# Patient Record
Sex: Female | Born: 1940
Health system: Southern US, Community
[De-identification: ages and names within clinical notes are randomized; demographics above are authoritative.]

## PROBLEM LIST (undated history)

## (undated) DIAGNOSIS — I10 Essential (primary) hypertension: Secondary | ICD-10-CM

## (undated) DIAGNOSIS — B029 Zoster without complications: Secondary | ICD-10-CM

## (undated) DIAGNOSIS — F329 Major depressive disorder, single episode, unspecified: Secondary | ICD-10-CM

## (undated) DIAGNOSIS — F419 Anxiety disorder, unspecified: Secondary | ICD-10-CM

## (undated) DIAGNOSIS — I700 Atherosclerosis of aorta: Secondary | ICD-10-CM

## (undated) DIAGNOSIS — F0390 Unspecified dementia without behavioral disturbance: Secondary | ICD-10-CM

## (undated) DIAGNOSIS — D649 Anemia, unspecified: Secondary | ICD-10-CM

## (undated) DIAGNOSIS — Z8701 Personal history of pneumonia (recurrent): Secondary | ICD-10-CM

## (undated) DIAGNOSIS — R079 Chest pain, unspecified: Secondary | ICD-10-CM

## (undated) DIAGNOSIS — F32A Depression, unspecified: Secondary | ICD-10-CM

## (undated) DIAGNOSIS — K219 Gastro-esophageal reflux disease without esophagitis: Secondary | ICD-10-CM

## (undated) DIAGNOSIS — J42 Unspecified chronic bronchitis: Secondary | ICD-10-CM

## (undated) DIAGNOSIS — M1990 Unspecified osteoarthritis, unspecified site: Secondary | ICD-10-CM

## (undated) DIAGNOSIS — E785 Hyperlipidemia, unspecified: Secondary | ICD-10-CM

## (undated) DIAGNOSIS — Z6841 Body Mass Index (BMI) 40.0 and over, adult: Secondary | ICD-10-CM

## (undated) HISTORY — DX: Body Mass Index (BMI) 40.0 and over, adult: Z684

## (undated) HISTORY — DX: Morbid (severe) obesity due to excess calories: E66.01

## (undated) HISTORY — PX: COLONOSCOPY: SHX5424

## (undated) HISTORY — PX: CATARACT EXTRACTION, BILATERAL: SHX1313

## (undated) HISTORY — PX: CARPAL TUNNEL RELEASE: SHX101

## (undated) HISTORY — PX: OTHER SURGICAL HISTORY: SHX169

## (undated) HISTORY — DX: Personal history of pneumonia (recurrent): Z87.01

## (undated) HISTORY — PX: TUBAL LIGATION: SHX77

## (undated) HISTORY — PX: WISDOM TOOTH EXTRACTION: SHX21

## (undated) SURGERY — Surgical Case
Anesthesia: *Unknown

---

## 1995-04-14 HISTORY — PX: OTHER SURGICAL HISTORY: SHX169

## 1996-04-20 HISTORY — PX: CARDIAC CATHETERIZATION: SHX172

## 1998-11-04 ENCOUNTER — Ambulatory Visit (HOSPITAL_COMMUNITY): Admission: RE | Admit: 1998-11-04 | Discharge: 1998-11-04 | Payer: Self-pay | Admitting: Emergency Medicine

## 1998-11-04 ENCOUNTER — Encounter: Payer: Self-pay | Admitting: Emergency Medicine

## 1998-11-11 ENCOUNTER — Ambulatory Visit (HOSPITAL_COMMUNITY): Admission: RE | Admit: 1998-11-11 | Discharge: 1998-11-11 | Payer: Self-pay | Admitting: Emergency Medicine

## 1998-11-11 ENCOUNTER — Encounter: Payer: Self-pay | Admitting: Emergency Medicine

## 1999-11-12 ENCOUNTER — Encounter: Payer: Self-pay | Admitting: *Deleted

## 1999-11-12 ENCOUNTER — Ambulatory Visit (HOSPITAL_COMMUNITY): Admission: RE | Admit: 1999-11-12 | Discharge: 1999-11-12 | Payer: Self-pay | Admitting: *Deleted

## 2000-11-12 ENCOUNTER — Encounter: Payer: Self-pay | Admitting: Emergency Medicine

## 2000-11-12 ENCOUNTER — Ambulatory Visit (HOSPITAL_COMMUNITY): Admission: RE | Admit: 2000-11-12 | Discharge: 2000-11-12 | Payer: Self-pay | Admitting: Emergency Medicine

## 2001-02-16 ENCOUNTER — Ambulatory Visit (HOSPITAL_COMMUNITY): Admission: RE | Admit: 2001-02-16 | Discharge: 2001-02-16 | Payer: Self-pay | Admitting: *Deleted

## 2001-04-13 DIAGNOSIS — Z8701 Personal history of pneumonia (recurrent): Secondary | ICD-10-CM

## 2001-04-13 HISTORY — DX: Personal history of pneumonia (recurrent): Z87.01

## 2001-11-11 HISTORY — PX: KNEE SURGERY: SHX244

## 2001-11-16 ENCOUNTER — Ambulatory Visit (HOSPITAL_BASED_OUTPATIENT_CLINIC_OR_DEPARTMENT_OTHER): Admission: RE | Admit: 2001-11-16 | Discharge: 2001-11-16 | Payer: Self-pay | Admitting: Orthopedic Surgery

## 2003-03-09 ENCOUNTER — Ambulatory Visit (HOSPITAL_COMMUNITY): Admission: RE | Admit: 2003-03-09 | Discharge: 2003-03-09 | Payer: Self-pay | Admitting: Emergency Medicine

## 2005-03-02 ENCOUNTER — Emergency Department (HOSPITAL_COMMUNITY): Admission: EM | Admit: 2005-03-02 | Discharge: 2005-03-02 | Payer: Self-pay | Admitting: Emergency Medicine

## 2008-05-23 ENCOUNTER — Emergency Department (HOSPITAL_COMMUNITY): Admission: EM | Admit: 2008-05-23 | Discharge: 2008-05-23 | Payer: Self-pay | Admitting: Emergency Medicine

## 2008-09-12 ENCOUNTER — Ambulatory Visit: Payer: Self-pay | Admitting: Vascular Surgery

## 2009-04-30 ENCOUNTER — Ambulatory Visit (HOSPITAL_COMMUNITY): Admission: RE | Admit: 2009-04-30 | Discharge: 2009-04-30 | Payer: Self-pay | Admitting: Emergency Medicine

## 2009-07-22 ENCOUNTER — Encounter: Admission: RE | Admit: 2009-07-22 | Discharge: 2009-07-22 | Payer: Self-pay | Admitting: Emergency Medicine

## 2009-10-11 HISTORY — PX: REPLACEMENT TOTAL KNEE: SUR1224

## 2009-11-06 ENCOUNTER — Inpatient Hospital Stay (HOSPITAL_COMMUNITY): Admission: RE | Admit: 2009-11-06 | Discharge: 2009-11-11 | Payer: Self-pay | Admitting: Orthopedic Surgery

## 2010-05-01 ENCOUNTER — Ambulatory Visit (HOSPITAL_COMMUNITY)
Admission: RE | Admit: 2010-05-01 | Discharge: 2010-05-01 | Payer: Self-pay | Source: Home / Self Care | Attending: Emergency Medicine | Admitting: Emergency Medicine

## 2010-06-27 LAB — PROTIME-INR: Prothrombin Time: 23.9 seconds — ABNORMAL HIGH (ref 11.6–15.2)

## 2010-06-28 LAB — PROTIME-INR
INR: 1.07 (ref 0.00–1.49)
INR: 1.6 — ABNORMAL HIGH (ref 0.00–1.49)
INR: 1.61 — ABNORMAL HIGH (ref 0.00–1.49)
INR: 2 — ABNORMAL HIGH (ref 0.00–1.49)
Prothrombin Time: 13.8 seconds (ref 11.6–15.2)
Prothrombin Time: 18.9 seconds — ABNORMAL HIGH (ref 11.6–15.2)
Prothrombin Time: 19 seconds — ABNORMAL HIGH (ref 11.6–15.2)

## 2010-06-28 LAB — BASIC METABOLIC PANEL
BUN: 11 mg/dL (ref 6–23)
CO2: 26 mEq/L (ref 19–32)
CO2: 30 mEq/L (ref 19–32)
CO2: 33 mEq/L — ABNORMAL HIGH (ref 19–32)
Calcium: 7.3 mg/dL — ABNORMAL LOW (ref 8.4–10.5)
Calcium: 8 mg/dL — ABNORMAL LOW (ref 8.4–10.5)
Chloride: 102 mEq/L (ref 96–112)
Creatinine, Ser: 0.76 mg/dL (ref 0.4–1.2)
GFR calc Af Amer: >60 mL/min (ref >60)
GFR calc Af Amer: >60 mL/min (ref >60)
GFR calc Af Amer: >60 mL/min (ref >60)
GFR calc non Af Amer: >60 mL/min (ref >60)
GFR calc non Af Amer: >60 mL/min (ref >60)
Glucose, Bld: 116 mg/dL — ABNORMAL HIGH (ref 70–99)
Glucose, Bld: 557 mg/dL (ref 70–99)
Potassium: 3.6 mEq/L (ref 3.5–5.1)
Potassium: 3.9 mEq/L (ref 3.5–5.1)
Sodium: 130 mEq/L — ABNORMAL LOW (ref 135–145)
Sodium: 137 mEq/L (ref 135–145)
Sodium: 140 mEq/L (ref 135–145)

## 2010-06-28 LAB — DIFFERENTIAL
Basophils Absolute: 0.1 10*3/uL (ref 0.0–0.1)
Basophils Relative: 1 % (ref 0–1)
Eosinophils Absolute: 0.2 10*3/uL (ref 0.0–0.7)
Eosinophils Relative: 2 % (ref 0–5)
Neutro Abs: 7.5 10*3/uL (ref 1.7–7.7)
Neutrophils Relative %: 66 % (ref 43–77)

## 2010-06-28 LAB — CBC
HCT: 28.4 % — ABNORMAL LOW (ref 36.0–46.0)
HCT: 28.6 % — ABNORMAL LOW (ref 36.0–46.0)
Hemoglobin: 12.5 g/dL (ref 12.0–15.0)
Hemoglobin: 9.5 g/dL — ABNORMAL LOW (ref 12.0–15.0)
MCH: 29.7 pg (ref 26.0–34.0)
MCH: 29.9 pg (ref 26.0–34.0)
MCH: 29.9 pg (ref 26.0–34.0)
MCH: 30 pg (ref 26.0–34.0)
MCV: 89.9 fL (ref 78.0–100.0)
MCV: 90.4 fL (ref 78.0–100.0)
MCV: 90.4 fL (ref 78.0–100.0)
Platelets: 206 10*3/uL (ref 150–400)
Platelets: 311 10*3/uL (ref 150–400)
RBC: 3.16 MIL/uL — ABNORMAL LOW (ref 3.87–5.11)
RBC: 3.18 MIL/uL — ABNORMAL LOW (ref 3.87–5.11)
RDW: 14.8 % (ref 11.5–15.5)
RDW: 15.4 % (ref 11.5–15.5)
WBC: 13.5 10*3/uL — ABNORMAL HIGH (ref 4.0–10.5)

## 2010-06-28 LAB — COMPREHENSIVE METABOLIC PANEL
ALT: 15 U/L (ref 0–35)
Albumin: 4 g/dL (ref 3.5–5.2)
CO2: 30 mEq/L (ref 19–32)
Calcium: 10.1 mg/dL (ref 8.4–10.5)
Chloride: 99 mEq/L (ref 96–112)
GFR calc Af Amer: >60 mL/min (ref >60)
Potassium: 4.1 mEq/L (ref 3.5–5.1)
Sodium: 140 mEq/L (ref 135–145)

## 2010-06-28 LAB — URINALYSIS, ROUTINE W REFLEX MICROSCOPIC
Glucose, UA: NEGATIVE mg/dL
Nitrite: NEGATIVE
Protein, ur: NEGATIVE mg/dL
pH: 8.5 — ABNORMAL HIGH (ref 5.0–8.0)

## 2010-06-28 LAB — ABO/RH: ABO/RH(D): O POS

## 2010-06-28 LAB — GLUCOSE, CAPILLARY
Glucose-Capillary: 106 mg/dL — ABNORMAL HIGH (ref 70–99)
Glucose-Capillary: 116 mg/dL — ABNORMAL HIGH (ref 70–99)
Glucose-Capillary: 123 mg/dL — ABNORMAL HIGH (ref 70–99)

## 2010-06-28 LAB — APTT: aPTT: 28 seconds (ref 24–37)

## 2010-06-28 LAB — TYPE AND SCREEN: ABO/RH(D): O POS

## 2010-06-28 LAB — SURGICAL PCR SCREEN: Staphylococcus aureus: NEGATIVE

## 2010-08-26 NOTE — Consult Note (Signed)
NEW PATIENT CONSULTATION   [name], [name]  DOB:  1940/10/21                                       09/12/2008  EAVWU#:[id number]   [name] presents today for evaluation of lower extremity  discomfort.  She is a very pleasant 70 year old black female who reports  discomfort both lower extremities.  She reports that she has a numbness  and stinging. This began in her right hip extending down into her legs  and also both anterior thighs and also over her right lateral calf.  She  has seen Dr. [name] before with diagnosis of a sciatic irritation  in her right leg.  She does not have any history of deep venous  thrombosis and does not have any venous varicosities.  She has a few  scattered spider vein telangiectasia.  She does have some swelling at  night.  She reports that this can be positional and have to __________  at night due to numbness in both legs.   PAST MEDICAL HISTORY:  Her past history is significant for hypertension,  elevated cholesterol.  She is not diabetic.   SOCIAL HISTORY:  She is widowed.  She has six children.  She smokes two  cigarettes a day and does not drink alcohol.   REVIEW OF SYSTEMS:  Her weight is reported at 269 pounds.  She is 5 feet  tall.  She denies cardiac or pulmonary difficulties.  She does have  chronic abdominal pain and arthritic joint pain.   ALLERGIES TO MEDICATIONS:  None.   CURRENT MEDICATIONS:  1. Pravastatin.  2. Metoprolol.  3. Lisinopril.  4. Furosemide.  5. Iron tablets.  6. Aspirin 81 mg daily.   PHYSICAL EXAMINATION:  GENERAL:  A well-developed, obese black female  appearing stated age of 13.  Her radial and posterior tibial pulses are 2+ bilaterally.  She does not  have any evidence of varicose veins.  Her legs are quite hard due to her  obesity.   RADIOGRAPHIC:  She underwent a screening handheld duplex by myself and  this shows normal saphenous veins bilaterally with no evidence of  reflux.   ASSESSMENT AND PLAN:  I discussed this with [name].  She does not  appear to have any significant venous to pathology.  I explained that  her symptoms do not go well with venous or arterial pathology and it  appears to be more than neuropathic.  She has tried compression garments  in the past but admits they have been difficult to for her to wear and  since she does not have any evidence of significant reflux, I would not  recommend persistence with this.  She was reassured with the discussion  and will see [place] again on an as-needed basis   Larina Earthly, M.D.  Electronically Signed   TFE/MEDQ  D:  09/12/2008  T:  09/13/2008  Job:  [id number]   cc:   Brett Canales A. Cleta Alberts, M.D.  Harvie Junior, M.D.

## 2010-08-29 NOTE — Op Note (Signed)
NAME:  [name], [name] NO.:  [id number]   MEDICAL RECORD NO.:  [id number]                    PATIENT TYPE:   LOCATION:                                       FACILITY:   PHYSICIAN:  Harvie Junior, M.D.                DATE OF BIRTH:   DATE OF PROCEDURE:  11/16/2001  DATE OF DISCHARGE:                                 OPERATIVE REPORT   PREOPERATIVE DIAGNOSES:  Degenerative joint disease with medial side pain.   POSTOPERATIVE DIAGNOSES:  1. Degenerative joint disease with medial side pain.  2. Anterior horn lateral meniscal tear.  3. Degenerative joint disease of the patellofemoral joint.   PRINCIPAL PROCEDURE:  1. Partial lateral meniscectomy.  2. Debridement of medial femoral condyle.  3. Debridement of patella and femoral trochlea.   SURGEON:  Dr. Luiz Blare.   ASSISTANT:  Currie Paris. Thedore Mins.   ANESTHESIA:  General.   BRIEF HISTORY:  This is a 70 year old female with a long history of having  had significant degenerative joint disease. She previously had a knee  arthroscopy which showed that she had some degenerative disease. She  ultimately continued to have pain. We did injection therapy and that helped  but did not eliminate her pain. Because of continued complaints of pain, the  patient was ultimately taken to the operating room for operative knee  arthroscopy.   DESCRIPTION OF PROCEDURE:  The patient was taken to the operating room and  after adequate anesthesia was obtained with general anesthetic, the patient  was placed supine on the operating table. The leg was examined under  anesthesia and noted to be stable in all directions. At this point, the leg  was prepped and draped in the usual sterile fashion. Routine orthopedic  examination of the knee revealed that there was an obvious chondral defect  on the medial femoral condyle. This was debrided back to a smooth and stable  rim. It was a grade 4 defect with significant amounts of  exposed bone.  Attention then turned to the medial meniscus which was  without significant  abnormality. Attention was then turned to the anterior cruciate which was  normal. Attention was turned laterally. There was a small anterolateral  meniscal tear but the lateral femoral condyle was within normal limits.  Attention was then turned to the patellofemoral joint where there was noted  to be grade 3 and 4 changes throughout the femoral trochlea as well as on  the undersurface of the patella. The trochlear lesion did communicate with  the medial femoral condylar lesion thus making it sort of  a large grade 4  area. At this point following debridement of all these surfaces, the knee  was copiously irrigated and suctioned dry. A final check was made for any  loose or fragmenting pieces finding  none. The knee was then injected with 80 mg of Depo-Medrol and  the portals  were closed with a 4-0 nylon interrupted suture. A sterile compressive  dressing was applied, the patient taken to the recovery room where she was  noted to be in satisfactory condition.                                                  Harvie Junior, M.D.    Ranae Plumber  D:  11/16/2001  T:  11/19/2001  Job:  [id number]

## 2010-10-16 ENCOUNTER — Other Ambulatory Visit: Payer: Self-pay | Admitting: Orthopedic Surgery

## 2010-10-16 DIAGNOSIS — M25512 Pain in left shoulder: Secondary | ICD-10-CM

## 2010-10-16 DIAGNOSIS — M25511 Pain in right shoulder: Secondary | ICD-10-CM

## 2010-10-20 ENCOUNTER — Ambulatory Visit
Admission: RE | Admit: 2010-10-20 | Discharge: 2010-10-20 | Disposition: A | Discharge status: Home / Self Care | Payer: Medicare Other | Source: Ambulatory Visit | Attending: Orthopedic Surgery | Admitting: Orthopedic Surgery

## 2010-10-20 DIAGNOSIS — M25511 Pain in right shoulder: Secondary | ICD-10-CM

## 2010-10-20 DIAGNOSIS — M25512 Pain in left shoulder: Secondary | ICD-10-CM

## 2011-03-24 ENCOUNTER — Encounter (HOSPITAL_COMMUNITY): Payer: Self-pay

## 2011-03-25 ENCOUNTER — Other Ambulatory Visit: Payer: Self-pay | Admitting: Obstetrics and Gynecology

## 2011-03-30 NOTE — Patient Instructions (Addendum)
   Your procedure is scheduled on: Thursday, Dec 20th  Enter through the [employer] of [facility] at: 11:30am Pick up the phone at the desk and dial [phone] and inform us of your arrival.  Please call this number if you have any problems the morning of surgery: [phone]  Remember: Do not eat food after midnight: Wednesday Do not drink clear liquids after: 9:00am Thursday Take these medicines the morning of surgery with a SIP OF WATER: per anesthesia instruction - prior to 9am  Do not wear jewelry, make-up, or FINGER nail polish Do not wear lotions, powders, perfumes or deodorant. Do not shave 48 hours prior to surgery. Do not bring valuables to the hospital.  Patients discharged on the day of surgery will not be allowed to drive home.   Home with Sister-in-law Tonna Boehringer   Remember to use your hibiclens as instructed.Please shower with 1/2 bottle the evening before your surgery and the other 1/2 bottle the morning of surgery.

## 2011-03-31 ENCOUNTER — Encounter (HOSPITAL_COMMUNITY): Payer: Self-pay

## 2011-03-31 ENCOUNTER — Other Ambulatory Visit: Payer: Self-pay

## 2011-03-31 ENCOUNTER — Encounter (HOSPITAL_COMMUNITY)
Admission: RE | Admit: 2011-03-31 | Discharge: 2011-03-31 | Disposition: A | Discharge status: Home / Self Care | Payer: MEDICARE | Source: Ambulatory Visit | Attending: Obstetrics and Gynecology | Admitting: Obstetrics and Gynecology

## 2011-03-31 HISTORY — DX: Anxiety disorder, unspecified: F41.9

## 2011-03-31 HISTORY — DX: Hyperlipidemia, unspecified: E78.5

## 2011-03-31 HISTORY — DX: Essential (primary) hypertension: I10

## 2011-03-31 HISTORY — DX: Depression, unspecified: F32.A

## 2011-03-31 HISTORY — DX: Unspecified osteoarthritis, unspecified site: M19.90

## 2011-03-31 HISTORY — DX: Anemia, unspecified: D64.9

## 2011-03-31 HISTORY — DX: Major depressive disorder, single episode, unspecified: F32.9

## 2011-03-31 LAB — CBC
HCT: 38.4 % (ref 36.0–46.0)
Platelets: 303 10*3/uL (ref 150–400)
RBC: 4.1 MIL/uL (ref 3.87–5.11)
RDW: 14.9 % (ref 11.5–15.5)
WBC: 8.8 10*3/uL (ref 4.0–10.5)

## 2011-03-31 LAB — COMPREHENSIVE METABOLIC PANEL
AST: 19 U/L (ref 0–37)
Albumin: 3.7 g/dL (ref 3.5–5.2)
Alkaline Phosphatase: 82 U/L (ref 39–117)
Chloride: 103 mEq/L (ref 96–112)
Potassium: 4.8 mEq/L (ref 3.5–5.1)
Total Bilirubin: 0.4 mg/dL (ref 0.3–1.2)

## 2011-03-31 NOTE — Pre-Procedure Instructions (Signed)
Reviewed patient's history with Dr Dana Allan. Per Dr Rodman Pickle, patient to take all morning meds on DOS (prior to 9am) with small sip of water except Iron pill. Patient verbalized understanding.

## 2011-04-02 NOTE — H&P (Signed)
[name], [name]                ACCOUNT NO.:  [id number]  MEDICAL RECORD NO.:  [id number]  LOCATION:  [facility]                         FACILITY:  [facility]  PHYSICIAN:  Lenoard Aden, M.D.DATE OF BIRTH:  02/08/1941  DATE OF ADMISSION:  03/03/2011 DATE OF DISCHARGE:                             HISTORY & PHYSICAL   CHIEF COMPLAINT:  Postmenopausal bleeding with structural lesion.  HISTORY OF PRESENT ILLNESS:  This is a 70 year old African American female, G7, P7, who presents now with postmenopausal bleeding and a polyp on sonohysterogram, consistent with an endometrial polyp.  She had a normal endometrial biopsy on January 23, 2011.  MEDICATIONS:  Include Cipro, lorazepam, tramadol, Lasix, Vytorin, Benicar, baby aspirin, metoprolol, calcium with vitamin D.  PAST SURGICAL HISTORY:  She has a history of 7 vaginal deliveries.  She has a history of knee surgery x2.  MEDICAL HISTORY:  Also, remarkable for hypertension.  FAMILY HISTORY:  Heart disease and hypertension.  PHYSICAL EXAMINATION:  GENERAL:  The patient is a well-developed, well- nourished [place] American female. VITAL SIGNS:  Height of 59 inches, weight of 223 pounds. HEENT:  Normal. NECK:  Supple.  Full range of motion. LUNGS:  Clear. HEART:  Regular rate and rhythm. ABDOMEN:  Soft, nontender. PELVIC:  Reveals a globular uterus which is antiflexed and no adnexal masses. EXTREMITIES:  There are no cords. NEUROLOGIC: Nonfocal. SKIN:  Intact.  IMPRESSION:  Postmenopausal bleeding with structural lesion.  PLAN:  Proceed with diagnostic hysteroscopy.  Risks of anesthesia, infection, bleeding, injury to abdominal organs, need for repair is discussed.  Delayed versus immediate complications to include bowel or bladder injury noted.  The patient acknowledges and wishes to proceed.     Lenoard Aden, M.D.     RJT/MEDQ  D:  04/02/2011  T:  04/02/2011  Job:  [id number]

## 2011-04-03 ENCOUNTER — Ambulatory Visit (HOSPITAL_COMMUNITY)
Admission: RE | Admit: 2011-04-03 | Discharge: 2011-04-03 | Disposition: A | Discharge status: Home / Self Care | Payer: MEDICARE | Source: Ambulatory Visit | Attending: Obstetrics and Gynecology | Admitting: Obstetrics and Gynecology

## 2011-04-03 ENCOUNTER — Encounter (HOSPITAL_COMMUNITY)
Admission: RE | Disposition: A | Discharge status: Home / Self Care | Payer: Self-pay | Source: Ambulatory Visit | Attending: Obstetrics and Gynecology

## 2011-04-03 ENCOUNTER — Other Ambulatory Visit: Payer: Self-pay | Admitting: Obstetrics and Gynecology

## 2011-04-03 ENCOUNTER — Encounter (HOSPITAL_COMMUNITY): Payer: Self-pay | Admitting: Anesthesiology

## 2011-04-03 ENCOUNTER — Ambulatory Visit (HOSPITAL_COMMUNITY): Payer: MEDICARE | Admitting: Anesthesiology

## 2011-04-03 DIAGNOSIS — N950 Postmenopausal bleeding: Secondary | ICD-10-CM

## 2011-04-03 DIAGNOSIS — N840 Polyp of corpus uteri: Secondary | ICD-10-CM | POA: Insufficient documentation

## 2011-04-03 SURGERY — DILATATION & CURETTAGE/HYSTEROSCOPY WITH VERSAPOINT RESECTION
Anesthesia: Choice

## 2011-04-03 MED ORDER — LIDOCAINE HCL (CARDIAC) 20 MG/ML IV SOLN
INTRAVENOUS | Status: AC
  Filled 2011-04-03: qty 5

## 2011-04-03 MED ORDER — FENTANYL CITRATE 0.05 MG/ML IJ SOLN
INTRAMUSCULAR | Status: AC
  Filled 2011-04-03: qty 4

## 2011-04-03 MED ORDER — LIDOCAINE HCL (CARDIAC) 20 MG/ML IV SOLN
INTRAVENOUS | Status: DC | PRN
  Administered 2011-04-03: 100 mg via INTRAVENOUS

## 2011-04-03 MED ORDER — PROPOFOL 10 MG/ML IV EMUL
INTRAVENOUS | Status: DC | PRN
  Administered 2011-04-03: 180 mg via INTRAVENOUS

## 2011-04-03 MED ORDER — ONDANSETRON HCL 4 MG/2ML IJ SOLN
INTRAMUSCULAR | Status: DC | PRN
  Administered 2011-04-03: 4 mg via INTRAVENOUS

## 2011-04-03 MED ORDER — DEXAMETHASONE SODIUM PHOSPHATE 10 MG/ML IJ SOLN
INTRAMUSCULAR | Status: AC
  Filled 2011-04-03: qty 1

## 2011-04-03 MED ORDER — FENTANYL CITRATE 0.05 MG/ML IJ SOLN
INTRAMUSCULAR | Status: DC | PRN
  Administered 2011-04-03: 25 ug via INTRAVENOUS
  Administered 2011-04-03: 50 ug via INTRAVENOUS
  Administered 2011-04-03: 25 ug via INTRAVENOUS
  Administered 2011-04-03: 50 ug via INTRAVENOUS

## 2011-04-03 MED ORDER — FENTANYL CITRATE 0.05 MG/ML IJ SOLN: 25.0000 ug | INTRAMUSCULAR | Status: DC | PRN

## 2011-04-03 MED ORDER — DEXAMETHASONE SODIUM PHOSPHATE 4 MG/ML IJ SOLN
INTRAMUSCULAR | Status: DC | PRN
  Administered 2011-04-03: 10 mg via INTRAVENOUS

## 2011-04-03 MED ORDER — KETOROLAC TROMETHAMINE 30 MG/ML IJ SOLN
INTRAMUSCULAR | Status: AC
  Filled 2011-04-03: qty 1

## 2011-04-03 MED ORDER — VASOPRESSIN 20 UNIT/ML IJ SOLN
INTRAVENOUS | Status: DC | PRN
  Administered 2011-04-03: 13:00:00 via INTRAMUSCULAR

## 2011-04-03 MED ORDER — MIDAZOLAM HCL 2 MG/2ML IJ SOLN
INTRAMUSCULAR | Status: AC
  Filled 2011-04-03: qty 2

## 2011-04-03 MED ORDER — KETOROLAC TROMETHAMINE 30 MG/ML IJ SOLN
INTRAMUSCULAR | Status: DC | PRN
  Administered 2011-04-03: 30 mg via INTRAVENOUS

## 2011-04-03 MED ORDER — ONDANSETRON HCL 4 MG/2ML IJ SOLN
INTRAMUSCULAR | Status: AC
  Filled 2011-04-03: qty 2

## 2011-04-03 MED ORDER — MIDAZOLAM HCL 5 MG/5ML IJ SOLN
INTRAMUSCULAR | Status: DC | PRN
  Administered 2011-04-03: 2 mg via INTRAVENOUS

## 2011-04-03 MED ORDER — PROPOFOL 10 MG/ML IV EMUL
INTRAVENOUS | Status: AC
  Filled 2011-04-03: qty 20

## 2011-04-03 MED ORDER — BUPIVACAINE HCL (PF) 0.25 % IJ SOLN
INTRAMUSCULAR | Status: DC | PRN
  Administered 2011-04-03: 10 mL

## 2011-04-03 MED ORDER — LACTATED RINGERS IV SOLN
INTRAVENOUS | Status: DC
  Administered 2011-04-03 (×3): via INTRAVENOUS

## 2011-04-03 SURGICAL SUPPLY — 12 items
CANISTER SUCTION 2500CC (MISCELLANEOUS) ×2 IMPLANT
CATH ROBINSON RED A/P 16FR (CATHETERS) ×2 IMPLANT
CLOTH BEACON ORANGE TIMEOUT ST (SAFETY) ×2 IMPLANT
CONTAINER PREFILL 10% NBF 60ML (FORM) ×4 IMPLANT
ELECTRODE RT ANGLE VERSAPOINT (CUTTING LOOP) ×2 IMPLANT
GLOVE BIO SURGEON STRL SZ7.5 (GLOVE) ×4 IMPLANT
GOWN PREVENTION PLUS LG XLONG (DISPOSABLE) ×2 IMPLANT
GOWN STRL REIN XL XLG (GOWN DISPOSABLE) ×2 IMPLANT
PACK HYSTEROSCOPY LF (CUSTOM PROCEDURE TRAY) ×2 IMPLANT
SYR TB 1ML 25GX5/8 (SYRINGE) ×2 IMPLANT
TOWEL OR 17X24 6PK STRL BLUE (TOWEL DISPOSABLE) ×4 IMPLANT
WATER STERILE IRR 1000ML POUR (IV SOLUTION) ×2 IMPLANT

## 2011-04-03 NOTE — Progress Notes (Signed)
Patient ID: [name], female   DOB: Mar 23, 1941, 70 y.o.   MRN: [id number] No changes noted.  Pt examined. Update done. Consent done.

## 2011-04-03 NOTE — Anesthesia Postprocedure Evaluation (Signed)
  Anesthesia Post-op Note  Patient: [name]  Procedure(s) Performed:  DILATATION & CURETTAGE/HYSTEROSCOPY WITH VERSAPOINT RESECTION  Patient is awake and responsive. Pain and nausea are reasonably well controlled. Vital signs are stable and clinically acceptable. Oxygen saturation is clinically acceptable. There are no apparent anesthetic complications at this time. Patient is ready for discharge.

## 2011-04-03 NOTE — Op Note (Signed)
04/03/2011  1:34 PM  PATIENT:  [name]  70 y.o. female  PRE-OPERATIVE DIAGNOSIS:  Endometrial polyps, multiple  POST-OPERATIVE DIAGNOSIS:  Endometrial polyps  PROCEDURE:  Procedure(s): DILATATION & CURETTAGE/HYSTEROSCOPY WITH VERSAPOINT RESECTION  SURGEON:  Surgeon(s): Lenoard Aden, MD  ASSISTANTS: none   ANESTHESIA:   local and general  ESTIMATED BLOOD LOSS: * No blood loss amount entered *   DRAINS: none   LOCAL MEDICATIONS USED:  MARCAINE 20CC  SPECIMEN:  Source of Specimen:  EMC and polyps  DISPOSITION OF SPECIMEN:  PATHOLOGY  COUNTS:  YES  DICTATION #: [id number]  PLAN OF CARE: DC home  PATIENT DISPOSITION:  PACU - hemodynamically stable.

## 2011-04-03 NOTE — Anesthesia Procedure Notes (Signed)
Procedure Name: LMA Insertion Performed by: Erskine Speed Pre-anesthesia Checklist: Patient identified, Patient being monitored, Emergency Drugs available, Timeout performed and Suction available Patient Re-evaluated:Patient Re-evaluated prior to inductionOxygen Delivery Method: Circle System Utilized Preoxygenation: Pre-oxygenation with 100% oxygen Intubation Type: Combination inhalational/ intravenous induction Ventilation: Mask ventilation without difficulty LMA: LMA flexible inserted LMA Size: 4.0 Number of attempts: 1 Airway Equipment and Method: patient positioned with wedge pillow Placement Confirmation: breath sounds checked- equal and bilateral and positive ETCO2 Dental Injury: Teeth and Oropharynx as per pre-operative assessment

## 2011-04-03 NOTE — Transfer of Care (Signed)
Immediate Anesthesia Transfer of Care Note  Patient: [name]  Procedure(s) Performed:  DILATATION & CURETTAGE/HYSTEROSCOPY WITH VERSAPOINT RESECTION  Patient Location: PACU  Anesthesia Type: General  Level of Consciousness: awake, alert , oriented and patient cooperative  Airway & Oxygen Therapy: Patient Spontanous Breathing and Patient connected to nasal cannula oxygen  Post-op Assessment: Report given to PACU RN and Post -op Vital signs reviewed and stable  Post vital signs: Reviewed and stable  Complications: No apparent anesthesia complications

## 2011-04-03 NOTE — Anesthesia Preprocedure Evaluation (Signed)
Anesthesia Evaluation  Patient identified by MRN, date of birth, ID band Patient awake    Reviewed: Allergy & Precautions, H&P , NPO status , Patient's Chart, lab work & pertinent test results, reviewed documented beta blocker date and time   History of Anesthesia Complications Negative for: history of anesthetic complications  Airway Mallampati: I TM Distance: >3 FB Neck ROM: full    Dental  (+) Chipped and Missing,    Pulmonary neg pulmonary ROS,  clear to auscultation  Pulmonary exam normal       Cardiovascular Exercise Tolerance: Good hypertension, On Medications regular Normal Per pt, echo in January was good.   Neuro/Psych PSYCHIATRIC DISORDERS (anxiety/depression) Negative Neurological ROS     GI/Hepatic negative GI ROS, Neg liver ROS,   Endo/Other  Morbid obesity  Renal/GU negative Renal ROS  Genitourinary negative   Musculoskeletal   Abdominal   Peds  Hematology negative hematology ROS (+)   Anesthesia Other Findings   Reproductive/Obstetrics negative OB ROS                           Anesthesia Physical Anesthesia Plan  ASA: III  Anesthesia Plan: General LMA   Post-op Pain Management:    Induction:   Airway Management Planned:   Additional Equipment:   Intra-op Plan:   Post-operative Plan:   Informed Consent: I have reviewed the patients History and Physical, chart, labs and discussed the procedure including the risks, benefits and alternatives for the proposed anesthesia with the patient or authorized representative who has indicated his/her understanding and acceptance.   Dental Advisory Given  Plan Discussed with: CRNA and Surgeon  Anesthesia Plan Comments: (Discussed regional vs. General - patient prefers general.)        Anesthesia Quick Evaluation

## 2011-04-03 NOTE — Preoperative (Signed)
Beta Blockers   Reason not to administer Beta Blockers:Not Applicable 

## 2011-04-04 NOTE — Op Note (Signed)
NAMESARAE, NICHOLES                ACCOUNT NO.:  [id number]  MEDICAL RECORD NO.:  [id number]  LOCATION:  [facility]                          FACILITY:  [facility]  PHYSICIAN:  Lenoard Aden, M.D.DATE OF BIRTH:  Jan 04, 1941  DATE OF PROCEDURE:  04/03/2011 DATE OF DISCHARGE:  04/03/2011                              OPERATIVE REPORT   PREOPERATIVE DIAGNOSIS:  Postmenopausal bleeding with structural lesion.  SURGEON:  Lenoard Aden, MD  DESCRIPTION OF PROCEDURE:  After being apprised of risks of anesthesia, infection, bleeding, injury to abdominal organs, need for repair, delayed versus immediate complications to include bowel and bladder injury, possible need for repair, the patient was brought to the operating room.  She was administered general anesthetic without complications.  She was prepped and draped in the usual sterile fashion. Catheterized the bladder until it was empty.  Feet were placed in Yellofin stirrups.  Exam under anesthesia revealed a bulky mid positioned uterus, cystocele, rectocele, and grade 1-2 pelvic relaxation.  At this time, anterior lip of the cervix was grasped with dilute Pitressin solution, placed 18 mL total at 3 and 9 o'clock at the cervical vaginal junction.  Dilute paracervical block using dilute Marcaine solution 20 mL total in a standard fashion.  Cervix easily dilated up to a #31 Pratt dilator.  Hysteroscope placed.  Visualization reveals a large multilobulated collection of endometrial polyps along the posterior wall.  These were resected in multiple passes using the right angle VersaPoint loop.  Good hemostasis was noted.  After complete resection of dilute, the endometrial cavity appears empty.  D and C in a 4-quadrant method reveals cavity also to be empty.  Good hemostasis was noted.  Fluid deficit was minimal.  The patient tolerated the procedure well and was transferred to recovery in good condition.     Lenoard Aden,  M.D.     RJT/MEDQ  D:  04/03/2011  T:  04/04/2011  Job:  [id number]

## 2011-04-16 ENCOUNTER — Ambulatory Visit (INDEPENDENT_AMBULATORY_CARE_PROVIDER_SITE_OTHER): Payer: MEDICARE

## 2011-04-16 DIAGNOSIS — M549 Dorsalgia, unspecified: Secondary | ICD-10-CM

## 2011-04-16 DIAGNOSIS — Z23 Encounter for immunization: Secondary | ICD-10-CM | POA: Diagnosis not present

## 2011-04-16 DIAGNOSIS — N251 Nephrogenic diabetes insipidus: Secondary | ICD-10-CM

## 2011-04-21 DIAGNOSIS — M25569 Pain in unspecified knee: Secondary | ICD-10-CM | POA: Diagnosis not present

## 2011-04-23 DIAGNOSIS — I10 Essential (primary) hypertension: Secondary | ICD-10-CM | POA: Diagnosis not present

## 2011-04-23 DIAGNOSIS — E782 Mixed hyperlipidemia: Secondary | ICD-10-CM | POA: Diagnosis not present

## 2011-04-23 DIAGNOSIS — R609 Edema, unspecified: Secondary | ICD-10-CM | POA: Diagnosis not present

## 2011-04-23 DIAGNOSIS — R0602 Shortness of breath: Secondary | ICD-10-CM | POA: Diagnosis not present

## 2011-04-27 DIAGNOSIS — N950 Postmenopausal bleeding: Secondary | ICD-10-CM | POA: Diagnosis not present

## 2011-06-02 ENCOUNTER — Other Ambulatory Visit: Payer: Self-pay

## 2011-06-02 MED ORDER — LORAZEPAM 0.5 MG PO TABS: 0.5000 mg | ORAL_TABLET | Freq: Four times a day (QID) | ORAL | Status: DC | PRN

## 2011-06-02 NOTE — Telephone Encounter (Signed)
Called in RX to [facility]

## 2011-06-12 HISTORY — PX: TRANSTHORACIC ECHOCARDIOGRAM: SHX275

## 2011-06-18 DIAGNOSIS — R609 Edema, unspecified: Secondary | ICD-10-CM | POA: Diagnosis not present

## 2011-06-18 DIAGNOSIS — R011 Cardiac murmur, unspecified: Secondary | ICD-10-CM | POA: Diagnosis not present

## 2011-06-22 ENCOUNTER — Other Ambulatory Visit: Payer: Self-pay | Admitting: Emergency Medicine

## 2011-06-22 DIAGNOSIS — Z1231 Encounter for screening mammogram for malignant neoplasm of breast: Secondary | ICD-10-CM

## 2011-07-16 ENCOUNTER — Ambulatory Visit (HOSPITAL_COMMUNITY)
Admission: RE | Admit: 2011-07-16 | Discharge: 2011-07-16 | Disposition: A | Discharge status: Home / Self Care | Payer: Medicare Other | Source: Ambulatory Visit | Attending: Emergency Medicine | Admitting: Emergency Medicine

## 2011-07-16 DIAGNOSIS — Z1231 Encounter for screening mammogram for malignant neoplasm of breast: Secondary | ICD-10-CM | POA: Diagnosis not present

## 2011-07-17 ENCOUNTER — Ambulatory Visit: Payer: MEDICARE

## 2011-07-17 ENCOUNTER — Ambulatory Visit (INDEPENDENT_AMBULATORY_CARE_PROVIDER_SITE_OTHER): Payer: MEDICARE | Admitting: Emergency Medicine

## 2011-07-17 VITALS — BP 119/80 | HR 83 | Temp 98.3°F | Resp 20 | Ht 59.5 in | Wt 239.2 lb

## 2011-07-17 DIAGNOSIS — M545 Low back pain, unspecified: Secondary | ICD-10-CM

## 2011-07-17 DIAGNOSIS — M549 Dorsalgia, unspecified: Secondary | ICD-10-CM

## 2011-07-17 DIAGNOSIS — M25559 Pain in unspecified hip: Secondary | ICD-10-CM

## 2011-07-17 DIAGNOSIS — F411 Generalized anxiety disorder: Secondary | ICD-10-CM | POA: Diagnosis not present

## 2011-07-17 DIAGNOSIS — F419 Anxiety disorder, unspecified: Secondary | ICD-10-CM

## 2011-07-17 LAB — POCT UA - MICROSCOPIC ONLY
Casts, Ur, LPF, POC: NEGATIVE
Mucus, UA: NEGATIVE
Yeast, UA: NEGATIVE

## 2011-07-17 LAB — POCT CBC
HCT, POC: 39.5 % (ref 37.7–47.9)
Hemoglobin: 12.5 g/dL (ref 12.2–16.2)
Lymph, poc: 2 (ref 0.6–3.4)
MCH, POC: 28.7 pg (ref 27–31.2)
MCHC: 31.6 g/dL — AB (ref 31.8–35.4)
MCV: 90.7 fL (ref 80–97)
POC LYMPH PERCENT: 25.7 %L (ref 10–50)
RDW, POC: 15.7 %
WBC: 7.8 10*3/uL (ref 4.6–10.2)

## 2011-07-17 LAB — POCT URINALYSIS DIPSTICK
Blood, UA: NEGATIVE
Glucose, UA: NEGATIVE
Ketones, UA: NEGATIVE
Spec Grav, UA: <=1.005

## 2011-07-17 NOTE — Progress Notes (Signed)
  Subjective:    Patient ID: [name], female    DOB: 08-27-1940, 71 y.o.   MRN: [id number]  HPI patient presents with onset yesterday of severe pain in her back and hips which radiates around anteriorly. She has a recent postmenopausal bleeding and had a D&C which did not showing signs of endometrial cancer. She has a history of severe degenerative arthritis in her knees. She also is known have arthritic changes in her back.    Review of Systems she denies burning stinging pain on urination she denies fever or chills     Objective:   Physical Exam  Constitutional:       When I walked in and around patient was shivering trying to ask for a blanket.  Eyes: Pupils are equal, round, and reactive to light.  Neck: No tracheal deviation present. No thyromegaly present.  Cardiovascular: Normal rate and regular rhythm.   Pulmonary/Chest: No respiratory distress. She has no wheezes. She has no rales. She exhibits no tenderness.  Abdominal: She exhibits no distension and no mass. There is no tenderness. There is no rebound and no guarding.  Musculoskeletal:       There is tenderness over the lumbar spine. There is very limited range of motion of both hips.          Assessment & Plan:  We'll check CBC urine and x-rays of her back and both hips we sent patient over to x-ray and then she got upset & man being sent by ambulance. She started thinking about her dead husband.: Had explosive diarrhea over the floor. We decided not to pursue the x-rays. We were able to clean her out and she seemed to calm down over time. Her CBC and urine were both normal and she was afebrile here

## 2011-07-18 ENCOUNTER — Telehealth: Payer: Self-pay | Admitting: Radiology

## 2011-07-21 ENCOUNTER — Other Ambulatory Visit: Payer: Self-pay | Admitting: Emergency Medicine

## 2011-07-21 DIAGNOSIS — F419 Anxiety disorder, unspecified: Secondary | ICD-10-CM

## 2011-07-21 MED ORDER — LORAZEPAM 0.5 MG PO TABS: 0.5000 mg | ORAL_TABLET | Freq: Three times a day (TID) | ORAL | Status: DC | PRN

## 2011-07-27 DIAGNOSIS — R609 Edema, unspecified: Secondary | ICD-10-CM | POA: Diagnosis not present

## 2011-07-27 DIAGNOSIS — I10 Essential (primary) hypertension: Secondary | ICD-10-CM | POA: Diagnosis not present

## 2011-07-27 DIAGNOSIS — Z01810 Encounter for preprocedural cardiovascular examination: Secondary | ICD-10-CM | POA: Diagnosis not present

## 2011-07-29 ENCOUNTER — Other Ambulatory Visit: Payer: Self-pay

## 2011-07-29 DIAGNOSIS — F419 Anxiety disorder, unspecified: Secondary | ICD-10-CM

## 2011-07-29 MED ORDER — LORAZEPAM 0.5 MG PO TABS: 0.5000 mg | ORAL_TABLET | Freq: Three times a day (TID) | ORAL | Status: DC | PRN

## 2011-07-30 DIAGNOSIS — M25569 Pain in unspecified knee: Secondary | ICD-10-CM | POA: Diagnosis not present

## 2011-07-30 DIAGNOSIS — M19019 Primary osteoarthritis, unspecified shoulder: Secondary | ICD-10-CM | POA: Diagnosis not present

## 2011-07-30 DIAGNOSIS — M1710 Unilateral primary osteoarthritis, unspecified knee: Secondary | ICD-10-CM | POA: Diagnosis not present

## 2011-09-29 ENCOUNTER — Other Ambulatory Visit: Payer: Self-pay

## 2011-09-29 DIAGNOSIS — F419 Anxiety disorder, unspecified: Secondary | ICD-10-CM

## 2011-09-29 MED ORDER — LORAZEPAM 0.5 MG PO TABS: 0.5000 mg | ORAL_TABLET | Freq: Three times a day (TID) | ORAL | Status: DC | PRN

## 2011-09-29 NOTE — Telephone Encounter (Signed)
Rx called in, Rx wouldn't print out

## 2011-11-23 DIAGNOSIS — M19019 Primary osteoarthritis, unspecified shoulder: Secondary | ICD-10-CM | POA: Diagnosis not present

## 2011-12-02 ENCOUNTER — Other Ambulatory Visit: Payer: Self-pay | Admitting: Emergency Medicine

## 2011-12-15 ENCOUNTER — Other Ambulatory Visit: Payer: Self-pay | Admitting: Internal Medicine

## 2011-12-15 NOTE — Telephone Encounter (Signed)
30 day only, needs OV for more?

## 2011-12-16 ENCOUNTER — Other Ambulatory Visit: Payer: Self-pay | Admitting: Orthopedic Surgery

## 2011-12-16 ENCOUNTER — Ambulatory Visit (INDEPENDENT_AMBULATORY_CARE_PROVIDER_SITE_OTHER): Payer: MEDICARE | Admitting: Emergency Medicine

## 2011-12-16 VITALS — BP 123/76 | HR 78 | Temp 98.6°F | Resp 16 | Ht 59.5 in | Wt 243.4 lb

## 2011-12-16 DIAGNOSIS — R300 Dysuria: Secondary | ICD-10-CM | POA: Diagnosis not present

## 2011-12-16 DIAGNOSIS — R309 Painful micturition, unspecified: Secondary | ICD-10-CM

## 2011-12-16 LAB — POCT UA - MICROSCOPIC ONLY
Casts, Ur, LPF, POC: NEGATIVE
Crystals, Ur, HPF, POC: NEGATIVE
Mucus, UA: NEGATIVE
RBC, urine, microscopic: NEGATIVE
Yeast, UA: NEGATIVE

## 2011-12-16 LAB — POCT URINALYSIS DIPSTICK
Bilirubin, UA: NEGATIVE
Blood, UA: NEGATIVE
Glucose, UA: NEGATIVE
Ketones, UA: NEGATIVE
Leukocytes, UA: NEGATIVE
Nitrite, UA: NEGATIVE
Protein, UA: NEGATIVE
Spec Grav, UA: 1.015
Urobilinogen, UA: 0.2
pH, UA: 7

## 2011-12-16 NOTE — Progress Notes (Signed)
  Subjective:    Patient ID: [name], female    DOB: 1940/06/23, 71 y.o.   MRN: [id number]  HPI issue here very concerned she has a problem in her kidneys. She is having no burning or stinging or pain on urination. She recently had surgery on her left knee and is recovering from a left knee replacement. She does walk with the assistance of a cane she uses on the right side.    Review of Systems     Objective:   Physical Exam there is significant tenderness over the left SI joint. There is no focal weakness elicited. There is well-healed scar over the left knee with good range of motion.    Results for orders placed in visit on 12/16/11  POCT UA - MICROSCOPIC ONLY      Component Value Range   WBC, Ur, HPF, POC 0-1     RBC, urine, microscopic neg     Bacteria, U Microscopic trace     Mucus, UA neg     Epithelial cells, urine per micros 0-1     Crystals, Ur, HPF, POC neg     Casts, Ur, LPF, POC neg     Yeast, UA neg    POCT URINALYSIS DIPSTICK      Component Value Range   Color, UA yellow     Clarity, UA clear     Glucose, UA neg     Bilirubin, UA neg     Ketones, UA neg     Spec Grav, UA 1.015     Blood, UA neg     pH, UA 7.0     Protein, UA neg     Urobilinogen, UA 0.2     Nitrite, UA neg     Leukocytes, UA Negative        Assessment & Plan:  I suspect her symptoms are related to her gait disturbance. She is walking with the assistance of a cane and is status post recent knee replacement. She is going to water aerobics and trying to lose weight and she does have a prescription for tramadol for pain she gets from her orthopedist. We will make no changes in his treatment program. She was reassured knowing her kidneys were doing okay

## 2012-01-01 ENCOUNTER — Other Ambulatory Visit: Payer: Self-pay | Admitting: Physician Assistant

## 2012-01-05 ENCOUNTER — Encounter (HOSPITAL_COMMUNITY): Payer: Self-pay | Admitting: Pharmacy Technician

## 2012-01-11 ENCOUNTER — Encounter (HOSPITAL_COMMUNITY): Payer: Self-pay

## 2012-01-11 ENCOUNTER — Encounter (HOSPITAL_COMMUNITY)
Admission: RE | Admit: 2012-01-11 | Discharge: 2012-01-11 | Disposition: A | Discharge status: Home / Self Care | Payer: Medicare Other | Source: Ambulatory Visit | Attending: Orthopedic Surgery | Admitting: Orthopedic Surgery

## 2012-01-11 DIAGNOSIS — Z01811 Encounter for preprocedural respiratory examination: Secondary | ICD-10-CM | POA: Diagnosis not present

## 2012-01-11 DIAGNOSIS — M19019 Primary osteoarthritis, unspecified shoulder: Secondary | ICD-10-CM | POA: Diagnosis not present

## 2012-01-11 LAB — CBC WITH DIFFERENTIAL/PLATELET
Basophils Absolute: 0 10*3/uL (ref 0.0–0.1)
Basophils Relative: 0 % (ref 0–1)
Eosinophils Absolute: 0.1 10*3/uL (ref 0.0–0.7)
Eosinophils Relative: 1 % (ref 0–5)
MCH: 29.3 pg (ref 26.0–34.0)
MCV: 93.8 fL (ref 78.0–100.0)
Neutrophils Relative %: 61 % (ref 43–77)
Platelets: 323 10*3/uL (ref 150–400)
RDW: 14.7 % (ref 11.5–15.5)
WBC: 8.8 10*3/uL (ref 4.0–10.5)

## 2012-01-11 LAB — COMPREHENSIVE METABOLIC PANEL
ALT: 9 U/L (ref 0–35)
AST: 20 U/L (ref 0–37)
Albumin: 4 g/dL (ref 3.5–5.2)
Calcium: 10.4 mg/dL (ref 8.4–10.5)
GFR calc Af Amer: >90 mL/min (ref >90)
Potassium: 4.3 mEq/L (ref 3.5–5.1)
Sodium: 139 mEq/L (ref 135–145)
Total Protein: 8.3 g/dL (ref 6.0–8.3)

## 2012-01-11 LAB — SURGICAL PCR SCREEN
MRSA, PCR: NEGATIVE
Staphylococcus aureus: NEGATIVE

## 2012-01-11 LAB — URINALYSIS, ROUTINE W REFLEX MICROSCOPIC
Hgb urine dipstick: NEGATIVE
Nitrite: NEGATIVE
Protein, ur: NEGATIVE mg/dL
Urobilinogen, UA: 0.2 mg/dL (ref 0.0–1.0)

## 2012-01-11 LAB — TYPE AND SCREEN
ABO/RH(D): O POS
Antibody Screen: NEGATIVE

## 2012-01-11 LAB — URINE MICROSCOPIC-ADD ON

## 2012-01-11 NOTE — Pre-Procedure Instructions (Signed)
20 [name]  01/11/2012   Your procedure is scheduled on:  October 7 MONDAY  Report to [facility] at 10:30 AM.  Call this number if you have problems the morning of surgery: [phone]   Remember:   Do not eat food OR LIQUIDS After Midnight.      Take these medicines the morning of surgery with A SIP OF WATER: ATIVAN, TRAMADOL   Do not wear jewelry, make-up or nail polish.  Do not wear lotions, powders, or perfumes. You may wear deodorant.  Do not shave 48 hours prior to surgery. Men may shave face and neck.  Do not bring valuables to the hospital.  Contacts, dentures or bridgework may not be worn into surgery.  Leave suitcase in the car. After surgery it may be brought to your room.  For patients admitted to the hospital, checkout time is 11:00 AM the day of discharge.   Patients discharged the day of surgery will not be allowed to drive home.  Name and phone number of your driver:   Special Instructions: Shower using CHG 2 nights before surgery and the night before surgery.  If you shower the day of surgery use CHG.  Use special wash - you have one bottle of CHG for all showers.  You should use approximately 1/3 of the bottle for each shower.   Please read over the following fact sheets that you were given: Pain Booklet, Coughing and Deep Breathing, Blood Transfusion Information, MRSA Information and Surgical Site Infection Prevention

## 2012-01-11 NOTE — Progress Notes (Signed)
Dr. Herbie Baltimore at [facility] heart is cardiologist. States last echo was 07/2011. Will request.

## 2012-01-15 ENCOUNTER — Other Ambulatory Visit: Payer: Self-pay | Admitting: Physician Assistant

## 2012-01-17 MED ORDER — CEFAZOLIN SODIUM-DEXTROSE 2-3 GM-% IV SOLR
2.0000 g | INTRAVENOUS | Status: AC
  Administered 2012-01-18: 2 g via INTRAVENOUS
  Filled 2012-01-17: qty 50

## 2012-01-18 ENCOUNTER — Inpatient Hospital Stay (HOSPITAL_COMMUNITY): Payer: Medicare Other | Admitting: Certified Registered"

## 2012-01-18 ENCOUNTER — Inpatient Hospital Stay (HOSPITAL_COMMUNITY): Payer: Medicare Other

## 2012-01-18 ENCOUNTER — Encounter (HOSPITAL_COMMUNITY)
Admission: RE | Disposition: A | Discharge status: Skilled Nursing Facility | Payer: Self-pay | Source: Ambulatory Visit | Attending: Orthopedic Surgery

## 2012-01-18 ENCOUNTER — Inpatient Hospital Stay (HOSPITAL_COMMUNITY)
Admission: RE | Admit: 2012-01-18 | Discharge: 2012-01-21 | DRG: 483 | Disposition: A | Discharge status: Skilled Nursing Facility | Payer: Medicare Other | Source: Ambulatory Visit | Attending: Orthopedic Surgery | Admitting: Orthopedic Surgery

## 2012-01-18 ENCOUNTER — Encounter (HOSPITAL_COMMUNITY): Payer: Self-pay | Admitting: Surgery

## 2012-01-18 ENCOUNTER — Encounter (HOSPITAL_COMMUNITY): Payer: Self-pay | Admitting: Certified Registered"

## 2012-01-18 DIAGNOSIS — F411 Generalized anxiety disorder: Secondary | ICD-10-CM | POA: Diagnosis present

## 2012-01-18 DIAGNOSIS — Z01812 Encounter for preprocedural laboratory examination: Secondary | ICD-10-CM

## 2012-01-18 DIAGNOSIS — D649 Anemia, unspecified: Secondary | ICD-10-CM | POA: Diagnosis present

## 2012-01-18 DIAGNOSIS — E785 Hyperlipidemia, unspecified: Secondary | ICD-10-CM | POA: Diagnosis present

## 2012-01-18 DIAGNOSIS — Z96659 Presence of unspecified artificial knee joint: Secondary | ICD-10-CM | POA: Diagnosis not present

## 2012-01-18 DIAGNOSIS — Z7982 Long term (current) use of aspirin: Secondary | ICD-10-CM

## 2012-01-18 DIAGNOSIS — Z87891 Personal history of nicotine dependence: Secondary | ICD-10-CM

## 2012-01-18 DIAGNOSIS — M6281 Muscle weakness (generalized): Secondary | ICD-10-CM | POA: Diagnosis not present

## 2012-01-18 DIAGNOSIS — D509 Iron deficiency anemia, unspecified: Secondary | ICD-10-CM | POA: Diagnosis not present

## 2012-01-18 DIAGNOSIS — Z79899 Other long term (current) drug therapy: Secondary | ICD-10-CM | POA: Diagnosis not present

## 2012-01-18 DIAGNOSIS — M25519 Pain in unspecified shoulder: Secondary | ICD-10-CM | POA: Diagnosis not present

## 2012-01-18 DIAGNOSIS — I10 Essential (primary) hypertension: Secondary | ICD-10-CM | POA: Diagnosis present

## 2012-01-18 DIAGNOSIS — F329 Major depressive disorder, single episode, unspecified: Secondary | ICD-10-CM | POA: Diagnosis present

## 2012-01-18 DIAGNOSIS — Z96619 Presence of unspecified artificial shoulder joint: Secondary | ICD-10-CM | POA: Diagnosis not present

## 2012-01-18 DIAGNOSIS — Z01818 Encounter for other preprocedural examination: Secondary | ICD-10-CM | POA: Diagnosis not present

## 2012-01-18 DIAGNOSIS — IMO0002 Reserved for concepts with insufficient information to code with codable children: Secondary | ICD-10-CM | POA: Diagnosis not present

## 2012-01-18 DIAGNOSIS — Z471 Aftercare following joint replacement surgery: Secondary | ICD-10-CM | POA: Diagnosis not present

## 2012-01-18 DIAGNOSIS — Z5189 Encounter for other specified aftercare: Secondary | ICD-10-CM | POA: Diagnosis not present

## 2012-01-18 DIAGNOSIS — G8918 Other acute postprocedural pain: Secondary | ICD-10-CM | POA: Diagnosis not present

## 2012-01-18 DIAGNOSIS — Z6841 Body Mass Index (BMI) 40.0 and over, adult: Secondary | ICD-10-CM

## 2012-01-18 DIAGNOSIS — M129 Arthropathy, unspecified: Secondary | ICD-10-CM | POA: Diagnosis not present

## 2012-01-18 DIAGNOSIS — M19019 Primary osteoarthritis, unspecified shoulder: Secondary | ICD-10-CM | POA: Diagnosis not present

## 2012-01-18 DIAGNOSIS — F3289 Other specified depressive episodes: Secondary | ICD-10-CM | POA: Diagnosis present

## 2012-01-18 DIAGNOSIS — M19012 Primary osteoarthritis, left shoulder: Secondary | ICD-10-CM

## 2012-01-18 HISTORY — PX: TOTAL SHOULDER ARTHROPLASTY: SHX126

## 2012-01-18 SURGERY — ARTHROPLASTY, SHOULDER, TOTAL
Anesthesia: Choice | Site: Shoulder | Laterality: Left | Wound class: Clean

## 2012-01-18 MED ORDER — ONDANSETRON HCL 4 MG PO TABS: 4.0000 mg | ORAL_TABLET | Freq: Four times a day (QID) | ORAL | Status: DC | PRN

## 2012-01-18 MED ORDER — ONDANSETRON HCL 4 MG/2ML IJ SOLN
INTRAMUSCULAR | Status: DC | PRN
  Administered 2012-01-18: 4 mg via INTRAVENOUS

## 2012-01-18 MED ORDER — MIDAZOLAM HCL 5 MG/5ML IJ SOLN
INTRAMUSCULAR | Status: DC | PRN
  Administered 2012-01-18: 2 mg via INTRAVENOUS

## 2012-01-18 MED ORDER — HEMOSTATIC AGENTS (NO CHARGE) OPTIME
TOPICAL | Status: DC | PRN
  Administered 2012-01-18: 1 via TOPICAL

## 2012-01-18 MED ORDER — GLYCOPYRROLATE 0.2 MG/ML IJ SOLN
INTRAMUSCULAR | Status: DC | PRN
  Administered 2012-01-18: 0.1 mg via INTRAVENOUS
  Administered 2012-01-18: 0.6 mg via INTRAVENOUS

## 2012-01-18 MED ORDER — FUROSEMIDE 20 MG PO TABS
20.0000 mg | ORAL_TABLET | Freq: Every day | ORAL | Status: DC
  Administered 2012-01-19 – 2012-01-21 (×3): 20 mg via ORAL
  Filled 2012-01-18 (×3): qty 1

## 2012-01-18 MED ORDER — HYDROCODONE-ACETAMINOPHEN 5-325 MG PO TABS
1.0000 | ORAL_TABLET | ORAL | Status: DC | PRN
  Administered 2012-01-19 – 2012-01-21 (×5): 2 via ORAL
  Filled 2012-01-18 (×5): qty 2

## 2012-01-18 MED ORDER — METOCLOPRAMIDE HCL 10 MG PO TABS: 5.0000 mg | ORAL_TABLET | Freq: Three times a day (TID) | ORAL | Status: DC | PRN

## 2012-01-18 MED ORDER — MENTHOL 3 MG MT LOZG: 1.0000 | LOZENGE | OROMUCOSAL | Status: DC | PRN

## 2012-01-18 MED ORDER — OXYCODONE-ACETAMINOPHEN 5-325 MG PO TABS
1.0000 | ORAL_TABLET | ORAL | Status: DC | PRN
  Administered 2012-01-18 – 2012-01-20 (×5): 2 via ORAL
  Filled 2012-01-18 (×5): qty 2

## 2012-01-18 MED ORDER — DEXTROSE 5 % IV SOLN
10.0000 mg | INTRAVENOUS | Status: DC | PRN
  Administered 2012-01-18: 10 ug/min via INTRAVENOUS

## 2012-01-18 MED ORDER — OXYCODONE HCL 5 MG PO TABS: 5.0000 mg | ORAL_TABLET | Freq: Once | ORAL | Status: DC | PRN

## 2012-01-18 MED ORDER — ALUM & MAG HYDROXIDE-SIMETH 200-200-20 MG/5ML PO SUSP: 30.0000 mL | ORAL | Status: DC | PRN

## 2012-01-18 MED ORDER — SENNOSIDES-DOCUSATE SODIUM 8.6-50 MG PO TABS: 1.0000 | ORAL_TABLET | Freq: Every evening | ORAL | Status: DC | PRN

## 2012-01-18 MED ORDER — MORPHINE SULFATE 2 MG/ML IJ SOLN
2.0000 mg | INTRAMUSCULAR | Status: DC | PRN
  Administered 2012-01-19: 2 mg via INTRAVENOUS
  Filled 2012-01-18: qty 1

## 2012-01-18 MED ORDER — OXYCODONE HCL 5 MG/5ML PO SOLN: 5.0000 mg | Freq: Once | ORAL | Status: DC | PRN

## 2012-01-18 MED ORDER — LACTATED RINGERS IV SOLN
INTRAVENOUS | Status: DC | PRN
  Administered 2012-01-18 (×2): via INTRAVENOUS

## 2012-01-18 MED ORDER — METOCLOPRAMIDE HCL 5 MG/ML IJ SOLN: 5.0000 mg | Freq: Three times a day (TID) | INTRAMUSCULAR | Status: DC | PRN

## 2012-01-18 MED ORDER — PHENOL 1.4 % MT LIQD: 1.0000 | OROMUCOSAL | Status: DC | PRN

## 2012-01-18 MED ORDER — LISINOPRIL 20 MG PO TABS
20.0000 mg | ORAL_TABLET | Freq: Every day | ORAL | Status: DC
  Administered 2012-01-19 – 2012-01-21 (×3): 20 mg via ORAL
  Filled 2012-01-18 (×5): qty 1

## 2012-01-18 MED ORDER — CEFAZOLIN SODIUM-DEXTROSE 2-3 GM-% IV SOLR
2.0000 g | Freq: Four times a day (QID) | INTRAVENOUS | Status: AC
  Administered 2012-01-18 – 2012-01-19 (×3): 2 g via INTRAVENOUS
  Filled 2012-01-18 (×3): qty 50

## 2012-01-18 MED ORDER — POVIDONE-IODINE 7.5 % EX SOLN
Freq: Once | CUTANEOUS | Status: DC
  Filled 2012-01-18: qty 118

## 2012-01-18 MED ORDER — SODIUM CHLORIDE 0.9 % IV SOLN
INTRAVENOUS | Status: DC
  Administered 2012-01-18 – 2012-01-19 (×2): via INTRAVENOUS

## 2012-01-18 MED ORDER — ACETAMINOPHEN 325 MG PO TABS: 650.0000 mg | ORAL_TABLET | Freq: Four times a day (QID) | ORAL | Status: DC | PRN

## 2012-01-18 MED ORDER — FLEET ENEMA 7-19 GM/118ML RE ENEM: 1.0000 | ENEMA | Freq: Once | RECTAL | Status: AC | PRN

## 2012-01-18 MED ORDER — DIPHENHYDRAMINE HCL 12.5 MG/5ML PO ELIX
12.5000 mg | ORAL_SOLUTION | ORAL | Status: DC | PRN
Start: 2012-01-18 — End: 2012-01-21

## 2012-01-18 MED ORDER — HYDROMORPHONE HCL PF 1 MG/ML IJ SOLN: 0.2500 mg | INTRAMUSCULAR | Status: DC | PRN

## 2012-01-18 MED ORDER — ROCURONIUM BROMIDE 100 MG/10ML IV SOLN
INTRAVENOUS | Status: DC | PRN
  Administered 2012-01-18: 10 mg via INTRAVENOUS
  Administered 2012-01-18: 50 mg via INTRAVENOUS
  Administered 2012-01-18 (×2): 10 mg via INTRAVENOUS

## 2012-01-18 MED ORDER — BUPIVACAINE-EPINEPHRINE PF 0.5-1:200000 % IJ SOLN
INTRAMUSCULAR | Status: DC | PRN
  Administered 2012-01-18: 30 mL

## 2012-01-18 MED ORDER — ZOLPIDEM TARTRATE 5 MG PO TABS
5.0000 mg | ORAL_TABLET | Freq: Every evening | ORAL | Status: DC | PRN
  Administered 2012-01-18 – 2012-01-20 (×2): 5 mg via ORAL
  Filled 2012-01-18 (×2): qty 1

## 2012-01-18 MED ORDER — FUROSEMIDE 20 MG PO TABS: 20.0000 mg | ORAL_TABLET | Freq: Every day | ORAL | Status: DC

## 2012-01-18 MED ORDER — LORAZEPAM 0.5 MG PO TABS
0.5000 mg | ORAL_TABLET | Freq: Three times a day (TID) | ORAL | Status: DC | PRN
  Administered 2012-01-21: 0.5 mg via ORAL
  Filled 2012-01-18: qty 1

## 2012-01-18 MED ORDER — NEOSTIGMINE METHYLSULFATE 1 MG/ML IJ SOLN
INTRAMUSCULAR | Status: DC | PRN
  Administered 2012-01-18: 4 mg via INTRAVENOUS

## 2012-01-18 MED ORDER — ONDANSETRON HCL 4 MG/2ML IJ SOLN: 4.0000 mg | Freq: Once | INTRAMUSCULAR | Status: DC | PRN

## 2012-01-18 MED ORDER — SENNA 8.6 MG PO TABS
1.0000 | ORAL_TABLET | Freq: Two times a day (BID) | ORAL | Status: DC
  Administered 2012-01-19 – 2012-01-21 (×5): 8.6 mg via ORAL
  Filled 2012-01-18 (×7): qty 1

## 2012-01-18 MED ORDER — SODIUM CHLORIDE 0.9 % IR SOLN
Status: DC | PRN
  Administered 2012-01-18: 1000 mL
  Administered 2012-01-18: 3000 mL

## 2012-01-18 MED ORDER — ONDANSETRON HCL 4 MG/2ML IJ SOLN: 4.0000 mg | Freq: Four times a day (QID) | INTRAMUSCULAR | Status: DC | PRN

## 2012-01-18 MED ORDER — LIDOCAINE HCL (CARDIAC) 20 MG/ML IV SOLN
INTRAVENOUS | Status: DC | PRN
  Administered 2012-01-18: 100 mg via INTRAVENOUS

## 2012-01-18 MED ORDER — ACETAMINOPHEN 650 MG RE SUPP: 650.0000 mg | Freq: Four times a day (QID) | RECTAL | Status: DC | PRN

## 2012-01-18 MED ORDER — ASPIRIN EC 325 MG PO TBEC
325.0000 mg | DELAYED_RELEASE_TABLET | Freq: Two times a day (BID) | ORAL | Status: DC
  Administered 2012-01-18 – 2012-01-21 (×5): 325 mg via ORAL
  Filled 2012-01-18 (×8): qty 1

## 2012-01-18 MED ORDER — FENTANYL CITRATE 0.05 MG/ML IJ SOLN
INTRAMUSCULAR | Status: DC | PRN
  Administered 2012-01-18 (×2): 50 ug via INTRAVENOUS
  Administered 2012-01-18: 100 ug via INTRAVENOUS
  Administered 2012-01-18: 50 ug via INTRAVENOUS

## 2012-01-18 MED ORDER — MEPERIDINE HCL 25 MG/ML IJ SOLN: 6.2500 mg | INTRAMUSCULAR | Status: DC | PRN

## 2012-01-18 MED ORDER — PROPOFOL 10 MG/ML IV BOLUS
INTRAVENOUS | Status: DC | PRN
  Administered 2012-01-18: 100 mg via INTRAVENOUS

## 2012-01-18 MED ORDER — SIMVASTATIN 20 MG PO TABS
20.0000 mg | ORAL_TABLET | Freq: Every day | ORAL | Status: DC
  Administered 2012-01-19 – 2012-01-20 (×2): 20 mg via ORAL
  Filled 2012-01-18 (×4): qty 1

## 2012-01-18 MED ORDER — BISACODYL 5 MG PO TBEC: 5.0000 mg | DELAYED_RELEASE_TABLET | Freq: Every day | ORAL | Status: DC | PRN

## 2012-01-18 SURGICAL SUPPLY — 81 items
ASSEMBLY NECK TAPER FIXED 135 (Orthopedic Implant) ×1 IMPLANT
BLADE SAW SAG 73X25 THK (BLADE) ×1
BLADE SAW SGTL 73X25 THK (BLADE) ×1 IMPLANT
BLADE SURG 15 STRL LF DISP TIS (BLADE) ×1 IMPLANT
BLADE SURG 15 STRL SS (BLADE) ×2
BOWL SMART MIX CTS (DISPOSABLE) IMPLANT
CEMENT BONE DEPUY (Cement) ×2 IMPLANT
CHLORAPREP W/TINT 26ML (MISCELLANEOUS) ×2 IMPLANT
CLOTH BEACON ORANGE TIMEOUT ST (SAFETY) ×2 IMPLANT
COVER SURGICAL LIGHT HANDLE (MISCELLANEOUS) ×2 IMPLANT
DRAPE INCISE IOBAN 66X45 STRL (DRAPES) ×2 IMPLANT
DRAPE PROXIMA HALF (DRAPES) ×1 IMPLANT
DRAPE SURG 17X23 STRL (DRAPES) ×2 IMPLANT
DRAPE U-SHAPE 47X51 STRL (DRAPES) ×2 IMPLANT
DRSG ADAPTIC 3X8 NADH LF (GAUZE/BANDAGES/DRESSINGS) ×2 IMPLANT
DRSG PAD ABDOMINAL 8X10 ST (GAUZE/BANDAGES/DRESSINGS) ×2 IMPLANT
ELECT BLADE 4.0 EZ CLEAN MEGAD (MISCELLANEOUS) ×2
ELECT CAUTERY BLADE 6.4 (BLADE) ×1 IMPLANT
ELECT REM PT RETURN 9FT ADLT (ELECTROSURGICAL) ×2
ELECTRODE BLDE 4.0 EZ CLN MEGD (MISCELLANEOUS) ×1 IMPLANT
ELECTRODE REM PT RTRN 9FT ADLT (ELECTROSURGICAL) ×1 IMPLANT
EVACUATOR 1/8 PVC DRAIN (DRAIN) ×1 IMPLANT
GLENOID ANCHOR PEG CROSSLK 44 (Orthopedic Implant) ×1 IMPLANT
GLOVE BIO SURGEON STRL SZ 6.5 (GLOVE) IMPLANT
GLOVE BIO SURGEON STRL SZ7 (GLOVE) ×1 IMPLANT
GLOVE BIO SURGEON STRL SZ7.5 (GLOVE) ×1 IMPLANT
GLOVE BIO SURGEON STRL SZ8 (GLOVE) ×1 IMPLANT
GLOVE BIOGEL PI IND STRL 7.0 (GLOVE) ×1 IMPLANT
GLOVE BIOGEL PI IND STRL 7.5 (GLOVE) IMPLANT
GLOVE BIOGEL PI IND STRL 8 (GLOVE) ×1 IMPLANT
GLOVE BIOGEL PI INDICATOR 7.0 (GLOVE) ×2
GLOVE BIOGEL PI INDICATOR 7.5 (GLOVE)
GLOVE BIOGEL PI INDICATOR 8 (GLOVE) ×5
GLOVE ECLIPSE 7.5 STRL STRAW (GLOVE) ×1 IMPLANT
GLOVE SURG ORTHO 7.0 STRL STRW (GLOVE) ×2 IMPLANT
GLOVE SURG SS PI 7.5 STRL IVOR (GLOVE) ×2 IMPLANT
GOWN BRE IMP SLV AUR LG STRL (GOWN DISPOSABLE) ×2 IMPLANT
GOWN PREVENTION PLUS LG XLONG (DISPOSABLE) ×1 IMPLANT
GOWN STRL REIN 3XL LVL4 (GOWN DISPOSABLE) ×1 IMPLANT
GOWN STRL REIN XL XLG (GOWN DISPOSABLE) ×5 IMPLANT
HANDPIECE INTERPULSE COAX TIP (DISPOSABLE) ×2
HEMOSTAT SURGICEL 2X14 (HEMOSTASIS) ×2 IMPLANT
HOOD PEEL AWAY FACE SHEILD DIS (HOOD) ×6 IMPLANT
KIT BASIN OR (CUSTOM PROCEDURE TRAY) ×2 IMPLANT
KIT ROOM TURNOVER OR (KITS) ×2 IMPLANT
MANIFOLD NEPTUNE II (INSTRUMENTS) ×2 IMPLANT
NDL MAYO TROCAR (NEEDLE) ×1 IMPLANT
NEEDLE HYPO 25GX1X1/2 BEV (NEEDLE) IMPLANT
NEEDLE MAYO TROCAR (NEEDLE) ×2 IMPLANT
NS IRRIG 1000ML POUR BTL (IV SOLUTION) ×2 IMPLANT
PACK SHOULDER (CUSTOM PROCEDURE TRAY) ×2 IMPLANT
PAD ARMBOARD 7.5X6 YLW CONV (MISCELLANEOUS) ×4 IMPLANT
PIN METAGLENE 2.5 (PIN) ×1 IMPLANT
SET HNDPC FAN SPRY TIP SCT (DISPOSABLE) ×1 IMPLANT
SLING ARM IMMOBILIZER LRG (SOFTGOODS) ×2 IMPLANT
SLING ARM IMMOBILIZER MED (SOFTGOODS) IMPLANT
SMARTMIX MINI TOWER (MISCELLANEOUS) ×2
SPONGE GAUZE 4X4 12PLY (GAUZE/BANDAGES/DRESSINGS) ×2 IMPLANT
SPONGE LAP 18X18 X RAY DECT (DISPOSABLE) ×2 IMPLANT
SPONGE LAP 4X18 X RAY DECT (DISPOSABLE) ×2 IMPLANT
STEM AP PC 8 (Stem) ×1 IMPLANT
STRIP CLOSURE SKIN 1/2X4 (GAUZE/BANDAGES/DRESSINGS) ×3 IMPLANT
SUCTION FRAZIER TIP 10 FR DISP (SUCTIONS) ×2 IMPLANT
SUPPORT WRAP ARM LG (MISCELLANEOUS) ×2 IMPLANT
SUT ETHIBOND 2 OS 4 DA (SUTURE) ×6 IMPLANT
SUT ETHIBOND NAB CT1 #1 30IN (SUTURE) ×2 IMPLANT
SUT MNCRL AB 4-0 PS2 18 (SUTURE) ×2 IMPLANT
SUT SILK 2 0 TIES 17X18 (SUTURE) ×2
SUT SILK 2-0 18XBRD TIE BLK (SUTURE) ×1 IMPLANT
SUT VIC AB 0 CTB1 27 (SUTURE) IMPLANT
SUT VIC AB 2-0 CT1 27 (SUTURE) ×4
SUT VIC AB 2-0 CT1 TAPERPNT 27 (SUTURE) ×1 IMPLANT
SYR CONTROL 10ML LL (SYRINGE) ×1 IMPLANT
SYRINGE TOOMEY DISP (SYRINGE) ×1 IMPLANT
TOWEL OR 17X24 6PK STRL BLUE (TOWEL DISPOSABLE) ×3 IMPLANT
TOWEL OR 17X26 10 PK STRL BLUE (TOWEL DISPOSABLE) ×2 IMPLANT
TOWER SMARTMIX MINI (MISCELLANEOUS) ×1 IMPLANT
TRAY FOLEY CATH 14FR (SET/KITS/TRAYS/PACK) IMPLANT
WATER STERILE IRR 1000ML POUR (IV SOLUTION) ×2 IMPLANT
YANKAUER SUCT BULB TIP NO VENT (SUCTIONS) ×2 IMPLANT
global Unite Standard Humeral head (Head) ×1 IMPLANT

## 2012-01-18 NOTE — Anesthesia Postprocedure Evaluation (Signed)
  Anesthesia Post-op Note  Patient: [name]  Procedure(s) Performed: Procedure(s) (LRB) with comments: TOTAL SHOULDER ARTHROPLASTY (Left)  Patient Location: PACU  Anesthesia Type: General and GA combined with regional for post-op pain  Level of Consciousness: awake, alert  and oriented  Airway and Oxygen Therapy: Patient Spontanous Breathing and Patient connected to nasal cannula oxygen  Post-op Pain: none  Post-op Assessment: Post-op Vital signs reviewed and Patient's Cardiovascular Status Stable  Post-op Vital Signs: stable  Complications: No apparent anesthesia complications

## 2012-01-18 NOTE — Op Note (Signed)
Procedure(s): TOTAL SHOULDER ARTHROPLASTY Procedure Note  [name] female 71 y.o. 01/18/2012  Procedure(s) and Anesthesia Type:    * TOTAL SHOULDER ARTHROPLASTY - Choice  Surgeon(s) and Role:    * Mable Paris, MD - Primary    * Harvie Junior, MD - Assisting   Indications:  71 y.o. female  With endstage left shoulder arthritis. Pain and dysfunction interfered with quality of life and nonoperative treatment with activity modification, NSAIDS and injections failed.     Surgeon: Mable Paris   Assistants: [name] MD (assistance was essential for retraction and positioning), Jiles Harold PA-C  Anesthesia: General endotracheal anesthesia with preop interscalene block     Procedure Detail  TOTAL SHOULDER ARTHROPLASTY  Findings: Depuy Size 8 porocoat stem, 44x15 centered head, 44 glenoid cemented APG glenoid, LT osteotomy, excellent stability.   Estimated Blood Loss:  200 mL         Drains: 1 medium hemovac  Blood Given: none          Specimens: none        Complications:  * No complications entered in OR log *         Disposition: PACU - hemodynamically stable.         Condition: stable    Procedure:   The patient was identified in the preoperative holding area where I personally marked the operative extremity after verifying with the patient and consent. She  was taken to the operating room where She was transferred to the   operative table.  The patient received an interscalene block in   the holding area by the attending anesthesiologist.  General anesthesia was induced   in the operating room without complication.  The patient did receive IV  Ancef prior to the commencement of the procedure.  The patient was   placed in the beach-chair position with the back raised about 30   degrees.  The nonoperative extremity and head and neck were carefully   positioned and padded protecting against neurovascular compromise.  The   left upper extremity was then prepped and draped in the standard sterile   fashion.    The appropriate operative time-out was performed with   Anesthesia, the perioperative staff, as well as myself and we all agreed   that the left side was the correct operative site.  An approximately   10 cm incision was made from the tip of the coracoid to the center point of the   humerus at the level of the axilla.  Dissection was carried down sharply   through subcutaneous tissues and cephalic vein was identified and taken   laterally with the deltoid.  The pectoralis major was taken medially.  The   upper 1 cm of the pectoralis major was released from its attachment on   the humerus.  The clavipectoral fascia was incised just lateral to the   conjoined tendon.  This incision was carried up to but not into the   coracoacromial ligament.  Digital palpation was used to prove   integrity of the axillary nerve which was protected throughout the   procedure.  Musculocutaneous nerve was not palpated in the operative   field.  Conjoined tendon was then retracted gently medially and the   deltoid laterally.  Anterior circumflex humeral vessels were clamped and   coagulated.  The soft tissues overlying the biceps was incised and this   incision was carried across the transverse humeral ligament to the base  of the coracoid.  The biceps was tenodesed to the soft tissue just above   pectoralis major and the remaining portion of the biceps superiorly was   excised.  An osteotomy was performed at the lesser tuberosity and the   subscapularis was freed from the underlying capsule.  Capsule was then   released all the way down to the 6 o'clock position of the humeral head.   The humeral head was then delivered with simultaneous adduction,   extension and external rotation.  All humeral osteophytes were removed   and the anatomic neck of the humerus was marked and cut free hand at   approximately 25 degrees  retroversion within about 3 mm of the cuff   reflection posteriorly.  The head size was estimated to be a 44 medium   offset.  The humeral osteotome and broach were used and the broach was left in place (size 8)  At that point, the humeral head was retracted posteriorly with   a Fukuda retractor.  Remaining portion of the capsule was released at the base of the   coracoid.  The remaining biceps anchor and the entire anterior-inferior   labrum was excised.  The posterior labrum was also excised but the   posterior capsule was not released.  The guidepin was placed bicortically with 0 elevated guide.  The reamer was used to ream to concentric bone with punctate bleeding.  This gave an excellent concentric surface.  The center hole was then drilled for an anchor peg glenoid followed by the three peripheral holes and 0 of the holes   exited the glenoid wall.  I then pulse irrigated these holes and dried   them with Surgicel and thrombin.  The three peripheral holes were then   pressurized cemented and the anchor peg glenoid was placed and impacted   with an excellent fit.  The glenoid was a 44 component.  The proximal humerus was then again exposed taking care not to displace the glenoid.    The trial head was placed.   Calcar reamer was used.The eccentric 44 x 15 head fit best.  With the trial implantation of the component, there was   approximately 50% posterior translation with immediate snap back to the   anatomic position.  With forward elevation, there was no tendency   towards posterior subluxation.   The trial was removed and the final implant was prepared on a back table.  The implant was then impacted and   achieved excellent anatomic reconstruction of the proximal humerus.  #2 Ethibond was placed around the neck prior to impaction.  The joint   was then copiously irrigated with pulse lavage.  The subscapularis and   lesser tuberosity osteotomy were then repaired using 2 #2 Ethibonds   and  the #2 Ethibond around the neck of the implant in a double row type   repair.  One #1 Ethibond was placed at the rotator interval just above   the lesser tuberosity.  After repair of the lesser tuberosity, a medium   Hemovac was placed out anterolaterally and again copious irrigation was   used.   Skin was closed with 2-0 Vicryl sutures in the deep dermal layer and 4-0 Monocryl in a subcuticular  running fashion.  Sterile dressings were then applied including Steri- Strips, 4x4s, ABDs and tape.  The patient was placed in a sling and allowed to awaken from general anesthesia and taken to the recovery room in stable  condition.  POSTOPERATIVE PLAN:  Early passive range of motion will be allowed with the goal of 0 degrees external rotation and a 90 degrees forward elevation.  No internal rotation at this time.  No active motion of the arm until the lesser tuberosity heels.  The patient will likely be kept in the hospital for 2 days and then discharged home.

## 2012-01-18 NOTE — Transfer of Care (Signed)
Immediate Anesthesia Transfer of Care Note  Patient: [name]  Procedure(s) Performed: Procedure(s) (LRB) with comments: TOTAL SHOULDER ARTHROPLASTY (Left)  Patient Location: PACU  Anesthesia Type: General  Level of Consciousness: awake, alert  and oriented  Airway & Oxygen Therapy: Patient Spontanous Breathing  Post-op Assessment: Report given to PACU RN  Post vital signs: stable  Complications: No apparent anesthesia complications

## 2012-01-18 NOTE — Anesthesia Preprocedure Evaluation (Addendum)
Anesthesia Evaluation  Patient identified by MRN, date of birth, ID band Patient awake    Reviewed: Allergy & Precautions, H&P , NPO status , Patient's Chart, lab work & pertinent test results, reviewed documented beta blocker date and time   Airway Mallampati: I TM Distance: >3 FB Neck ROM: Full    Dental  (+) Dental Advisory Given and Teeth Intact   Pulmonary pneumonia -, resolved,  breath sounds clear to auscultation        Cardiovascular hypertension, Pt. on medications Rhythm:Regular Rate:Normal     Neuro/Psych Anxiety Depression    GI/Hepatic   Endo/Other    Renal/GU      Musculoskeletal   Abdominal (+)  Abdomen: soft. Bowel sounds: normal.  Peds  Hematology   Anesthesia Other Findings   Reproductive/Obstetrics                        Anesthesia Physical Anesthesia Plan  ASA: III  Anesthesia Plan: General   Post-op Pain Management:    Induction: Intravenous  Airway Management Planned: Oral ETT  Additional Equipment:   Intra-op Plan:   Post-operative Plan: Extubation in OR  Informed Consent: I have reviewed the patients History and Physical, chart, labs and discussed the procedure including the risks, benefits and alternatives for the proposed anesthesia with the patient or authorized representative who has indicated his/her understanding and acceptance.     Plan Discussed with: CRNA and Surgeon  Anesthesia Plan Comments:         Anesthesia Quick Evaluation

## 2012-01-18 NOTE — Anesthesia Procedure Notes (Addendum)
Anesthesia Regional Block:  Interscalene brachial plexus block  Pre-Anesthetic Checklist: ,, timeout performed, Correct Patient, Correct Site, Correct Laterality, Correct Procedure, Correct Position, site marked, Risks and benefits discussed,  Surgical consent,  Pre-op evaluation,  At surgeon's request and post-op pain management  Laterality: Left  Prep: chloraprep       Needles:  Injection technique: Single-shot  Needle Type: Echogenic Stimulator Needle     Needle Length: 5cm 5 cm     Additional Needles:  Procedures: ultrasound guided and nerve stimulator Interscalene brachial plexus block  Nerve Stimulator or Paresthesia:  Response: 0.4 mA,   Additional Responses:   Narrative:  Start time: 01/18/2012 12:05 PM End time: 01/18/2012 12:20 PM Injection made incrementally with aspirations every 5 mL.  Performed by: Personally  Anesthesiologist: Arta Bruce MD  Additional Notes: Monitors applied. Patient sedated. Sterile prep and drape,hand hygiene and sterile gloves were used. Relevant anatomy identified.Needle position confirmed.Local anesthetic injected incrementally after negative aspiration. Local anesthetic spread visualized around nerve(s). Vascular puncture avoided. No complications. Image printed for medical record.The patient tolerated the procedure well.       Interscalene brachial plexus block Procedure Name: Intubation Date/Time: 01/18/2012 12:47 PM Performed by: Ellin Goodie Pre-anesthesia Checklist: Patient identified, Emergency Drugs available, Suction available, Patient being monitored and Timeout performed Patient Re-evaluated:Patient Re-evaluated prior to inductionOxygen Delivery Method: Circle system utilized Preoxygenation: Pre-oxygenation with 100% oxygen Intubation Type: IV induction Ventilation: Mask ventilation without difficulty Laryngoscope Size: Mac and 3 Grade View: Grade II Tube type: Oral Tube size: 7.5 mm Number of attempts: 1 Airway  Equipment and Method: Stylet Placement Confirmation: ETT inserted through vocal cords under direct vision,  positive ETCO2 and breath sounds checked- equal and bilateral Secured at: 22 cm Tube secured with: Tape Dental Injury: Teeth and Oropharynx as per pre-operative assessment

## 2012-01-18 NOTE — H&P (Signed)
[name] is an 71 y.o. female.   Chief Complaint: L shoulder pain and dysfunction HPI:  History of endstage bone on bone arthritis L shoulder with bone loss.  Failed conservative management with OTC meds, activity modification, injections.    Past Medical History  Diagnosis Date  . Anemia     on iron  . Hypertension   . Hyperlipidemia     on med  . Anxiety   . Arthritis     shoulders, knees, hands, hips  . Depression   . Pneumonia 2003    Past Surgical History  Procedure Date  . Wisdon teeth ext   . Svd     x 7  . Tubal ligation   . Knee surgery 11/2001    left knee  . Replacement total knee 10/2009    left knee  . Carpel tunnel release     left hand  . Colonscopy   . Joint replacement 10/2009    left knee    History reviewed. No pertinent family history. Social History:  reports that she quit smoking about 27 years ago. Her smoking use included Cigarettes. She has a 25 pack-year smoking history. She has quit using smokeless tobacco. She reports that she does not drink alcohol or use illicit drugs.  Allergies:  Allergies  Allergen Reactions  . Sulfa Drugs Cross Reactors Itching    Medications Prior to Admission  Medication Sig Dispense Refill  . aspirin EC 81 MG tablet Take 81 mg by mouth daily.      . furosemide (LASIX) 20 MG tablet Take 20 mg by mouth daily.      Marland Kitchen ibuprofen (ADVIL,MOTRIN) 200 MG tablet Take 400 mg by mouth every 6 (six) hours as needed. For cramps      . IRON PO Take 55 mg by mouth daily with breakfast. Per pt report      . lisinopril (PRINIVIL,ZESTRIL) 20 MG tablet Take 20 mg by mouth daily.      Marland Kitchen LORazepam (ATIVAN) 0.5 MG tablet Take 0.5 mg by mouth every 8 (eight) hours as needed. Anxiety      . pravastatin (PRAVACHOL) 40 MG tablet Take 40 mg by mouth 2 (two) times daily.      . traMADol (ULTRAM) 50 MG tablet Take 100 mg by mouth every 6 (six) hours as needed. Maximum dose= 8 tablets per day Prn pain        No results found for this  or any previous visit (from the past 48 hour(s)). No results found.  Review of Systems  All other systems reviewed and are negative.    Blood pressure 103/65, pulse 74, temperature 98.1 F (36.7 C), temperature source Oral, resp. rate 18, SpO2 96.00%. Physical Exam  Constitutional: She is oriented to person, place, and time. She appears well-developed and well-nourished.  HENT:  Head: Atraumatic.  Eyes: EOM are normal.  Cardiovascular: Intact distal pulses.   Respiratory: Effort normal.  Musculoskeletal:       Left shoulder: She exhibits decreased range of motion, tenderness and pain.  Neurological: She is alert and oriented to person, place, and time.  Skin: Skin is warm and dry.  Psychiatric: She has a normal mood and affect.     Assessment/Plan L shoulder endstage OA Plan L total shoulder replacement Risks / benefits of surgery discussed Consent on chart  NPO for OR Preop antibiotics   Gurinder Toral WILLIAM 01/18/2012, 12:28 PM

## 2012-01-19 ENCOUNTER — Encounter (HOSPITAL_COMMUNITY): Payer: Self-pay | Admitting: General Practice

## 2012-01-19 HISTORY — PX: TOTAL SHOULDER REPLACEMENT: SUR1217

## 2012-01-19 LAB — CBC
Hemoglobin: 10.4 g/dL — ABNORMAL LOW (ref 12.0–15.0)
MCH: 30.7 pg (ref 26.0–34.0)
MCV: 92.3 fL (ref 78.0–100.0)
RBC: 3.39 MIL/uL — ABNORMAL LOW (ref 3.87–5.11)
WBC: 10 10*3/uL (ref 4.0–10.5)

## 2012-01-19 LAB — BASIC METABOLIC PANEL
CO2: 28 mEq/L (ref 19–32)
Calcium: 8.9 mg/dL (ref 8.4–10.5)
Chloride: 106 mEq/L (ref 96–112)
Creatinine, Ser: 0.6 mg/dL (ref 0.50–1.10)
Glucose, Bld: 99 mg/dL (ref 70–99)
Sodium: 140 mEq/L (ref 135–145)

## 2012-01-19 MED ORDER — INFLUENZA VIRUS VACC SPLIT PF IM SUSP
0.5000 mL | INTRAMUSCULAR | Status: AC
  Filled 2012-01-19: qty 0.5

## 2012-01-19 NOTE — Evaluation (Signed)
Physical Therapy Evaluation Patient Details Name: [name] MRN: [id number] DOB: July 26, 1940 Today's Date: 01/19/2012 Time: 1050-1110 PT Time Calculation (min): 20 min  PT Assessment / Plan / Recommendation Clinical Impression  Pt is a 71 y/o female admitted s/p left TSA along with the below PT problem list. Pt would benefit from acute PT to maximize independence and facilitate d/c to SNF due to pt without 24 hour assist and is at increased risk for falls without assist currently.    PT Assessment  Patient needs continued PT services    Follow Up Recommendations  Post acute inpatient;Supervision/Assistance - 24 hour    Does the patient have the potential to tolerate intense rehabilitation   No, Recommend SNF  Barriers to Discharge Decreased caregiver support Pt without 24 hour assist for safe d/c home.    Equipment Recommendations  None recommended by PT    Recommendations for Other Services     Frequency Min 3X/week    Precautions / Restrictions Precautions Precautions: Shoulder;Fall Type of Shoulder Precautions: TSA protocol per MD and OT. Precaution Booklet Issued: No Restrictions Weight Bearing Restrictions: Yes LUE Weight Bearing: Non weight bearing Other Position/Activity Restrictions: Sling left UE.   Pertinent Vitals/Pain None      Mobility  Bed Mobility Bed Mobility: Supine to Sit Supine to Sit: 4: Min assist;HOB elevated (HOB 30 degrees.) Sit to Supine: 4: Min assist;HOB elevated ((A) with BLE's) Scooting to HOB: 3: Mod assist;With rail Details for Bed Mobility Assistance: Assist for trunk due to decreased use of left UE from precautions with cues for safety and protection to left UE. Transfers Transfers: Sit to Stand;Stand to Sit (2 trials.) Sit to Stand: 4: Min assist;With upper extremity assist;From bed;From toilet Stand to Sit: 4: Min assist;With upper extremity assist;To chair/3-in-1;To toilet Details for Transfer Assistance: Assist for balance  and to protect left UE. Cues for safest hand placement and sequence. Ambulation/Gait Ambulation/Gait Assistance: 4: Min assist Ambulation Distance (Feet): 50 Feet (10 feet and 40 feet.) Assistive device: 1 person hand held assist Ambulation/Gait Assistance Details: Assist for balance with cues to protect left UE.  Gait Pattern: Step-through pattern;Decreased stride length;Trunk flexed;Narrow base of support Gait velocity: Slowed for caution. Stairs: No Wheelchair Mobility Wheelchair Mobility: No    Shoulder Instructions Donning/doffing shirt without moving shoulder: Minimal assistance Method for sponge bathing under operated UE: Maximal assistance;Patient able to independently direct caregiver Donning/doffing sling/immobilizer: Minimal assistance;Patient able to independently direct caregiver Correct positioning of sling/immobilizer: Supervision/safety ROM for elbow, wrist and digits of operated UE: Supervision/safety Sling wearing schedule (on at all times/off for ADL's): Independent Positioning of UE while sleeping: Independent   Exercises Shoulder Exercises Shoulder Flexion: PROM;Left;10 reps;Supine;Other (comment) (To 90 degrees) Shoulder External Rotation: PROM;10 reps;Supine;Seated;Other (comment) (to neutral) Elbow Flexion: AROM;Seated;Left Elbow Extension: AROM;Left;Seated Wrist Flexion: AROM;Left;Seated Wrist Extension: AROM;Left;Seated Digit Composite Flexion: AROM;Left;Seated Composite Extension: AROM;Left;Seated Neck Flexion: AROM;Seated Neck Extension: AROM;Seated Neck Lateral Flexion - Right: AROM;Seated Neck Lateral Flexion - Left: AROM;Seated   PT Diagnosis: Difficulty walking  PT Problem List: Decreased strength;Decreased activity tolerance;Decreased balance;Decreased mobility;Decreased knowledge of precautions PT Treatment Interventions: DME instruction;Gait training;Functional mobility training;Therapeutic activities;Balance training;Patient/family education    PT Goals Acute Rehab PT Goals PT Goal Formulation: With patient Time For Goal Achievement: 02/02/12 Potential to Achieve Goals: Good Pt will go Supine/Side to Sit: with modified independence PT Goal: Supine/Side to Sit - Progress: Goal set today Pt will go Sit to Supine/Side: with modified independence PT Goal: Sit to Supine/Side - Progress: Goal set  today Pt will go Sit to Stand: with modified independence PT Goal: Sit to Stand - Progress: Goal set today Pt will go Stand to Sit: with modified independence PT Goal: Stand to Sit - Progress: Goal set today Pt will Ambulate: 51 - 150 feet;with modified independence;with least restrictive assistive device PT Goal: Ambulate - Progress: Goal set today  Visit Information  Last PT Received On: 01/19/12 Assistance Needed: +1    Subjective Data  Subjective: "I am going to go to [place] for a little while." Patient Stated Goal: Get strong.   Prior Functioning  Home Living Lives With: Son;Daughter Available Help at Discharge: Family Type of Home: House Home Access: Stairs to enter Secretary/administrator of Steps: 2 Home Layout: One level Bathroom Shower/Tub: Health visitor: Handicapped height Home Adaptive Equipment: Shower chair with back;Walker - rolling Prior Function Level of Independence: Independent Driving: Yes Vocation: Retired Musician: No difficulties Dominant Hand: Right    Cognition  Overall Cognitive Status: Appears within functional limits for tasks assessed/performed Arousal/Alertness: Awake/alert Orientation Level: Appears intact for tasks assessed Behavior During Session: [facility] for tasks performed    Extremity/Trunk Assessment Right Upper Extremity Assessment RUE ROM/Strength/Tone: WFL for tasks assessed Left Upper Extremity Assessment LUE ROM/Strength/Tone: Deficits;Due to precautions Right Lower Extremity Assessment RLE ROM/Strength/Tone: Within functional levels RLE  Sensation: WFL - Light Touch RLE Coordination: WFL - gross motor Left Lower Extremity Assessment LLE ROM/Strength/Tone: Within functional levels LLE Sensation: WFL - Light Touch LLE Coordination: WFL - gross motor Trunk Assessment Trunk Assessment: Normal   Balance Balance Balance Assessed: No  End of Session PT - End of Session Equipment Utilized During Treatment: Other (comment) (Left UE sling.) Activity Tolerance: Patient tolerated treatment well Patient left: in chair;with call bell/phone within reach;with family/visitor present Nurse Communication: Mobility status  GP     Cephus Shelling 01/19/2012, 11:25 AM  01/19/2012 Cephus Shelling, PT, DPT [phone]

## 2012-01-19 NOTE — Progress Notes (Signed)
Occupational Therapy Evaluation Patient Details Name: [name] MRN: [id number] DOB: [birth year] Today's Date: 01/19/2012 Time: 0815-0900 OT Time Calculation (min): 45 min  OT Assessment / Plan / Recommendation Clinical Impression  Pt. is a pleasant 71 yo female s/p left TSA. Pt. Educated on sling wear, exercises, and bathing/dressing UE. Pt would benefit from one more session to ensure independence with shoulder exercises. Pt. had difficulty recalling exercises at end of session.     OT Assessment  Patient needs continued OT Services    Follow Up Recommendations  Skilled nursing facility    Barriers to Discharge      Equipment Recommendations  None recommended by OT    Recommendations for Other Services    Frequency  Min 2X/week    Precautions / Restrictions Restrictions Weight Bearing Restrictions: Yes LUE Weight Bearing: Non weight bearing   Pertinent Vitals/Pain Pain level 7/10. Pt. Stated she had just received pain meds.     ADL  Grooming: Performed;Wash/dry hands;Supervision/safety Where Assessed - Grooming: Unsupported standing Upper Body Bathing: Performed;Left arm;Maximal assistance Where Assessed - Upper Body Bathing: Unsupported sitting Upper Body Dressing: Performed;Minimal assistance Where Assessed - Upper Body Dressing: Unsupported sitting Toilet Transfer: Performed;Supervision/safety Toilet Transfer Method: Sit to stand Toilet Transfer Equipment: Regular height toilet Toileting - Clothing Manipulation and Hygiene: Performed;Independent Where Assessed - Toileting Clothing Manipulation and Hygiene: Lean right and/or left Transfers/Ambulation Related to ADLs: Pt. supervision for transfers and ambulation in room.  ADL Comments: Pt. educated on sling wear and how to don/doff. Pt. educated on shoulder exercises PROM FF 90 degrees and ER to neutral, AROM of elbow wrist and fingers. Pt. had some difficulty with recalling exercises at end of session. Pt. educated  on how to bath and dress LUE. Pt completed toilet transfer and grooming standing at sink with supervision for safety.     OT Diagnosis: Acute pain  OT Problem List: Decreased knowledge of precautions;Pain OT Treatment Interventions: Self-care/ADL training;Therapeutic exercise;Patient/family education   OT Goals Acute Rehab OT Goals OT Goal Formulation: With patient Miscellaneous OT Goals Miscellaneous OT Goal #1: Pt. will demonstrate independence with all shoulder exercises in preparation for d/c  OT Goal: Miscellaneous Goal #1 - Progress: Goal set today  Visit Information  Last OT Received On: 01/19/12 Assistance Needed: +1    Subjective Data  Subjective: I changed my mind and want to go to a rehab before I go home.  Patient Stated Goal: go home and and get back to water aerobics   Prior Functioning     Home Living Lives With: Son;Daughter Available Help at Discharge: Family Type of Home: House Home Access: Stairs to enter Secretary/administrator of Steps: 2 Home Layout: One level Bathroom Shower/Tub: Health visitor: Handicapped height Home Adaptive Equipment: Shower chair with back;Walker - rolling Prior Function Level of Independence: Independent Driving: Yes Vocation: Retired Musician: No difficulties Dominant Hand: Right         Vision/Perception     Cognition  Overall Cognitive Status: Appears within functional limits for tasks assessed/performed Arousal/Alertness: Awake/alert Orientation Level: Appears intact for tasks assessed Behavior During Session: [facility] for tasks performed    Extremity/Trunk Assessment Right Upper Extremity Assessment RUE ROM/Strength/Tone: [facility] for tasks assessed     Mobility Bed Mobility Bed Mobility: Supine to Sit;Sit to Supine;Scooting to HOB Supine to Sit: 4: Min assist;HOB flat Sit to Supine: 4: Min assist;HOB elevated ((A) with BLE's) Scooting to HOB: 3: Mod assist;With rail Details for  Bed Mobility Assistance: Pt.  required min (A) to support legs when getting back into bed. Pt. was mod (A) for scooting to [facility] using legs to push upward in bed and rails while therapist used sheet to (A) with scooting.   Transfers Transfers: Sit to Stand;Stand to Sit Sit to Stand: 5: Supervision;With upper extremity assist;From bed;From toilet Stand to Sit: 5: Supervision;With upper extremity assist;To bed;To toilet     Shoulder Instructions Donning/doffing shirt without moving shoulder: Minimal assistance Method for sponge bathing under operated UE: Maximal assistance;Patient able to independently direct caregiver Donning/doffing sling/immobilizer: Minimal assistance;Patient able to independently direct caregiver Correct positioning of sling/immobilizer: Supervision/safety ROM for elbow, wrist and digits of operated UE: Supervision/safety Sling wearing schedule (on at all times/off for ADL's): Independent Positioning of UE while sleeping: Independent   Exercise Shoulder Exercises Shoulder Flexion: PROM;Left;10 reps;Supine;Other (comment) (To 90 degrees) Shoulder External Rotation: PROM;10 reps;Supine;Seated;Other (comment) (to neutral) Elbow Flexion: AROM;Seated;Left Elbow Extension: AROM;Left;Seated Wrist Flexion: AROM;Left;Seated Wrist Extension: AROM;Left;Seated Digit Composite Flexion: AROM;Left;Seated Composite Extension: AROM;Left;Seated Neck Flexion: AROM;Seated Neck Extension: AROM;Seated Neck Lateral Flexion - Right: AROM;Seated Neck Lateral Flexion - Left: AROM;Seated   Balance     End of Session OT - End of Session Activity Tolerance: Patient tolerated treatment well Patient left: in bed;with call bell/phone within reach;with family/visitor present Nurse Communication: Mobility status;Weight bearing status  GO     [name] 01/19/2012, 9:21 AM

## 2012-01-19 NOTE — Progress Notes (Signed)
PATIENT ID: [name]   1 Day Post-Op Procedure(s) (LRB): TOTAL SHOULDER ARTHROPLASTY (Left)  Subjective: Feeling good. Pain is well controlled. Worked with OT today to learn exercises. Family expressed concern for the patient to go home, wishes for her to go to nursing home for a few days after discharge.   Objective:  Filed Vitals:   01/19/12 0554  BP: 115/59  Pulse: 69  Temp: 98.9 F (37.2 C)  Resp: 18     LUE dressing c/d/i, in sling Drain removed Wiggles fingers, decreased sensation to light touch of median nerve distribution Deltoid firing sucessfully  Labs:   Basename 01/19/12 0550  HGB 10.4*   Basename 01/19/12 0550  WBC 10.0  RBC 3.39*  HCT 31.3*  PLT 228   Basename 01/19/12 0550  NA 140  K 3.9  CL 106  CO2 28  BUN 9  CREATININE 0.60  GLUCOSE 99  CALCIUM 8.9    Assessment and Plan: Pain well controlled, continue current pain mgmt Some subjective numbness of hand, most likely due to block, will continue to monitor Continue OT per instructions 0 degrees ER, 90 degresss FF  Patient and family wish patient to be discharged to nursing home. Will have PT consult to see if this is indicated.  Discharge home tomorrow  if not reccommended to go to nursing home.   VTE proph: ASA 325 mg BID, SCDs

## 2012-01-19 NOTE — Progress Notes (Signed)
UR COMPLETED  

## 2012-01-19 NOTE — Progress Notes (Signed)
I agree with the following treatment note after reviewing documentation.   Johnston, Honora Searson Brynn   OTR/L Pager: [phone] Office: [phone] .   

## 2012-01-20 ENCOUNTER — Encounter (HOSPITAL_COMMUNITY): Payer: Self-pay | Admitting: Orthopedic Surgery

## 2012-01-20 MED ORDER — MAGNESIUM HYDROXIDE 400 MG/5ML PO SUSP
30.0000 mL | Freq: Every day | ORAL | Status: DC | PRN
  Administered 2012-01-21: 30 mL via ORAL
  Filled 2012-01-20: qty 30

## 2012-01-20 NOTE — Clinical Social Work Placement (Addendum)
    Clinical Social Work Department CLINICAL SOCIAL WORK PLACEMENT NOTE 01/20/2012  Patient:  [name],[name]  Account Number:  [id number] Admit date:  01/18/2012  Clinical Social Worker:  Lupita Leash Stokes Rattigan, LCSWA  Date/time:  01/19/2012 12:00 M  Clinical Social Work is seeking post-discharge placement for this patient at the following level of care:   SKILLED NURSING   (*CSW will update this form in Epic as items are completed)     Patient/family provided with [facility] Department of Clinical Social Work's list of facilities offering this level of care within the geographic area requested by the patient (or if unable, by the patient's family).    Patient/family informed of their freedom to choose among providers that offer the needed level of care, that participate in Medicare, Medicaid or managed care program needed by the patient, have an available bed and are willing to accept the patient.    Patient/family informed of [facility]' ownership interest in [facility], as well as of the fact that they are under no obligation to receive care at this facility.  PASARR submitted to EDS on  PASARR number received from EDS on   FL2 transmitted to all facilities in geographic area requested by pt/family on  01/19/2012 FL2 transmitted to all facilities within larger geographic area on   Patient informed that his/her managed care company has contracts with or will negotiate with  certain facilities, including the following:   Has exisiting PASARR.  Pt deferred SNF list.     Patient/family informed of bed offers received:  01/19/2012 Patient chooses bed at [facility] Physician recommends and patient chooses bed at    Patient to be transferred to [facility] on   01/21/12 Patient to be transferred to facility by [facility] (per request of patient)  The following physician request were entered in Epic:   Additional Comments: Patient is very pleased with d/c plan.  She is excited  about going to rehab.  Notified SNF and pt's nurse- Inetta Fermo of d/c plan.  Lorri Frederick. [place]  [phone]

## 2012-01-20 NOTE — Clinical Social Work Psychosocial (Addendum)
    Clinical Social Work Department BRIEF PSYCHOSOCIAL ASSESSMENT 01/20/2012  Patient:  HeatherHeather [name]     Account Number:  [id number]     Admit date:  01/18/2012  Clinical Social Worker:  Tiburcio Pea  Date/Time:  01/19/2012 04:00 PM  Referred by:  Physician  Date Referred:  01/19/2012 Referred for  SNF Placement   Other Referral:   Interview type:  Other - See comment Other interview type:   Patient, daughter and other family (in room)-- Pt wanted them included in our meeting.    PSYCHOSOCIAL DATA Living Status:  WITH ADULT CHILDREN Admitted from facility:   Level of care:   Primary support name:  Sydnee Cabal Primary support relationship to patient:  CHILD, ADULT Degree of support available:   Strong support from family    CURRENT CONCERNS Current Concerns  Post-Acute Placement   Other Concerns:    SOCIAL WORK ASSESSMENT / PLAN Met with patient along with her daughter and other family members to discuss need for short term SNF. Patient relates that she wants to go to [facility] for her short term rehab. She has been there in the past and notified the SNF prior to admission of desire to return there.  Spoke with Tammy at [facility]- she is aware of patient and will accept her when medically stable. She will have a bed on Thrusday 01/21/12.  Fl2 placed on chart for MD's signature.   Assessment/plan status:  Psychosocial Support/Ongoing Assessment of Needs Other assessment/ plan:   Information/referral to community resources:   Patient deferred SNF list  Discussed after care needs- to be arranged by SNF as indicated. Patinent states she is familiar with this process.    PATIENT'S/FAMILY'S RESPONSE TO PLAN OF CARE: Patient is alert, oriented and very pleasant. She wants to go to the snf for short term care and states that she had a very positive experience there in the past.  Will plan d/c to SNF when medically ready per MD.

## 2012-01-20 NOTE — Progress Notes (Signed)
I agree with the following treatment note after reviewing documentation.   Johnston, Les Longmore Brynn   OTR/L Pager: [phone] Office: [phone] .   

## 2012-01-20 NOTE — Progress Notes (Signed)
Occupational Therapy Discharge Patient Details Name: [name] MRN: [id number] DOB: 04/22/1940 Today's Date: 01/20/2012 Time: 4098-1191 OT Time Calculation (min): 9 min  Patient discharged from OT services secondary to goals met and no further OT needs identified.  Please see latest therapy progress note for current level of functioning and progress toward goals.    Progress and discharge plan discussed with patient and/or caregiver: Patient/Caregiver agrees with plan  GO     [name] 01/20/2012, 10:12 AM

## 2012-01-20 NOTE — Progress Notes (Signed)
Occupational Therapy Treatment Patient Details Name: [name] MRN: [id number] DOB: [birth year] Today's Date: 01/20/2012 Time: 7829-5621 OT Time Calculation (min): 9 min  OT Assessment / Plan / Recommendation Comments on Treatment Session Pt. able to recall and demonstrate shoulder exercises. Pt. required min verbal cues for FF, to keep elbow straight.     Follow Up Recommendations  Skilled nursing facility    Barriers to Discharge       Equipment Recommendations  None recommended by OT    Recommendations for Other Services    Frequency Min 2X/week   Plan All goals met and education completed, patient discharged from OT services    Precautions / Restrictions Precautions Precautions: Shoulder;Fall Type of Shoulder Precautions: TSA protocol per MD and OT. Restrictions Weight Bearing Restrictions: Yes LUE Weight Bearing: Non weight bearing   Pertinent Vitals/Pain None reported    ADL  Eating/Feeding: Performed;Set up Where Assessed - Eating/Feeding: Chair ADL Comments: Pt. able to recall all exercises with min cues for FF to keep elbow straight    OT Diagnosis:    OT Problem List:   OT Treatment Interventions:     OT Goals Acute Rehab OT Goals OT Goal Formulation: With patient Time For Goal Achievement: 02/02/12 Potential to Achieve Goals: Good Miscellaneous OT Goals Miscellaneous OT Goal #1: Pt. will demonstrate independence with all shoulder exercises in preparation for d/c  OT Goal: Miscellaneous Goal #1 - Progress: Met  Visit Information  Last OT Received On: 01/20/12 Assistance Needed: +1    Subjective Data      Prior Functioning       Cognition  Overall Cognitive Status: Appears within functional limits for tasks assessed/performed Arousal/Alertness: Awake/alert Orientation Level: Appears intact for tasks assessed Behavior During Session: [facility] for tasks performed    Mobility  Shoulder Instructions Bed Mobility Bed Mobility: Not assessed    ROM for elbow, wrist and digits of operated UE: Modified independent   Exercises  Shoulder Exercises Shoulder Flexion: PROM;Left;10 reps;Supine;Other (comment) (to 90 degrees) Shoulder External Rotation: PROM;10 reps;Supine;Seated;Other (comment) (to neutral ) Elbow Flexion: AROM;Seated;Left Elbow Extension: AROM;Left;Seated Wrist Flexion: AROM;Left;Seated Wrist Extension: AROM;Left;Seated Digit Composite Flexion: AROM;Left;Seated Composite Extension: AROM;Left;Seated Neck Flexion: AROM;Seated Neck Extension: AROM;Seated Neck Lateral Flexion - Right: AROM;Seated Neck Lateral Flexion - Left: AROM;Seated   Balance     End of Session OT - End of Session Activity Tolerance: Patient tolerated treatment well Patient left: in chair;with call bell/phone within reach;with nursing in room Nurse Communication: Precautions  GO     [name] 01/20/2012, 10:11 AM

## 2012-01-20 NOTE — Progress Notes (Signed)
I agree with the following treatment note after reviewing documentation.   Johnston, Esker Dever Brynn   OTR/L Pager: [phone] Office: [phone] .   

## 2012-01-20 NOTE — Progress Notes (Signed)
PATIENT ID: [name]   2 Days Post-Op Procedure(s) (LRB): TOTAL SHOULDER ARTHROPLASTY (Left)  Subjective: Feeling good. Slept well last night. Worked with PT and OT yesterday. Recommended discharge to SNF.   Objective:  Filed Vitals:   01/20/12 0652  BP: 101/59  Pulse: 70  Temp: 98.3 F (36.8 C)  Resp: 18     L UE dressing removed, steri strips intact, wound benign, in sling Wiggles fingers, intact sensation to light touch  Labs:   [facility] 01/19/12 0550  HGB 10.4*   Basename 01/19/12 0550  WBC 10.0  RBC 3.39*  HCT 31.3*  PLT 228   Basename 01/19/12 0550  NA 140  K 3.9  CL 106  CO2 28  BUN 9  CREATININE 0.60  GLUCOSE 99  CALCIUM 8.9    Assessment and Plan: Pain well controlled, continue current pain mgmt, will dc with Percocet 5-325 A little hypotensive, asymptomatic, will continue to monitor Will work one more day with PT/OT, will plan on dc to SNF tomorrow   VTE proph: ASA 325mg  BID, SCDs

## 2012-01-21 DIAGNOSIS — M129 Arthropathy, unspecified: Secondary | ICD-10-CM | POA: Diagnosis not present

## 2012-01-21 DIAGNOSIS — M6281 Muscle weakness (generalized): Secondary | ICD-10-CM | POA: Diagnosis not present

## 2012-01-21 DIAGNOSIS — R609 Edema, unspecified: Secondary | ICD-10-CM | POA: Diagnosis not present

## 2012-01-21 DIAGNOSIS — Z471 Aftercare following joint replacement surgery: Secondary | ICD-10-CM | POA: Diagnosis not present

## 2012-01-21 DIAGNOSIS — D509 Iron deficiency anemia, unspecified: Secondary | ICD-10-CM | POA: Diagnosis not present

## 2012-01-21 DIAGNOSIS — Z5189 Encounter for other specified aftercare: Secondary | ICD-10-CM | POA: Diagnosis not present

## 2012-01-21 DIAGNOSIS — M19012 Primary osteoarthritis, left shoulder: Secondary | ICD-10-CM

## 2012-01-21 DIAGNOSIS — F411 Generalized anxiety disorder: Secondary | ICD-10-CM | POA: Diagnosis not present

## 2012-01-21 DIAGNOSIS — I10 Essential (primary) hypertension: Secondary | ICD-10-CM | POA: Diagnosis not present

## 2012-01-21 DIAGNOSIS — IMO0002 Reserved for concepts with insufficient information to code with codable children: Secondary | ICD-10-CM | POA: Diagnosis not present

## 2012-01-21 DIAGNOSIS — M19019 Primary osteoarthritis, unspecified shoulder: Secondary | ICD-10-CM | POA: Diagnosis not present

## 2012-01-21 MED ORDER — OXYCODONE-ACETAMINOPHEN 5-325 MG PO TABS
1.0000 | ORAL_TABLET | ORAL | Status: DC | PRN
Start: 2012-01-21 — End: 2012-03-02

## 2012-01-21 NOTE — Discharge Summary (Signed)
Patient ID: [name] MRN: [id number] DOB/AGE: 1941/03/24 71 y.o.  Admit date: 01/18/2012 Discharge date: 01/21/2012  Admission Diagnoses:  Principal Problem:  *Osteoarthritis of left shoulder   Discharge Diagnoses:  Same  Past Medical History  Diagnosis Date  . Anemia     on iron  . Hypertension   . Hyperlipidemia     on med  . Anxiety   . Arthritis     shoulders, knees, hands, hips  . Depression   . Pneumonia 2003    Surgeries: Procedure(s): TOTAL SHOULDER ARTHROPLASTY on 01/18/2012   Consultants:    Discharged Condition: Improved  Hospital Course: [name] is an 71 y.o. female who was admitted 01/18/2012 for operative treatment ofOsteoarthritis of left shoulder. Patient has severe unremitting pain that affects sleep, daily activities, and work/hobbies. After pre-op clearance the patient was taken to the operating room on 01/18/2012 and underwent  Procedure(s): TOTAL SHOULDER ARTHROPLASTY.    Patient was given perioperative antibiotics: Anti-infectives     Start     Dose/Rate Route Frequency Ordered Stop   01/18/12 1900   ceFAZolin (ANCEF) IVPB 2 g/50 mL premix        2 g 100 mL/hr over 30 Minutes Intravenous Every 6 hours 01/18/12 1756 01/19/12 0725   01/17/12 1403   ceFAZolin (ANCEF) IVPB 2 g/50 mL premix        2 g 100 mL/hr over 30 Minutes Intravenous 60 min pre-op 01/17/12 1403 01/18/12 1255           Patient was given sequential compression devices, early ambulation, and ASA 325mg  BID to prevent DVT.  Patient benefited maximally from hospital stay and there were no complications.    Recent vital signs: Patient Vitals for the past 24 hrs:  BP Temp Pulse Resp SpO2 Height Weight  01/21/12 0539 104/59 mmHg 98.4 F (36.9 C) 78  18  97 % - -  01/20/12 2200 131/76 mmHg 98.6 F (37 C) 78  18  99 % - -  01/20/12 1300 103/52 mmHg 99.6 F (37.6 C) 68  20  98 % - -  01/20/12 0740 - - - - - 5' (1.524 m) 108.863 kg (240 lb)     Recent laboratory  studies:  Basename 01/19/12 0550  WBC 10.0  HGB 10.4*  HCT 31.3*  PLT 228  NA 140  K 3.9  CL 106  CO2 28  BUN 9  CREATININE 0.60  GLUCOSE 99  INR --  CALCIUM 8.9     Discharge Medications:     Medication List     As of 01/21/2012  7:10 AM    STOP taking these medications         ibuprofen 200 MG tablet   Commonly known as: ADVIL,MOTRIN      TAKE these medications         aspirin EC 81 MG tablet   Take 81 mg by mouth daily.      furosemide 20 MG tablet   Commonly known as: LASIX   Take 20 mg by mouth daily.      furosemide 20 MG tablet   Commonly known as: LASIX   Take 1 tablet (20 mg total) by mouth daily.      IRON PO   Take 55 mg by mouth daily with breakfast. Per pt report      lisinopril 20 MG tablet   Commonly known as: PRINIVIL,ZESTRIL   Take 20 mg by mouth daily.  LORazepam 0.5 MG tablet   Commonly known as: ATIVAN   Take 0.5 mg by mouth every 8 (eight) hours as needed. Anxiety      oxyCODONE-acetaminophen 5-325 MG per tablet   Commonly known as: PERCOCET/ROXICET   Take 1-2 tablets by mouth every 4 (four) hours as needed for pain.      pravastatin 40 MG tablet   Commonly known as: PRAVACHOL   Take 40 mg by mouth 2 (two) times daily.      pravastatin 40 MG tablet   Commonly known as: PRAVACHOL   Take 1 tablet (40 mg total) by mouth 2 (two) times daily.      traMADol 50 MG tablet   Commonly known as: ULTRAM   Take 100 mg by mouth every 6 (six) hours as needed. Maximum dose= 8 tablets per day  Prn pain        Diagnostic Studies: Dg Chest 2 View  01/11/2012  *RADIOLOGY REPORT*  Clinical Data: Preop  shoulder arthroplasty  CHEST - 2 VIEW  Comparison: 10/31/2009  Findings: Heart size is mildly enlarged.  No pleural effusion or edema.  No airspace consolidation.  Advanced bilateral shoulder arthroplasties identified.  Mild multilevel degenerative disc disease is noted within the thoracic spine.  IMPRESSION:  1.  No acute cardiopulmonary  abnormalities. 2.  Osteoarthritis involves both shoulders.   Original Report Authenticated By: Rosealee Albee, M.D.    Dg Shoulder Left Port  01/18/2012  *RADIOLOGY REPORT*  Clinical Data: Left total shoulder replacement  PORTABLE LEFT SHOULDER - 2+ VIEW  Comparison: CT 10/20/2010  Findings: Single postop the image demonstrates left humeral arthroplasty.  Drain is in place.  No fracture line visualized.  No evidence for hardware failure.  Left AC joint is normal.  Possible cardiomegaly but not well evaluated on the current projection.  IMPRESSION: Expected postoperative appearance after left shoulder arthroplasty.   Original Report Authenticated By: Harrel Lemon, M.D.     Disposition: 03- Skilled Nursing Facility      Discharge Orders    Future Orders Please Complete By Expires   Diet - low sodium heart healthy      Call MD / Call 911      Comments:   If you experience chest pain or shortness of breath, CALL 911 and be transported to the hospital emergency room.  If you develope a fever above 101 F, pus (white drainage) or increased drainage or redness at the wound, or calf pain, call your surgeon's office.   Constipation Prevention      Comments:   Drink plenty of fluids.  Prune juice may be helpful.  You may use a stool softener, such as Colace (over the counter) 100 mg twice a day.  Use MiraLax (over the counter) for constipation as needed.   Increase activity slowly as tolerated      Driving restrictions      Comments:   No driving until cleared by physician.   Lifting restrictions      Comments:   No lifting with left arm until cleared by physician.      Follow-up Information    Follow up with Mable Paris, MD. Schedule an appointment as soon as possible for a visit in 10 days.   Contact information:   [facility] [address] [place] [address] [phone]           Signed: Jiles Harold 01/21/2012, 7:10 AM

## 2012-01-21 NOTE — Progress Notes (Signed)
Report called to [facility], spoke with desiree. Left number in case she needed to reach this RN later for any additional information.

## 2012-01-21 NOTE — Progress Notes (Signed)
PATIENT ID: [name]   3 Days Post-Op Procedure(s) (LRB): TOTAL SHOULDER ARTHROPLASTY (Left)  Subjective: Feeling well today. Pain is well controlled. Ready to go to SNF.   Objective:  Filed Vitals:   01/21/12 0539  BP: 104/59  Pulse: 78  Temp: 98.4 F (36.9 C)  Resp: 18     L UE incision looks benign, mod swelling, no erythema or warmth, steris intact Wiggles fingers, intact sensation to light touch, distally NVI  Labs:   [facility] 01/19/12 0550  HGB 10.4*   Basename 01/19/12 0550  WBC 10.0  RBC 3.39*  HCT 31.3*  PLT 228   Basename 01/19/12 0550  NA 140  K 3.9  CL 106  CO2 28  BUN 9  CREATININE 0.60  GLUCOSE 99  CALCIUM 8.9    Assessment and Plan: Reviewed exercises, FF and ER  D/c to SNF today as recommended by PT/OT Percocet 5-325 1-2 po a 4-6 hrs prn pain Fu two weeks with Dr. Ave Filter  VTE proph: ASA 325mg  BID, SCDs

## 2012-01-25 DIAGNOSIS — M19019 Primary osteoarthritis, unspecified shoulder: Secondary | ICD-10-CM | POA: Diagnosis not present

## 2012-02-04 DIAGNOSIS — M19019 Primary osteoarthritis, unspecified shoulder: Secondary | ICD-10-CM | POA: Diagnosis not present

## 2012-02-04 DIAGNOSIS — I10 Essential (primary) hypertension: Secondary | ICD-10-CM | POA: Diagnosis not present

## 2012-02-04 DIAGNOSIS — R609 Edema, unspecified: Secondary | ICD-10-CM | POA: Diagnosis not present

## 2012-02-17 DIAGNOSIS — M129 Arthropathy, unspecified: Secondary | ICD-10-CM | POA: Diagnosis not present

## 2012-02-17 DIAGNOSIS — Z471 Aftercare following joint replacement surgery: Secondary | ICD-10-CM | POA: Diagnosis not present

## 2012-02-17 DIAGNOSIS — I10 Essential (primary) hypertension: Secondary | ICD-10-CM | POA: Diagnosis not present

## 2012-02-17 DIAGNOSIS — Z96619 Presence of unspecified artificial shoulder joint: Secondary | ICD-10-CM | POA: Diagnosis not present

## 2012-02-17 DIAGNOSIS — Z5189 Encounter for other specified aftercare: Secondary | ICD-10-CM | POA: Diagnosis not present

## 2012-02-17 DIAGNOSIS — F411 Generalized anxiety disorder: Secondary | ICD-10-CM | POA: Diagnosis not present

## 2012-02-17 DIAGNOSIS — IMO0002 Reserved for concepts with insufficient information to code with codable children: Secondary | ICD-10-CM | POA: Diagnosis not present

## 2012-02-17 DIAGNOSIS — M6281 Muscle weakness (generalized): Secondary | ICD-10-CM | POA: Diagnosis not present

## 2012-02-19 DIAGNOSIS — Z471 Aftercare following joint replacement surgery: Secondary | ICD-10-CM | POA: Diagnosis not present

## 2012-02-19 DIAGNOSIS — M129 Arthropathy, unspecified: Secondary | ICD-10-CM | POA: Diagnosis not present

## 2012-02-19 DIAGNOSIS — M6281 Muscle weakness (generalized): Secondary | ICD-10-CM | POA: Diagnosis not present

## 2012-02-19 DIAGNOSIS — I10 Essential (primary) hypertension: Secondary | ICD-10-CM | POA: Diagnosis not present

## 2012-02-19 DIAGNOSIS — Z5189 Encounter for other specified aftercare: Secondary | ICD-10-CM | POA: Diagnosis not present

## 2012-02-19 DIAGNOSIS — IMO0002 Reserved for concepts with insufficient information to code with codable children: Secondary | ICD-10-CM | POA: Diagnosis not present

## 2012-02-22 ENCOUNTER — Other Ambulatory Visit: Payer: Self-pay | Admitting: Physician Assistant

## 2012-02-23 ENCOUNTER — Other Ambulatory Visit: Payer: Self-pay | Admitting: Emergency Medicine

## 2012-02-24 DIAGNOSIS — I10 Essential (primary) hypertension: Secondary | ICD-10-CM | POA: Diagnosis not present

## 2012-02-24 DIAGNOSIS — Z5189 Encounter for other specified aftercare: Secondary | ICD-10-CM | POA: Diagnosis not present

## 2012-02-24 DIAGNOSIS — M6281 Muscle weakness (generalized): Secondary | ICD-10-CM | POA: Diagnosis not present

## 2012-02-24 DIAGNOSIS — IMO0002 Reserved for concepts with insufficient information to code with codable children: Secondary | ICD-10-CM | POA: Diagnosis not present

## 2012-02-24 DIAGNOSIS — Z471 Aftercare following joint replacement surgery: Secondary | ICD-10-CM | POA: Diagnosis not present

## 2012-02-24 DIAGNOSIS — M129 Arthropathy, unspecified: Secondary | ICD-10-CM | POA: Diagnosis not present

## 2012-02-25 ENCOUNTER — Other Ambulatory Visit: Payer: Self-pay | Admitting: Physician Assistant

## 2012-02-25 DIAGNOSIS — Z5189 Encounter for other specified aftercare: Secondary | ICD-10-CM | POA: Diagnosis not present

## 2012-02-25 DIAGNOSIS — I10 Essential (primary) hypertension: Secondary | ICD-10-CM | POA: Diagnosis not present

## 2012-02-25 DIAGNOSIS — M129 Arthropathy, unspecified: Secondary | ICD-10-CM | POA: Diagnosis not present

## 2012-02-25 DIAGNOSIS — Z471 Aftercare following joint replacement surgery: Secondary | ICD-10-CM | POA: Diagnosis not present

## 2012-02-25 DIAGNOSIS — IMO0002 Reserved for concepts with insufficient information to code with codable children: Secondary | ICD-10-CM | POA: Diagnosis not present

## 2012-02-25 DIAGNOSIS — M6281 Muscle weakness (generalized): Secondary | ICD-10-CM | POA: Diagnosis not present

## 2012-02-26 DIAGNOSIS — Z471 Aftercare following joint replacement surgery: Secondary | ICD-10-CM | POA: Diagnosis not present

## 2012-02-26 DIAGNOSIS — Z5189 Encounter for other specified aftercare: Secondary | ICD-10-CM | POA: Diagnosis not present

## 2012-02-26 DIAGNOSIS — M129 Arthropathy, unspecified: Secondary | ICD-10-CM | POA: Diagnosis not present

## 2012-02-26 DIAGNOSIS — M6281 Muscle weakness (generalized): Secondary | ICD-10-CM | POA: Diagnosis not present

## 2012-02-26 DIAGNOSIS — IMO0002 Reserved for concepts with insufficient information to code with codable children: Secondary | ICD-10-CM | POA: Diagnosis not present

## 2012-02-26 DIAGNOSIS — I10 Essential (primary) hypertension: Secondary | ICD-10-CM | POA: Diagnosis not present

## 2012-02-29 ENCOUNTER — Encounter (HOSPITAL_COMMUNITY): Payer: Self-pay | Admitting: *Deleted

## 2012-02-29 ENCOUNTER — Other Ambulatory Visit: Payer: Self-pay | Admitting: Orthopedic Surgery

## 2012-02-29 DIAGNOSIS — M75120 Complete rotator cuff tear or rupture of unspecified shoulder, not specified as traumatic: Secondary | ICD-10-CM | POA: Diagnosis not present

## 2012-02-29 NOTE — Progress Notes (Signed)
I left a message on Carla's voicemail at Dr Selina Cooley office, requesting orders for tomorrows surgery.

## 2012-03-01 ENCOUNTER — Encounter (HOSPITAL_COMMUNITY)
Admission: RE | Disposition: A | Discharge status: Home / Self Care | Payer: Self-pay | Source: Ambulatory Visit | Attending: Orthopedic Surgery

## 2012-03-01 ENCOUNTER — Encounter (HOSPITAL_COMMUNITY): Payer: Self-pay | Admitting: Anesthesiology

## 2012-03-01 ENCOUNTER — Encounter (HOSPITAL_COMMUNITY): Payer: Self-pay | Admitting: Surgery

## 2012-03-01 ENCOUNTER — Inpatient Hospital Stay (HOSPITAL_COMMUNITY)
Admission: RE | Admit: 2012-03-01 | Discharge: 2012-03-02 | DRG: 511 | Disposition: A | Discharge status: Home / Self Care | Payer: MEDICARE | Source: Ambulatory Visit | Attending: Orthopedic Surgery | Admitting: Orthopedic Surgery

## 2012-03-01 ENCOUNTER — Ambulatory Visit (HOSPITAL_COMMUNITY): Payer: MEDICARE | Admitting: Anesthesiology

## 2012-03-01 DIAGNOSIS — S46819A Strain of other muscles, fascia and tendons at shoulder and upper arm level, unspecified arm, initial encounter: Secondary | ICD-10-CM | POA: Diagnosis not present

## 2012-03-01 DIAGNOSIS — Z7982 Long term (current) use of aspirin: Secondary | ICD-10-CM | POA: Diagnosis not present

## 2012-03-01 DIAGNOSIS — Z87891 Personal history of nicotine dependence: Secondary | ICD-10-CM | POA: Diagnosis not present

## 2012-03-01 DIAGNOSIS — Z79899 Other long term (current) drug therapy: Secondary | ICD-10-CM

## 2012-03-01 DIAGNOSIS — E785 Hyperlipidemia, unspecified: Secondary | ICD-10-CM | POA: Diagnosis present

## 2012-03-01 DIAGNOSIS — Z471 Aftercare following joint replacement surgery: Secondary | ICD-10-CM | POA: Diagnosis not present

## 2012-03-01 DIAGNOSIS — F411 Generalized anxiety disorder: Secondary | ICD-10-CM | POA: Diagnosis present

## 2012-03-01 DIAGNOSIS — Z96659 Presence of unspecified artificial knee joint: Secondary | ICD-10-CM

## 2012-03-01 DIAGNOSIS — G8918 Other acute postprocedural pain: Secondary | ICD-10-CM | POA: Diagnosis not present

## 2012-03-01 DIAGNOSIS — M719 Bursopathy, unspecified: Secondary | ICD-10-CM | POA: Diagnosis present

## 2012-03-01 DIAGNOSIS — T84099A Other mechanical complication of unspecified internal joint prosthesis, initial encounter: Secondary | ICD-10-CM | POA: Diagnosis not present

## 2012-03-01 DIAGNOSIS — R29898 Other symptoms and signs involving the musculoskeletal system: Secondary | ICD-10-CM | POA: Diagnosis not present

## 2012-03-01 DIAGNOSIS — Z96619 Presence of unspecified artificial shoulder joint: Secondary | ICD-10-CM | POA: Diagnosis not present

## 2012-03-01 DIAGNOSIS — K219 Gastro-esophageal reflux disease without esophagitis: Secondary | ICD-10-CM | POA: Diagnosis present

## 2012-03-01 DIAGNOSIS — K5900 Constipation, unspecified: Secondary | ICD-10-CM | POA: Diagnosis present

## 2012-03-01 DIAGNOSIS — F3289 Other specified depressive episodes: Secondary | ICD-10-CM | POA: Diagnosis present

## 2012-03-01 DIAGNOSIS — M129 Arthropathy, unspecified: Secondary | ICD-10-CM | POA: Diagnosis present

## 2012-03-01 DIAGNOSIS — M67919 Unspecified disorder of synovium and tendon, unspecified shoulder: Secondary | ICD-10-CM | POA: Diagnosis not present

## 2012-03-01 DIAGNOSIS — D649 Anemia, unspecified: Secondary | ICD-10-CM | POA: Diagnosis not present

## 2012-03-01 DIAGNOSIS — M75120 Complete rotator cuff tear or rupture of unspecified shoulder, not specified as traumatic: Secondary | ICD-10-CM | POA: Diagnosis not present

## 2012-03-01 DIAGNOSIS — I10 Essential (primary) hypertension: Secondary | ICD-10-CM | POA: Diagnosis not present

## 2012-03-01 DIAGNOSIS — Z6841 Body Mass Index (BMI) 40.0 and over, adult: Secondary | ICD-10-CM

## 2012-03-01 DIAGNOSIS — F329 Major depressive disorder, single episode, unspecified: Secondary | ICD-10-CM | POA: Diagnosis present

## 2012-03-01 DIAGNOSIS — M75102 Unspecified rotator cuff tear or rupture of left shoulder, not specified as traumatic: Secondary | ICD-10-CM

## 2012-03-01 DIAGNOSIS — M25519 Pain in unspecified shoulder: Secondary | ICD-10-CM | POA: Diagnosis not present

## 2012-03-01 HISTORY — PX: SHOULDER OPEN ROTATOR CUFF REPAIR: SHX2407

## 2012-03-01 HISTORY — DX: Gastro-esophageal reflux disease without esophagitis: K21.9

## 2012-03-01 LAB — BASIC METABOLIC PANEL
BUN: 17 mg/dL (ref 6–23)
CO2: 29 mEq/L (ref 19–32)
Chloride: 105 mEq/L (ref 96–112)
Glucose, Bld: 95 mg/dL (ref 70–99)
Potassium: 4.9 mEq/L (ref 3.5–5.1)
Sodium: 141 mEq/L (ref 135–145)

## 2012-03-01 LAB — CBC
HCT: 36.1 % (ref 36.0–46.0)
Hemoglobin: 11.4 g/dL — ABNORMAL LOW (ref 12.0–15.0)
MCH: 29.5 pg (ref 26.0–34.0)
MCHC: 31.6 g/dL (ref 30.0–36.0)
RBC: 3.86 MIL/uL — ABNORMAL LOW (ref 3.87–5.11)

## 2012-03-01 LAB — SURGICAL PCR SCREEN
MRSA, PCR: NEGATIVE
Staphylococcus aureus: NEGATIVE

## 2012-03-01 SURGERY — REPAIR, ROTATOR CUFF, OPEN
Anesthesia: General | Site: Shoulder | Laterality: Left | Wound class: Clean

## 2012-03-01 MED ORDER — ONDANSETRON HCL 4 MG/2ML IJ SOLN
INTRAMUSCULAR | Status: DC | PRN
  Administered 2012-03-01: 4 mg via INTRAVENOUS

## 2012-03-01 MED ORDER — ACETAMINOPHEN 650 MG RE SUPP: 650.0000 mg | Freq: Four times a day (QID) | RECTAL | Status: DC | PRN

## 2012-03-01 MED ORDER — MUPIROCIN 2 % EX OINT
TOPICAL_OINTMENT | CUTANEOUS | Status: AC
  Administered 2012-03-01: 1 via NASAL
  Filled 2012-03-01: qty 22

## 2012-03-01 MED ORDER — ONDANSETRON HCL 4 MG/2ML IJ SOLN
INTRAMUSCULAR | Status: AC
  Filled 2012-03-01: qty 2

## 2012-03-01 MED ORDER — LISINOPRIL 20 MG PO TABS
20.0000 mg | ORAL_TABLET | Freq: Every day | ORAL | Status: DC
  Filled 2012-03-01 (×2): qty 1

## 2012-03-01 MED ORDER — GLYCOPYRROLATE 0.2 MG/ML IJ SOLN
INTRAMUSCULAR | Status: DC | PRN
  Administered 2012-03-01: .6 mg via INTRAVENOUS
  Administered 2012-03-01: 0.4 mg via INTRAVENOUS

## 2012-03-01 MED ORDER — PROPOFOL 10 MG/ML IV BOLUS
INTRAVENOUS | Status: DC | PRN
  Administered 2012-03-01: 150 mg via INTRAVENOUS

## 2012-03-01 MED ORDER — ROCURONIUM BROMIDE 100 MG/10ML IV SOLN
INTRAVENOUS | Status: DC | PRN
  Administered 2012-03-01: 50 mg via INTRAVENOUS
  Administered 2012-03-01: 10 mg via INTRAVENOUS

## 2012-03-01 MED ORDER — METOCLOPRAMIDE HCL 10 MG PO TABS: 5.0000 mg | ORAL_TABLET | Freq: Three times a day (TID) | ORAL | Status: DC | PRN

## 2012-03-01 MED ORDER — ONDANSETRON HCL 4 MG/2ML IJ SOLN: 4.0000 mg | Freq: Once | INTRAMUSCULAR | Status: DC | PRN

## 2012-03-01 MED ORDER — LORAZEPAM 0.5 MG PO TABS
0.5000 mg | ORAL_TABLET | Freq: Three times a day (TID) | ORAL | Status: DC
  Administered 2012-03-02: 0.5 mg via ORAL
  Filled 2012-03-01: qty 1

## 2012-03-01 MED ORDER — FUROSEMIDE 20 MG PO TABS
20.0000 mg | ORAL_TABLET | Freq: Every day | ORAL | Status: DC
  Administered 2012-03-01 – 2012-03-02 (×2): 20 mg via ORAL
  Filled 2012-03-01 (×2): qty 1

## 2012-03-01 MED ORDER — NEOSTIGMINE METHYLSULFATE 1 MG/ML IJ SOLN
INTRAMUSCULAR | Status: DC | PRN
  Administered 2012-03-01: 5 mg via INTRAVENOUS

## 2012-03-01 MED ORDER — BISACODYL 5 MG PO TBEC: 5.0000 mg | DELAYED_RELEASE_TABLET | Freq: Every day | ORAL | Status: DC | PRN

## 2012-03-01 MED ORDER — SODIUM CHLORIDE 0.9 % IR SOLN
Status: DC | PRN
  Administered 2012-03-01: 1000 mL

## 2012-03-01 MED ORDER — HYDROCODONE-ACETAMINOPHEN 5-325 MG PO TABS
1.0000 | ORAL_TABLET | ORAL | Status: DC | PRN
  Administered 2012-03-02: 2 via ORAL
  Filled 2012-03-01: qty 2

## 2012-03-01 MED ORDER — ONDANSETRON HCL 4 MG PO TABS: 4.0000 mg | ORAL_TABLET | Freq: Four times a day (QID) | ORAL | Status: DC | PRN

## 2012-03-01 MED ORDER — CEFAZOLIN SODIUM-DEXTROSE 2-3 GM-% IV SOLR
2.0000 g | INTRAVENOUS | Status: AC
  Administered 2012-03-01: 2 g via INTRAVENOUS

## 2012-03-01 MED ORDER — DIPHENHYDRAMINE HCL 12.5 MG/5ML PO ELIX: 12.5000 mg | ORAL_SOLUTION | ORAL | Status: DC | PRN

## 2012-03-01 MED ORDER — PHENYLEPHRINE HCL 10 MG/ML IJ SOLN
10.0000 mg | INTRAVENOUS | Status: DC | PRN
  Administered 2012-03-01: 10 ug/min via INTRAVENOUS

## 2012-03-01 MED ORDER — ASPIRIN EC 325 MG PO TBEC
325.0000 mg | DELAYED_RELEASE_TABLET | Freq: Two times a day (BID) | ORAL | Status: DC
  Administered 2012-03-01 – 2012-03-02 (×2): 325 mg via ORAL
  Filled 2012-03-01 (×3): qty 1

## 2012-03-01 MED ORDER — FLEET ENEMA 7-19 GM/118ML RE ENEM: 1.0000 | ENEMA | Freq: Once | RECTAL | Status: AC | PRN

## 2012-03-01 MED ORDER — FENTANYL CITRATE 0.05 MG/ML IJ SOLN
100.0000 ug | Freq: Once | INTRAMUSCULAR | Status: AC
  Administered 2012-03-01: 50 ug via INTRAVENOUS

## 2012-03-01 MED ORDER — HYDROMORPHONE HCL PF 1 MG/ML IJ SOLN: 0.2500 mg | INTRAMUSCULAR | Status: DC | PRN

## 2012-03-01 MED ORDER — LIDOCAINE HCL (CARDIAC) 20 MG/ML IV SOLN
INTRAVENOUS | Status: DC | PRN
  Administered 2012-03-01: 50 mg via INTRAVENOUS

## 2012-03-01 MED ORDER — POLYETHYLENE GLYCOL 3350 17 G PO PACK: 17.0000 g | PACK | Freq: Every day | ORAL | Status: DC | PRN

## 2012-03-01 MED ORDER — SIMVASTATIN 20 MG PO TABS
20.0000 mg | ORAL_TABLET | Freq: Every day | ORAL | Status: DC
  Filled 2012-03-01: qty 1

## 2012-03-01 MED ORDER — FENTANYL CITRATE 0.05 MG/ML IJ SOLN
INTRAMUSCULAR | Status: AC
  Filled 2012-03-01: qty 2

## 2012-03-01 MED ORDER — LACTATED RINGERS IV SOLN
INTRAVENOUS | Status: DC
  Administered 2012-03-01: 13:00:00 via INTRAVENOUS

## 2012-03-01 MED ORDER — CEFAZOLIN SODIUM-DEXTROSE 2-3 GM-% IV SOLR
INTRAVENOUS | Status: AC
  Filled 2012-03-01: qty 50

## 2012-03-01 MED ORDER — MENTHOL 3 MG MT LOZG: 1.0000 | LOZENGE | OROMUCOSAL | Status: DC | PRN

## 2012-03-01 MED ORDER — PHENOL 1.4 % MT LIQD: 1.0000 | OROMUCOSAL | Status: DC | PRN

## 2012-03-01 MED ORDER — ACETAMINOPHEN 325 MG PO TABS: 650.0000 mg | ORAL_TABLET | Freq: Four times a day (QID) | ORAL | Status: DC | PRN

## 2012-03-01 MED ORDER — OXYCODONE-ACETAMINOPHEN 5-325 MG PO TABS: 1.0000 | ORAL_TABLET | ORAL | Status: DC | PRN

## 2012-03-01 MED ORDER — DOCUSATE SODIUM 100 MG PO CAPS
100.0000 mg | ORAL_CAPSULE | Freq: Two times a day (BID) | ORAL | Status: DC
  Administered 2012-03-01 – 2012-03-02 (×2): 100 mg via ORAL
  Filled 2012-03-01 (×2): qty 1

## 2012-03-01 MED ORDER — MIDAZOLAM HCL 5 MG/5ML IJ SOLN
INTRAMUSCULAR | Status: DC | PRN
  Administered 2012-03-01 (×2): 1 mg via INTRAVENOUS

## 2012-03-01 MED ORDER — BUPIVACAINE-EPINEPHRINE PF 0.5-1:200000 % IJ SOLN
INTRAMUSCULAR | Status: DC | PRN
  Administered 2012-03-01: 15 mL

## 2012-03-01 MED ORDER — ARTIFICIAL TEARS OP OINT
TOPICAL_OINTMENT | OPHTHALMIC | Status: DC | PRN
  Administered 2012-03-01: 1 via OPHTHALMIC

## 2012-03-01 MED ORDER — MORPHINE SULFATE 2 MG/ML IJ SOLN: 2.0000 mg | INTRAMUSCULAR | Status: DC | PRN

## 2012-03-01 MED ORDER — LACTATED RINGERS IV SOLN
INTRAVENOUS | Status: DC | PRN
  Administered 2012-03-01 (×2): via INTRAVENOUS

## 2012-03-01 MED ORDER — METOCLOPRAMIDE HCL 5 MG/ML IJ SOLN: 5.0000 mg | Freq: Three times a day (TID) | INTRAMUSCULAR | Status: DC | PRN

## 2012-03-01 MED ORDER — ONDANSETRON HCL 4 MG/2ML IJ SOLN: 4.0000 mg | Freq: Four times a day (QID) | INTRAMUSCULAR | Status: DC | PRN

## 2012-03-01 MED ORDER — POVIDONE-IODINE 7.5 % EX SOLN
Freq: Once | CUTANEOUS | Status: DC
  Filled 2012-03-01: qty 118

## 2012-03-01 MED ORDER — MUPIROCIN 2 % EX OINT
TOPICAL_OINTMENT | Freq: Two times a day (BID) | CUTANEOUS | Status: DC
  Administered 2012-03-01: 1 via NASAL
  Filled 2012-03-01: qty 22

## 2012-03-01 MED ORDER — KCL IN DEXTROSE-NACL 20-5-0.45 MEQ/L-%-% IV SOLN
INTRAVENOUS | Status: DC
  Administered 2012-03-02: 03:00:00 via INTRAVENOUS
  Filled 2012-03-01 (×3): qty 1000

## 2012-03-01 MED ORDER — CEFAZOLIN SODIUM-DEXTROSE 2-3 GM-% IV SOLR
2.0000 g | Freq: Four times a day (QID) | INTRAVENOUS | Status: AC
  Administered 2012-03-01 – 2012-03-02 (×3): 2 g via INTRAVENOUS
  Filled 2012-03-01 (×3): qty 50

## 2012-03-01 MED ORDER — FUROSEMIDE 20 MG PO TABS: 20.0000 mg | ORAL_TABLET | Freq: Every day | ORAL | Status: DC

## 2012-03-01 MED ORDER — ZOLPIDEM TARTRATE 5 MG PO TABS: 5.0000 mg | ORAL_TABLET | Freq: Every evening | ORAL | Status: DC | PRN

## 2012-03-01 MED ORDER — FENTANYL CITRATE 0.05 MG/ML IJ SOLN
INTRAMUSCULAR | Status: DC | PRN
  Administered 2012-03-01: 75 ug via INTRAVENOUS
  Administered 2012-03-01: 25 ug via INTRAVENOUS
  Administered 2012-03-01: 50 ug via INTRAVENOUS

## 2012-03-01 SURGICAL SUPPLY — 57 items
BLADE SAW SAG 73X25 THK (BLADE) ×1
BLADE SAW SGTL 73X25 THK (BLADE) ×1 IMPLANT
BOWL SMART MIX CTS (DISPOSABLE) IMPLANT
CHLORAPREP W/TINT 26ML (MISCELLANEOUS) ×2 IMPLANT
CLOTH BEACON ORANGE TIMEOUT ST (SAFETY) ×2 IMPLANT
COVER SURGICAL LIGHT HANDLE (MISCELLANEOUS) ×2 IMPLANT
DRAPE INCISE IOBAN 66X45 STRL (DRAPES) ×2 IMPLANT
DRAPE U-SHAPE 47X51 STRL (DRAPES) ×2 IMPLANT
DRSG ADAPTIC 3X8 NADH LF (GAUZE/BANDAGES/DRESSINGS) ×2 IMPLANT
DRSG MEPILEX BORDER 4X12 (GAUZE/BANDAGES/DRESSINGS) ×2 IMPLANT
DRSG PAD ABDOMINAL 8X10 ST (GAUZE/BANDAGES/DRESSINGS) ×4 IMPLANT
ELECT REM PT RETURN 9FT ADLT (ELECTROSURGICAL) ×2
ELECTRODE REM PT RTRN 9FT ADLT (ELECTROSURGICAL) ×1 IMPLANT
EVACUATOR 1/8 PVC DRAIN (DRAIN) IMPLANT
GLOVE BIO SURGEON STRL SZ7 (GLOVE) ×2 IMPLANT
GLOVE BIO SURGEON STRL SZ7.5 (GLOVE) ×2 IMPLANT
GLOVE BIOGEL PI IND STRL 8 (GLOVE) ×1 IMPLANT
GLOVE BIOGEL PI INDICATOR 8 (GLOVE) ×1
GOWN PREVENTION PLUS LG XLONG (DISPOSABLE) ×2 IMPLANT
GOWN STRL NON-REIN LRG LVL3 (GOWN DISPOSABLE) ×4 IMPLANT
HEMOSTAT SURGICEL 2X14 (HEMOSTASIS) IMPLANT
KIT BASIN OR (CUSTOM PROCEDURE TRAY) ×2 IMPLANT
KIT ROOM TURNOVER OR (KITS) ×2 IMPLANT
LOOP FIBERTAPE 2MM 8 WHT BLK (VASCULAR PRODUCTS) ×4 IMPLANT
MANIFOLD NEPTUNE II (INSTRUMENTS) ×2 IMPLANT
NDL 1/2 CIR MAYO (NEEDLE) ×1 IMPLANT
NDL HYPO 25GX1X1/2 BEV (NEEDLE) ×1 IMPLANT
NDL SUT .5 MAYO 1.404X.05X (NEEDLE) ×1 IMPLANT
NEEDLE 1/2 CIR MAYO (NEEDLE) ×2 IMPLANT
NEEDLE HYPO 25GX1X1/2 BEV (NEEDLE) ×2 IMPLANT
NEEDLE MAYO TAPER (NEEDLE) ×2
NS IRRIG 1000ML POUR BTL (IV SOLUTION) ×2 IMPLANT
PACK SHOULDER (CUSTOM PROCEDURE TRAY) ×2 IMPLANT
PAD ARMBOARD 7.5X6 YLW CONV (MISCELLANEOUS) ×4 IMPLANT
RETRIEVER SUT HEWSON (MISCELLANEOUS) IMPLANT
SLING ARM IMMOBILIZER LRG (SOFTGOODS) ×2 IMPLANT
SLING ARM IMMOBILIZER MED (SOFTGOODS) IMPLANT
SPONGE GAUZE 4X4 12PLY (GAUZE/BANDAGES/DRESSINGS) ×2 IMPLANT
SPONGE LAP 18X18 X RAY DECT (DISPOSABLE) ×2 IMPLANT
STRIP CLOSURE SKIN 1/2X4 (GAUZE/BANDAGES/DRESSINGS) ×2 IMPLANT
SUCTION FRAZIER TIP 10 FR DISP (SUCTIONS) ×2 IMPLANT
SUT FIBERWIRE #2 38 T-5 BLUE (SUTURE) ×16
SUT MNCRL AB 4-0 PS2 18 (SUTURE) ×2 IMPLANT
SUT SILK 2 0 TIES 17X18 (SUTURE) ×2
SUT SILK 2-0 18XBRD TIE BLK (SUTURE) ×1 IMPLANT
SUT TIGER TAPE 7 IN WHITE (SUTURE) ×1 IMPLANT
SUT VIC AB 0 CTB1 27 (SUTURE) ×4 IMPLANT
SUT VIC AB 2-0 CT1 27 (SUTURE) ×6
SUT VIC AB 2-0 CT1 TAPERPNT 27 (SUTURE) ×3 IMPLANT
SUTURE FIBERWR #2 38 T-5 BLUE (SUTURE) ×8 IMPLANT
SWABSTICK BENZOIN STERILE (MISCELLANEOUS) ×2 IMPLANT
SYR CONTROL 10ML LL (SYRINGE) ×2 IMPLANT
TOWEL OR 17X24 6PK STRL BLUE (TOWEL DISPOSABLE) ×2 IMPLANT
TOWEL OR 17X26 10 PK STRL BLUE (TOWEL DISPOSABLE) ×2 IMPLANT
TRAY FOLEY CATH 14FR (SET/KITS/TRAYS/PACK) IMPLANT
WATER STERILE IRR 1000ML POUR (IV SOLUTION) ×2 IMPLANT
YANKAUER SUCT BULB TIP NO VENT (SUCTIONS) ×2 IMPLANT

## 2012-03-01 NOTE — Anesthesia Procedure Notes (Addendum)
Anesthesia Regional Block:  Interscalene brachial plexus block  Pre-Anesthetic Checklist: ,, timeout performed, Correct Patient, Correct Site, Correct Laterality, Correct Procedure, Correct Position, site marked, Risks and benefits discussed,  Surgical consent,  Pre-op evaluation,  At surgeon's request and post-op pain management  Laterality: Left  Prep: Maximum Sterile Barrier Precautions used, chloraprep and alcohol swabs       Needles:  Injection technique: Single-shot  Needle Type: Stimulator Needle - 40        Needle insertion depth: 3 cm   Additional Needles:  Procedures: nerve stimulator Interscalene brachial plexus block  Nerve Stimulator or Paresthesia:  Response: 0.5 mA, 0.1 ms, 3 cm  Additional Responses:   Narrative:  Start time: 03/01/2012 1:45 PM End time: 03/01/2012 1:54 PM Injection made incrementally with aspirations every 5 mL.  Performed by: Personally  Anesthesiologist: Maren Beach MD  Additional Notes: Pt accepts procedure and risks. 15cc 0.5% Marcaine w/ epi w/o discomfort or difficulty. Pt tolerated well.  Feliberto Gottron MD   Procedure Name: Intubation Date/Time: 03/01/2012 2:58 PM Performed by: Luster Landsberg Pre-anesthesia Checklist: Patient identified, Emergency Drugs available, Suction available and Patient being monitored Patient Re-evaluated:Patient Re-evaluated prior to inductionOxygen Delivery Method: Circle system utilized Preoxygenation: Pre-oxygenation with 100% oxygen Intubation Type: IV induction Ventilation: Mask ventilation without difficulty and Oral airway inserted - appropriate to patient size Laryngoscope Size: Mac and 3 Grade View: Grade II Tube type: Oral Tube size: 7.5 mm Number of attempts: 2 Airway Equipment and Method: Stylet Placement Confirmation: ETT inserted through vocal cords under direct vision,  positive ETCO2 and breath sounds checked- equal and bilateral Secured at: 21 cm Tube secured with: Tape Dental  Injury: Teeth and Oropharynx as per pre-operative assessment  Comments: DVL x1 by CRNA with esophageal intubation.  D/c and DVL by Dr. Chaney Malling - + ETCO2.

## 2012-03-01 NOTE — Progress Notes (Signed)
Pt in PACU at 1655. RT at bedside. Switched from ambu-bag to vent. Pt. Bucking vent. Able to lift head. Grips strong. VSS. Extubated by CRNA. O2 via simple mask applied. Pt sleeping at times, but easily aroused. VS remain stable.

## 2012-03-01 NOTE — Anesthesia Postprocedure Evaluation (Signed)
Anesthesia Post Note  Patient: [name]  Procedure(s) Performed: Procedure(s) (LRB): ROTATOR CUFF REPAIR SHOULDER OPEN (Left)  Anesthesia type: General  Patient location: PACU  Post pain: Pain level controlled and Adequate analgesia  Post assessment: Post-op Vital signs reviewed, Patient's Cardiovascular Status Stable, Respiratory Function Stable, Patent Airway and Pain level controlled  Last Vitals:  Filed Vitals:   03/01/12 1726  BP:   Pulse: 59  Temp:   Resp: 20    Post vital signs: Reviewed and stable  Level of consciousness: awake, alert  and oriented  Complications: No apparent anesthesia complications

## 2012-03-01 NOTE — Preoperative (Signed)
Beta Blockers   Reason not to administer Beta Blockers: not prescribed 

## 2012-03-01 NOTE — Transfer of Care (Signed)
Immediate Anesthesia Transfer of Care Note  Patient: [name]  Procedure(s) Performed: Procedure(s) (LRB) with comments: ROTATOR CUFF REPAIR SHOULDER OPEN (Left) - SUBSCAPULARIS REPAIR  Patient Location: PACU  Anesthesia Type:General  Level of Consciousness: awake and alert   Airway & Oxygen Therapy: Patient Spontanous Breathing and Patient connected to face mask oxygen  Post-op Assessment: Report given to PACU RN and Post -op Vital signs reviewed and stable  Post vital signs: Reviewed and stable  Complications: No apparent anesthesia complications

## 2012-03-01 NOTE — Progress Notes (Signed)
Pt arrived to PACU from OR intubated and placed on vent. Pt was waking up upon arrival, therefore, placed on PS/CPAP. CRNA then called MD to suggest extubation. Pt then extubated by CRNA within about 5 minutes of being placed on vent. RT will continue to monitor.

## 2012-03-01 NOTE — Anesthesia Preprocedure Evaluation (Addendum)
Anesthesia Evaluation  Patient identified by MRN, date of birth, ID band Patient awake    Reviewed: Allergy & Precautions, H&P , NPO status , Patient's Chart, lab work & pertinent test results  History of Anesthesia Complications Negative for: history of anesthetic complications  Airway Mallampati: II TM Distance: >3 FB     Dental  (+) Dental Advisory Given and Poor Dentition   Pulmonary          Cardiovascular hypertension, Pt. on medications Rhythm:Regular Rate:Normal     Neuro/Psych PSYCHIATRIC DISORDERS Anxiety Depression negative neurological ROS     GI/Hepatic Neg liver ROS, GERD-  Controlled,  Endo/Other  negative endocrine ROS  Renal/GU negative Renal ROS  negative genitourinary   Musculoskeletal  (+) Arthritis -, Osteoarthritis,    Abdominal   Peds  Hematology negative hematology ROS (+)   Anesthesia Other Findings   Reproductive/Obstetrics negative OB ROS                          Anesthesia Physical Anesthesia Plan  ASA: II  Anesthesia Plan: General   Post-op Pain Management:    Induction: Intravenous  Airway Management Planned: Oral ETT  Additional Equipment:   Intra-op Plan:   Post-operative Plan: Extubation in OR  Informed Consent: I have reviewed the patients History and Physical, chart, labs and discussed the procedure including the risks, benefits and alternatives for the proposed anesthesia with the patient or authorized representative who has indicated his/her understanding and acceptance.   Dental advisory given  Plan Discussed with: Anesthesiologist and Surgeon  Anesthesia Plan Comments:       Anesthesia Quick Evaluation

## 2012-03-01 NOTE — Op Note (Signed)
Procedure(s): ROTATOR CUFF REPAIR SHOULDER OPEN Procedure Note  [name] female 71 y.o. 03/01/2012  Procedure(s) and Anesthesia Type:    * ROTATOR CUFF REPAIR LEFT SHOULDER  for failed subscapularis repair after total shoulder placed  OPEN   Surgeon(s) and Role:    * Mable Paris, MD - Primary   Indications:  71 y.o. female     who underwent left total shoulder replacement approximately 6 weeks ago. Postoperatively she was noted to have failure of the subscapularis repair with significant weakness and dysfunction. She was indicated for return to the operating room to try and repair the subscapularis to restore function.  Surgeon: Mable Paris   Assistants: Damita Lack PA-C [facility] was present and scrubbed throughout the procedure and was essential in positioning, retraction, exposure, and closure)  Anesthesia: General endotracheal anesthesia with preoperative interscalene block   Procedure Detail  Findings: The lesser tuberosity osteotomy had failed and retracted to the level of the mid humeral head. She was also noted to have a fracture of the anterior greater tuberosity with retraction of the anterior supraspinatus. The head was subluxated anteriorly. Glenoid appeared to be in good position with no adverse wear. The supraspinatus with a small bony fragment was able to be repaired using a fiber tape in reverse mattress configuration through bone tunnels.  The subscapularis was released for further excursion and ultimately was able to be repaired back to its anatomic bed using 2 fiber tapes using bone tunnels. The repair was done in about 10 of internal rotation.  Estimated Blood Loss:  less than 100 mL         Drains: 1 medium hemovac  Blood Given: none          Specimens: none        Complications:  * No complications entered in OR log *         Disposition: PACU - hemodynamically stable.         Condition: stable     Procedure:   The patient was identified in the preoperative holding area where I personally marked the operative extremity after verifying with the patient and consent. She  was taken to the operating room where She was transferred to the   operative table.  The patient received an interscalene block in   the holding area by the attending anesthesiologist.  General anesthesia was induced   in the operating room without complication.  The patient did receive IV  Ancef prior to the commencement of the procedure.  The patient was   placed in the beach-chair position with the back raised about 30   degrees.  The nonoperative extremity and head and neck were carefully   positioned and padded protecting against neurovascular compromise.  The   left upper extremity was then prepped and draped in the standard sterile   fashion.    The appropriate operative time-out was performed with   Anesthesia, the perioperative staff, as well as myself and we all agreed   that the left side was the correct operative site.  An approximately   10 cm incision was made from the tip of the coracoid to the center point of the   humerus at the level of the axilla using the previous incision.  Dissection was carried down through subcutaneous fat to the level of the deltopectoral interval. The cephalic vein was identified and taken laterally with the deltoid. Careful dissection was used to reestablish all the planes initially established during surgery.  The pectoralis major was taken medially and the deltoid laterally. The conjoined tendon was identified and had significant fraying laterally and to an anterior subluxation of the head. It was noted that there was a fragment of bone superiorly over the humeral head in the joint. When I mobilize this and realized that it was attached to the anterior supraspinatus that there was in fact a defect in the anterior greater tuberosity for this pulled off from. This still had the  Ethibond suture that had been used in the closure of the rotator interval.  It easily was able to be brought back to the anterior greater tuberosity. Therefore I repaired the bed by using a rongeur and repaired it using a fiber tape in an inverted mattress configuration bringing the sutures out the lateral tuberosity through bone tunnels using a sharp free needle. The lesser tuberosity was then identified anteriorly and was carefully dissected from the surrounding tissues including the undersurface of the conjoined tendon. Retractor was placed beneath the conjoined tendon a Cobb elevator was used to gently free up adhesions anterior superior and posterior to the subscapularis taking great care not to injure the axillary nerve inferiorly. Once it was adequately mobilized 2 fiber tapes were passed through the bone tendon junction with one superior and one inferior. The lesser tuberosity was then held in reduced position while each was passed through bone tunnels again using the sharp free needle. Sutures were tied anteriorly securing the lesser tuberosity back to its bony bed. The repair was done in about 10 of internal rotation. The repair was fairly tight in this position without excess excursion. At this point the wound was copiously irrigated with normal saline and subsequently closed in layers with 2-0 Vicryl in a deep layer and 4-0 Monocryl for skin closure. Sterile dressings were then applied. The patient was placed in a sling immobilizer and then allowed to awaken from general anesthesia, transferred to stretcher and taken to the recovery room in stable condition.    POSTOPERATIVE PLAN: She will be admitted overnight for pain control and antibiotics and likely discharged in the morning. She will remain in a sling immobilizer for approximately 6 weeks to allow this to heal. We can begin some gentle pendulum exercises as things begin to heal.

## 2012-03-01 NOTE — H&P (Signed)
[name] is an 71 y.o. female.   Chief Complaint: L shoulder weakness HPI:  S/p L shoulder TSA ~ 6 wks ago, with anterior subluxation on XR and weakness with subscapularis testing.  Past Medical History  Diagnosis Date  . Anemia     on iron  . Hypertension   . Hyperlipidemia     on med  . Anxiety   . Arthritis     shoulders, knees, hands, hips  . Depression   . Pneumonia 2003  . Hyperlipemia   . Constipation     Past Surgical History  Procedure Date  . Wisdon teeth ext   . Svd     x 7  . Tubal ligation   . Knee surgery 11/2001    left knee  . Replacement total knee 10/2009    left knee  . Carpel tunnel release     left hand  . Colonscopy   . Joint replacement 10/2009    left knee  . Total shoulder replacement 01/19/2012    left shoulder  . Total shoulder arthroplasty 01/18/2012    Procedure: TOTAL SHOULDER ARTHROPLASTY;  Surgeon: Mable Paris, MD;  Location: [facility] OR;  Service: Orthopedics;  Laterality: Left;    History reviewed. No pertinent family history. Social History:  reports that she quit smoking about 28 years ago. Her smoking use included Cigarettes. She has a 25 pack-year smoking history. She has quit using smokeless tobacco. She reports that she does not drink alcohol or use illicit drugs.  Allergies:  Allergies  Allergen Reactions  . Sulfa Drugs Cross Reactors Itching    Medications Prior to Admission  Medication Sig Dispense Refill  . aspirin EC 81 MG tablet Take 81 mg by mouth daily.      . furosemide (LASIX) 20 MG tablet Take 20 mg by mouth daily.      . furosemide (LASIX) 20 MG tablet Take 1 tablet (20 mg total) by mouth daily.  30 tablet  3  . IRON PO Take 55 mg by mouth daily with breakfast. Per pt report      . lisinopril (PRINIVIL,ZESTRIL) 20 MG tablet Take 20 mg by mouth daily.      Marland Kitchen LORazepam (ATIVAN) 0.5 MG tablet Take 0.5 mg by mouth every 8 (eight) hours. For anxiety      . oxyCODONE-acetaminophen (ROXICET) 5-325 MG per  tablet Take 1-2 tablets by mouth every 4 (four) hours as needed for pain.  60 tablet  0  . pravastatin (PRAVACHOL) 40 MG tablet Take 40 mg by mouth 2 (two) times daily.      . pravastatin (PRAVACHOL) 40 MG tablet Take 1 tablet (40 mg total) by mouth 2 (two) times daily.  60 tablet  3  . traMADol (ULTRAM) 50 MG tablet Take 100 mg by mouth every 6 (six) hours as needed. Maximum dose= 8 tablets per day Prn pain        Results for orders placed during the hospital encounter of 03/01/12 (from the past 48 hour(s))  SURGICAL PCR SCREEN     Status: Normal   Collection Time   03/01/12 11:39 AM      Component Value Range Comment   MRSA, PCR NEGATIVE  NEGATIVE    Staphylococcus aureus NEGATIVE  NEGATIVE   BASIC METABOLIC PANEL     Status: Normal   Collection Time   03/01/12 11:57 AM      Component Value Range Comment   Sodium 141  135 - 145 mEq/L  Potassium 4.9  3.5 - 5.1 mEq/L    Chloride 105  96 - 112 mEq/L    CO2 29  19 - 32 mEq/L    Glucose, Bld 95  70 - 99 mg/dL    BUN 17  6 - 23 mg/dL    Creatinine, Ser 6.04  0.50 - 1.10 mg/dL    Calcium 9.9  8.4 - 54.0 mg/dL    GFR calc non Af Amer >90  >90 mL/min    GFR calc Af Amer >90  >90 mL/min   CBC     Status: Abnormal   Collection Time   03/01/12 11:57 AM      Component Value Range Comment   WBC 8.6  4.0 - 10.5 K/uL    RBC 3.86 (*) 3.87 - 5.11 MIL/uL    Hemoglobin 11.4 (*) 12.0 - 15.0 g/dL    HCT 98.1  19.1 - 47.8 %    MCV 93.5  78.0 - 100.0 fL    MCH 29.5  26.0 - 34.0 pg    MCHC 31.6  30.0 - 36.0 g/dL    RDW 29.5  62.1 - 30.8 %    Platelets 293  150 - 400 K/uL    No results found.  Review of Systems  All other systems reviewed and are negative.    Blood pressure 115/77, pulse 77, temperature 97.9 F (36.6 C), temperature source Oral, resp. rate 18, SpO2 97.00%. Physical Exam  Constitutional: She is oriented to person, place, and time. She appears well-developed and well-nourished.  HENT:  Head: Atraumatic.  Eyes: EOM are  normal.  Cardiovascular: Intact distal pulses.   Respiratory: Effort normal.  Musculoskeletal:       R shoulder pos abdominal compression test  Neurological: She is alert and oriented to person, place, and time.  Skin: Skin is warm and dry.  Psychiatric: She has a normal mood and affect.     Assessment/Plan Failed L shoulder subscapularis repair after TSA Plan open repair Risks / benefits of surgery discussed Consent on chart  NPO for OR Preop antibiotics   Jameah Rouser WILLIAM 03/01/2012, 2:37 PM

## 2012-03-01 NOTE — OR Nursing (Signed)
Care assumed at this time.

## 2012-03-02 ENCOUNTER — Encounter (HOSPITAL_COMMUNITY): Payer: Self-pay | Admitting: General Practice

## 2012-03-02 DIAGNOSIS — M75102 Unspecified rotator cuff tear or rupture of left shoulder, not specified as traumatic: Secondary | ICD-10-CM

## 2012-03-02 LAB — CBC
Hemoglobin: 10.5 g/dL — ABNORMAL LOW (ref 12.0–15.0)
RBC: 3.65 MIL/uL — ABNORMAL LOW (ref 3.87–5.11)
WBC: 9.3 10*3/uL (ref 4.0–10.5)

## 2012-03-02 LAB — BASIC METABOLIC PANEL
CO2: 27 mEq/L (ref 19–32)
Glucose, Bld: 90 mg/dL (ref 70–99)
Potassium: 3.7 mEq/L (ref 3.5–5.1)
Sodium: 139 mEq/L (ref 135–145)

## 2012-03-02 MED ORDER — OXYCODONE-ACETAMINOPHEN 5-325 MG PO TABS: ORAL_TABLET | ORAL | Status: DC

## 2012-03-02 NOTE — Progress Notes (Signed)
PATIENT ID: [name]   1 Day Post-Op Procedure(s) (LRB): ROTATOR CUFF REPAIR SHOULDER OPEN (Left)  Subjective: Patient feels well. Slept well overnight. Pain is well controlled. No complaints or concerns. Denies dizziness, lightheadedness.   Objective:  Filed Vitals:   03/02/12 0558  BP: 86/50  Pulse: 74  Temp: 98.2 F (36.8 C)  Resp: 18     A&O L UE dressings with small drops dried blood. Clean and intact Wiggles fingers, intact sensation to light touch. R/M/U nerves intact Arm in sling   Labs:   Basename 03/02/12 0415 03/01/12 1157  HGB 10.5* 11.4*   Basename 03/02/12 0415 03/01/12 1157  WBC 9.3 8.6  RBC 3.65* 3.86*  HCT 33.3* 36.1  PLT 258 293   Basename 03/02/12 0415 03/01/12 1157  NA 139 141  K 3.7 4.9  CL 103 105  CO2 27 29  BUN 13 17  CREATININE 0.59 0.59  GLUCOSE 90 95  CALCIUM 9.0 9.9    Assessment and Plan: 1 Day Post-Op open rotator cuff repair left shoulder Pain well controlled, feeling good Last BP was hypotensive, shes asymptomatic. Will recheck BP and hold off on BP meds and see if it improves. When BP is controlled (systolic >100) she can go home.  Okay to discharge today when BP controlled Must apply waist strap to sling before going home D/c with Percocet 5-325 1-2 po q 4-6 hours  F/u Dr. Ave Filter in 7-10 days   VTE proph: ASA 325mg  BID, SCDs

## 2012-03-02 NOTE — Progress Notes (Signed)
Occupational Therapy Evaluation Patient Details Name: [name] MRN: [id number] DOB: 05/25/1940 Today's Date: 03/02/2012 Time: 4696-2952 OT Time Calculation (min): 20 min  OT Assessment / Plan / Recommendation Clinical Impression  71 yo s/p RTC repair. Pt will benefit from skilled OT services to max independence with ADL and LUE management according to RTC protocol. Pt will be able to D/C home with support of family and friends. Will f/u with Dr. Ave Filter and progress with therapy as needed.     OT Assessment  Patient needs continued OT Services    Follow Up Recommendations  No OT follow up    Barriers to Discharge None    Equipment Recommendations  None recommended by OT    Recommendations for Other Services  none  Frequency  Min 2X/week    Precautions / Restrictions Precautions Precautions: Shoulder Type of Shoulder Precautions: no shoulder ROm. E,W H ROM only Precaution Booklet Issued: Yes (comment) Required Braces or Orthoses: Other Brace/Splint (sling) Restrictions Weight Bearing Restrictions: No   Pertinent Vitals/Pain 3/10. No request for pain meds    ADL  Eating/Feeding: Set up Grooming: Minimal assistance Upper Body Bathing: Minimal assistance Lower Body Bathing: Moderate assistance Where Assessed - Lower Body Bathing: Unsupported sit to stand Upper Body Dressing: Moderate assistance Where Assessed - Upper Body Dressing: Unsupported sitting Lower Body Dressing: Moderate assistance Where Assessed - Lower Body Dressing: Unsupported sit to stand Toilet Transfer: Minimal assistance Transfers/Ambulation Related to ADLs: S ADL Comments: needs review of shoulder precautions during ADL    OT Diagnosis: Generalized weakness;Acute pain  OT Problem List: Decreased strength;Decreased range of motion;Decreased safety awareness;Decreased knowledge of precautions;Obesity;Impaired UE functional use;Pain OT Treatment Interventions: Self-care/ADL training;Therapeutic  exercise;Therapeutic activities;Patient/family education   OT Goals Acute Rehab OT Goals OT Goal Formulation: With patient Time For Goal Achievement: 03/09/12 Potential to Achieve Goals: Good ADL Goals Additional ADL Goal #1: Pt will independently verbalize technique for donning/doffing shirt and sling. ADL Goal: Additional Goal #1 - Progress: Goal set today Additional ADL Goal #2: Pt with verbalize appropriate use of sling. ADL Goal: Additional Goal #2 - Progress: Goal set today Arm Goals Pt Will Perform AROM: Independently;Left upper extremity;1 set;to maintain range of motion (L elbow, wrist, hand only) Arm Goal: AROM - Progress: Goal set today Additional Arm Goal #1: Pt will verbalize precautions LUE, including nonweightbearing, no pushing, pulling or lifting and no shoulder ROM. Arm Goal: Additional Goal #1 - Progress: Goal set today  Visit Information  Last OT Received On: 03/02/12 Assistance Needed: +1    Subjective Data      Prior Functioning     Home Living Lives With: Family Available Help at Discharge: Family;Available 24 hours/day Type of Home: House Home Access: Stairs to enter Home Layout: One level Bathroom Shower/Tub: Health visitor: Standard Bathroom Accessibility: Yes How Accessible: Accessible via walker Home Adaptive Equipment: Bedside commode/3-in-1;Shower chair with back;Straight cane Prior Function Level of Independence: Needs assistance Needs Assistance: Bathing Bath: Minimal Able to Take Stairs?: Yes Driving: No Vocation: Retired Comments: uses cane for ambulation Communication Communication: No difficulties Dominant Hand: Right         Vision/Perception     Cognition  Overall Cognitive Status: Appears within functional limits for tasks assessed/performed Arousal/Alertness: Awake/alert Orientation Level: Appears intact for tasks assessed Behavior During Session: [name] for tasks performed    Extremity/Trunk  Assessment Right Upper Extremity Assessment RUE ROM/Strength/Tone: Within functional levels RUE Sensation: WFL - Light Touch;WFL - Proprioception RUE Coordination: WFL - gross/fine  motor Left Upper Extremity Assessment LUE ROM/Strength/Tone: Deficits;Due to pain;Due to precautions LUE ROM/Strength/Tone Deficits: no ROM shoulder LUE Sensation: WFL - Light Touch;WFL - Proprioception LUE Coordination: WFL - fine motor Right Lower Extremity Assessment RLE ROM/Strength/Tone: WFL for tasks assessed Left Lower Extremity Assessment LLE ROM/Strength/Tone: WFL for tasks assessed Trunk Assessment Trunk Assessment: Normal     Mobility Bed Mobility Bed Mobility: Supine to Sit Supine to Sit: 5: Supervision Transfers Transfers: Sit to Stand;Stand to Sit Sit to Stand: 4: Min guard Stand to Sit: 4: Min guard     Shoulder Instructions  Reviewed shoulder protocol   Exercise     Balance Balance Balance Assessed:  (WFl for ADL)   End of Session OT - End of Session Activity Tolerance: Patient tolerated treatment well Patient left: in bed;with call bell/phone within reach Nurse Communication: Mobility status;Weight bearing status;Precautions  GO     [name],[name] 03/02/2012, 10:27 AM [name], OTR/L  [phone] 03/02/2012

## 2012-03-02 NOTE — Progress Notes (Signed)
Utilization review completed. Rickiya Picariello, RN, BSN. 

## 2012-03-02 NOTE — Progress Notes (Signed)
Occupational Therapy Treatment Patient Details Name: [name] MRN: [id number] DOB: [birth year] Today's Date: 03/02/2012 Time: 0930-1002 OT Time Calculation (min): 32 min  OT Assessment / Plan / Recommendation Comments on Treatment Session Excellent participation. all acute goals met. shoulder protocol reviewed and understood. will continue with outpt therapy as needed per Dr. Ave Filter.  appropriate for D/C home with family support.    Follow Up Recommendations  No OT follow up    Barriers to Discharge  None    Equipment Recommendations  None recommended by OT    Recommendations for Other Services  none  Frequency Min 2X/week   Plan Discharge plan remains appropriate    Precautions / Restrictions Precautions Precautions: Shoulder Type of Shoulder Precautions: no shoulder ROm. E,W H ROM only Precaution Booklet Issued: Yes (comment) Precaution Comments: Pt able to verbalize precautions Required Braces or Orthoses: Other Brace/Splint Other Brace/Splint: sling Restrictions Weight Bearing Restrictions: Yes LUE Weight Bearing: Non weight bearing   Pertinent Vitals/Pain 3/10    ADL  Eating/Feeding: Set up Grooming: Minimal assistance Upper Body Bathing: Minimal assistance Lower Body Bathing: Moderate assistance Where Assessed - Lower Body Bathing: Unsupported sit to stand Upper Body Dressing: Moderate assistance Where Assessed - Upper Body Dressing: Unsupported sitting Lower Body Dressing: Moderate assistance Where Assessed - Lower Body Dressing: Unsupported sit to stand Toilet Transfer: Minimal assistance Transfers/Ambulation Related to ADLs: S ADL Comments: Completed ADL session with pt teaching shoulder precautions. Pt able toinstruct appropriately. completed L E,W H AROM without c/o pain. written protocol reviewed again using TEACH back method.     OT Diagnosis: Generalized weakness;Acute pain  OT Problem List: Decreased strength;Decreased range of motion;Decreased  safety awareness;Decreased knowledge of precautions;Obesity;Impaired UE functional use;Pain OT Treatment Interventions: Self-care/ADL training;Therapeutic exercise;Therapeutic activities;Patient/family education   OT Goals Acute Rehab OT Goals OT Goal Formulation: With patient Time For Goal Achievement: 03/09/12 Potential to Achieve Goals: Good ADL Goals Additional ADL Goal #1: Pt will independently verbalize technique for donning/doffing shirt and sling. ADL Goal: Additional Goal #1 - Progress: Met Additional ADL Goal #2: Pt with verbalize appropriate use of sling. ADL Goal: Additional Goal #2 - Progress: Met Arm Goals Pt Will Perform AROM: Independently;Left upper extremity;1 set;to maintain range of motion Arm Goal: AROM - Progress: Met Additional Arm Goal #1: Pt will verbalize precautions LUE, including nonweightbearing, no pushing, pulling or lifting and no shoulder ROM. Arm Goal: Additional Goal #1 - Progress: Met  Visit Information  Last OT Received On: 03/02/12 Assistance Needed: +1    Subjective Data      Prior Functioning  Home Living Lives With: Family Available Help at Discharge: Family;Available 24 hours/day Type of Home: House Home Access: Stairs to enter Home Layout: One level Bathroom Shower/Tub: Health visitor: Standard Bathroom Accessibility: Yes How Accessible: Accessible via walker Home Adaptive Equipment: Bedside commode/3-in-1;Shower chair with back;Straight cane Prior Function Level of Independence: Needs assistance Needs Assistance: Bathing Bath: Minimal Able to Take Stairs?: Yes Driving: No Vocation: Retired Comments: uses cane for ambulation Communication Communication: No difficulties Dominant Hand: Right    Cognition  Overall Cognitive Status: Appears within functional limits for tasks assessed/performed Arousal/Alertness: Awake/alert Orientation Level: Appears intact for tasks assessed Behavior During Session: [facility] for  tasks performed    Mobility  Shoulder Instructions Bed Mobility Bed Mobility: Supine to Sit Supine to Sit: 5: Supervision Transfers Transfers: Sit to Stand;Stand to Sit Sit to Stand: 4: Min guard;Other (comment) (usually uses cane) Stand to Sit: 4: Min guard  Shoulder protocol reviewed. Elbow, wrist hand ROM WFL.    Exercises      Balance Balance Balance Assessed:  (WFl for ADL)   End of Session OT - End of Session Activity Tolerance: Patient tolerated treatment well Patient left: in chair;with call bell/phone within reach Nurse Communication: Other (comment) (ready for D/C )  GO     Shahla Betsill,[name] 03/02/2012, 10:33 AM [name], OTR/L  [phone] 03/02/2012

## 2012-03-02 NOTE — Discharge Summary (Signed)
Patient ID: [name] MRN: [id number] DOB/AGE: May 08, 1940 71 y.o.  Admit date: 03/01/2012 Discharge date: 03/02/2012  Admission Diagnoses:  Principal Problem:  *Rotator cuff tear, left   Discharge Diagnoses:  Same  Past Medical History  Diagnosis Date  . Anemia     on iron  . Hypertension   . Hyperlipidemia     on med  . Anxiety   . Arthritis     shoulders, knees, hands, hips  . Depression   . Pneumonia 2003  . Hyperlipemia   . Constipation   . GERD (gastroesophageal reflux disease)     TAKES TUMS    Surgeries: Procedure(s): ROTATOR CUFF REPAIR SHOULDER OPEN on 03/01/2012   Consultants:    Discharged Condition: Improved  Hospital Course: PAW KARSTENS is an 71 y.o. female who was admitted 03/01/2012 for operative treatment of a left rotator cuff tear and left total shoulder replacement approximately 6 weeks ago. Postoperatively she was noted to have failure of the subscapularis repair with significant weakness and dysfunction. She was indicated for return to the operating room to try and repair the subscapularis to restore function.  Patient has severe unremitting pain that affects sleep, daily activities, and work/hobbies. After pre-op clearance the patient was taken to the operating room on 03/01/2012 and underwent  Procedure(s): ROTATOR CUFF REPAIR SHOULDER OPEN.    Patient was given perioperative antibiotics: Anti-infectives     Start     Dose/Rate Route Frequency Ordered Stop   03/01/12 1830   ceFAZolin (ANCEF) IVPB 2 g/50 mL premix        2 g 100 mL/hr over 30 Minutes Intravenous Every 6 hours 03/01/12 1813 03/02/12 0922   03/01/12 1115   ceFAZolin (ANCEF) 2-3 GM-% IVPB SOLR     Comments: [place] Large: cabinet override         03/01/12 1115 03/01/12 2329   03/01/12 1112   ceFAZolin (ANCEF) IVPB 2 g/50 mL premix        2 g 100 mL/hr over 30 Minutes Intravenous 60 min pre-op 03/01/12 1112 03/01/12 1500           Patient was given sequential  compression devices, early ambulation, and ASA 325mg  BID to prevent DVT.  Patient benefited maximally from hospital stay and there were no complications.    Recent vital signs: Patient Vitals for the past 24 hrs:  BP Temp Temp src Pulse Resp SpO2 Height Weight  03/02/12 1020 117/70 mmHg - - - - - - -  03/02/12 0558 86/50 mmHg 98.2 F (36.8 C) Oral 74  18  97 % 5\' 3"  (1.6 m) 109.861 kg (242 lb 3.2 oz)  03/01/12 2034 102/55 mmHg 97.3 F (36.3 C) Axillary 63  16  100 % - -  03/01/12 1814 128/67 mmHg 97.5 F (36.4 C) - 57  - 100 % - -  03/01/12 1759 - - - 61  14  100 % - -  03/01/12 1758 - - - 63  16  100 % - -  03/01/12 1757 - - - 60  18  100 % - -  03/01/12 1756 - - - 56  15  100 % - -  03/01/12 1755 - - - 57  16  100 % - -  03/01/12 1726 - - - 59  20  100 % - -  03/01/12 1725 - - - 60  20  100 % - -  03/01/12 1724 139/72 mmHg - - 62  19  100 % - -  Mar 04, 2012 1723 - - - 61  21  100 % - -  2012/03/04 1722 - - - 63  20  100 % - -  03/04/2012 1721 - - - 63  20  100 % - -  03/04/12 1720 - - - 62  20  100 % - -  03-04-12 1719 - - - 62  19  100 % - -  Mar 04, 2012 1718 - - - 63  22  100 % - -  Mar 04, 2012 1717 - - - 63  25  100 % - -  March 04, 2012 1716 - - - 64  22  100 % - -  03/04/12 1715 - - - 66  26  100 % - -  March 04, 2012 1714 - - - 66  20  100 % - -  03/04/12 1713 - - - 65  18  97 % - -  03-04-2012 1712 - - - 65  20  99 % - -  2012-03-04 1711 - - - 64  18  100 % - -  03-04-2012 1710 128/73 mmHg - - 66  15  99 % - -  2012-03-04 1709 128/103 mmHg - - 66  22  99 % - -  03-04-2012 1708 - - - 68  26  99 % - -  2012/03/04 1707 - - - 74  - 99 % - -  Mar 04, 2012 1706 - - - 81  - 99 % - -  03-04-12 1705 - - - 84  - 100 % - -  Mar 04, 2012 1704 - - - 83  - 100 % - -  03/04/12 1703 - - - 78  - 100 % - -  2012-03-04 1702 - - - 79  - 100 % - -  03/04/2012 1655 128/73 mmHg 97.6 F (36.4 C) - 66  - 100 % - -     Recent laboratory studies:  Basename 03/02/12 0415 03/04/2012 1157  WBC 9.3 8.6  HGB 10.5* 11.4*  HCT 33.3* 36.1    PLT 258 293  NA 139 141  K 3.7 4.9  CL 103 105  CO2 27 29  BUN 13 17  CREATININE 0.59 0.59  GLUCOSE 90 95  INR -- --  CALCIUM 9.0 --     Discharge Medications:     Medication List     As of 03/02/2012  1:10 PM    STOP taking these medications         traMADol 50 MG tablet   Commonly known as: ULTRAM      TAKE these medications         aspirin EC 81 MG tablet   Take 81 mg by mouth daily.      furosemide 20 MG tablet   Commonly known as: LASIX   Take 20 mg by mouth daily.      furosemide 20 MG tablet   Commonly known as: LASIX   Take 1 tablet (20 mg total) by mouth daily.      IRON PO   Take 55 mg by mouth daily with breakfast. Per pt report      lisinopril 20 MG tablet   Commonly known as: PRINIVIL,ZESTRIL   Take 20 mg by mouth daily.      LORazepam 0.5 MG tablet   Commonly known as: ATIVAN   Take 0.5 mg by mouth every 8 (eight) hours. For anxiety      oxyCODONE-acetaminophen 5-325 MG per tablet   Commonly known as: PERCOCET/ROXICET  1-2 tables every 4-6 hours prn pain      pravastatin 40 MG tablet   Commonly known as: PRAVACHOL   Take 40 mg by mouth 2 (two) times daily.      pravastatin 40 MG tablet   Commonly known as: PRAVACHOL   Take 1 tablet (40 mg total) by mouth 2 (two) times daily.        Diagnostic Studies: No results found.  Disposition: 01-Home or Self Care      Discharge Orders    Future Orders Please Complete By Expires   Diet - low sodium heart healthy      Call MD / Call 911      Comments:   If you experience chest pain or shortness of breath, CALL 911 and be transported to the hospital emergency room.  If you develope a fever above 101 F, pus (white drainage) or increased drainage or redness at the wound, or calf pain, call your surgeon's office.   Constipation Prevention      Comments:   Drink plenty of fluids.  Prune juice may be helpful.  You may use a stool softener, such as Colace (over the counter) 100 mg twice a  day.  Use MiraLax (over the counter) for constipation as needed.   Increase activity slowly as tolerated      Driving restrictions      Comments:   No driving until cleared by physician.   Lifting restrictions      Comments:   No lifting until cleared by physician.      Follow-up Information    Follow up with Mable Paris, MD. Schedule an appointment as soon as possible for a visit in 7 days.   Contact information:   [address] [place] [address] [phone]           Signed: Jiles Harold 03/02/2012, 1:10 PM

## 2012-03-02 NOTE — Progress Notes (Signed)
Pt provided with discharge instructions, follow up appointment info, and prescriptions. Vital signs are stable, with latest BP of 117/70. Per PA if BP is WNL she is free to be discharged. Pt is leaving with son to discharge home.

## 2012-03-03 ENCOUNTER — Encounter (HOSPITAL_COMMUNITY): Payer: Self-pay | Admitting: Orthopedic Surgery

## 2012-03-09 DIAGNOSIS — M75120 Complete rotator cuff tear or rupture of unspecified shoulder, not specified as traumatic: Secondary | ICD-10-CM | POA: Diagnosis not present

## 2012-03-23 DIAGNOSIS — M75120 Complete rotator cuff tear or rupture of unspecified shoulder, not specified as traumatic: Secondary | ICD-10-CM | POA: Diagnosis not present

## 2012-04-25 DIAGNOSIS — Z96619 Presence of unspecified artificial shoulder joint: Secondary | ICD-10-CM | POA: Diagnosis not present

## 2012-04-25 DIAGNOSIS — M75120 Complete rotator cuff tear or rupture of unspecified shoulder, not specified as traumatic: Secondary | ICD-10-CM | POA: Diagnosis not present

## 2012-04-29 DIAGNOSIS — M75120 Complete rotator cuff tear or rupture of unspecified shoulder, not specified as traumatic: Secondary | ICD-10-CM | POA: Diagnosis not present

## 2012-04-29 DIAGNOSIS — Z96619 Presence of unspecified artificial shoulder joint: Secondary | ICD-10-CM | POA: Diagnosis not present

## 2012-05-01 ENCOUNTER — Ambulatory Visit (INDEPENDENT_AMBULATORY_CARE_PROVIDER_SITE_OTHER): Payer: MEDICARE | Admitting: Emergency Medicine

## 2012-05-01 ENCOUNTER — Ambulatory Visit: Payer: MEDICARE

## 2012-05-01 VITALS — BP 117/77 | HR 76 | Temp 98.7°F | Resp 16 | Ht 60.0 in | Wt 241.0 lb

## 2012-05-01 DIAGNOSIS — F329 Major depressive disorder, single episode, unspecified: Secondary | ICD-10-CM

## 2012-05-01 DIAGNOSIS — E119 Type 2 diabetes mellitus without complications: Secondary | ICD-10-CM | POA: Diagnosis not present

## 2012-05-01 DIAGNOSIS — M549 Dorsalgia, unspecified: Secondary | ICD-10-CM

## 2012-05-01 DIAGNOSIS — R232 Flushing: Secondary | ICD-10-CM

## 2012-05-01 DIAGNOSIS — F419 Anxiety disorder, unspecified: Secondary | ICD-10-CM

## 2012-05-01 DIAGNOSIS — F32A Depression, unspecified: Secondary | ICD-10-CM

## 2012-05-01 LAB — POCT CBC
Granulocyte percent: 61.2 %G (ref 37–80)
Hemoglobin: 11.7 g/dL — AB (ref 12.2–16.2)
MCV: 94.6 fL (ref 80–97)
MID (cbc): 0.5 (ref 0–0.9)
Platelet Count, POC: 333 10*3/uL (ref 142–424)
RBC: 4.12 M/uL (ref 4.04–5.48)

## 2012-05-01 LAB — POCT GLYCOSYLATED HEMOGLOBIN (HGB A1C): Hemoglobin A1C: 5.8

## 2012-05-01 MED ORDER — LORAZEPAM 0.5 MG PO TABS: 0.5000 mg | ORAL_TABLET | Freq: Three times a day (TID) | ORAL | Status: DC

## 2012-05-01 MED ORDER — VENLAFAXINE HCL ER 37.5 MG PO CP24: ORAL_CAPSULE | ORAL | Status: DC

## 2012-05-01 NOTE — Progress Notes (Signed)
  Subjective:    Patient ID: [name], female    DOB: [birth year], 72 y.o.   MRN: [id number]  HPI Patient is currently experiencing menopause changes.  She is having hot flashes and tearful moments. Patient denies being depressed.  Patient recently had surgery on her left shoulder. Her therapy started this past Thursday. Patient also needs a flu vaccine.    Review of Systems     Objective:   Physical Exam patient is tearful. She is in no distress. Her neck is supple her chest is clear to the abdomen is protuberant without masses. There is tenderness across the lower lumbar spine  [facility] reading (PRIMARY) by  Dr. Cleta Alberts there is significant degenerative changes of the entire lumbar spine. There is a suspected grade 1 spondylolisthesis L4-5       Assessment & Plan:  She's never recover from the loss of her son in a motor vehicle accident. She is multiple family members to try and help her but she continues to struggle with depression. She is complaining of hot flashes. I think a low dose of Effexor may be helpful in her in that it may help with the depression and with the hot flashes. X-rays of her back show significant degenerative changes with a spondylolisthesis L4-L5

## 2012-05-02 ENCOUNTER — Telehealth: Payer: Self-pay | Admitting: Emergency Medicine

## 2012-05-02 ENCOUNTER — Telehealth: Payer: Self-pay

## 2012-05-02 NOTE — Telephone Encounter (Signed)
Please call Athira and tell her not to take the tramadol either

## 2012-05-02 NOTE — Telephone Encounter (Signed)
Patient states she thinks the tramadol may also be causing her to have hot flashes and sweats, Dr Luiz Blare gave her this for shoulder. She advised me the lip swelling has decreased she is not having trouble breathing. She will call if she needs anything else, Dr Cleta Alberts advised. Also she is advised the Ativan has been called in for her. I have documented these new allergies in her chart, she plans to follow up with you on Thursday morning. [name]

## 2012-05-02 NOTE — Telephone Encounter (Addendum)
Patient called 4 AM stating that after she took her Effexor at 7:00 she started noticed around midnight some swelling of her lip. She also was very anxious and unable to sleep had some chest discomfort associated with this. When she called the swelling had subsided. She's not having any difficulty breathing. She was told not to take the Effexor any more. She will continue on her Ativan alone. She is to return to clinic later in the week to discuss other options for treatment. Also tell her to hold lisinopril for now. Because this drug is also associated with lip swelling. When she returns to clinic later this week we will discuss her medications at that time. Please call her and advise her these suggestions. Give her my hours  towards the end of the week so I can discuss these issues with her.

## 2012-05-02 NOTE — Telephone Encounter (Signed)
Called patient, see other message. Amy

## 2012-05-02 NOTE — Telephone Encounter (Signed)
Pt states the tramadol may be causing her hot flashes, and wanted Dr. Cleta Alberts to know this is one of the side effects.   [phone]

## 2012-05-02 NOTE — Telephone Encounter (Signed)
She was advised to d/c the Tramadol, but she stated she wants to continue because it helps with her shoulder pain. I called back and advised her to discontinue this medication as well. She agrees, said she is feeling better, she is relaxing after taking the Ativan, and will let me know if she has anything else she needs. [name]

## 2012-05-10 DIAGNOSIS — Z96619 Presence of unspecified artificial shoulder joint: Secondary | ICD-10-CM | POA: Diagnosis not present

## 2012-05-10 DIAGNOSIS — M75120 Complete rotator cuff tear or rupture of unspecified shoulder, not specified as traumatic: Secondary | ICD-10-CM | POA: Diagnosis not present

## 2012-05-12 DIAGNOSIS — M75120 Complete rotator cuff tear or rupture of unspecified shoulder, not specified as traumatic: Secondary | ICD-10-CM | POA: Diagnosis not present

## 2012-05-12 DIAGNOSIS — Z96619 Presence of unspecified artificial shoulder joint: Secondary | ICD-10-CM | POA: Diagnosis not present

## 2012-05-17 DIAGNOSIS — Z96619 Presence of unspecified artificial shoulder joint: Secondary | ICD-10-CM | POA: Diagnosis not present

## 2012-05-17 DIAGNOSIS — M75120 Complete rotator cuff tear or rupture of unspecified shoulder, not specified as traumatic: Secondary | ICD-10-CM | POA: Diagnosis not present

## 2012-05-19 DIAGNOSIS — Z96619 Presence of unspecified artificial shoulder joint: Secondary | ICD-10-CM | POA: Diagnosis not present

## 2012-05-19 DIAGNOSIS — M75120 Complete rotator cuff tear or rupture of unspecified shoulder, not specified as traumatic: Secondary | ICD-10-CM | POA: Diagnosis not present

## 2012-05-24 DIAGNOSIS — M75120 Complete rotator cuff tear or rupture of unspecified shoulder, not specified as traumatic: Secondary | ICD-10-CM | POA: Diagnosis not present

## 2012-05-24 DIAGNOSIS — Z96619 Presence of unspecified artificial shoulder joint: Secondary | ICD-10-CM | POA: Diagnosis not present

## 2012-05-28 ENCOUNTER — Other Ambulatory Visit: Payer: Self-pay | Admitting: Physician Assistant

## 2012-05-28 NOTE — Telephone Encounter (Signed)
PATIENT CALLED REQUESTING IF DR.DAUB CAN REFILL HER LISINOPRIL BECAUSE SHE IS CURRENTLY OUT. CAN ANOTHER PROVIDER FILL THIS, IF POSSIBLE? SHE STATES HER PHARMACY HAS SENT AN ELECTRONIC REQUEST RECENTLY. FOR ANY QUESTIONS CALL: [phone] BEST NUMBER.

## 2012-05-31 DIAGNOSIS — M75120 Complete rotator cuff tear or rupture of unspecified shoulder, not specified as traumatic: Secondary | ICD-10-CM | POA: Diagnosis not present

## 2012-05-31 DIAGNOSIS — Z96619 Presence of unspecified artificial shoulder joint: Secondary | ICD-10-CM | POA: Diagnosis not present

## 2012-06-02 DIAGNOSIS — Z96619 Presence of unspecified artificial shoulder joint: Secondary | ICD-10-CM | POA: Diagnosis not present

## 2012-06-02 DIAGNOSIS — M75120 Complete rotator cuff tear or rupture of unspecified shoulder, not specified as traumatic: Secondary | ICD-10-CM | POA: Diagnosis not present

## 2012-06-06 DIAGNOSIS — M75120 Complete rotator cuff tear or rupture of unspecified shoulder, not specified as traumatic: Secondary | ICD-10-CM | POA: Diagnosis not present

## 2012-06-07 DIAGNOSIS — M75120 Complete rotator cuff tear or rupture of unspecified shoulder, not specified as traumatic: Secondary | ICD-10-CM | POA: Diagnosis not present

## 2012-06-07 DIAGNOSIS — Z96619 Presence of unspecified artificial shoulder joint: Secondary | ICD-10-CM | POA: Diagnosis not present

## 2012-06-09 ENCOUNTER — Telehealth: Payer: Self-pay

## 2012-06-09 DIAGNOSIS — Z96619 Presence of unspecified artificial shoulder joint: Secondary | ICD-10-CM | POA: Diagnosis not present

## 2012-06-09 DIAGNOSIS — M75120 Complete rotator cuff tear or rupture of unspecified shoulder, not specified as traumatic: Secondary | ICD-10-CM | POA: Diagnosis not present

## 2012-06-09 NOTE — Telephone Encounter (Signed)
5' is height wt.  241 from last ov. Printed.

## 2012-06-09 NOTE — Telephone Encounter (Signed)
[name] STATES SHE IS TRYING TO GET HER MOM MORE INSURANCE AND NEED TO HAVE HER WEIGHT AND HEIGHT TYPED OUT ON A LETTERHEAD FOR THE INSURANCE COMPANY. SHE IS ON HER HIPPA. PLEASE CALL [phone] WHEN READY

## 2012-06-14 ENCOUNTER — Other Ambulatory Visit: Payer: Self-pay | Admitting: Physician Assistant

## 2012-06-16 DIAGNOSIS — Z96619 Presence of unspecified artificial shoulder joint: Secondary | ICD-10-CM | POA: Diagnosis not present

## 2012-06-16 DIAGNOSIS — M75120 Complete rotator cuff tear or rupture of unspecified shoulder, not specified as traumatic: Secondary | ICD-10-CM | POA: Diagnosis not present

## 2012-06-21 DIAGNOSIS — Z96619 Presence of unspecified artificial shoulder joint: Secondary | ICD-10-CM | POA: Diagnosis not present

## 2012-06-21 DIAGNOSIS — M75120 Complete rotator cuff tear or rupture of unspecified shoulder, not specified as traumatic: Secondary | ICD-10-CM | POA: Diagnosis not present

## 2012-06-23 DIAGNOSIS — M75120 Complete rotator cuff tear or rupture of unspecified shoulder, not specified as traumatic: Secondary | ICD-10-CM | POA: Diagnosis not present

## 2012-06-23 DIAGNOSIS — Z96619 Presence of unspecified artificial shoulder joint: Secondary | ICD-10-CM | POA: Diagnosis not present

## 2012-06-30 DIAGNOSIS — M75120 Complete rotator cuff tear or rupture of unspecified shoulder, not specified as traumatic: Secondary | ICD-10-CM | POA: Diagnosis not present

## 2012-06-30 DIAGNOSIS — Z96619 Presence of unspecified artificial shoulder joint: Secondary | ICD-10-CM | POA: Diagnosis not present

## 2012-07-07 ENCOUNTER — Observation Stay (HOSPITAL_COMMUNITY)
Admission: EM | Admit: 2012-07-07 | Discharge: 2012-07-08 | Disposition: A | Discharge status: Home / Self Care | Payer: MEDICARE | Attending: Family Medicine | Admitting: Family Medicine

## 2012-07-07 ENCOUNTER — Encounter (HOSPITAL_COMMUNITY): Payer: Self-pay | Admitting: Emergency Medicine

## 2012-07-07 ENCOUNTER — Ambulatory Visit (INDEPENDENT_AMBULATORY_CARE_PROVIDER_SITE_OTHER): Payer: MEDICARE | Admitting: Emergency Medicine

## 2012-07-07 VITALS — BP 112/82 | HR 84 | Temp 97.9°F | Resp 16 | Ht 60.0 in | Wt 253.0 lb

## 2012-07-07 DIAGNOSIS — D649 Anemia, unspecified: Secondary | ICD-10-CM | POA: Insufficient documentation

## 2012-07-07 DIAGNOSIS — K5732 Diverticulitis of large intestine without perforation or abscess without bleeding: Secondary | ICD-10-CM | POA: Diagnosis not present

## 2012-07-07 DIAGNOSIS — K625 Hemorrhage of anus and rectum: Principal | ICD-10-CM | POA: Insufficient documentation

## 2012-07-07 DIAGNOSIS — K5792 Diverticulitis of intestine, part unspecified, without perforation or abscess without bleeding: Secondary | ICD-10-CM

## 2012-07-07 DIAGNOSIS — F3289 Other specified depressive episodes: Secondary | ICD-10-CM | POA: Insufficient documentation

## 2012-07-07 DIAGNOSIS — I10 Essential (primary) hypertension: Secondary | ICD-10-CM

## 2012-07-07 DIAGNOSIS — F329 Major depressive disorder, single episode, unspecified: Secondary | ICD-10-CM | POA: Diagnosis not present

## 2012-07-07 DIAGNOSIS — F411 Generalized anxiety disorder: Secondary | ICD-10-CM | POA: Diagnosis not present

## 2012-07-07 DIAGNOSIS — K922 Gastrointestinal hemorrhage, unspecified: Secondary | ICD-10-CM

## 2012-07-07 DIAGNOSIS — Z96659 Presence of unspecified artificial knee joint: Secondary | ICD-10-CM | POA: Insufficient documentation

## 2012-07-07 DIAGNOSIS — N816 Rectocele: Secondary | ICD-10-CM

## 2012-07-07 DIAGNOSIS — E785 Hyperlipidemia, unspecified: Secondary | ICD-10-CM

## 2012-07-07 DIAGNOSIS — K219 Gastro-esophageal reflux disease without esophagitis: Secondary | ICD-10-CM

## 2012-07-07 LAB — IRON AND TIBC
Iron: 35 ug/dL — ABNORMAL LOW (ref 42–135)
Saturation Ratios: 12 % — ABNORMAL LOW (ref 20–55)
TIBC: 303 ug/dL (ref 250–470)
UIBC: 268 ug/dL (ref 125–400)

## 2012-07-07 LAB — PROTIME-INR
INR: 1.04 (ref 0.00–1.49)
Prothrombin Time: 13.5 seconds (ref 11.6–15.2)

## 2012-07-07 LAB — COMPREHENSIVE METABOLIC PANEL
Albumin: 4 g/dL (ref 3.5–5.2)
BUN: 16 mg/dL (ref 6–23)
Calcium: 10.2 mg/dL (ref 8.4–10.5)
Chloride: 104 mEq/L (ref 96–112)
Glucose, Bld: 105 mg/dL — ABNORMAL HIGH (ref 70–99)
Potassium: 4.3 mEq/L (ref 3.5–5.3)

## 2012-07-07 LAB — POCT CBC
Hemoglobin: 11.4 g/dL — AB (ref 12.2–16.2)
Lymph, poc: 2.9 (ref 0.6–3.4)
MCH, POC: 28.2 pg (ref 27–31.2)
MCV: 93.7 fL (ref 80–97)
MID (cbc): 0.7 (ref 0–0.9)
Platelet Count, POC: 330 10*3/uL (ref 142–424)
RBC: 4.04 M/uL (ref 4.04–5.48)
WBC: 8.8 10*3/uL (ref 4.6–10.2)

## 2012-07-07 LAB — CBC WITH DIFFERENTIAL/PLATELET
Eosinophils Absolute: 0.2 10*3/uL (ref 0.0–0.7)
Lymphocytes Relative: 25 % (ref 12–46)
Lymphs Abs: 2 10*3/uL (ref 0.7–4.0)
Neutrophils Relative %: 66 % (ref 43–77)
Platelets: 289 10*3/uL (ref 150–400)
RBC: 3.81 MIL/uL — ABNORMAL LOW (ref 3.87–5.11)
WBC: 7.7 10*3/uL (ref 4.0–10.5)

## 2012-07-07 LAB — CBC
HCT: 34.1 % — ABNORMAL LOW (ref 36.0–46.0)
Hemoglobin: 11.3 g/dL — ABNORMAL LOW (ref 12.0–15.0)
WBC: 8.9 10*3/uL (ref 4.0–10.5)

## 2012-07-07 LAB — IFOBT (OCCULT BLOOD): IFOBT: POSITIVE

## 2012-07-07 LAB — BASIC METABOLIC PANEL
Chloride: 106 mEq/L (ref 96–112)
Creatinine, Ser: 0.55 mg/dL (ref 0.50–1.10)
GFR calc Af Amer: >90 mL/min (ref >90)

## 2012-07-07 MED ORDER — DOCUSATE SODIUM 100 MG PO CAPS
100.0000 mg | ORAL_CAPSULE | Freq: Two times a day (BID) | ORAL | Status: DC
  Administered 2012-07-07 – 2012-07-08 (×2): 100 mg via ORAL
  Filled 2012-07-07 (×2): qty 1

## 2012-07-07 MED ORDER — ACETAMINOPHEN 325 MG PO TABS
650.0000 mg | ORAL_TABLET | Freq: Four times a day (QID) | ORAL | Status: DC | PRN
  Administered 2012-07-08: 650 mg via ORAL
  Filled 2012-07-07: qty 2

## 2012-07-07 MED ORDER — LORAZEPAM 0.5 MG PO TABS: 0.5000 mg | ORAL_TABLET | Freq: Three times a day (TID) | ORAL | Status: DC

## 2012-07-07 MED ORDER — ONDANSETRON HCL 4 MG PO TABS: 4.0000 mg | ORAL_TABLET | Freq: Four times a day (QID) | ORAL | Status: DC | PRN

## 2012-07-07 MED ORDER — SIMVASTATIN 40 MG PO TABS
40.0000 mg | ORAL_TABLET | Freq: Every day | ORAL | Status: DC
  Administered 2012-07-07: 40 mg via ORAL
  Filled 2012-07-07 (×3): qty 1

## 2012-07-07 MED ORDER — LORAZEPAM 0.5 MG PO TABS
0.5000 mg | ORAL_TABLET | Freq: Three times a day (TID) | ORAL | Status: DC | PRN
  Administered 2012-07-08: 0.5 mg via ORAL
  Filled 2012-07-07: qty 1

## 2012-07-07 MED ORDER — SODIUM CHLORIDE 0.9 % IV SOLN
INTRAVENOUS | Status: DC
  Administered 2012-07-07: 20 mL/h via INTRAVENOUS

## 2012-07-07 MED ORDER — TRAMADOL HCL 50 MG PO TABS: 50.0000 mg | ORAL_TABLET | Freq: Four times a day (QID) | ORAL | Status: DC | PRN

## 2012-07-07 MED ORDER — SENNA 8.6 MG PO TABS
1.0000 | ORAL_TABLET | Freq: Two times a day (BID) | ORAL | Status: DC
  Administered 2012-07-07 – 2012-07-08 (×2): 8.6 mg via ORAL
  Filled 2012-07-07 (×4): qty 1

## 2012-07-07 MED ORDER — ONDANSETRON HCL 4 MG/2ML IJ SOLN: 4.0000 mg | Freq: Four times a day (QID) | INTRAMUSCULAR | Status: DC | PRN

## 2012-07-07 MED ORDER — ACETAMINOPHEN 650 MG RE SUPP: 650.0000 mg | Freq: Four times a day (QID) | RECTAL | Status: DC | PRN

## 2012-07-07 MED ORDER — PANTOPRAZOLE SODIUM 40 MG PO TBEC
40.0000 mg | DELAYED_RELEASE_TABLET | Freq: Every day | ORAL | Status: DC
  Administered 2012-07-07 – 2012-07-08 (×2): 40 mg via ORAL
  Filled 2012-07-07 (×2): qty 1

## 2012-07-07 NOTE — ED Provider Notes (Signed)
History     CSN: [id number]  Arrival date & time 07/07/12  1153   First MD Initiated Contact with Patient 07/07/12 1217      Chief Complaint  Patient presents with  . Rectal Bleeding    H[facility] c/o rectal bleeding. Patient states she noticed a moderate amount of bleeding from her rectum and dark hard stools. Patient stable at this time, a&ox4. Scant amount of bleeding coming from rectum at this time. Will continue to monitor the patient.  Past Medical History  Diagnosis Date  . Anemia     on iron  . Hypertension   . Hyperlipidemia     on med  . Anxiety   . Arthritis     shoulders, knees, hands, hips  . Depression   . Pneumonia 2003  . Hyperlipemia   . Constipation   . GERD (gastroesophageal reflux disease)     TAKES TUMS    Past Surgical History  Procedure Laterality Date  . [place] teeth ext    . Svd      x 7  . Tubal ligation    . Knee surgery  11/2001    left knee  . Replacement total knee  10/2009    left knee  . Carpel tunnel release      left hand  . Colonscopy    . Joint replacement  10/2009    left knee  . Total shoulder replacement  01/19/2012    left shoulder  . Total shoulder arthroplasty  01/18/2012    Procedure: TOTAL SHOULDER ARTHROPLASTY;  Surgeon: Mable Paris, MD;  Location[facility] OR;  Service: Orthopedics;  Laterality: Left;  . Shoulder open rotator cuff repair  03/01/2012    Procedure: ROTATOR CUFF REPAIR SHOULDER OPEN;  Surgeon: Mable Paris, MD;  Location: [facility] OR;  Service: Orthopedics;  Laterality: Left;  SUBSCAPULARIS REPAIR    No family history on file.  History  Substance Use Topics  . Smoking status: Former Smoker -- 1.00 packs/day for 25 years    Types: Cigarettes    Quit date: 03/07/1984  . Smokeless tobacco: Former Neurosurgeon  . Alcohol Use: No    OB History   Grav Para Term Preterm Abortions TAB SAB Ect Mult Living                  Review of Systems  Unable to perform  ROS Respiratory: Negative for cough.   Hematological: Does not bruise/bleed easily.  All other systems reviewed and are negative.    Allergies  Effexor; Lisinopril; and Sulfa drugs cross reactors  Home Medications   Current Outpatient Rx  Name  Route  Sig  Dispense  Refill  . aspirin EC 81 MG tablet   Oral   Take 81 mg by mouth daily.         . furosemide (LASIX) 20 MG tablet   Oral   Take 20 mg by mouth daily.         Marland Kitchen ibuprofen (ADVIL,MOTRIN) 200 MG tablet   Oral   Take 200 mg by mouth every 8 (eight) hours as needed for pain.         . IRON PO   Oral   Take 55 mg by mouth daily with breakfast. Per pt report         . lisinopril (PRINIVIL,ZESTRIL) 20 MG tablet   Oral   Take 20 mg by mouth daily.         Marland Kitchen  LORazepam (ATIVAN) 0.5 MG tablet   Oral   Take 1 tablet (0.5 mg total) by mouth every 8 (eight) hours. For anxiety   30 tablet   5   . pravastatin (PRAVACHOL) 40 MG tablet   Oral   Take 40 mg by mouth 2 (two) times daily.         . traMADol (ULTRAM) 50 MG tablet   Oral   Take 50 mg by mouth every 6 (six) hours as needed.           BP 108/66  Pulse 65  Temp(Src) 97.9 F (36.6 C) (Oral)  Resp 18  SpO2 99%  Physical Exam  Nursing note and vitals reviewed. Constitutional: She is oriented to person, place, and time. She appears well-developed and well-nourished. No distress.  HENT:  Head: Normocephalic and atraumatic.  Eyes: Pupils are equal, round, and reactive to light.  Neck: Normal range of motion.  Cardiovascular: Normal rate and intact distal pulses.   Pulmonary/Chest: No respiratory distress.  Abdominal: Normal appearance. She exhibits no distension. There is no tenderness. There is no rebound.  Musculoskeletal: Normal range of motion.  Neurological: She is alert and oriented to person, place, and time. No cranial nerve deficit.  Skin: Skin is warm and dry. No rash noted.  Psychiatric: She has a normal mood and affect. Her  behavior is normal.    ED Course  Procedures (including critical care time) Family practice contacted for possible admission.  GI called and wanted patient admitted for further workup. Labs Reviewed  CBC WITH DIFFERENTIAL - Abnormal; Notable for the following:    RBC 3.81 (*)    Hemoglobin 11.1 (*)    HCT 34.2 (*)    All other components within normal limits  PROTIME-INR  BASIC METABOLIC PANEL   No results found.   1. Lower GI bleed       MDM          Nelia Shi, MD 07/08/12 1131

## 2012-07-07 NOTE — Consult Note (Addendum)
Referring Provider: Dr. Radford Pax Primary Care Physician:  Lucilla Edin, MD Primary Gastroenterologist:  Dr. Bosie Clos  Reason for Consultation:  Rectal bleeding  HPI: [name] is a 72 y.o. female who has a history of anemia on iron pills and a history of sigmoid diverticulosis and medium-sized internal hemorrhoids seen on a colonoscopy in 2007 that was done along with an EGD then to work up her anemia. EGD showed mild gastritis and a hiatal hernia. She has been having constipation for the past month, where she strains daily to move her bowels and intermittent burning in her lower abdomen bilaterally pointing to her pelvic area. Has chronic black stools on iron pills. This morning at "9:20 AM" she had an episode of dark red blood with a small stool. Denies any tarry stools. Has not had any further episodes besides that one time. Denies any associated abdominal pain and no burning in her rectum or rectal pain. Denies any burning sensation in her lower abdomen like she has had in the past. No N/V. Was on Tramadol until 3-4 months ago following shoulder surgery. Takes Ibuprofen about 2 times/week prn for arthritis. Denies dizziness or lightheadedness today. FOBT positive. Feels ok right now.  Past Medical History  Diagnosis Date  . Anemia     on iron  . Hypertension   . Hyperlipidemia     on med  . Anxiety   . Arthritis     shoulders, knees, hands, hips  . Depression   . Pneumonia 2003  . Hyperlipemia   . Constipation   . GERD (gastroesophageal reflux disease)     TAKES TUMS    Past Surgical History  Procedure Laterality Date  . Wisdon teeth ext    . Svd      x 7  . Tubal ligation    . Knee surgery  11/2001    left knee  . Replacement total knee  10/2009    left knee  . Carpel tunnel release      left hand  . Colonscopy    . Joint replacement  10/2009    left knee  . Total shoulder replacement  01/19/2012    left shoulder  . Total shoulder arthroplasty  01/18/2012   Procedure: TOTAL SHOULDER ARTHROPLASTY;  Surgeon: Mable Paris, MD;  Location: [facility] OR;  Service: Orthopedics;  Laterality: Left;  . Shoulder open rotator cuff repair  03/01/2012    Procedure: ROTATOR CUFF REPAIR SHOULDER OPEN;  Surgeon: Mable Paris, MD;  Location: [facility] OR;  Service: Orthopedics;  Laterality: Left;  SUBSCAPULARIS REPAIR    Prior to Admission medications   Medication Sig Start Date End Date Taking? Authorizing Provider  aspirin EC 81 MG tablet Take 81 mg by mouth daily.   Yes Historical Provider, MD  furosemide (LASIX) 20 MG tablet Take 20 mg by mouth daily. 12/15/11  Yes Ryan M Dunn, PA-C  ibuprofen (ADVIL,MOTRIN) 200 MG tablet Take 200 mg by mouth every 8 (eight) hours as needed for pain.   Yes Historical Provider, MD  IRON PO Take 55 mg by mouth daily with breakfast. Per pt report   Yes Historical Provider, MD  lisinopril (PRINIVIL,ZESTRIL) 20 MG tablet Take 20 mg by mouth daily.   Yes Historical Provider, MD  LORazepam (ATIVAN) 0.5 MG tablet Take 1 tablet (0.5 mg total) by mouth every 8 (eight) hours. For anxiety 05/01/12  Yes Collene Gobble, MD  pravastatin (PRAVACHOL) 40 MG tablet Take 40 mg by mouth 2 (two)  times daily. 12/15/11  Yes Ryan M Dunn, PA-C  traMADol (ULTRAM) 50 MG tablet Take 50 mg by mouth every 6 (six) hours as needed.   Yes Historical Provider, MD    Scheduled Meds: Continuous Infusions: PRN Meds:.    Allergies as of 07/07/2012 - Review Complete 07/07/2012  Allergen Reaction Noted  . Effexor (venlafaxine hcl)  05/02/2012  . Lisinopril  05/02/2012  . Sulfa drugs cross reactors Itching 03/31/2011    No family history on file.  History   Social History  . Marital Status: Widowed    Spouse Name: N/A    Number of Children: N/A  . Years of Education: N/A   Occupational History  . Not on file.   Social History Main Topics  . Smoking status: Former Smoker -- 1.00 packs/day for 25 years    Types: Cigarettes    Quit date:  03/07/1984  . Smokeless tobacco: Former Neurosurgeon  . Alcohol Use: No  . Drug Use: No  . Sexually Active: No   Other Topics Concern  . Not on file   Social History Narrative  . No narrative on file    Review of Systems: All negative except as stated above in HPI.  Physical Exam: Vital signs: Filed Vitals:   07/07/12 1515  BP: 108/66  Pulse: 65  Temp: 97.9  Resp: 18     General:   Alert,  Morbidly obese, pleasant and cooperative in NAD HEENT: anicteric, oropharynx clear Neck: supple, nontender Lungs:  Clear throughout to auscultation.   No wheezes, crackles, or rhonchi. No acute distress. Heart:  Regular rate and rhythm; no murmurs, clicks, rubs,  or gallops. Abdomen: soft, nontender, nondistended, +BS, obese  Rectal:  Deferred Ext: no edema  GI:  Lab Results:  Recent Labs  07/07/12 1107 07/07/12 1255  WBC 8.8 7.7  HGB 11.4* 11.1*  HCT 37.9 34.2*  PLT  --  289   BMET  Recent Labs  07/07/12 1255  NA 144  K 4.5  CL 106  CO2 32  GLUCOSE 97  BUN 16  CREATININE 0.55  CALCIUM 10.3   LFT No results found for this basename: PROT, ALBUMIN, AST, ALT, ALKPHOS, BILITOT, BILIDIR, IBILI,  in the last 72 hours PT/INR  Recent Labs  07/07/12 1255  LABPROT 13.5  INR 1.04     Studies/Results: No results found.  Impression/Plan: 72 yo with one episode of rectal bleeding with normal hemodynamics, normal BUN, and Hgb 11.1 who has a history of anemia, hemorrhoids, and sigmoid diverticulosis. She also has been having constipation with straining for the past 4 weeks. I think her bleeding is likely from a rectal outlet source such as internal hemorrhoids although a diverticular source is also high in the differential. Presentation NOT consistent with an upper GI source such as an ulcer. I do NOT feel an EGD is needed at this time based on her history. Will also hold off on prepping for a colonoscopy at this time unless bleeding persists. If bleeding resolves, then will need  an outpt colonoscopy this year instead of as previously planned in 2017. If bleeding returns, then will consider doing colonoscopy as an inpt. Otherwise follow H/Hs, bowel rest today except clears, IVFs. Will follow. Needs to avoid NSAIDs at home.    LOS: 0 days   Ariday Brinker C.  07/07/2012, 3:56 PM

## 2012-07-07 NOTE — ED Notes (Signed)
[facility] c/o rectal bleeding.  Patient states she noticed a moderate amount of bleeding from her rectum and dark hard stools.  Patient stable at this time, a&ox4.  Scant amount of bleeding coming from rectum at this time.  Will continue to monitor the patient.

## 2012-07-07 NOTE — Progress Notes (Signed)
  Subjective:    Patient ID: [name], female    DOB: November 16, 1940, 72 y.o.   MRN: [id number]  HPI patient states she's been bothered somewhat with constipation over the past few weeks she's been having balls of her bowel movements and then this morning when she had a bowel movement she noted the commode was full of blood. Patient last had a colonoscopy April 2007 by Dr. Bosie Clos with diverticulosis    Review of Systems     Objective:   Physical Exam patient is alert and cooperative. Her chest is clear. Heart regular rate no murmurs.  Abdomen is obese liver and spleen not enlarged no tenderness. There is a blackish stool around the anal area. This was cleaned and a rectal exam was performed. This returned as a current jelly stool with an odor of old blood.  Results for orders placed in visit on 07/07/12  POCT CBC      Result Value Range   WBC 8.8  4.6 - 10.2 K/uL   Lymph, poc 2.9  0.6 - 3.4   POC LYMPH PERCENT 33.3  10 - 50 %L   MID (cbc) 0.7  0 - 0.9   POC MID % 7.5  0 - 12 %M   POC Granulocyte 5.2  2 - 6.9   Granulocyte percent 59.2  37 - 80 %G   RBC 4.04  4.04 - 5.48 M/uL   Hemoglobin 11.4 (*) 12.2 - 16.2 g/dL   HCT, POC 11.9  14.7 - 47.9 %   MCV 93.7  80 - 97 fL   MCH, POC 28.2  27 - 31.2 pg   MCHC 30.1 (*) 31.8 - 35.4 g/dL   RDW, POC 82.9     Platelet Count, POC 330  142 - 424 K/uL   MPV 8.3  0 - 99.8 fL  GLUCOSE, POCT (MANUAL RESULT ENTRY)      Result Value Range   POC Glucose 111 (*) 70 - 99 mg/dl  IFOBT (OCCULT BLOOD)      Result Value Range   IFOBT Positive          Assessment & Plan:  Patient presents with profuse rectal bleeding. She had a colonoscopy in 2007. this bleeding is most likely secondary to diverticulosis. Dr. Bosie Clos was called and he is aware of this patient.

## 2012-07-07 NOTE — H&P (Signed)
Family Medicine Teaching [facility] Admission History and Physical Service Pager: [phone]  Patient name: [name] Medical record number: [id number] Date of birth: 1941/04/12 Age: 71 y.o. Gender: female  Primary Care Provider: Lucilla Edin, MD of [facility]  Chief Complaint: Rectal bleeding.   Assessment and Plan: [name] is a 72 y.o. year old female presenting with PMH of Diverticular disease, constipation, HTN, HLD, and GERD who presents with rectal bleeding.  # Rectal bleeding  - Will admit for observation, Med-Surg, Attending Dr. Deirdre Priest - Hb stable at this time and bleeding has subsided.  Will check Q8 CBC x 3. Holding Aspirin and pharmacologic DVT prophylaxis. - Will monitor closely during admission.  Likely D/C home in the am with close follow up with PCP and GI outpatient - Appreciate GI recommendations in house as well -PPI for now although likely not upper GI bleed  # HTN - Holding home lisinopril [patient has known allergy (lip swelling)] - Will monitor BP closely during admission and start antihypertensives as needed.  # Hyperlipidemia - Simvastatin substituted for home Pravachol.  # GERD - Starting patient on daily Protonix  # Anemia - Patient reports history of anemia for which she takes Iron.  - No anemia panel available in Epic. - Will obtain anemia panel during admission.  FEN/GI: KVO fluids. Regular Diet Prophylaxis: SCD's, PPI Disposition: Pending resolution of bleeding and stabilization of hemoglobin Code Status: Full code.  History of Present Illness:  [name] is a 72 y.o. year old female with a PMH of Diverticular disease, constipation, HTN, HLD, and GERD who presents with rectal bleeding which began this am.   Patient reports that at 9:30 am she had a large bloody, dark red bowel movement.  No associated pain or discomfort.  She was concerned, so she went to her PCP, Dr. Cleta Alberts, to be evaluated.  In the clinic,  patient was found to have hemoccult positive stool, and given the severity of bleeding she was sent to the ED for evaluation/admission.  In the ED, patient was hemodynamically stable with Hb of 11.1.  Patient reports that she has had 2 additional bloody BM's since this am.  ROS: Denies chest pain, SOB, N/V, hematemesis, hemoptysis.  Reports dark stools (secondary to iron supplementation).  Reports frequent heartburn requiring daily tums.  Denies chronic NSAID use although does use ibuprofen 1-2 x per week.   Patient Active Problem List  Diagnosis  . Osteoarthritis of left shoulder  . Rotator cuff tear, left   Past Medical History: Past Medical History  Diagnosis Date  . Anemia     on iron  . Hypertension   . Hyperlipidemia     on med  . Anxiety   . Arthritis     shoulders, knees, hands, hips  . Depression   . Pneumonia 2003  . Hyperlipemia   . Constipation   . GERD (gastroesophageal reflux disease)     TAKES TUMS   Past Surgical History: Past Surgical History  Procedure Laterality Date  . Wisdon teeth ext    . Svd      x 7  . Tubal ligation    . Knee surgery  11/2001    left knee  . Replacement total knee  10/2009    left knee  . Carpel tunnel release      left hand  . Colonscopy    . Joint replacement  10/2009    left knee  . Total shoulder replacement  01/19/2012    left shoulder  . Total shoulder arthroplasty  01/18/2012    Procedure: TOTAL SHOULDER ARTHROPLASTY;  Surgeon: Mable Paris, MD;  Location: [facility] OR;  Service: Orthopedics;  Laterality: Left;  . Shoulder open rotator cuff repair  03/01/2012    Procedure: ROTATOR CUFF REPAIR SHOULDER OPEN;  Surgeon: Mable Paris, MD;  Location: [facility] OR;  Service: Orthopedics;  Laterality: Left;  SUBSCAPULARIS REPAIR   Social History: History  Substance Use Topics  . Smoking status: Former Smoker -- 1.00 packs/day for 25 years    Types: Cigarettes    Quit date: 03/07/1984  . Smokeless tobacco: Former  Neurosurgeon  . Alcohol Use: No   Family History: No family history on file.  Allergies: Allergies  Allergen Reactions  . Effexor (Venlafaxine Hcl)     Lip swelling patient advised to d/c  . Lisinopril     Lip swelling patient advised to d/c  . Sulfa Drugs Cross Reactors Itching   No current facility-administered medications on file prior to encounter.   Current Outpatient Prescriptions on File Prior to Encounter  Medication Sig Dispense Refill  . aspirin EC 81 MG tablet Take 81 mg by mouth daily.      . furosemide (LASIX) 20 MG tablet Take 20 mg by mouth daily.      . IRON PO Take 55 mg by mouth daily with breakfast. Per pt report      . lisinopril (PRINIVIL,ZESTRIL) 20 MG tablet Take 20 mg by mouth daily.      Marland Kitchen LORazepam (ATIVAN) 0.5 MG tablet Take 1 tablet (0.5 mg total) by mouth every 8 (eight) hours. For anxiety  30 tablet  5  . pravastatin (PRAVACHOL) 40 MG tablet Take 40 mg by mouth 2 (two) times daily.      . traMADol (ULTRAM) 50 MG tablet Take 50 mg by mouth every 6 (six) hours as needed.       Review Of Systems: Per HPI. Otherwise 12 point review of systems was performed and was unremarkable.  Physical Exam: BP 115/61  Pulse 70  Temp(Src) 97.9 F (36.6 C) (Oral)  Resp 16  SpO2 100% Exam: General: well appearing elderly lady in NAD. HEENT: NCAT.  Cardiovascular: RRR. 1/6 systolic murmur appreciated. Respiratory: CTAB. No rales, rhonchi, or wheezing. Abdomen: soft, nontender, nondistended. +BS. Extremities: warm, well perfused.  Trace - 1+ pitting edema. Skin: No rash. Neuro: No focal deficits. Rectal: External hemorrhoids noted.  Visible blood with examination.  No internal hemorrhoids felt with rectal exam. Anoscopy not performed (it was not available in the ED at time of evaluation).  Labs and Imaging: CBC BMET   Recent Labs Lab 07/07/12 1255  WBC 7.7  HGB 11.1*  HCT 34.2*  PLT 289    Recent Labs Lab 07/07/12 1255  NA 144  K 4.5  CL 106  CO2 32   BUN 16  CREATININE 0.55  GLUCOSE 97  CALCIUM 10.3     FOBT - positive.   Everlene Other, DO 07/07/2012, 2:39 PM  Family Medicine Upper Level Addendum:   I have seen and examined the patient independently, discussed with Dr. Adriana Simas, fully reviewed the H+P and agree with it's contents with the additions as noted in blue text. My independent exam is below.    Tana Conch, MD, PGY2 07/07/2012 9:41 PM

## 2012-07-07 NOTE — ED Notes (Addendum)
Per Main Lab - blood received roughly 10 minutes ago.  Being run now. Radford Pax MD aware

## 2012-07-07 NOTE — Progress Notes (Addendum)
Family Medicine Teaching Service Attending Note  I interviewed and examined patient [name] and reviewed their tests and x-rays.  I discussed with Dr. Adriana Simas and reviewed their note for today.  I agree with their assessment and plan.     Additionally  Rectal Bleeding No cardiovascular disease  No signs of coagulapathy No abdomen pain Hgb is little changed from Jan 2014 11.7 to 11.1 No bleeding since arrived in ER around noon Will observe overnight

## 2012-07-08 DIAGNOSIS — K625 Hemorrhage of anus and rectum: Secondary | ICD-10-CM | POA: Diagnosis not present

## 2012-07-08 DIAGNOSIS — K922 Gastrointestinal hemorrhage, unspecified: Secondary | ICD-10-CM | POA: Diagnosis not present

## 2012-07-08 LAB — CBC
HCT: 33.3 % — ABNORMAL LOW (ref 36.0–46.0)
Hemoglobin: 10.6 g/dL — ABNORMAL LOW (ref 12.0–15.0)
MCH: 29.3 pg (ref 26.0–34.0)
MCHC: 31.8 g/dL (ref 30.0–36.0)
MCV: 92 fL (ref 78.0–100.0)

## 2012-07-08 LAB — FOLATE: Folate: >20 ng/mL

## 2012-07-08 MED ORDER — FERROUS SULFATE 325 (65 FE) MG PO TABS
325.0000 mg | ORAL_TABLET | Freq: Two times a day (BID) | ORAL | Status: DC
  Administered 2012-07-08: 325 mg via ORAL
  Filled 2012-07-08 (×3): qty 1

## 2012-07-08 MED ORDER — DSS 100 MG PO CAPS: 100.0000 mg | ORAL_CAPSULE | Freq: Two times a day (BID) | ORAL | Status: DC | PRN

## 2012-07-08 NOTE — Progress Notes (Signed)
DC home with daughter, verbally understood DC instructions, no questions asked 

## 2012-07-08 NOTE — Progress Notes (Signed)
Patient ID: [name], female   DOB: [birth year], 72 y.o.   MRN: [id number]  Reports small amount of blood this morning with stool but a lot less than yesterday morning. Feels good. Sitting on side of bed. No complaints.  Agree with D/C home and will have her f/u with me in April to decide on timing of colonoscopy.

## 2012-07-08 NOTE — Discharge Summary (Signed)
Physician Discharge Summary  Patient ID: [name] MRN: [id number] DOB: Apr 28, 1940 Age: 72 y.o.  Admit date: 07/07/2012 Discharge date: 07/08/2012 Admitting Physician: Carney Living, MD  PCP: Lucilla Edin, MD  Consultants:GI Deboraha Sprang)      Discharge Diagnosis:  Principal Problem:   Rectal bleeding Active Problems:   Hypertension   GERD (gastroesophageal reflux disease)   Hyperlipidemia    Hospital Course [name] is a 72 y.o. female who has a history of anemia on iron pills and a history of sigmoid diverticulosis and medium-sized internal hemorrhoids seen on a colonoscopy in 2007 that was done along with an EGD then to work up her anemia who presented to the clinic with rectal bleeding.  1) Rectal Bleeding - Pt admitted to the hospital due to concern for worsening rectal bleeding.  Hgb on admission was 11.1 ( Baseline 11-12) and had repeat CBC q 8 hrs which remained stable between 10.6-11.4.  GI was consulted and they recommended outpatient colonoscopy with no further inpatient work up.    2) Normocytic Anemia - Iron panel ordered on admission showing Ferritin 59, Fe 35, TIBC 303, Folate >20, B12 397.  She was continued on her iron pills and a peripheral smear was ordered, pending at d/c.    3) GERD - Stable, continued Protonix  4) HLD - Stable, continued statin  5) HTN - Stable, continued home meds          Discharge PE   Filed Vitals:   07/08/12 0540  BP: 129/63  Pulse: 72  Temp: 98.2 F (36.8 C)  Resp: 18   Gen: NAD  HEENT: moist mucous membranes  CV: Regular rate and rhythm, no murmurs rubs or gallops  PULM: clear to auscultation bilaterally. No wheezes/rales/rhonchi  ABD: soft/nontender/nondistended/normal bowel sounds  EXT: No edema  Neuro: Alert and oriented x3    Procedures/Imaging:  No results found.  Labs  CBC  Recent Labs Lab 07/07/12 1255 07/07/12 1802 07/08/12 0615  WBC 7.7 8.9 7.2  HGB 11.1* 11.3* 10.6*  HCT 34.2* 34.1*  33.3*  PLT 289 276 261   BMET  Recent Labs Lab 07/07/12 1043 07/07/12 1255  NA 142 144  K 4.3 4.5  CL 104 106  CO2 27 32  BUN 16 16  CREATININE 0.65 0.55  CALCIUM 10.2 10.3  PROT 7.1  --   BILITOT 0.4  --   ALKPHOS 68  --   ALT 9  --   AST 23  --   GLUCOSE 105* 97   VITAMIN B12     Status: None   Collection Time    07/07/12  6:02 PM      Result Value Range   Vitamin B-12 397  211 - 911 pg/mL  FOLATE     Status: None   Collection Time    07/07/12  6:02 PM      Result Value Range   Folate >20.0     Comment: (NOTE)     Reference Ranges            Deficient:       0.4 - 3.3 ng/mL            Indeterminate:   3.4 - 5.4 ng/mL            Normal:              > 5.4 ng/mL  IRON AND TIBC     Status: Abnormal   Collection Time  07/07/12  6:02 PM      Result Value Range   Iron 35 (*) 42 - 135 ug/dL   TIBC 161  096 - 045 ug/dL   Saturation Ratios 12 (*) 20 - 55 %   UIBC 268  125 - 400 ug/dL  FERRITIN     Status: None   Collection Time    07/07/12  6:02 PM      Result Value Range   Ferritin 59  10 - 291 ng/mL  RETICULOCYTES     Status: Abnormal   Collection Time    07/07/12  6:02 PM      Result Value Range   Retic Ct Pct 1.4  0.4 - 3.1 %       Patient condition at time of discharge/disposition: stable  Disposition-home   Follow up issues: 1. Will need outpatient GI for repeat colonoscopy 2. F/U peripheral smear.  Repeat CBC and consider increasing Iron intake   Discharge follow up:    Follow-up Information   Follow up with DAUB, STEVE A, MD. Schedule an appointment as soon as possible for a visit in 1 week.   Contact information:   [address] [place] [address] [phone]         Discharge Orders   Future Orders Complete By Expires     Activity as tolerated - No restrictions  As directed     Call MD for:  difficulty breathing, headache or visual disturbances  As directed     Call MD for:  extreme fatigue  As directed     Diet - low  sodium heart healthy  As directed          Discharge Instructions: Please refer to Patient Instructions section of EMR for full details.  Patient was counseled important signs and symptoms that should prompt return to medical care, changes in medications, dietary instructions, activity restrictions, and follow up appointments.  Significant instructions noted below:    Discharge Medications   Medication List    TAKE these medications       aspirin EC 81 MG tablet  Take 81 mg by mouth daily.     DSS 100 MG Caps  Take 100 mg by mouth 2 (two) times daily as needed for constipation.     furosemide 20 MG tablet  Commonly known as:  LASIX  Take 20 mg by mouth daily.     ibuprofen 200 MG tablet  Commonly known as:  ADVIL,MOTRIN  Take 200 mg by mouth every 8 (eight) hours as needed for pain.     IRON PO  Take 55 mg by mouth daily with breakfast. Per pt report     lisinopril 20 MG tablet  Commonly known as:  PRINIVIL,ZESTRIL  Take 20 mg by mouth daily.     LORazepam 0.5 MG tablet  Commonly known as:  ATIVAN  Take 1 tablet (0.5 mg total) by mouth every 8 (eight) hours. For anxiety     pravastatin 40 MG tablet  Commonly known as:  PRAVACHOL  Take 40 mg by mouth 2 (two) times daily.     traMADol 50 MG tablet  Commonly known as:  ULTRAM  Take 50 mg by mouth every 6 (six) hours as needed.            Gildardo Cranker, DO of [facility] 07/08/2012 9:47 AM

## 2012-07-08 NOTE — Progress Notes (Signed)
PGY-1 Daily Progress Note Family Medicine Teaching Service [name] Mont R. Kelseigh Diver, DO Service Pager: [phone]   Subjective: Pt with no dizziness, no lightheaded, no diplopia, blurred vision, syncope, palpitations and no rectal bleeding since admission.   Objective:  VITALS Temp:  [97.9 F (36.6 C)-98.6 F (37 C)] 98.2 F (36.8 C) (03/28 0540) Pulse Rate:  [65-84] 72 (03/28 0540) Resp:  [15-18] 18 (03/28 0540) BP: (106-150)/(52-96) 129/63 mmHg (03/28 0540) SpO2:  [95 %-100 %] 99 % (03/28 0540) Weight:  [252 lb (114.306 kg)-253 lb (114.76 kg)] 252 lb (114.306 kg) (03/27 1820)  In/Out  Intake/Output Summary (Last 24 hours) at 07/08/12 0827 Last data filed at 07/08/12 0600  Gross per 24 hour  Intake    160 ml  Output    400 ml  Net   -240 ml    Physical Exam: Gen:  NAD HEENT: moist mucous membranes CV: Regular rate and rhythm, no murmurs rubs or gallops PULM: clear to auscultation bilaterally. No wheezes/rales/rhonchi ABD: soft/nontender/nondistended/normal bowel sounds EXT: No edema Neuro: Alert and oriented x3  MEDS Scheduled Meds: . docusate sodium  100 mg Oral BID  . pantoprazole  40 mg Oral Daily  . senna  1 tablet Oral BID  . simvastatin  40 mg Oral q1800   Continuous Infusions: . sodium chloride 20 mL/hr (07/07/12 1841)   PRN Meds:.acetaminophen, acetaminophen, LORazepam, ondansetron (ZOFRAN) IV, ondansetron, traMADol  Labs and imaging:   CBC  Recent Labs Lab 07/07/12 1255 07/07/12 1802 07/08/12 0615  WBC 7.7 8.9 7.2  HGB 11.1* 11.3* 10.6*  HCT 34.2* 34.1* 33.3*  PLT 289 276 261   BMET/CMET  Recent Labs Lab 07/07/12 1043 07/07/12 1255  NA 142 144  K 4.3 4.5  CL 104 106  CO2 27 32  BUN 16 16  CREATININE 0.65 0.55  CALCIUM 10.2 10.3  PROT 7.1  --   BILITOT 0.4  --   ALKPHOS 68  --   ALT 9  --   AST 23  --   GLUCOSE 105* 97   Results for orders placed during the hospital encounter of 07/07/12 (from the past 24 hour(s))  CBC WITH  DIFFERENTIAL     Status: Abnormal   Collection Time    07/07/12 12:55 PM      Result Value Range   WBC 7.7  4.0 - 10.5 K/uL   RBC 3.81 (*) 3.87 - 5.11 MIL/uL   Hemoglobin 11.1 (*) 12.0 - 15.0 g/dL   HCT 14.7 (*) 82.9 - 56.2 %   MCV 89.8  78.0 - 100.0 fL   MCH 29.1  26.0 - 34.0 pg   MCHC 32.5  30.0 - 36.0 g/dL   RDW 13.0  86.5 - 78.4 %   Platelets 289  150 - 400 K/uL   Neutrophils Relative 66  43 - 77 %   Neutro Abs 5.1  1.7 - 7.7 K/uL   Lymphocytes Relative 25  12 - 46 %   Lymphs Abs 2.0  0.7 - 4.0 K/uL   Monocytes Relative 7  3 - 12 %   Monocytes Absolute 0.5  0.1 - 1.0 K/uL   Eosinophils Relative 2  0 - 5 %   Eosinophils Absolute 0.2  0.0 - 0.7 K/uL   Basophils Relative 0  0 - 1 %   Basophils Absolute 0.0  0.0 - 0.1 K/uL  PROTIME-INR     Status: None   Collection Time    07/07/12 12:55 PM  Result Value Range   Prothrombin Time 13.5  11.6 - 15.2 seconds   INR 1.04  0.00 - 1.49  BASIC METABOLIC PANEL     Status: None   Collection Time    07/07/12 12:55 PM      Result Value Range   Sodium 144  135 - 145 mEq/L   Potassium 4.5  3.5 - 5.1 mEq/L   Chloride 106  96 - 112 mEq/L   CO2 32  19 - 32 mEq/L   Glucose, Bld 97  70 - 99 mg/dL   BUN 16  6 - 23 mg/dL   Creatinine, Ser 8.11  0.50 - 1.10 mg/dL   Calcium 91.4  8.4 - 78.2 mg/dL   GFR calc non Af Amer >90  >90 mL/min   GFR calc Af Amer >90  >90 mL/min  CBC     Status: Abnormal   Collection Time    07/07/12  6:02 PM      Result Value Range   WBC 8.9  4.0 - 10.5 K/uL   RBC 3.82 (*) 3.87 - 5.11 MIL/uL   Hemoglobin 11.3 (*) 12.0 - 15.0 g/dL   HCT 95.6 (*) 21.3 - 08.6 %   MCV 89.3  78.0 - 100.0 fL   MCH 29.6  26.0 - 34.0 pg   MCHC 33.1  30.0 - 36.0 g/dL   RDW 57.8  46.9 - 62.9 %   Platelets 276  150 - 400 K/uL  VITAMIN B12     Status: None   Collection Time    07/07/12  6:02 PM      Result Value Range   Vitamin B-12 397  211 - 911 pg/mL  FOLATE     Status: None   Collection Time    07/07/12  6:02 PM       Result Value Range   Folate >20.0    IRON AND TIBC     Status: Abnormal   Collection Time    07/07/12  6:02 PM      Result Value Range   Iron 35 (*) 42 - 135 ug/dL   TIBC 528  413 - 244 ug/dL   Saturation Ratios 12 (*) 20 - 55 %   UIBC 268  125 - 400 ug/dL  FERRITIN     Status: None   Collection Time    07/07/12  6:02 PM      Result Value Range   Ferritin 59  10 - 291 ng/mL  RETICULOCYTES     Status: Abnormal   Collection Time    07/07/12  6:02 PM      Result Value Range   Retic Ct Pct 1.4  0.4 - 3.1 %   RBC. 3.82 (*) 3.87 - 5.11 MIL/uL   Retic Count, Manual 53.5  19.0 - 186.0 K/uL  CBC     Status: Abnormal   Collection Time    07/08/12  6:15 AM      Result Value Range   WBC 7.2  4.0 - 10.5 K/uL   RBC 3.62 (*) 3.87 - 5.11 MIL/uL   Hemoglobin 10.6 (*) 12.0 - 15.0 g/dL   HCT 01.0 (*) 27.2 - 53.6 %   MCV 92.0  78.0 - 100.0 fL   MCH 29.3  26.0 - 34.0 pg   MCHC 31.8  30.0 - 36.0 g/dL   RDW 64.4  03.4 - 74.2 %   Platelets 261  150 - 400 K/uL   No results found.   Assessment/Plan  [name] is a 72 y.o. year old female presenting with Rectal Bleeding 1. Rectal Bleeding  1. Consult to GI, greatly appreciate recs.  Will defer to outpatient colonoscopy as long as pt does not have any more bleeding episodes.  Needs to avoid NSAIDS as well 2. Hgb stable x 4 draws.  Iron studies consistent with iron deficiency anemia.  Will need to be on iron consistently in outpatient setting.  Will start PO iron while inpatient as well  3. Retic Count slightly low, will get peripheal smear 2. HTN - Holding Lisinopril, possible allergy, will not restart upon d/c  3. HLD - Continue statin 4. GERD - Protonix 5. FEN/GI: KVO, Regular diet  Prophylaxis:  SCDs, PPI Disposition: Most likely d/c today.  Code Status: Full   Angelo Prindle R. [name] Fusi, DO of [facility] 07/08/2012, 8:27 AM

## 2012-07-08 NOTE — H&P (Signed)
See my note from 3-26

## 2012-07-08 NOTE — Progress Notes (Signed)
Pt had x2 watery brownish to clear stool this am,no blood noted,no complaints voiced.

## 2012-07-08 NOTE — Discharge Summary (Signed)
FMTS Attending Admission Note: [name] Smolen,MD I  have seen and examined this patient, reviewed their chart. I have discussed this patient with the resident. I agree with the resident's findings, assessment and care plan.  

## 2012-07-11 DIAGNOSIS — M75120 Complete rotator cuff tear or rupture of unspecified shoulder, not specified as traumatic: Secondary | ICD-10-CM | POA: Diagnosis not present

## 2012-07-11 DIAGNOSIS — Z96619 Presence of unspecified artificial shoulder joint: Secondary | ICD-10-CM | POA: Diagnosis not present

## 2012-07-13 DIAGNOSIS — M75120 Complete rotator cuff tear or rupture of unspecified shoulder, not specified as traumatic: Secondary | ICD-10-CM | POA: Diagnosis not present

## 2012-07-13 DIAGNOSIS — Z96619 Presence of unspecified artificial shoulder joint: Secondary | ICD-10-CM | POA: Diagnosis not present

## 2012-07-14 ENCOUNTER — Ambulatory Visit (INDEPENDENT_AMBULATORY_CARE_PROVIDER_SITE_OTHER): Payer: MEDICARE | Admitting: Emergency Medicine

## 2012-07-14 VITALS — BP 120/80 | HR 74 | Temp 98.0°F | Resp 16 | Ht 59.0 in | Wt 250.0 lb

## 2012-07-14 DIAGNOSIS — D649 Anemia, unspecified: Secondary | ICD-10-CM | POA: Diagnosis not present

## 2012-07-14 DIAGNOSIS — K644 Residual hemorrhoidal skin tags: Secondary | ICD-10-CM

## 2012-07-14 DIAGNOSIS — K625 Hemorrhage of anus and rectum: Secondary | ICD-10-CM

## 2012-07-14 LAB — POCT CBC
Lymph, poc: 2.2 (ref 0.6–3.4)
MCH, POC: 28.1 pg (ref 27–31.2)
MCV: 93.3 fL (ref 80–97)
MID (cbc): 0.5 (ref 0–0.9)
POC LYMPH PERCENT: 30.6 %L (ref 10–50)
Platelet Count, POC: 354 10*3/uL (ref 142–424)
RDW, POC: 16 %
WBC: 7.3 10*3/uL (ref 4.6–10.2)

## 2012-07-14 LAB — IFOBT (OCCULT BLOOD): IFOBT: NEGATIVE

## 2012-07-14 NOTE — Progress Notes (Signed)
  Subjective:    Patient ID: [name], female    DOB: 1941/03/13, 72 y.o.   MRN: [id number]  HPI Pt returns to clinic today for hospital follow up. Pt states she is feeling much better than last Thursday when she was sent out of our office by EMS. While hospitalized she did drop her hemoglobin from 11.3-10.6. She was discharged after 24 ours of observation. She was seen in the hospital by Dr. Bosie Clos. She has a followup appointment scheduled for him. She has done well since discharge. Her bowel movements are still blackish at times she attributes to the iron she is taking.   Review of Systems     Objective:   Physical Exam  The abdomen is soft. The liver and spleen are not enlarged. Rectal exam reveals brown stool in the vault.  Results for orders placed in visit on 07/14/12  IFOBT (OCCULT BLOOD)      Result Value Range   IFOBT Negative    POCT CBC      Result Value Range   WBC 7.3  4.6 - 10.2 K/uL   Lymph, poc 2.2  0.6 - 3.4   POC LYMPH PERCENT 30.6  10 - 50 %L   MID (cbc) 0.5  0 - 0.9   POC MID % 6.5  0 - 12 %M   POC Granulocyte 4.6  2 - 6.9   Granulocyte percent 62.9  37 - 80 %G   RBC 4.16  4.04 - 5.48 M/uL   Hemoglobin 11.7 (*) 12.2 - 16.2 g/dL   HCT, POC 78.2  95.6 - 47.9 %   MCV 93.3  80 - 97 fL   MCH, POC 28.1  27 - 31.2 pg   MCHC 30.2 (*) 31.8 - 35.4 g/dL   RDW, POC 21.3     Platelet Count, POC 354  142 - 424 K/uL   MPV 8.3  0 - 99.8 fL       Assessment & Plan:  Patient significantly better today. Her rectal exam revealed brown stool which was sent for hemosure. Hemoglobin is up to 11.7 and stool is negative for blood

## 2012-07-20 ENCOUNTER — Telehealth: Payer: Self-pay

## 2012-07-20 MED ORDER — FUROSEMIDE 20 MG PO TABS: 20.0000 mg | ORAL_TABLET | Freq: Every day | ORAL | Status: DC

## 2012-07-20 MED ORDER — PRAVASTATIN SODIUM 40 MG PO TABS: 40.0000 mg | ORAL_TABLET | Freq: Two times a day (BID) | ORAL | Status: DC

## 2012-07-20 NOTE — Telephone Encounter (Signed)
PATIENT IS CALLING BECAUSE HER PHARMACY IS WAITING TO GET AUTHORIZATION FROM DR DAUB FOR A CHOLESTEROL MEDICATION AND ANOTHER MEDICATION. PATIENT DOES NOT KNOW THE NAMES OF EITHER MEDICATION AND WAS VERY HARD TO UNDERSTAND OVER THE PHONE. [phone].

## 2012-07-20 NOTE — Telephone Encounter (Signed)
Lasix and pravachol sent in.

## 2012-07-25 DIAGNOSIS — E782 Mixed hyperlipidemia: Secondary | ICD-10-CM | POA: Diagnosis not present

## 2012-07-25 DIAGNOSIS — I10 Essential (primary) hypertension: Secondary | ICD-10-CM | POA: Diagnosis not present

## 2012-08-15 DIAGNOSIS — M75120 Complete rotator cuff tear or rupture of unspecified shoulder, not specified as traumatic: Secondary | ICD-10-CM | POA: Diagnosis not present

## 2012-08-30 DIAGNOSIS — M169 Osteoarthritis of hip, unspecified: Secondary | ICD-10-CM | POA: Diagnosis not present

## 2012-08-30 DIAGNOSIS — M1610 Unilateral primary osteoarthritis, unspecified hip: Secondary | ICD-10-CM | POA: Diagnosis not present

## 2012-08-30 DIAGNOSIS — M76899 Other specified enthesopathies of unspecified lower limb, excluding foot: Secondary | ICD-10-CM | POA: Diagnosis not present

## 2012-08-31 ENCOUNTER — Other Ambulatory Visit: Payer: Self-pay | Admitting: Physician Assistant

## 2012-10-06 ENCOUNTER — Other Ambulatory Visit: Payer: Self-pay | Admitting: Emergency Medicine

## 2012-10-06 DIAGNOSIS — M25559 Pain in unspecified hip: Secondary | ICD-10-CM | POA: Diagnosis not present

## 2012-10-06 DIAGNOSIS — Z1231 Encounter for screening mammogram for malignant neoplasm of breast: Secondary | ICD-10-CM

## 2012-10-18 ENCOUNTER — Other Ambulatory Visit: Payer: Self-pay | Admitting: Physician Assistant

## 2012-10-19 ENCOUNTER — Ambulatory Visit (HOSPITAL_COMMUNITY)
Admission: RE | Admit: 2012-10-19 | Discharge: 2012-10-19 | Disposition: A | Discharge status: Home / Self Care | Payer: MEDICARE | Source: Ambulatory Visit | Attending: Emergency Medicine | Admitting: Emergency Medicine

## 2012-10-19 DIAGNOSIS — Z1231 Encounter for screening mammogram for malignant neoplasm of breast: Secondary | ICD-10-CM | POA: Diagnosis not present

## 2012-10-24 ENCOUNTER — Other Ambulatory Visit: Payer: Self-pay | Admitting: Physician Assistant

## 2012-11-02 ENCOUNTER — Other Ambulatory Visit: Payer: Self-pay | Admitting: Emergency Medicine

## 2012-11-03 ENCOUNTER — Other Ambulatory Visit: Payer: Self-pay | Admitting: Radiology

## 2012-11-03 NOTE — Telephone Encounter (Signed)
faxed

## 2012-11-17 ENCOUNTER — Other Ambulatory Visit: Payer: Self-pay | Admitting: Physician Assistant

## 2012-11-29 ENCOUNTER — Ambulatory Visit (INDEPENDENT_AMBULATORY_CARE_PROVIDER_SITE_OTHER): Payer: MEDICARE | Admitting: Emergency Medicine

## 2012-11-29 VITALS — BP 122/88 | HR 75 | Temp 98.1°F | Resp 18 | Ht 59.0 in | Wt 254.0 lb

## 2012-11-29 DIAGNOSIS — F3289 Other specified depressive episodes: Secondary | ICD-10-CM

## 2012-11-29 DIAGNOSIS — F32A Depression, unspecified: Secondary | ICD-10-CM

## 2012-11-29 DIAGNOSIS — I10 Essential (primary) hypertension: Secondary | ICD-10-CM | POA: Diagnosis not present

## 2012-11-29 DIAGNOSIS — F329 Major depressive disorder, single episode, unspecified: Secondary | ICD-10-CM | POA: Diagnosis not present

## 2012-11-29 DIAGNOSIS — E782 Mixed hyperlipidemia: Secondary | ICD-10-CM

## 2012-11-29 LAB — LIPID PANEL
HDL: 68 mg/dL (ref >39)
LDL Cholesterol: 111 mg/dL — ABNORMAL HIGH (ref 0–99)
Total CHOL/HDL Ratio: 2.8 Ratio
Triglycerides: 58 mg/dL (ref <150)
VLDL: 12 mg/dL (ref 0–40)

## 2012-11-29 LAB — POCT CBC
Granulocyte percent: 63.4 %G (ref 37–80)
HCT, POC: 40.7 % (ref 37.7–47.9)
Hemoglobin: 12.1 g/dL — AB (ref 12.2–16.2)
MCH, POC: 28.9 pg (ref 27–31.2)
MCHC: 29.7 g/dL — AB (ref 31.8–35.4)
POC Granulocyte: 4.9 (ref 2–6.9)
POC MID %: 6.7 %M (ref 0–12)
Platelet Count, POC: 319 10*3/uL (ref 142–424)

## 2012-11-29 LAB — COMPREHENSIVE METABOLIC PANEL
Alkaline Phosphatase: 66 U/L (ref 39–117)
BUN: 14 mg/dL (ref 6–23)
Glucose, Bld: 88 mg/dL (ref 70–99)
Total Bilirubin: 0.5 mg/dL (ref 0.3–1.2)

## 2012-11-29 LAB — POCT UA - MICROSCOPIC ONLY
Casts, Ur, LPF, POC: NEGATIVE
Mucus, UA: NEGATIVE
Yeast, UA: NEGATIVE

## 2012-11-29 LAB — POCT URINALYSIS DIPSTICK
Blood, UA: NEGATIVE
Protein, UA: NEGATIVE
Spec Grav, UA: 1.015
Urobilinogen, UA: 0.2

## 2012-11-29 MED ORDER — FUROSEMIDE 20 MG PO TABS: 20.0000 mg | ORAL_TABLET | Freq: Every day | ORAL | Status: DC

## 2012-11-29 MED ORDER — LISINOPRIL 20 MG PO TABS: 20.0000 mg | ORAL_TABLET | Freq: Every day | ORAL | Status: DC

## 2012-11-29 NOTE — Patient Instructions (Addendum)

## 2012-11-29 NOTE — Progress Notes (Signed)
[facility] [address], [place] [address] [phone]  Date:  11/29/2012   Name:  [name]   DOB:  31-May-1940   MRN:  [id number]  PCP:  Lucilla Edin, MD    Chief Complaint: Medication Refill   History of Present Illness:  [name] is a 72 y.o. very pleasant female patient who presents with the following:  For repeat labs and refills on meds.  Tolerating with no adverse effects.  Eating well. No further rectal bleeding.  No dyspepsia.  No further facial swelling.  No improvement with over the counter medications or other home remedies. Denies other complaint or health concern today.   Patient Active Problem List   Diagnosis Date Noted  . Rectal bleeding 07/07/2012  . Hypertension 07/07/2012  . GERD (gastroesophageal reflux disease) 07/07/2012  . Hyperlipidemia 07/07/2012  . Rotator cuff tear, left 03/02/2012  . Osteoarthritis of left shoulder 01/21/2012    Past Medical History  Diagnosis Date  . Anemia     on iron  . Hypertension   . Hyperlipidemia     on med  . Anxiety   . Arthritis     shoulders, knees, hands, hips  . Depression   . Pneumonia 2003  . Hyperlipemia   . Constipation   . GERD (gastroesophageal reflux disease)     TAKES TUMS    Past Surgical History  Procedure Laterality Date  . [place] teeth ext    . Svd      x 7  . Tubal ligation    . Knee surgery  11/2001    left knee  . Replacement total knee  10/2009    left knee  . Carpel tunnel release      left hand  . Colonscopy    . Joint replacement  10/2009    left knee  . Total shoulder replacement  01/19/2012    left shoulder  . Total shoulder arthroplasty  01/18/2012    Procedure: TOTAL SHOULDER ARTHROPLASTY;  Surgeon: Mable Paris, MD;  Location: [facility] OR;  Service: Orthopedics;  Laterality: Left;  . Shoulder open rotator cuff repair  03/01/2012    Procedure: ROTATOR CUFF REPAIR SHOULDER OPEN;  Surgeon: Mable Paris, MD;  Location: [facility]  OR;  Service: Orthopedics;  Laterality: Left;  SUBSCAPULARIS REPAIR    History  Substance Use Topics  . Smoking status: Former Smoker -- 1.00 packs/day for 25 years    Types: Cigarettes    Quit date: 03/07/1984  . Smokeless tobacco: Former Neurosurgeon  . Alcohol Use: No    Family History  Problem Relation Age of Onset  . Heart disease Mother   . Heart disease Father   . Heart disease Sister   . Hypertension Sister   . Heart disease Sister   . Hypertension Sister   . Heart disease Sister   . Hypertension Sister     Allergies  Allergen Reactions  . Effexor [Venlafaxine Hcl]     Lip swelling patient advised to d/c  . Lisinopril     Lip swelling patient advised to d/c  . Sulfa Drugs Cross Reactors Itching    Medication list has been reviewed and updated.  Current Outpatient Prescriptions on File Prior to Visit  Medication Sig Dispense Refill  . aspirin EC 81 MG tablet Take 81 mg by mouth daily.      Marland Kitchen docusate sodium 100 MG CAPS Take 100 mg by mouth 2 (two) times daily  as needed for constipation.  20 capsule  0  . furosemide (LASIX) 20 MG tablet TAKE ONE TABLET BY MOUTH ONCE DAILY  30 tablet  0  . ibuprofen (ADVIL,MOTRIN) 200 MG tablet Take 200 mg by mouth every 8 (eight) hours as needed for pain.      . IRON PO Take 55 mg by mouth daily with breakfast. Per pt report      . lisinopril (PRINIVIL,ZESTRIL) 20 MG tablet Take 20 mg by mouth daily.      Marland Kitchen lisinopril (PRINIVIL,ZESTRIL) 20 MG tablet TAKE 1 TABLET BY MOUTH EVERY DAY  90 tablet  0  . LORazepam (ATIVAN) 0.5 MG tablet TAKE ONE TABLET BY MOUTH EVERY 8 HOURS FOR ANXIETY  30 tablet  0  . pravastatin (PRAVACHOL) 40 MG tablet Take 1 tablet (40 mg total) by mouth 2 (two) times daily. PATIENT NEEDS OFFICE VISIT/LABS FOR ADDITIONAL REFILLS  60 tablet  0   No current facility-administered medications on file prior to visit.    Review of Systems:  As per HPI, otherwise negative.    Physical Examination: Filed Vitals:    11/29/12 0952  BP: 122/88  Pulse: 75  Temp: 98.1 F (36.7 C)  Resp: 18   Filed Vitals:   11/29/12 0952  Height: 4\' 11"  (1.499 m)  Weight: 254 lb (115.214 kg)   Body mass index is 51.27 kg/(m^2). Ideal Body Weight: Weight in (lb) to have BMI = 25: 123.5  GEN: obese, NAD, Non-toxic, A & O x 3 HEENT: Atraumatic, Normocephalic. Neck supple. No masses, No LAD. Ears and Nose: No external deformity. CV: RRR, No M/G/R. No JVD. No thrill. No extra heart sounds. PULM: CTA B, no wheezes, crackles, rhonchi. No retractions. No resp. distress. No accessory muscle use. ABD: S, NT, ND, +BS. No rebound. No HSM. EXTR: No c/c/e NEURO Normal gait.  PSYCH: Normally interactive. Conversant. Not depressed or anxious appearing.  Calm demeanor.    Assessment and Plan: Hypertension Hyperlipidemia Depression Labs Follow up based on labs   Signed,  Phillips Odor, MD

## 2012-11-29 NOTE — Addendum Note (Signed)
Addended by: Carmelina Dane on: 11/29/2012 04:31 PM   Modules accepted: Orders

## 2012-11-30 ENCOUNTER — Other Ambulatory Visit: Payer: Self-pay

## 2012-11-30 MED ORDER — PRAVASTATIN SODIUM 40 MG PO TABS: 40.0000 mg | ORAL_TABLET | Freq: Two times a day (BID) | ORAL | Status: DC

## 2012-12-01 MED ORDER — PRAVASTATIN SODIUM 40 MG PO TABS: 40.0000 mg | ORAL_TABLET | Freq: Two times a day (BID) | ORAL | Status: DC

## 2012-12-01 NOTE — Addendum Note (Signed)
Addended by: Carmelina Dane on: 12/01/2012 04:08 PM   Modules accepted: Orders

## 2012-12-13 ENCOUNTER — Other Ambulatory Visit: Payer: Self-pay | Admitting: Emergency Medicine

## 2012-12-21 DIAGNOSIS — M19019 Primary osteoarthritis, unspecified shoulder: Secondary | ICD-10-CM | POA: Diagnosis not present

## 2013-01-05 DIAGNOSIS — M25559 Pain in unspecified hip: Secondary | ICD-10-CM | POA: Diagnosis not present

## 2013-02-12 ENCOUNTER — Other Ambulatory Visit: Payer: Self-pay | Admitting: Emergency Medicine

## 2013-02-15 ENCOUNTER — Other Ambulatory Visit: Payer: Self-pay | Admitting: Emergency Medicine

## 2013-02-17 ENCOUNTER — Other Ambulatory Visit: Payer: Self-pay | Admitting: Radiology

## 2013-02-17 NOTE — Telephone Encounter (Signed)
Called in.

## 2013-04-18 ENCOUNTER — Other Ambulatory Visit: Payer: Self-pay | Admitting: Emergency Medicine

## 2013-04-20 NOTE — Telephone Encounter (Signed)
RF or RTC?

## 2013-04-21 ENCOUNTER — Other Ambulatory Visit: Payer: Self-pay | Admitting: Emergency Medicine

## 2013-04-21 NOTE — Telephone Encounter (Signed)
This has already been done.

## 2013-04-21 NOTE — Telephone Encounter (Signed)
Last office visit in August with Dr. Dareen PianoAnderson, however you are primary pre scriber for this medication. Please advise refill.

## 2013-04-21 NOTE — Telephone Encounter (Signed)
Faxed Rx

## 2013-05-29 ENCOUNTER — Ambulatory Visit: Payer: MEDICARE

## 2013-07-06 ENCOUNTER — Ambulatory Visit (INDEPENDENT_AMBULATORY_CARE_PROVIDER_SITE_OTHER): Payer: MEDICARE | Admitting: Emergency Medicine

## 2013-07-06 ENCOUNTER — Other Ambulatory Visit: Payer: Self-pay | Admitting: Emergency Medicine

## 2013-07-06 ENCOUNTER — Telehealth: Payer: Self-pay | Admitting: *Deleted

## 2013-07-06 VITALS — BP 130/80 | HR 93 | Temp 98.2°F | Resp 16 | Ht 59.0 in | Wt 253.0 lb

## 2013-07-06 DIAGNOSIS — I10 Essential (primary) hypertension: Secondary | ICD-10-CM | POA: Diagnosis not present

## 2013-07-06 DIAGNOSIS — K6289 Other specified diseases of anus and rectum: Secondary | ICD-10-CM

## 2013-07-06 DIAGNOSIS — E785 Hyperlipidemia, unspecified: Secondary | ICD-10-CM | POA: Diagnosis not present

## 2013-07-06 LAB — POCT CBC
Granulocyte percent: 62.8 %G (ref 37–80)
HEMATOCRIT: 39.5 % (ref 37.7–47.9)
HEMOGLOBIN: 12 g/dL — AB (ref 12.2–16.2)
LYMPH, POC: 2.2 (ref 0.6–3.4)
MCH: 29.1 pg (ref 27–31.2)
MCHC: 30.4 g/dL — AB (ref 31.8–35.4)
MCV: 95.6 fL (ref 80–97)
MID (cbc): 0.4 (ref 0–0.9)
MPV: 8.4 fL (ref 0–99.8)
POC GRANULOCYTE: 4.5 (ref 2–6.9)
POC LYMPH %: 31 % (ref 10–50)
POC MID %: 6.2 % (ref 0–12)
Platelet Count, POC: 324 10*3/uL (ref 142–424)
RBC: 4.13 M/uL (ref 4.04–5.48)
RDW, POC: 15.2 %
WBC: 7.2 10*3/uL (ref 4.6–10.2)

## 2013-07-06 LAB — LIPID PANEL
CHOL/HDL RATIO: 3.2 ratio
CHOLESTEROL: 194 mg/dL (ref 0–200)
HDL: 61 mg/dL (ref >39)
LDL Cholesterol: 119 mg/dL — ABNORMAL HIGH (ref 0–99)
TRIGLYCERIDES: 71 mg/dL (ref <150)
VLDL: 14 mg/dL (ref 0–40)

## 2013-07-06 LAB — COMPREHENSIVE METABOLIC PANEL
ALK PHOS: 71 U/L (ref 39–117)
ALT: 10 U/L (ref 0–35)
AST: 19 U/L (ref 0–37)
Albumin: 4.1 g/dL (ref 3.5–5.2)
BILIRUBIN TOTAL: 0.4 mg/dL (ref 0.2–1.2)
BUN: 12 mg/dL (ref 6–23)
CO2: 31 meq/L (ref 19–32)
Calcium: 10.2 mg/dL (ref 8.4–10.5)
Chloride: 103 mEq/L (ref 96–112)
Creat: 0.63 mg/dL (ref 0.50–1.10)
GLUCOSE: 91 mg/dL (ref 70–99)
Potassium: 4.3 mEq/L (ref 3.5–5.3)
SODIUM: 141 meq/L (ref 135–145)
TOTAL PROTEIN: 7.5 g/dL (ref 6.0–8.3)

## 2013-07-06 LAB — IFOBT (OCCULT BLOOD): IMMUNOLOGICAL FECAL OCCULT BLOOD TEST: NEGATIVE

## 2013-07-06 MED ORDER — HYDROCORTISONE ACETATE 25 MG RE SUPP: 25.0000 mg | Freq: Two times a day (BID) | RECTAL | Status: DC

## 2013-07-06 NOTE — Progress Notes (Addendum)
Subjective:    Patient ID: [name], female    DOB: November 21, 1940, 73 y.o.   MRN: [id number] This chart was scribed for Collene Gobble, MD by Valera Castle, ED Scribe. This patient was seen in room 03 and the patient's care was started at 7:52 AM.  Chief Complaint  Patient presents with  . Follow-up    Pain during bowel movement   HPI [name] is a 73 y.o. female Pt presents with pain during bowel movements.   She reports burning pain around her anus that radiates up to her back. She states that her pain is relieved with bowel movements She takes stool softeners every day, and states most of her bowel movements do not require excessive pressure as a result. She reports getting enough fiber in her system daily.   She reports being due for another colonoscopy in 2 years.   PCP - Lucilla Edin, MD  Patient Active Problem List   Diagnosis Date Noted  . Rectal bleeding 07/07/2012  . Hypertension 07/07/2012  . GERD (gastroesophageal reflux disease) 07/07/2012  . Hyperlipidemia 07/07/2012  . Rotator cuff tear, left 03/02/2012  . Osteoarthritis of left shoulder 01/21/2012   Prior to Admission medications   Medication Sig Start Date End Date Taking? Authorizing Provider  aspirin EC 81 MG tablet Take 81 mg by mouth daily.   Yes Historical Provider, MD  docusate sodium 100 MG CAPS Take 100 mg by mouth 2 (two) times daily as needed for constipation. 07/08/12  Yes Bryan R Hess, DO  furosemide (LASIX) 20 MG tablet Take 1 tablet (20 mg total) by mouth daily. 11/29/12  Yes Phillips Odor, MD  ibuprofen (ADVIL,MOTRIN) 200 MG tablet Take 200 mg by mouth every 8 (eight) hours as needed for pain.   Yes Historical Provider, MD  IRON PO Take 55 mg by mouth daily with breakfast. Per pt report   Yes Historical Provider, MD  lisinopril (PRINIVIL,ZESTRIL) 20 MG tablet Take 1 tablet (20 mg total) by mouth daily. 11/29/12  Yes Phillips Odor, MD  LORazepam (ATIVAN) 0.5 MG tablet TAKE ONE TABLET BY  MOUTH EVERY 8 HOURS FOR ANXIETY 04/18/13  Yes Collene Gobble, MD  pravastatin (PRAVACHOL) 40 MG tablet Take 1 tablet (40 mg total) by mouth 2 (two) times daily. PATIENT NEEDS OFFICE VISIT/LABS FOR ADDITIONAL REFILLS - 2nd NOTICE 11/30/12  Yes Collene Gobble, MD  pravastatin (PRAVACHOL) 40 MG tablet Take 1 tablet (40 mg total) by mouth 2 (two) times daily. 12/01/12  Yes Phillips Odor, MD    Review of Systems  Constitutional: Negative for fever.  Gastrointestinal: Positive for rectal pain. Negative for abdominal pain, constipation and blood in stool.  Musculoskeletal: Positive for back pain (lower).   BP 130/80  Pulse 93  Temp(Src) 98.2 F (36.8 C) (Oral)  Resp 16  Ht 4\' 11"  (1.499 m)  Wt 253 lb (114.76 kg)  BMI 51.07 kg/m2  SpO2 97%    Objective:   Physical Exam Nursing note and vitals reviewed. Constitutional: Pt is oriented to person, place, and time. Pt appears well-developed and well-nourished. No distress. Morbid obesity. HENT: Right and left external ear normal. Head: Normocephalic and atraumatic.  Eyes: EOM are normal.  Neck: Neck supple. Cardiovascular: Normal rate, regular rhythm and normal heart sounds.  Exam reveals no gallop and no friction rub. No murmur heard. Pulmonary/Chest: Effort normal and breath sounds normal. No respiratory distress. Pt has no wheezes. Pt has no rales.  Abdominal: Soft. Bowel  sounds are normal. There is no tenderness. There is no rebound and no guarding. No hepatosplenomegaly. No CVA tenderness.  Musculoskeletal: Normal range of motion. No edema. No tenderness over lower back. Neurological: Pt is alert and oriented to person, place, and time.  Skin: Skin is warm and dry.  Psychiatric: Pt has a normal mood and affect. Pt's behavior is normal.  GU: Rectal exam normal.     Results for orders placed in visit on 07/06/13  IFOBT (OCCULT BLOOD)      Result Value Ref Range   IFOBT Negative    POCT CBC      Result Value Ref Range   WBC 7.2  4.6 -  10.2 K/uL   Lymph, poc 2.2  0.6 - 3.4   POC LYMPH PERCENT 31.0  10 - 50 %L   MID (cbc) 0.4  0 - 0.9   POC MID % 6.2  0 - 12 %M   POC Granulocyte 4.5  2 - 6.9   Granulocyte percent 62.8  37 - 80 %G   RBC 4.13  4.04 - 5.48 M/uL   Hemoglobin 12.0 (*) 12.2 - 16.2 g/dL   HCT, POC 04.539.5  40.937.7 - 47.9 %   MCV 95.6  80 - 97 fL   MCH, POC 29.1  27 - 31.2 pg   MCHC 30.4 (*) 31.8 - 35.4 g/dL   RDW, POC 81.115.2     Platelet Count, POC 324  142 - 424 K/uL   MPV 8.4  0 - 99.8 fL   Assessment & Plan:  We'll treat with Anusol-HC suppositories. She looks good and needs to continue to work on weight loss. This is her major problem. She is due for colonoscopy in 2 years .      I personally performed the services described in this documentation, which was scribed in my presence. The recorded information has been reviewed and is accurate.

## 2013-07-06 NOTE — Telephone Encounter (Signed)
She can use Preparation H suppositories twice a day and this should be cheaper

## 2013-07-06 NOTE — Telephone Encounter (Signed)
Pt called again asking if dr Cleta Albertsdaub had samples of suppositories as an option. I suggested she also ask her pharmacist for generic option.   Please call pt.   bf

## 2013-07-06 NOTE — Telephone Encounter (Signed)
She can use preparation H. suppositories twice a day

## 2013-07-06 NOTE — Telephone Encounter (Signed)
Dr. Cleta Albertsaub:  [facility] you ordered for her and they are over $90 and she cannot afford them.  Can you suggest something else, perhaps over the counter.  Her number is [phone]

## 2013-07-07 ENCOUNTER — Other Ambulatory Visit: Payer: Self-pay | Admitting: Emergency Medicine

## 2013-07-07 ENCOUNTER — Other Ambulatory Visit: Payer: Self-pay | Admitting: Radiology

## 2013-07-07 NOTE — Telephone Encounter (Signed)
Spoke to pt, she is aware 

## 2013-07-07 NOTE — Telephone Encounter (Signed)
Called in.

## 2013-07-13 ENCOUNTER — Encounter: Payer: Self-pay | Admitting: *Deleted

## 2013-07-17 ENCOUNTER — Encounter: Payer: Self-pay | Admitting: Cardiology

## 2013-07-17 ENCOUNTER — Ambulatory Visit (INDEPENDENT_AMBULATORY_CARE_PROVIDER_SITE_OTHER): Payer: MEDICARE | Admitting: Cardiology

## 2013-07-17 VITALS — BP 120/80 | HR 62 | Ht 60.0 in | Wt 253.9 lb

## 2013-07-17 DIAGNOSIS — E785 Hyperlipidemia, unspecified: Secondary | ICD-10-CM

## 2013-07-17 DIAGNOSIS — E8881 Metabolic syndrome: Secondary | ICD-10-CM

## 2013-07-17 DIAGNOSIS — Z6841 Body Mass Index (BMI) 40.0 and over, adult: Secondary | ICD-10-CM

## 2013-07-17 DIAGNOSIS — R609 Edema, unspecified: Secondary | ICD-10-CM

## 2013-07-17 DIAGNOSIS — I10 Essential (primary) hypertension: Secondary | ICD-10-CM

## 2013-07-17 DIAGNOSIS — R600 Localized edema: Secondary | ICD-10-CM

## 2013-07-17 DIAGNOSIS — E889 Metabolic disorder, unspecified: Secondary | ICD-10-CM

## 2013-07-17 NOTE — Patient Instructions (Signed)
Your physician encouraged you to lose weight for better health.  Dr Herbie BaltimoreHarding wants you to follow-up in 1 year. You will receive a reminder letter in the mail one months in advance. If you don't receive a letter, please call our office to schedule the follow-up appointment.

## 2013-07-18 ENCOUNTER — Encounter: Payer: Self-pay | Admitting: Cardiology

## 2013-07-18 DIAGNOSIS — E785 Hyperlipidemia, unspecified: Secondary | ICD-10-CM | POA: Insufficient documentation

## 2013-07-18 DIAGNOSIS — E8881 Metabolic syndrome: Secondary | ICD-10-CM | POA: Insufficient documentation

## 2013-07-18 DIAGNOSIS — R600 Localized edema: Secondary | ICD-10-CM | POA: Insufficient documentation

## 2013-07-18 DIAGNOSIS — E669 Obesity, unspecified: Secondary | ICD-10-CM | POA: Insufficient documentation

## 2013-07-18 NOTE — Progress Notes (Signed)
PATIENT: [name] MRN: [id number]  DOB: 1940/08/07   DOV:07/18/2013 PCP: Lucilla Edin, MD  Clinic Note: Chief Complaint  Patient presents with  . Annual Exam    no chest pain , sob  ,edema  left leg, ---needs knee and hip surgery   HPI: [name] is a 73 y.o.  female with a PMH below who presents today for annual followup of her cardiac risk factors including hypertension, obesity and dyslipidemia. She has had a cardiac evaluation to date has been relatively free of any significant findings. She does have metabolic syndrome with obesity, and hypertension and dyslipidemia.  Interval History: She presents today free of any cardiac symptoms of chest pressure with rest or exertion. She does get a little short of breath if she does much activity but mostly to deconditioning as she is very sedentary. She basically just laughs when you talk about weight loss. Most of the symptoms she notes her related to musculoskeletal discomfort with her knee right hip pain. She is was beginning surgery but is hoping to go on a cruise in the fall.  Cardiovascular ROS: no chest pain or dyspnea on exertion positive for - Stable chronic edema negative for - irregular heartbeat, loss of consciousness, murmur, orthopnea, palpitations, paroxysmal nocturnal dyspnea, rapid heart rate or shortness of breath:; Negative for lightheadedness, CK/near-syncope, TIA/amaurosis fugax, melena, hematochezia or hematuria. No claudication or epistaxis.  Past Medical History  Diagnosis Date  . Anemia     on iron  . Hypertension   . Hyperlipidemia     on med  . Anxiety   . Arthritis     shoulders, knees, hands, hips; status post left knee surgery  . Depression   . H/O: pneumonia 2003  . Hyperlipemia   . Constipation   . GERD (gastroesophageal reflux disease)     TAKES TUMS  . Morbid obesity with BMI of 45.0-49.9, adult     Prior Cardiac Evaluation and Past Surgical History: Past Surgical History  Procedure  Laterality Date  . Wisdom tooth extraction    . Svd      x 7  . Tubal ligation    . Knee surgery Left 11/2001  . Replacement total knee Left 10/2009  . Carpal tunnel release Left   . Colonoscopy    . Replacement total knee Left 10/2009  . Total shoulder replacement Left 01/19/2012  . Total shoulder arthroplasty  01/18/2012    Procedure: TOTAL SHOULDER ARTHROPLASTY;  Surgeon: Mable Paris, MD;  Location: [facility] OR;  Service: Orthopedics;  Laterality: Left;  . Shoulder open rotator cuff repair  03/01/2012    Procedure: ROTATOR CUFF REPAIR SHOULDER OPEN;  Surgeon: Mable Paris, MD;  Location[facility] OR;  Service: Orthopedics;  Laterality: Left;  SUBSCAPULARIS REPAIR  . Transthoracic echocardiogram  06/2011    LV mildly dilated, EF=>55%; mild mitral annular calcif, mild MR; mild TR; AV mildly sclerotic, mild aortic regurg  . Stress test - adenosine thallium  1997    apical and inferior segments with questionable infarct and ischemia in anteroseptal & inferoseptal segments (Dr. Standley Brooking)  . Cardiac catheterization  04/20/1996    patent coronaries, EF 74% (Dr. Aram Candela)    Allergies  Allergen Reactions  . Effexor [Venlafaxine Hcl]     Lip swelling patient advised to d/c  . Lisinopril     Lip swelling patient advised to d/c  . Sulfa Drugs Cross Reactors Itching    Current Outpatient Prescriptions  Medication Sig  Dispense Refill  . aspirin EC 81 MG tablet Take 81 mg by mouth daily.      Marland Kitchen docusate sodium 100 MG CAPS Take 100 mg by mouth 2 (two) times daily as needed for constipation.  20 capsule  0  . furosemide (LASIX) 20 MG tablet Take 1 tablet (20 mg total) by mouth daily.  90 tablet  3  . ibuprofen (ADVIL,MOTRIN) 200 MG tablet Take 200 mg by mouth every 8 (eight) hours as needed for pain.      . IRON PO Take 55 mg by mouth daily with breakfast. Per pt report      . lisinopril (PRINIVIL,ZESTRIL) 20 MG tablet Take 1 tablet (20 mg total) by mouth daily.  90 tablet  3  .  LORazepam (ATIVAN) 0.5 MG tablet TAKE ONE TABLET BY MOUTH EVERY 8 HOURS FOR ANXIETY  30 tablet  0  . Phenylephrine-Cocoa Butter (PREPARATION H) 0.25-88.44 % SUPP Place rectally as needed.      . pravastatin (PRAVACHOL) 40 MG tablet Take 1 tablet (40 mg total) by mouth 2 (two) times daily.  180 tablet  3   No current facility-administered medications for this visit.    History   Social History Narrative    She is a widowed mother of 7, grandmother of 5, great-grandmother of 6,   She does not drink alcohol or smoke cigarettes.   She previously tried exercise routinely but is not exercising as much lately. Her excuse is do to knee pain and back pain as well as cold weather.   ROS: A comprehensive Review of Systems - Negative except Notable musculoskeletal pains.  PHYSICAL EXAM BP 120/80  Pulse 62  Ht 5' (1.524 m)  Wt 253 lb 14.4 oz (115.168 kg)  BMI 49.59 kg/m2 General appearance: alert, cooperative, appears stated age, no distress, morbidly obese and Answers questions appropriately. Pleasant mood with normal affect. Neck: no adenopathy, no carotid bruit, no JVD, supple, symmetrical, trachea midline and thyroid not enlarged, symmetric, no tenderness/mass/nodules Lungs: clear to auscultation bilaterally, normal percussion bilaterally and Nonlabored, good air movement Heart: RRR, normal S1/S2. No R./G. with soft 1/6 C-D SEM at RUSB; nondisplaced PMI Abdomen: soft, non-tender; bowel sounds normal; no masses,  no organomegaly and Morbidly obese; really unable to palpate at this am due to body habitus Extremities: Swollen knees bilaterally with soft puffy obese legs that do not appear to have pitting edema. Pulses: 2+ and symmetric Palpable 1-2+ bilateral lower extremity Neurologic: Alert and oriented X 3, normal strength and tone. Normal symmetric reflexes.    Adult ECG Report  Rate:62 ;  Rhythm: normal sinus rhythm and sinus arrhythmia  QRS Axis, PR Interval, QRS Duration, QTc: normal    Voltages:  Normal  Conduction Disturbances: none  Other Abnormalities: Nonspecific ST and T wave abnormalities. Cannot exclude anterior ischemia. No change since April 2014.   Nar rative Interpretation: stable ECG last labs were checked  Recent Labs:  07/06/2013; reviewed in Epic  TC 194, TG 71, HDL 61, LDL 119  ASSESSMENT / PLAN: Hypertension Adequately controlled on current medications  Morbid obesity with BMI of 50.0-59.9, adult The major issue for her. Her weight is at least stable. She jokingly talks about losing weight and trying to diet but I don't see her since she takes this. Unfortunately because of her weight she has significant arthritis pains making it very difficult for her to do exercise and lose weight. I think she should be referred for nutrition consultation, but she says  she is seeing a nutritionist through church. Her main issue is that her son has been cooking for her, hopefully with his new diagnosis of diabetes, the eating habits we'll change.  Metabolic syndrome Significant cardiac risk factor. Need to continue to monitor blood pressure, cholesterol and work on weight loss.  Dyslipidemia On statin. Monitored by PCP. LDL is still relatively well controlled at goal for her based on her cardiac risk.  Bilateral edema of lower extremity Relatively stable on current dose of Lasix. He does not seem to be pitting edema and is probably not related to cardiac issues.   Over 25 minutes of face-to-face time was spent. At least 15 minutes was on direct counseling as far as dietary modification importance of exercise and different options.  Orders Placed This Encounter  Procedures  . EKG 12-Lead   Meds ordered this encounter  Medications  . Phenylephrine-Cocoa Butter (PREPARATION H) 0.25-88.44 % SUPP    Sig: Place rectally as needed.    Followup:  one year  Wandy Bossler W. Herbie BaltimoreHARDING, M.D., M.S. Interventional Cardiology [facility]

## 2013-07-18 NOTE — Assessment & Plan Note (Signed)
The major issue for her. Her weight is at least stable. She jokingly talks about losing weight and trying to diet but I don't see her since she takes this. Unfortunately because of her weight she has significant arthritis pains making it very difficult for her to do exercise and lose weight. I think she should be referred for nutrition consultation, but she says she is seeing a nutritionist through church. Her main issue is that her son has been cooking for her, hopefully with his new diagnosis of diabetes, the eating habits we'll change.

## 2013-07-18 NOTE — Assessment & Plan Note (Signed)
Relatively stable on current dose of Lasix. He does not seem to be pitting edema and is probably not related to cardiac issues.

## 2013-07-18 NOTE — Assessment & Plan Note (Signed)
Significant cardiac risk factor. Need to continue to monitor blood pressure, cholesterol and work on weight loss.

## 2013-07-18 NOTE — Assessment & Plan Note (Signed)
On statin. Monitored by PCP. LDL is still relatively well controlled at goal for her based on her cardiac risk.

## 2013-07-18 NOTE — Assessment & Plan Note (Signed)
Adequately controlled on current medications 

## 2013-08-29 DIAGNOSIS — M25559 Pain in unspecified hip: Secondary | ICD-10-CM | POA: Diagnosis not present

## 2013-08-29 DIAGNOSIS — M25569 Pain in unspecified knee: Secondary | ICD-10-CM | POA: Diagnosis not present

## 2013-08-29 DIAGNOSIS — M1710 Unilateral primary osteoarthritis, unspecified knee: Secondary | ICD-10-CM | POA: Diagnosis not present

## 2013-08-29 DIAGNOSIS — M76899 Other specified enthesopathies of unspecified lower limb, excluding foot: Secondary | ICD-10-CM | POA: Diagnosis not present

## 2013-10-06 IMAGING — MG MM DIGITAL SCREENING BILAT
3 series · 3 of 3 positions shown · non-contrast
Comparison: Previous exam(s).

CLINICAL DATA: Screening.

DIGITAL SCREENING BILATERAL MAMMOGRAM WITH CAD

[R CC]
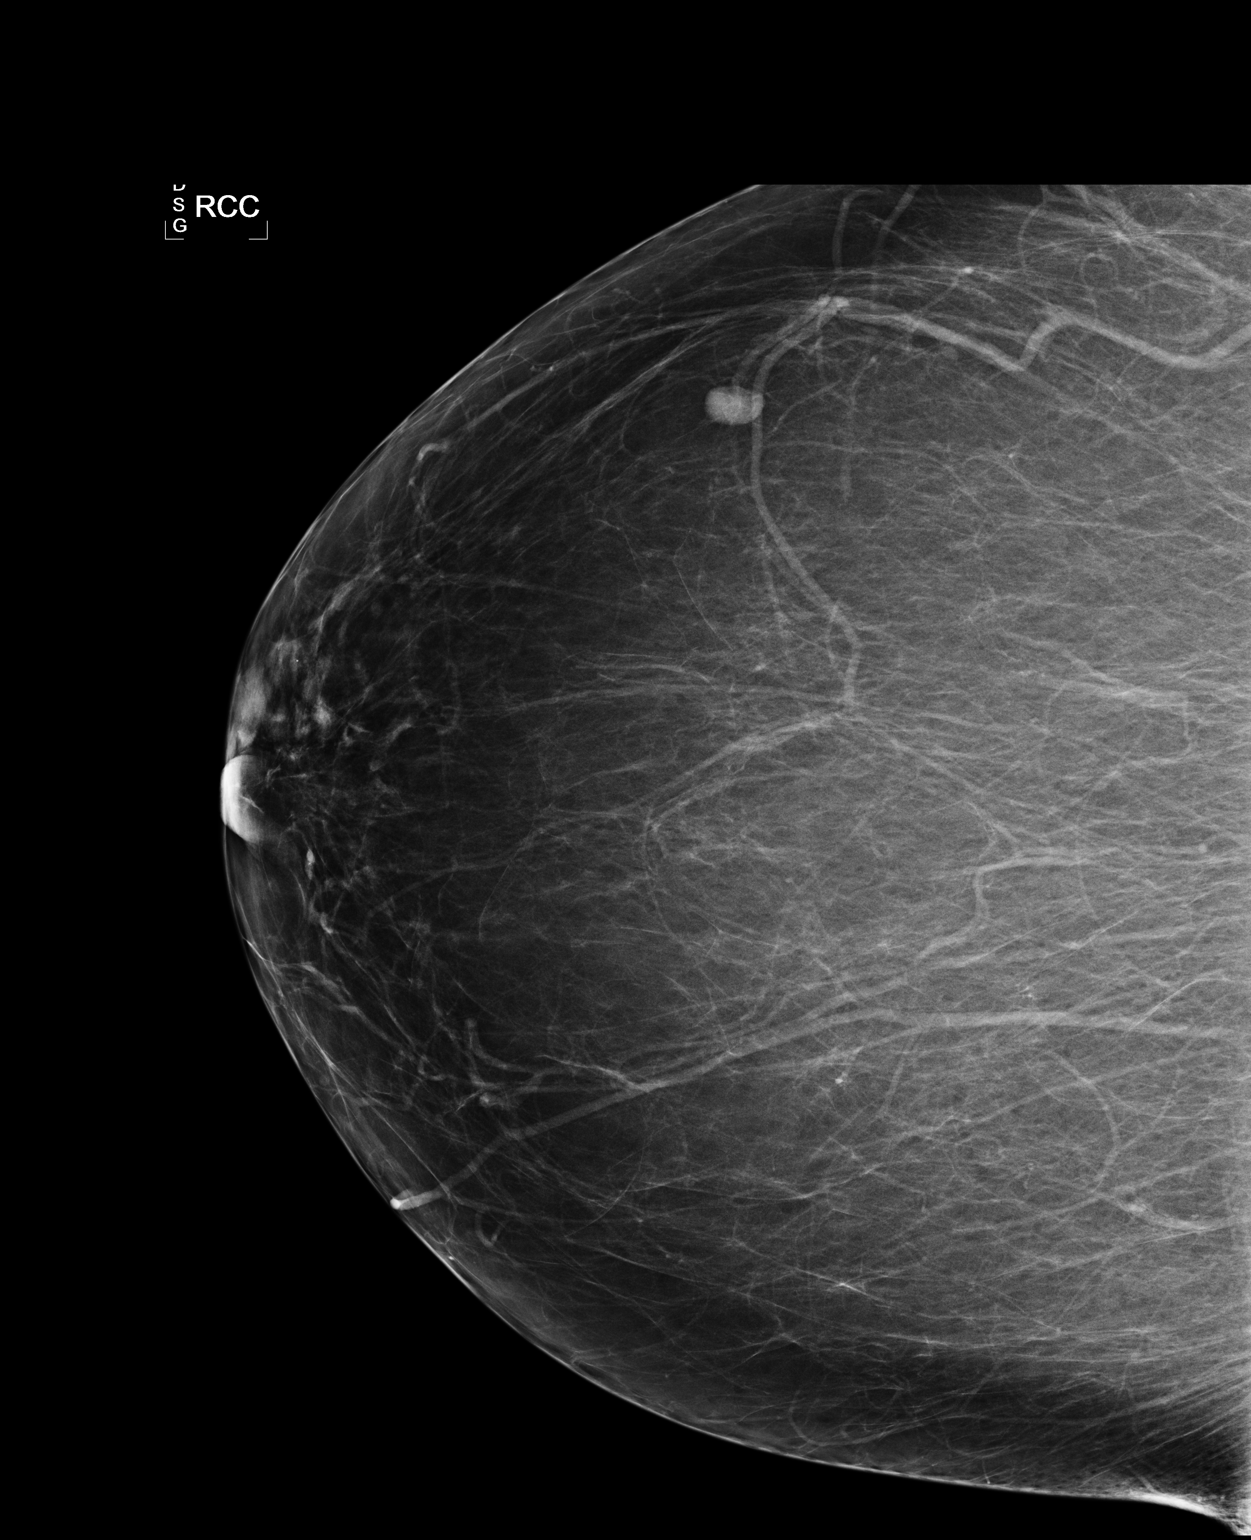

[R MLO]
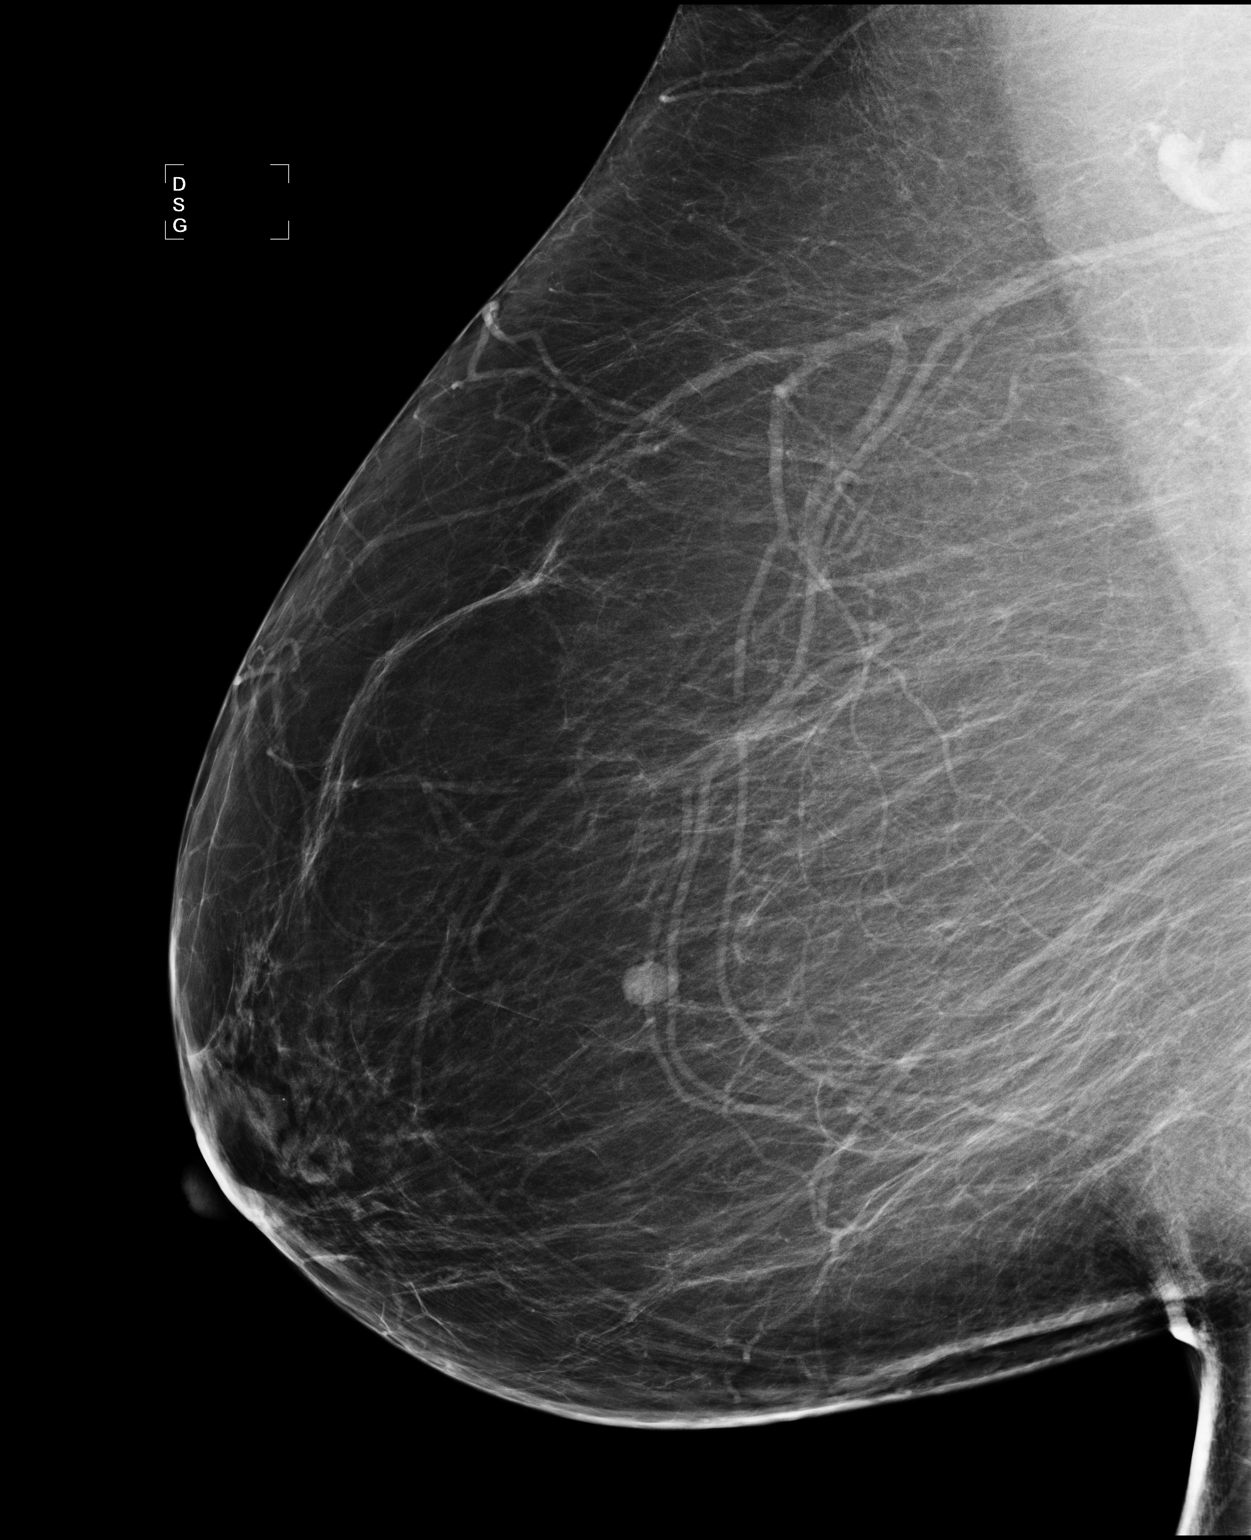

[L CC]
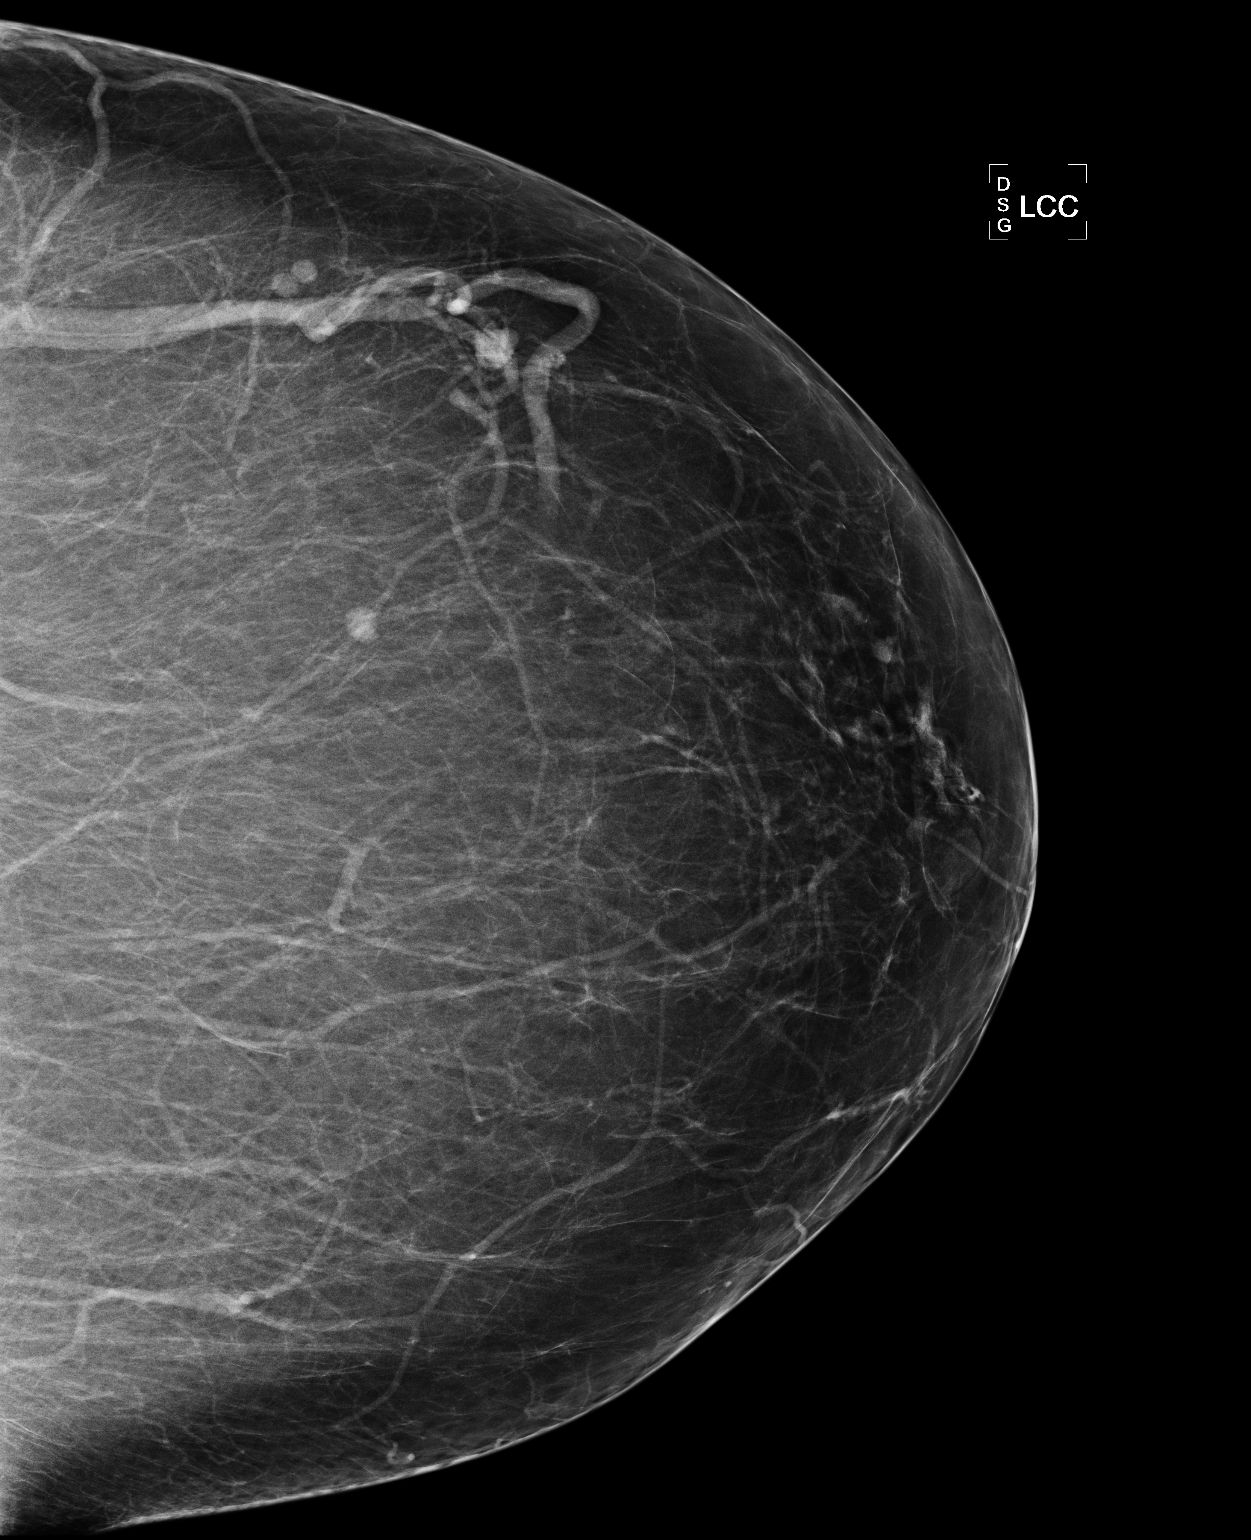

[3 of 3 positions shown; findings below may reference images not displayed]

FINDINGS: ACR Breast Density Category b:  There are scattered areas of
fibroglandular density.

There are no findings suspicious for malignancy.

Images were processed with CAD.
IMPRESSION: No mammographic evidence of malignancy.

A result letter of this screening mammogram will be mailed directly
to the patient.

RECOMMENDATION:
Screening mammogram in one year. (Code:HJ-L-MM5)

BI-RADS CATEGORY 2:  Benign finding(s).

## 2013-10-10 ENCOUNTER — Other Ambulatory Visit: Payer: Self-pay | Admitting: Emergency Medicine

## 2013-10-10 DIAGNOSIS — Z1231 Encounter for screening mammogram for malignant neoplasm of breast: Secondary | ICD-10-CM

## 2013-10-20 ENCOUNTER — Ambulatory Visit (HOSPITAL_COMMUNITY)
Admission: RE | Admit: 2013-10-20 | Discharge: 2013-10-20 | Disposition: A | Discharge status: Home / Self Care | Payer: MEDICARE | Source: Ambulatory Visit | Attending: Emergency Medicine | Admitting: Emergency Medicine

## 2013-10-20 DIAGNOSIS — Z1231 Encounter for screening mammogram for malignant neoplasm of breast: Secondary | ICD-10-CM | POA: Diagnosis not present

## 2013-11-13 ENCOUNTER — Other Ambulatory Visit: Payer: Self-pay | Admitting: Emergency Medicine

## 2013-11-14 ENCOUNTER — Other Ambulatory Visit: Payer: Self-pay | Admitting: Radiology

## 2013-12-01 ENCOUNTER — Other Ambulatory Visit: Payer: Self-pay | Admitting: Emergency Medicine

## 2013-12-13 ENCOUNTER — Other Ambulatory Visit: Payer: Self-pay | Admitting: Emergency Medicine

## 2013-12-13 NOTE — Telephone Encounter (Signed)
Called in.

## 2014-01-03 ENCOUNTER — Other Ambulatory Visit: Payer: Self-pay | Admitting: Emergency Medicine

## 2014-01-04 NOTE — Telephone Encounter (Signed)
Spoke to pt advised she needs OV- states she will be in on Monday to see Dr. Cleta Alberts.

## 2014-01-11 ENCOUNTER — Ambulatory Visit (INDEPENDENT_AMBULATORY_CARE_PROVIDER_SITE_OTHER): Payer: MEDICARE | Admitting: Emergency Medicine

## 2014-01-11 VITALS — BP 116/72 | HR 80 | Temp 97.7°F | Resp 18 | Ht 60.0 in | Wt 254.0 lb

## 2014-01-11 DIAGNOSIS — E785 Hyperlipidemia, unspecified: Secondary | ICD-10-CM

## 2014-01-11 DIAGNOSIS — I10 Essential (primary) hypertension: Secondary | ICD-10-CM | POA: Diagnosis not present

## 2014-01-11 LAB — POCT URINALYSIS DIPSTICK
Bilirubin, UA: NEGATIVE
Glucose, UA: NEGATIVE
Ketones, UA: NEGATIVE
Nitrite, UA: NEGATIVE
SPEC GRAV UA: 1.02
UROBILINOGEN UA: 0.2
pH, UA: 8.5

## 2014-01-11 LAB — COMPREHENSIVE METABOLIC PANEL
ALT: 11 U/L (ref 0–35)
AST: 20 U/L (ref 0–37)
Albumin: 4.6 g/dL (ref 3.5–5.2)
Alkaline Phosphatase: 86 U/L (ref 39–117)
BUN: 12 mg/dL (ref 6–23)
CO2: 29 meq/L (ref 19–32)
CREATININE: 0.7 mg/dL (ref 0.50–1.10)
Calcium: 10.5 mg/dL (ref 8.4–10.5)
Chloride: 102 mEq/L (ref 96–112)
Glucose, Bld: 105 mg/dL — ABNORMAL HIGH (ref 70–99)
Potassium: 4.8 mEq/L (ref 3.5–5.3)
Sodium: 140 mEq/L (ref 135–145)
Total Bilirubin: 0.5 mg/dL (ref 0.2–1.2)
Total Protein: 7.9 g/dL (ref 6.0–8.3)

## 2014-01-11 LAB — POCT UA - MICROSCOPIC ONLY
CRYSTALS, UR, HPF, POC: NEGATIVE
Casts, Ur, LPF, POC: NEGATIVE
Mucus, UA: NEGATIVE
RBC, urine, microscopic: NEGATIVE
Yeast, UA: NEGATIVE

## 2014-01-11 LAB — POCT CBC
Granulocyte percent: 67 %G (ref 37–80)
HCT, POC: 41.4 % (ref 37.7–47.9)
HEMOGLOBIN: 13.1 g/dL (ref 12.2–16.2)
Lymph, poc: 2.1 (ref 0.6–3.4)
MCH: 29.6 pg (ref 27–31.2)
MCHC: 31.6 g/dL — AB (ref 31.8–35.4)
MCV: 93.6 fL (ref 80–97)
MID (cbc): 0.2 (ref 0–0.9)
MPV: 7.8 fL (ref 0–99.8)
POC Granulocyte: 4.7 (ref 2–6.9)
POC LYMPH %: 30.1 % (ref 10–50)
POC MID %: 2.9 %M (ref 0–12)
Platelet Count, POC: 283 10*3/uL (ref 142–424)
RBC: 4.42 M/uL (ref 4.04–5.48)
RDW, POC: 17 %
WBC: 7 10*3/uL (ref 4.6–10.2)

## 2014-01-11 LAB — LIPID PANEL
CHOL/HDL RATIO: 2.9 ratio
CHOLESTEROL: 211 mg/dL — AB (ref 0–200)
HDL: 74 mg/dL (ref >39)
LDL Cholesterol: 124 mg/dL — ABNORMAL HIGH (ref 0–99)
TRIGLYCERIDES: 64 mg/dL (ref <150)
VLDL: 13 mg/dL (ref 0–40)

## 2014-01-11 MED ORDER — FUROSEMIDE 20 MG PO TABS: 20.0000 mg | ORAL_TABLET | Freq: Every day | ORAL | Status: DC

## 2014-01-11 MED ORDER — LISINOPRIL 20 MG PO TABS: 20.0000 mg | ORAL_TABLET | Freq: Every day | ORAL | Status: DC

## 2014-01-11 MED ORDER — LORAZEPAM 0.5 MG PO TABS: ORAL_TABLET | ORAL | Status: DC

## 2014-01-11 MED ORDER — PRAVASTATIN SODIUM 40 MG PO TABS: 40.0000 mg | ORAL_TABLET | Freq: Two times a day (BID) | ORAL | Status: DC

## 2014-01-11 NOTE — Patient Instructions (Signed)

## 2014-01-11 NOTE — Progress Notes (Signed)
[facility] [address], [place] [address] [phone]  Date:  01/11/2014   Name:  [name]   DOB:  06-Oct-1940   MRN:  [id number]  PCP:  Lucilla Edin, MD    Chief Complaint: rx refills   History of Present Illness:  [name] is a 73 y.o. very pleasant female patient who presents with the following:  For follow up on metabolic syndrome.  Tolerating medications well.  No adverse effects.  Pending multiple orthopedics procedures  Denies other complaint or health concern today.   Patient Active Problem List   Diagnosis Date Noted  . Morbid obesity with BMI of 50.0-59.9, adult 07/18/2013  . Dyslipidemia 07/18/2013  . Bilateral edema of lower extremity 07/18/2013    Class: Chronic  . Metabolic syndrome 07/18/2013  . Rectal bleeding 07/07/2012  . Hypertension 07/07/2012  . GERD (gastroesophageal reflux disease) 07/07/2012  . Hyperlipidemia 07/07/2012  . Rotator cuff tear, left 03/02/2012  . Osteoarthritis of left shoulder 01/21/2012    Past Medical History  Diagnosis Date  . Anemia     on iron  . Hypertension   . Hyperlipidemia     on med  . Anxiety   . Arthritis     shoulders, knees, hands, hips; status post left knee surgery  . Depression   . H/O: pneumonia 2003  . Hyperlipemia   . Constipation   . GERD (gastroesophageal reflux disease)     TAKES TUMS  . Morbid obesity with BMI of 45.0-49.9, adult     Past Surgical History  Procedure Laterality Date  . Wisdom tooth extraction    . Svd      x 7  . Tubal ligation    . Knee surgery Left 11/2001  . Replacement total knee Left 10/2009  . Carpal tunnel release Left   . Colonoscopy    . Replacement total knee Left 10/2009  . Total shoulder replacement Left 01/19/2012  . Total shoulder arthroplasty  01/18/2012    Procedure: TOTAL SHOULDER ARTHROPLASTY;  Surgeon: Mable Paris, MD;  Location: [facility] OR;  Service: Orthopedics;  Laterality: Left;  . Shoulder open rotator  cuff repair  03/01/2012    Procedure: ROTATOR CUFF REPAIR SHOULDER OPEN;  Surgeon: Mable Paris, MD;  Location: [facility] OR;  Service: Orthopedics;  Laterality: Left;  SUBSCAPULARIS REPAIR  . Transthoracic echocardiogram  06/2011    LV mildly dilated, EF=>55%; mild mitral annular calcif, mild MR; mild TR; AV mildly sclerotic, mild aortic regurg  . Stress test - adenosine thallium  1997    apical and inferior segments with questionable infarct and ischemia in anteroseptal & inferoseptal segments (Dr. Standley Brooking)  . Cardiac catheterization  04/20/1996    patent coronaries, EF 74% (Dr. Aram Candela)    History  Substance Use Topics  . Smoking status: Former Smoker -- 1.00 packs/day for 25 years    Types: Cigarettes    Quit date: 03/07/1984  . Smokeless tobacco: Former Neurosurgeon  . Alcohol Use: No    Family History  Problem Relation Age of Onset  . Heart disease Mother   . Heart disease Father   . Heart disease Sister   . Hypertension Sister   . Heart disease Sister   . Hypertension Sister   . Heart disease Sister   . Hypertension Sister     Allergies  Allergen Reactions  . Effexor [Venlafaxine Hcl]     Lip swelling patient advised to d/c  .  Lisinopril     Lip swelling patient advised to d/c  . Sulfa Drugs Cross Reactors Itching    Medication list has been reviewed and updated.  Current Outpatient Prescriptions on File Prior to Visit  Medication Sig Dispense Refill  . aspirin EC 81 MG tablet Take 81 mg by mouth daily.      Marland Kitchen. docusate sodium 100 MG CAPS Take 100 mg by mouth 2 (two) times daily as needed for constipation.  20 capsule  0  . furosemide (LASIX) 20 MG tablet Take 1 tablet (20 mg total) by mouth daily.  90 tablet  3  . ibuprofen (ADVIL,MOTRIN) 200 MG tablet Take 200 mg by mouth every 8 (eight) hours as needed for pain.      . IRON PO Take 55 mg by mouth daily with breakfast. Per pt report      . lisinopril (PRINIVIL,ZESTRIL) 20 MG tablet TAKE 1 TABLET BY MOUTH DAILY.  **NEED TO SCHEDULE APPOINTMENT PRIOR TO MORE REFILLS**  30 tablet  0  . LORazepam (ATIVAN) 0.5 MG tablet TAKE ONE TABLET BY MOUTH EVERY 8 HOURS FOR ANXIETY  30 tablet  0  . Phenylephrine-Cocoa Butter (PREPARATION H) 0.25-88.44 % SUPP Place rectally as needed.      . pravastatin (PRAVACHOL) 40 MG tablet Take 1 tablet (40 mg total) by mouth 2 (two) times daily. PATIENT NEEDS OFFICE VISIT FOR ADDITIONAL REFILLS  60 tablet  0   No current facility-administered medications on file prior to visit.    Review of Systems:  As per HPI, otherwise negative.    Physical Examination: Filed Vitals:   01/11/14 0815  BP: 116/72  Pulse: 80  Temp: 97.7 F (36.5 C)  Resp: 18   Filed Vitals:   01/11/14 0815  Height: 5' (1.524 m)  Weight: 254 lb (115.214 kg)   Body mass index is 49.61 kg/(m^2). Ideal Body Weight: Weight in (lb) to have BMI = 25: 127.7  GEN: morbid obesity, NAD, Non-toxic, A & O x 3 HEENT: Atraumatic, Normocephalic. Neck supple. No masses, No LAD. Ears and Nose: No external deformity. CV: RRR, No M/G/R. No JVD. No thrill. No extra heart sounds. PULM: CTA B, no wheezes, crackles, rhonchi. No retractions. No resp. distress. No accessory muscle use. ABD: S, NT, ND, +BS. No rebound. No HSM. EXTR: No c/c/e NEURO Normal gait.  PSYCH: Normally interactive. Conversant. Not depressed or anxious appearing.  Calm demeanor.    Assessment and Plan: Hypertension NIDDM HLD Labs pending  Signed,  Phillips OdorJeffery Trystan Akhtar, MD

## 2014-01-11 NOTE — Addendum Note (Signed)
Addended by: Tawnya CrookSAMBATH, Quinlee Sciarra on: 01/11/2014 09:45 AM   Modules accepted: Orders

## 2014-01-11 NOTE — Addendum Note (Signed)
Addended by: Carmelina DaneANDERSON, Feliz Herard S on: 01/11/2014 08:56 AM   Modules accepted: Orders

## 2014-01-12 LAB — URINE CULTURE: Colony Count: 30000

## 2014-02-04 ENCOUNTER — Other Ambulatory Visit: Payer: Self-pay | Admitting: Physician Assistant

## 2014-02-06 DIAGNOSIS — M7061 Trochanteric bursitis, right hip: Secondary | ICD-10-CM | POA: Diagnosis not present

## 2014-02-06 DIAGNOSIS — M1711 Unilateral primary osteoarthritis, right knee: Secondary | ICD-10-CM | POA: Diagnosis not present

## 2014-02-12 ENCOUNTER — Other Ambulatory Visit: Payer: Self-pay | Admitting: Emergency Medicine

## 2014-03-29 ENCOUNTER — Telehealth: Payer: Self-pay

## 2014-03-29 NOTE — Telephone Encounter (Signed)
Tried to call patient to remind about flu shot, however, phone was disconnected.

## 2014-04-01 ENCOUNTER — Other Ambulatory Visit: Payer: Self-pay | Admitting: Emergency Medicine

## 2014-04-04 ENCOUNTER — Encounter: Payer: Self-pay | Admitting: Cardiology

## 2014-04-19 DIAGNOSIS — M545 Low back pain: Secondary | ICD-10-CM | POA: Diagnosis not present

## 2014-04-19 DIAGNOSIS — M129 Arthropathy, unspecified: Secondary | ICD-10-CM | POA: Diagnosis not present

## 2014-04-19 DIAGNOSIS — M160 Bilateral primary osteoarthritis of hip: Secondary | ICD-10-CM | POA: Diagnosis not present

## 2014-05-24 DIAGNOSIS — M545 Low back pain: Secondary | ICD-10-CM | POA: Diagnosis not present

## 2014-05-24 DIAGNOSIS — M25551 Pain in right hip: Secondary | ICD-10-CM | POA: Diagnosis not present

## 2014-05-24 DIAGNOSIS — M25552 Pain in left hip: Secondary | ICD-10-CM | POA: Diagnosis not present

## 2014-06-03 ENCOUNTER — Emergency Department (HOSPITAL_COMMUNITY)
Admission: EM | Admit: 2014-06-03 | Discharge: 2014-06-03 | Disposition: A | Discharge status: Home / Self Care | Payer: MEDICARE | Attending: Emergency Medicine | Admitting: Emergency Medicine

## 2014-06-03 ENCOUNTER — Encounter (HOSPITAL_COMMUNITY): Payer: Self-pay | Admitting: Emergency Medicine

## 2014-06-03 DIAGNOSIS — I10 Essential (primary) hypertension: Secondary | ICD-10-CM | POA: Insufficient documentation

## 2014-06-03 DIAGNOSIS — E785 Hyperlipidemia, unspecified: Secondary | ICD-10-CM | POA: Diagnosis not present

## 2014-06-03 DIAGNOSIS — Z79899 Other long term (current) drug therapy: Secondary | ICD-10-CM | POA: Diagnosis not present

## 2014-06-03 DIAGNOSIS — F419 Anxiety disorder, unspecified: Secondary | ICD-10-CM | POA: Diagnosis not present

## 2014-06-03 DIAGNOSIS — R011 Cardiac murmur, unspecified: Secondary | ICD-10-CM | POA: Insufficient documentation

## 2014-06-03 DIAGNOSIS — Z8701 Personal history of pneumonia (recurrent): Secondary | ICD-10-CM | POA: Diagnosis not present

## 2014-06-03 DIAGNOSIS — Z87891 Personal history of nicotine dependence: Secondary | ICD-10-CM | POA: Insufficient documentation

## 2014-06-03 DIAGNOSIS — Z8719 Personal history of other diseases of the digestive system: Secondary | ICD-10-CM | POA: Insufficient documentation

## 2014-06-03 DIAGNOSIS — Z7982 Long term (current) use of aspirin: Secondary | ICD-10-CM | POA: Insufficient documentation

## 2014-06-03 DIAGNOSIS — D649 Anemia, unspecified: Secondary | ICD-10-CM | POA: Diagnosis not present

## 2014-06-03 DIAGNOSIS — M1990 Unspecified osteoarthritis, unspecified site: Secondary | ICD-10-CM | POA: Diagnosis not present

## 2014-06-03 DIAGNOSIS — R0602 Shortness of breath: Secondary | ICD-10-CM | POA: Insufficient documentation

## 2014-06-03 LAB — BASIC METABOLIC PANEL
ANION GAP: 4 — AB (ref 5–15)
BUN: 15 mg/dL (ref 6–23)
CALCIUM: 10.4 mg/dL (ref 8.4–10.5)
CHLORIDE: 106 mmol/L (ref 96–112)
CO2: 34 mmol/L — ABNORMAL HIGH (ref 19–32)
Creatinine, Ser: 0.85 mg/dL (ref 0.50–1.10)
GFR calc Af Amer: 78 mL/min — ABNORMAL LOW (ref >90)
GFR calc non Af Amer: 67 mL/min — ABNORMAL LOW (ref >90)
Glucose, Bld: 99 mg/dL (ref 70–99)
POTASSIUM: 4.7 mmol/L (ref 3.5–5.1)
Sodium: 144 mmol/L (ref 135–145)

## 2014-06-03 LAB — CBC
HCT: 38.3 % (ref 36.0–46.0)
HEMOGLOBIN: 12.1 g/dL (ref 12.0–15.0)
MCH: 29.6 pg (ref 26.0–34.0)
MCHC: 31.6 g/dL (ref 30.0–36.0)
MCV: 93.6 fL (ref 78.0–100.0)
Platelets: 289 10*3/uL (ref 150–400)
RBC: 4.09 MIL/uL (ref 3.87–5.11)
RDW: 15 % (ref 11.5–15.5)
WBC: 8.8 10*3/uL (ref 4.0–10.5)

## 2014-06-03 LAB — I-STAT TROPONIN, ED: TROPONIN I, POC: 0 ng/mL (ref 0.00–0.08)

## 2014-06-03 NOTE — ED Notes (Signed)
At church talking to a friend, she reports she got really excited (happy) and began to hyperventilate and couldn't catch her breath. This lasted about a minute. All symptoms resolved at this time. Denies SOB, denies pain. A&O x4.

## 2014-06-03 NOTE — Discharge Instructions (Signed)
Shortness of Breath °Shortness of breath means you have trouble breathing. Shortness of breath needs medical care right away. °HOME CARE  °· Do not smoke. °· Avoid being around chemicals or things (paint fumes, dust) that may bother your breathing. °· Rest as needed. Slowly begin your normal activities. °· Only take medicines as told by your doctor. °· Keep all doctor visits as told. °GET HELP RIGHT AWAY IF:  °· Your shortness of breath gets worse. °· You feel lightheaded, pass out (faint), or have a cough that is not helped by medicine. °· You cough up blood. °· You have pain with breathing. °· You have pain in your chest, arms, shoulders, or belly (abdomen). °· You have a fever. °· You cannot walk up stairs or exercise the way you normally do. °· You do not get better in the time expected. °· You have a hard time doing normal activities even with rest. °· You have problems with your medicines. °· You have any new symptoms. °MAKE SURE YOU: °· Understand these instructions. °· Will watch your condition. °· Will get help right away if you are not doing well or get worse. °Document Released: 09/16/2007 Document Revised: 04/04/2013 Document Reviewed: 06/15/2011 °[employer]® Patient Information ©2015 [employer]. This information is not intended to replace advice given to you by your health care provider. Make sure you discuss any questions you have with your health care provider. ° °

## 2014-06-03 NOTE — ED Provider Notes (Signed)
CSN: [id number]     Arrival date & time 06/03/14  1035 History   First MD Initiated Contact with Patient 06/03/14 1043     Chief Complaint  Patient presents with  . Shortness of Breath     (Consider location/radiation/quality/duration/timing/severity/associated sxs/prior Treatment) HPI [name] is a 74 y.o. female with a history of morbid obesity, hypertension, hyperlipidemia, anxiety comes in for evaluation of acute shortness of breath. Patient states approximately 10:00 she was at church speaking to a friend about different people they both knew when "she got excited and then couldn't catch my breath". She states this episode lasted for approximately 1 minute. She reports a "very mild" central chest discomfort. She cannot characterize the discomfort, but reports it does not radiate. Per family members in the room they report that patient became diaphoretic, patient does not recall. She denies headache, vision changes, numbness or weakness, dizziness, cough, nausea or vomiting, abdominal pain, syncope.  Past Medical History  Diagnosis Date  . Anemia     on iron  . Hypertension   . Hyperlipidemia     on med  . Anxiety   . Arthritis     shoulders, knees, hands, hips; status post left knee surgery  . Depression   . H/O: pneumonia 2003  . Hyperlipemia   . Constipation   . GERD (gastroesophageal reflux disease)     TAKES TUMS  . Morbid obesity with BMI of 45.0-49.9, adult    Past Surgical History  Procedure Laterality Date  . Wisdom tooth extraction    . Svd      x 7  . Tubal ligation    . Knee surgery Left 11/2001  . Replacement total knee Left 10/2009  . Carpal tunnel release Left   . Colonoscopy    . Replacement total knee Left 10/2009  . Total shoulder replacement Left 01/19/2012  . Total shoulder arthroplasty  01/18/2012    Procedure: TOTAL SHOULDER ARTHROPLASTY;  Surgeon: Mable ParisJustin William Chandler, MD;  [facility];  Service: Orthopedics;  Laterality: Left;  .  Shoulder open rotator cuff repair  03/01/2012    Procedure: ROTATOR CUFF REPAIR SHOULDER OPEN;  Surgeon: Mable ParisJustin William Chandler, MD;  [facility];  Service: Orthopedics;  Laterality: Left;  SUBSCAPULARIS REPAIR  . Transthoracic echocardiogram  06/2011    LV mildly dilated, EF=>55%; mild mitral annular calcif, mild MR; mild TR; AV mildly sclerotic, mild aortic regurg  . Stress test - adenosine thallium  1997    apical and inferior segments with questionable infarct and ischemia in anteroseptal & inferoseptal segments (Dr. Standley Brooking. Shepherd)  . Cardiac catheterization  04/20/1996    patent coronaries, EF 74% (Dr. Aram CandelaW. Gamble)   Family History  Problem Relation Age of Onset  . Heart disease Mother   . Heart disease Father   . Heart disease Sister   . Hypertension Sister   . Heart disease Sister   . Hypertension Sister   . Heart disease Sister   . Hypertension Sister    History  Substance Use Topics  . Smoking status: Former Smoker -- 1.00 packs/day for 25 years    Types: Cigarettes    Quit date: 03/07/1984  . Smokeless tobacco: Former NeurosurgeonUser  . Alcohol Use: No   OB History    No data available     Review of Systems A 10 point review of systems was completed and was negative except for pertinent positives and negatives as mentioned in the history of present illness  Allergies  Effexor; Lisinopril; and Sulfa drugs cross reactors  Home Medications   Prior to Admission medications   Medication Sig Start Date End Date Taking? Authorizing Provider  acetaminophen (TYLENOL) 650 MG CR tablet Take 1,300 mg by mouth at bedtime.   Yes Historical Provider, MD  aspirin EC 81 MG tablet Take 81 mg by mouth daily.   Yes Historical Provider, MD  docusate sodium 100 MG CAPS Take 100 mg by mouth 2 (two) times daily as needed for constipation. 07/08/12  Yes Bryan R Hess, DO  furosemide (LASIX) 20 MG tablet Take 1 tablet (20 mg total) by mouth daily. 01/11/14  Yes Carmelina Dane, MD  furosemide  (LASIX) 20 MG tablet TAKE ONE TABLET BY MOUTH ONCE DAILY 04/03/14  Yes Carmelina Dane, MD  IRON PO Take 55 mg by mouth daily with breakfast. Per pt report   Yes Historical Provider, MD  lisinopril (PRINIVIL,ZESTRIL) 20 MG tablet Take 1 tablet (20 mg total) by mouth daily. 02/06/14  Yes Carmelina Dane, MD  LORazepam (ATIVAN) 0.5 MG tablet TAKE ONE TABLET BY MOUTH EVERY 8 HOURS FOR ANXIETY 01/11/14  Yes Carmelina Dane, MD  Phenylephrine-Cocoa Butter (PREPARATION H) 0.25-88.44 % SUPP Place rectally as needed.   Yes Historical Provider, MD  pravastatin (PRAVACHOL) 40 MG tablet Take 1 tablet (40 mg total) by mouth 2 (two) times daily. 02/13/14  Yes Carmelina Dane, MD  pravastatin (PRAVACHOL) 40 MG tablet Take 1 tablet (40 mg total) by mouth 2 (two) times daily. PATIENT NEEDS OFFICE VISIT FOR ADDITIONAL REFILLS Patient not taking: Reported on 06/03/2014 01/11/14   Carmelina Dane, MD   BP 100/66 mmHg  Pulse 71  Temp(Src) 98.2 F (36.8 C) (Oral)  Resp 17  Ht 5' (1.524 m)  Wt 240 lb (108.863 kg)  BMI 46.87 kg/m2  SpO2 98% Physical Exam  Constitutional: She is oriented to person, place, and time. She appears well-developed and well-nourished.  Morbidly obese. Very cheerful. GCS 15.  HENT:  Head: Normocephalic and atraumatic.  Mouth/Throat: Oropharynx is clear and moist.  Eyes: Conjunctivae are normal. Pupils are equal, round, and reactive to light. Right eye exhibits no discharge. Left eye exhibits no discharge. No scleral icterus.  Neck: Normal range of motion. Neck supple.  Cardiovascular: Normal rate and regular rhythm.   2/6 systolic ejection murmur.  Pulmonary/Chest: Effort normal and breath sounds normal. No respiratory distress. She has no wheezes. She has no rales.  Abdominal: Soft. She exhibits no distension. There is no tenderness.  Musculoskeletal: Normal range of motion. She exhibits no tenderness.  Neurological: She is alert and oriented to person, place, and time.   Cranial Nerves II-XII grossly intact  Skin: Skin is warm and dry. No rash noted.  Psychiatric: She has a normal mood and affect.  Nursing note and vitals reviewed.   ED Course  Procedures (including critical care time) Labs Review Labs Reviewed  BASIC METABOLIC PANEL - Abnormal; Notable for the following:    CO2 34 (*)    GFR calc non Af Amer 67 (*)    GFR calc Af Amer 78 (*)    Anion gap 4 (*)    All other components within normal limits  CBC  I-STAT TROPOININ, ED    Imaging Review No results found.   EKG Interpretation   Date/Time:  Sunday June 03 2014 11:32:35 EST Ventricular Rate:  66 PR Interval:  166 QRS Duration: 97 QT Interval:  424 QTC Calculation: 444 R Axis:  36 Text Interpretation:  Sinus rhythm Abnormal R-wave progression, early  transition Nonspecific T abnormalities, anterior leads No significant  change was found Confirmed by CAMPOS  MD, KEVIN ([id number]) on 06/03/2014  12:31:12 PM     Meds given in ED:  Medications - No data to display  Discharge Medication List as of 06/03/2014 12:52 PM     Filed Vitals:   06/03/14 1047 06/03/14 1130 06/03/14 1200 06/03/14 1300  BP: 123/105 122/84 100/44 100/66  Pulse: 74 65 81 71  Temp: 98.2 F (36.8 C)     TempSrc: Oral     Resp: Height: 5' (1.524 m)     Weight: 240 lb (108.863 kg)     SpO2: 97% 98% 97% 98%    MDM  Vitals stable - WNL -afebrile, >96% on RA Pt resting comfortably in ED. PE--not concerning for other acute or emergent pathology. Normal abdominal and neuro exams. Labwork--noncontributory, troponin negative, EKG reassuring.  Clinical picture not consistent with ACS.  Doubt Dissection, PE, PTX, Esophageal rupture. Pt states she feels fine now and is ready to go home. I do not fel pt experienced or is experiencing any emergent process that requires immediate intervention at this time. I discussed all relevant lab findings and imaging results with pt and they verbalized  understanding. Discussed f/u with PCP within 48 hrs and return precautions, pt very amenable to plan.  Prior to patient discharge, I discussed and reviewed this case with Dr.Campos, who also saw and evaluated the pt   Final diagnoses:  SOB (shortness of breath)       Sharlene Motts, PA-C 06/04/14 6045  Lyanne Co, MD 06/07/14 [phone]

## 2014-06-21 DIAGNOSIS — M129 Arthropathy, unspecified: Secondary | ICD-10-CM | POA: Diagnosis not present

## 2014-06-21 DIAGNOSIS — M160 Bilateral primary osteoarthritis of hip: Secondary | ICD-10-CM | POA: Diagnosis not present

## 2014-06-21 DIAGNOSIS — M5440 Lumbago with sciatica, unspecified side: Secondary | ICD-10-CM | POA: Diagnosis not present

## 2014-06-22 ENCOUNTER — Other Ambulatory Visit: Payer: Self-pay | Admitting: Orthopedic Surgery

## 2014-06-22 DIAGNOSIS — M5440 Lumbago with sciatica, unspecified side: Principal | ICD-10-CM

## 2014-06-22 DIAGNOSIS — M545 Low back pain, unspecified: Secondary | ICD-10-CM

## 2014-07-10 ENCOUNTER — Other Ambulatory Visit: Payer: Self-pay | Admitting: Emergency Medicine

## 2014-07-10 DIAGNOSIS — M7062 Trochanteric bursitis, left hip: Secondary | ICD-10-CM | POA: Diagnosis not present

## 2014-07-10 DIAGNOSIS — M5440 Lumbago with sciatica, unspecified side: Secondary | ICD-10-CM | POA: Diagnosis not present

## 2014-07-10 DIAGNOSIS — M7061 Trochanteric bursitis, right hip: Secondary | ICD-10-CM | POA: Diagnosis not present

## 2014-07-16 ENCOUNTER — Ambulatory Visit
Admission: RE | Admit: 2014-07-16 | Discharge: 2014-07-16 | Disposition: A | Discharge status: Home / Self Care | Payer: MEDICARE | Source: Ambulatory Visit | Attending: Orthopedic Surgery | Admitting: Orthopedic Surgery

## 2014-07-16 DIAGNOSIS — M47816 Spondylosis without myelopathy or radiculopathy, lumbar region: Secondary | ICD-10-CM | POA: Diagnosis not present

## 2014-07-16 DIAGNOSIS — M4316 Spondylolisthesis, lumbar region: Secondary | ICD-10-CM | POA: Diagnosis not present

## 2014-07-16 DIAGNOSIS — M545 Low back pain, unspecified: Secondary | ICD-10-CM

## 2014-07-16 DIAGNOSIS — M5126 Other intervertebral disc displacement, lumbar region: Secondary | ICD-10-CM | POA: Diagnosis not present

## 2014-07-16 DIAGNOSIS — M5440 Lumbago with sciatica, unspecified side: Principal | ICD-10-CM

## 2014-07-16 DIAGNOSIS — M4806 Spinal stenosis, lumbar region: Secondary | ICD-10-CM | POA: Diagnosis not present

## 2014-07-20 ENCOUNTER — Other Ambulatory Visit: Payer: Self-pay | Admitting: Emergency Medicine

## 2014-07-23 ENCOUNTER — Ambulatory Visit (INDEPENDENT_AMBULATORY_CARE_PROVIDER_SITE_OTHER): Payer: MEDICARE | Admitting: Cardiology

## 2014-07-23 VITALS — BP 126/78 | HR 69 | Ht 60.0 in | Wt 237.5 lb

## 2014-07-23 DIAGNOSIS — I10 Essential (primary) hypertension: Secondary | ICD-10-CM

## 2014-07-23 DIAGNOSIS — E8881 Metabolic syndrome: Secondary | ICD-10-CM

## 2014-07-23 DIAGNOSIS — E785 Hyperlipidemia, unspecified: Secondary | ICD-10-CM

## 2014-07-23 DIAGNOSIS — R600 Localized edema: Secondary | ICD-10-CM | POA: Diagnosis not present

## 2014-07-23 DIAGNOSIS — Z01810 Encounter for preprocedural cardiovascular examination: Secondary | ICD-10-CM | POA: Diagnosis not present

## 2014-07-23 NOTE — Patient Instructions (Signed)
NO CHANGE WITH CURRENT MEDICATIONS  YOU HAVE CARDIAC CLEARANCE FOR UPCOMING HIP SURGERY- WILL WAIT FOR SURGEON TO CONTACT OFFICE.   Your physician wants you to follow-up in 6 MONTH DR HARDING -30 MIN APPOINTMENT.  You will receive a reminder letter in the mail two months in advance. If you don't receive a letter, please call our office to schedule the follow-up appointment.

## 2014-07-23 NOTE — Progress Notes (Signed)
PCP: Lucilla EdinAUB, STEVE A, MD  Clinic Note: Chief Complaint  Patient presents with  . Annual Exam    pt denied chest pain and SOB, was in hospital last month for anxiety attack.  . Medical Clearance    POSSIBLE R HIP SURGERY- WILL SEE ORTHO ON THURSDAY    HPI: [name] is a 74 y.o. female with a PMH below who presents today for 6 month visit - HTN & obesity. Has adjusted diet & started exercise- walking - has lost wgt. She has been having lots of pain on involving her right hip. She is hoping to have hip surgery done and is probably here to discuss preoperative risk assessment.  Past Medical History  Diagnosis Date  . Anemia     on iron  . Hypertension   . Hyperlipidemia     on med  . Anxiety   . Arthritis     shoulders, knees, hands, hips; status post left knee surgery  . Depression   . H/O: pneumonia 2003  . Constipation   . GERD (gastroesophageal reflux disease)     TAKES TUMS  . Morbid obesity with BMI of 45.0-49.9, adult     Prior Cardiac Evaluation and Past Surgical History: Past Surgical History  Procedure Laterality Date  . Wisdom tooth extraction    . Svd      x 7  . Tubal ligation    . Knee surgery Left 11/2001  . Replacement total knee Left 10/2009  . Carpal tunnel release Left   . Colonoscopy    . Replacement total knee Left 10/2009  . Total shoulder replacement Left 01/19/2012  . Total shoulder arthroplasty  01/18/2012    Procedure: TOTAL SHOULDER ARTHROPLASTY;  Surgeon: Mable ParisJustin William Chandler, MD;  [facility];  Service: Orthopedics;  Laterality: Left;  . Shoulder open rotator cuff repair  03/01/2012    Procedure: ROTATOR CUFF REPAIR SHOULDER OPEN;  Surgeon: Mable ParisJustin William Chandler, MD;  [facility];  Service: Orthopedics;  Laterality: Left;  SUBSCAPULARIS REPAIR  . Transthoracic echocardiogram  06/2011    LV mildly dilated, EF=>55%; mild mitral annular calcif, mild MR; mild TR; AV mildly sclerotic, mild aortic regurg  . Stress test - adenosine  thallium  1997    apical and inferior segments with questionable infarct and ischemia in anteroseptal & inferoseptal segments (Dr. Standley Brooking. Shepherd)  . Cardiac catheterization  04/20/1996    patent coronaries, EF 74% (Dr. Aram CandelaW. Gamble)    Interval History: Has been doing pretty well since last visit except 1 episode.  1 episode of "a little CP" & feeling weak while @ church --> lasted a few min.  Doing fine since.  Had a hard time breathing --> to ER -> Panick Attack.  Otherwise, has been walking with her daughter & adjusted her diet & has lost weight - very happy. No CP with walking, but due to deconditioning, does have to stop & rest to catch her breath every now & then.    Cardiovascular ROS: positive for - chest pain, dyspnea on exertion and stable fluctuating L sided LE Edema; occasional positional dizziness negative for - irregular heartbeat, loss of consciousness, murmur, orthopnea, palpitations, paroxysmal nocturnal dyspnea, rapid heart rate, shortness of breath or TIA/amaurosis fugax, syncope/near syncope No melena, hematochezia, hematuria, or epstaxis. No claudication.  ROS: A comprehensive was performed. Review of Systems  Constitutional: Positive for weight loss (very happy). Negative for malaise/fatigue.  HENT: Negative for congestion.   Respiratory: Negative for cough,  shortness of breath and wheezing.   Cardiovascular: Positive for leg swelling (L > R).  Gastrointestinal: Positive for heartburn (better). Negative for blood in stool and melena (did have dark stools - no blood).  Genitourinary: Negative for hematuria and flank pain.  Musculoskeletal: Positive for back pain and joint pain (Planning R hip Sgx - needs both; Dr. Odessa Fleming). Negative for falls.  Neurological: Negative for loss of consciousness.  Psychiatric/Behavioral: Negative for depression.    Current Outpatient Prescriptions on File Prior to Visit  Medication Sig Dispense Refill  . acetaminophen (TYLENOL) 650 MG CR  tablet Take 1,300 mg by mouth at bedtime.    Marland Kitchen aspirin EC 81 MG tablet Take 81 mg by mouth daily.    Marland Kitchen docusate sodium 100 MG CAPS Take 100 mg by mouth 2 (two) times daily as needed for constipation. 20 capsule 0  . furosemide (LASIX) 20 MG tablet Take 1 tablet (20 mg total) by mouth daily. 90 tablet 3  . furosemide (LASIX) 20 MG tablet TAKE ONE TABLET BY MOUTH ONCE DAILY 90 tablet 2  . IRON PO Take 55 mg by mouth daily with breakfast. Per pt report    . lisinopril (PRINIVIL,ZESTRIL) 20 MG tablet TAKE 1 TABLET (20 MG TOTAL) BY MOUTH DAILY. 30 tablet 0  . LORazepam (ATIVAN) 0.5 MG tablet TAKE ONE TABLET BY MOUTH EVERY 8 HOURS FOR ANXIETY 30 tablet 5  . Phenylephrine-Cocoa Butter (PREPARATION H) 0.25-88.44 % SUPP Place rectally as needed.    . pravastatin (PRAVACHOL) 40 MG tablet Take 1 tablet (40 mg total) by mouth 2 (two) times daily. 60 tablet 4   No current facility-administered medications on file prior to visit.   Allergies  Allergen Reactions  . Effexor [Venlafaxine Hcl]     Lip swelling patient advised to d/c  . Lisinopril     Lip swelling patient advised to d/c  . Sulfa Drugs Cross Reactors Itching   History  Substance Use Topics  . Smoking status: Former Smoker -- 1.00 packs/day for 25 years    Types: Cigarettes    Quit date: 03/07/1984  . Smokeless tobacco: Former Neurosurgeon  . Alcohol Use: No   Family History  Problem Relation Age of Onset  . Heart disease Mother   . Heart disease Father   . Heart disease Sister   . Hypertension Sister   . Heart disease Sister   . Hypertension Sister   . Heart disease Sister   . Hypertension Sister     Wt Readings from Last 3 Encounters:  07/23/14 237 lb 8 oz (107.729 kg)  06/03/14 240 lb (108.863 kg)  01/11/14 254 lb (115.214 kg)   PHYSICAL EXAM BP 126/78 mmHg  Pulse 69  Ht 5' (1.524 m)  Wt 237 lb 8 oz (107.729 kg)  BMI 46.38 kg/m2 General appearance: alert, cooperative, appears stated age, no distress, morbidly obese and  Answers questions appropriately. Pleasant mood with normal affect. Neck: no adenopathy, no carotid bruit, no JVD, supple, symmetrical, trachea midline and thyroid not enlarged, symmetric, no tenderness/mass/nodules Lungs: clear to auscultation bilaterally, normal percussion bilaterally and Nonlabored, good air movement Heart: RRR, normal S1/S2. No R./G. with soft 1/6 C-D SEM at RUSB; nondisplaced PMI Abdomen: soft, non-tender; bowel sounds normal; no masses, no organomegaly and Morbidly obese; really unable to palpate at this am due to body habitus Extremities: Swollen knees bilaterally with soft puffy obese legs that do not appear to have pitting edema. Pulses: 2+ and symmetric Palpable 1-2+ bilateral lower extremity Neurologic:  Alert and oriented X 3, normal strength and tone. Normal symmetric reflexes.    Adult ECG Report  Rate: 69 ;  Rhythm: normal sinus rhythm, premature ventricular contractions (PVC) and non-specific ST-T changes  Narrative Interpretation: Stable EKG  Recent Labs:   Lab Results  Component Value Date   CHOL 211* 01/11/2014   HDL 74 01/11/2014   LDLCALC 124* 01/11/2014   TRIG 64 01/11/2014   CHOLHDL 2.9 01/11/2014   On statin - monitored by PCP  ASSESSMENT / PLAN: Problem List Items Addressed This Visit    Bilateral edema of lower extremity (Chronic)   Relevant Orders   EKG 12-Lead (Completed)   Essential hypertension (Chronic)    Well-controlled on current dose of lisinopril. She does take a standing dose of low-dose Lasix for her chronic lower extremity edema. No suggestion that the edema is related to right heart failure or heart failure because of lack of other symptoms.      Relevant Orders   EKG 12-Lead (Completed)   Hyperlipidemia with target LDL less than 100 (Chronic)    Currently taking pravastatin twice a day. Most recent LDL was 124 which shows actually worsening numbers from March of last year. Hopefully on recheck with her weight loss this  number will have improved. Her goal should be less than 100 for LDL. She is at goal for HDL.      Metabolic syndrome (Chronic)   Relevant Orders   EKG 12-Lead (Completed)   Morbid obesity - BMI 46 (Chronic)    Finally we have had some positive movement in her weight loss. She is breaks at about 17 pounds weight loss and posterior continue to walk and exercise. Unfortunately with her hip bothering her and she is limited. Hopefully as she recovers from her insurance 3 she will continue to lose weight and not gain back weight.      Preoperative cardiovascular examination - Primary    Hopeful for upcoming hip surgery.  She does not have an active cardiac conditions. Does not have diabetes on insulin and has normal renal function. No pre-or prior stroke. From a cardiac standpoint she would therefore be considered a low risk patient for an intermediate risk surgery. By definition she he is morbidly obese and would therefore have issues with recovery and rehabilitation. However she has been doing fairly well with exercising with her daughter walking and changing her diet. She has lost 17 pounds since October. This would indicate that she is motivated to rehabilitation. In the S. Of cardiac symptoms, I would not recommend any preoperative cardiovascular evaluation beyond clinical exam above.      Relevant Orders   EKG 12-Lead (Completed)      Orders Placed This Encounter  Procedures  . EKG 12-Lead   No orders of the defined types were placed in this encounter.    Followup: 6 months   Nell Schrack, Piedad Climes, M.D., M.S. Interventional Cardiologist   Pager # [phone]

## 2014-07-25 ENCOUNTER — Encounter: Payer: Self-pay | Admitting: Cardiology

## 2014-07-25 NOTE — Assessment & Plan Note (Signed)
Currently taking pravastatin twice a day. Most recent LDL was 124 which shows actually worsening numbers from March of last year. Hopefully on recheck with her weight loss this number will have improved. Her goal should be less than 100 for LDL. She is at goal for HDL.

## 2014-07-25 NOTE — Assessment & Plan Note (Signed)
Finally we have had some positive movement in her weight loss. She is breaks at about 17 pounds weight loss and posterior continue to walk and exercise. Unfortunately with her hip bothering her and she is limited. Hopefully as she recovers from her insurance 3 she will continue to lose weight and not gain back weight.

## 2014-07-25 NOTE — Assessment & Plan Note (Signed)
Hopeful for upcoming hip surgery.  She does not have an active cardiac conditions. Does not have diabetes on insulin and has normal renal function. No pre-or prior stroke. From a cardiac standpoint she would therefore be considered a low risk patient for an intermediate risk surgery. By definition she he is morbidly obese and would therefore have issues with recovery and rehabilitation. However she has been doing fairly well with exercising with her daughter walking and changing her diet. She has lost 17 pounds since October. This would indicate that she is motivated to rehabilitation. In the S. Of cardiac symptoms, I would not recommend any preoperative cardiovascular evaluation beyond clinical exam above.

## 2014-07-25 NOTE — Assessment & Plan Note (Addendum)
Well-controlled on current dose of lisinopril. She does take a standing dose of low-dose Lasix for her chronic lower extremity edema. No suggestion that the edema is related to right heart failure or heart failure because of lack of other symptoms.

## 2014-07-26 DIAGNOSIS — M5416 Radiculopathy, lumbar region: Secondary | ICD-10-CM | POA: Diagnosis not present

## 2014-07-26 DIAGNOSIS — M4806 Spinal stenosis, lumbar region: Secondary | ICD-10-CM | POA: Diagnosis not present

## 2014-07-30 DIAGNOSIS — M4806 Spinal stenosis, lumbar region: Secondary | ICD-10-CM | POA: Diagnosis not present

## 2014-08-01 ENCOUNTER — Ambulatory Visit (INDEPENDENT_AMBULATORY_CARE_PROVIDER_SITE_OTHER): Payer: MEDICARE | Admitting: Emergency Medicine

## 2014-08-01 VITALS — BP 119/83 | HR 73 | Temp 98.0°F | Resp 18 | Ht 60.0 in | Wt 248.0 lb

## 2014-08-01 DIAGNOSIS — F329 Major depressive disorder, single episode, unspecified: Secondary | ICD-10-CM | POA: Diagnosis not present

## 2014-08-01 DIAGNOSIS — I10 Essential (primary) hypertension: Secondary | ICD-10-CM

## 2014-08-01 DIAGNOSIS — F32A Depression, unspecified: Secondary | ICD-10-CM

## 2014-08-01 MED ORDER — LISINOPRIL 20 MG PO TABS: ORAL_TABLET | ORAL | Status: DC

## 2014-08-01 MED ORDER — FUROSEMIDE 20 MG PO TABS: 20.0000 mg | ORAL_TABLET | Freq: Every day | ORAL | Status: DC

## 2014-08-01 MED ORDER — LORAZEPAM 0.5 MG PO TABS
ORAL_TABLET | ORAL | Status: DC
Start: 2014-08-01 — End: 2015-12-27

## 2014-08-01 NOTE — Progress Notes (Signed)
Subjective:  This chart was scribed for Lesle ChrisSteven Daub, MD by Carl Bestelina Holson, Medical Scribe. This patient was seen in Room 2 and the patient's care was started at 8:35 AM.   Patient ID: [name], female    DOB: 06-20-1940, 74 y.o.   MRN: [id number]  HPI HPI Comments: [name] is a 74 y.o. female who presents to the [facility] for a medication refill.  She has not experienced any negative side effects while taking Lisinopril and Lasix.   She is also complaining of a mole on her right leg.  She states that the area is sore but does not itch.    She was sent to the hospital last month while at church due to SOB.  She states that she could not catch her breath and felt discomfort in her chest at that time.  Tests did not reveal any problems with her heart and she has not experienced these symptoms since that incident.  She has seen her cardiologist recently and had a normal exam.  She is having hip surgery soon and just had her MRI done of her back last week.  She is going to have a back injection of epidural before her hip surgery.  Her orthopedist is Dr. Luiz BlareGraves.  Past Medical History  Diagnosis Date  . Anemia     on iron  . Hypertension   . Hyperlipidemia     on med  . Anxiety   . Arthritis     shoulders, knees, hands, hips; status post left knee surgery  . Depression   . H/O: pneumonia 2003  . Constipation   . GERD (gastroesophageal reflux disease)     TAKES TUMS  . Morbid obesity with BMI of 45.0-49.9, adult    Past Surgical History  Procedure Laterality Date  . Wisdom tooth extraction    . Svd      x 7  . Tubal ligation    . Knee surgery Left 11/2001  . Replacement total knee Left 10/2009  . Carpal tunnel release Left   . Colonoscopy    . Replacement total knee Left 10/2009  . Total shoulder replacement Left 01/19/2012  . Total shoulder arthroplasty  01/18/2012    Procedure: TOTAL SHOULDER ARTHROPLASTY;  Surgeon: Mable ParisJustin William Chandler, MD;   [facility];  Service: Orthopedics;  Laterality: Left;  . Shoulder open rotator cuff repair  03/01/2012    Procedure: ROTATOR CUFF REPAIR SHOULDER OPEN;  Surgeon: Mable ParisJustin William Chandler, MD;  [facility];  Service: Orthopedics;  Laterality: Left;  SUBSCAPULARIS REPAIR  . Transthoracic echocardiogram  06/2011    LV mildly dilated, EF=>55%; mild mitral annular calcif, mild MR; mild TR; AV mildly sclerotic, mild aortic regurg  . Stress test - adenosine thallium  1997    apical and inferior segments with questionable infarct and ischemia in anteroseptal & inferoseptal segments (Dr. Standley Brooking. Shepherd)  . Cardiac catheterization  04/20/1996    patent coronaries, EF 74% (Dr. Aram CandelaW. Gamble)   Family History  Problem Relation Age of Onset  . Heart disease Mother   . Heart disease Father   . Heart disease Sister   . Hypertension Sister   . Heart disease Sister   . Hypertension Sister   . Heart disease Sister   . Hypertension Sister    History   Social History  . Marital Status: Widowed    Spouse Name: N/A  . Number of Children: 7  . Years of  Education: N/A   Occupational History  .     Social History Main Topics  . Smoking status: Former Smoker -- 1.00 packs/day for 25 years    Types: Cigarettes    Quit date: 03/07/1984  . Smokeless tobacco: Former Neurosurgeon  . Alcohol Use: No  . Drug Use: No  . Sexual Activity: No   Other Topics Concern  . Not on file   Social History Narrative    She is a widowed mother of 72, grandmother of 5, great-grandmother of 6,   She does not drink alcohol or smoke cigarettes.   She previously tried exercise routinely but is not exercising as much lately. Her excuse is do to knee pain and back pain as well as cold weather.   Allergies  Allergen Reactions  . Effexor [Venlafaxine Hcl]     Lip swelling patient advised to d/c  . Lisinopril     Lip swelling patient advised to d/c  . Sulfa Drugs Cross Reactors Itching    Review of Systems   Objective:    Physical Exam  Constitutional: She is oriented to person, place, and time. She appears well-developed and well-nourished.  HENT:  Head: Normocephalic and atraumatic.  Mouth/Throat: Oropharynx is clear and moist. No oropharyngeal exudate.  Eyes: Conjunctivae and EOM are normal. Pupils are equal, round, and reactive to light.  Bilateral cataracts.  Neck: Normal range of motion.  Cardiovascular: Normal rate, regular rhythm and normal heart sounds.   No murmur heard. Pulmonary/Chest: Effort normal and breath sounds normal. No respiratory distress. She has no wheezes. She has no rales.  Musculoskeletal: Normal range of motion. She exhibits edema.  1+ swelling of both legs.    Neurological: She is alert and oriented to person, place, and time.  Skin: Skin is warm and dry.  1x.5cm flat mole on her lateral right leg.  Psychiatric: She has a normal mood and affect. Her behavior is normal.  Nursing note and vitals reviewed.   Results for orders placed or performed during the hospital encounter of 06/03/14  Basic metabolic panel  Result Value Ref Range   Sodium 144 135 - 145 mmol/L   Potassium 4.7 3.5 - 5.1 mmol/L   Chloride 106 96 - 112 mmol/L   CO2 34 (H) 19 - 32 mmol/L   Glucose, Bld 99 70 - 99 mg/dL   BUN 15 6 - 23 mg/dL   Creatinine, Ser 9.60 0.50 - 1.10 mg/dL   Calcium 45.4 8.4 - 09.8 mg/dL   GFR calc non Af Amer 67 (L) >90 mL/min   GFR calc Af Amer 78 (L) >90 mL/min   Anion gap 4 (L) 5 - 15  CBC  Result Value Ref Range   WBC 8.8 4.0 - 10.5 K/uL   RBC 4.09 3.87 - 5.11 MIL/uL   Hemoglobin 12.1 12.0 - 15.0 g/dL   HCT 11.9 14.7 - 82.9 %   MCV 93.6 78.0 - 100.0 fL   MCH 29.6 26.0 - 34.0 pg   MCHC 31.6 30.0 - 36.0 g/dL   RDW 56.2 13.0 - 86.5 %   Platelets 289 150 - 400 K/uL  I-stat troponin, ED  Result Value Ref Range   Troponin i, poc 0.00 0.00 - 0.08 ng/mL   Comment 3            BP 119/83 mmHg  Pulse 73  Temp(Src) 98 F (36.7 C) (Oral)  Resp 18  Ht 5' (1.524 m)  Wt  248 lb (112.492 kg)  BMI 48.43 kg/m2  SpO2 94% Assessment & Plan:  Patient had recent blood work in emergency room.  She has received cardiology clearance.  She is not allergic to Lisinopril but allergic to Effexor.  Medications were refilled to include Lasix and Lisinopril.  I also refilled her lorazepam.I personally performed the services described in this documentation, which was scribed in my presence. The recorded information has been reviewed and is accurate.

## 2014-08-21 DIAGNOSIS — M4806 Spinal stenosis, lumbar region: Secondary | ICD-10-CM | POA: Diagnosis not present

## 2014-09-14 DIAGNOSIS — M4806 Spinal stenosis, lumbar region: Secondary | ICD-10-CM | POA: Diagnosis not present

## 2014-09-24 DIAGNOSIS — M7061 Trochanteric bursitis, right hip: Secondary | ICD-10-CM | POA: Diagnosis not present

## 2014-09-24 DIAGNOSIS — M25551 Pain in right hip: Secondary | ICD-10-CM | POA: Diagnosis not present

## 2014-09-24 DIAGNOSIS — M1711 Unilateral primary osteoarthritis, right knee: Secondary | ICD-10-CM | POA: Diagnosis not present

## 2014-10-05 ENCOUNTER — Other Ambulatory Visit: Payer: Self-pay | Admitting: Emergency Medicine

## 2014-10-05 DIAGNOSIS — Z1231 Encounter for screening mammogram for malignant neoplasm of breast: Secondary | ICD-10-CM

## 2014-10-30 ENCOUNTER — Ambulatory Visit (HOSPITAL_COMMUNITY)
Admission: RE | Admit: 2014-10-30 | Discharge: 2014-10-30 | Disposition: A | Discharge status: Home / Self Care | Payer: MEDICARE | Source: Ambulatory Visit | Attending: Emergency Medicine | Admitting: Emergency Medicine

## 2014-10-30 DIAGNOSIS — Z1231 Encounter for screening mammogram for malignant neoplasm of breast: Secondary | ICD-10-CM

## 2014-11-05 ENCOUNTER — Other Ambulatory Visit: Payer: Self-pay | Admitting: Emergency Medicine

## 2014-11-26 DIAGNOSIS — M1711 Unilateral primary osteoarthritis, right knee: Secondary | ICD-10-CM | POA: Diagnosis not present

## 2014-11-26 DIAGNOSIS — M1611 Unilateral primary osteoarthritis, right hip: Secondary | ICD-10-CM | POA: Diagnosis not present

## 2014-12-06 ENCOUNTER — Encounter: Payer: Self-pay | Admitting: Emergency Medicine

## 2014-12-06 ENCOUNTER — Ambulatory Visit (INDEPENDENT_AMBULATORY_CARE_PROVIDER_SITE_OTHER): Payer: MEDICARE | Admitting: Emergency Medicine

## 2014-12-06 VITALS — BP 102/64 | HR 71 | Temp 98.5°F | Resp 18 | Wt 243.0 lb

## 2014-12-06 DIAGNOSIS — I10 Essential (primary) hypertension: Secondary | ICD-10-CM | POA: Diagnosis not present

## 2014-12-06 DIAGNOSIS — D6489 Other specified anemias: Secondary | ICD-10-CM | POA: Diagnosis not present

## 2014-12-06 DIAGNOSIS — Z23 Encounter for immunization: Secondary | ICD-10-CM

## 2014-12-06 DIAGNOSIS — E785 Hyperlipidemia, unspecified: Secondary | ICD-10-CM | POA: Diagnosis not present

## 2014-12-06 LAB — COMPLETE METABOLIC PANEL WITH GFR
ALT: 10 U/L (ref 6–29)
AST: 17 U/L (ref 10–35)
Albumin: 4 g/dL (ref 3.6–5.1)
Alkaline Phosphatase: 58 U/L (ref 33–130)
BILIRUBIN TOTAL: 0.5 mg/dL (ref 0.2–1.2)
BUN: 14 mg/dL (ref 7–25)
CO2: 29 mmol/L (ref 20–31)
Calcium: 9.9 mg/dL (ref 8.6–10.4)
Chloride: 102 mmol/L (ref 98–110)
Creat: 0.68 mg/dL (ref 0.60–0.93)
GFR, EST NON AFRICAN AMERICAN: 88 mL/min (ref >=60)
Glucose, Bld: 84 mg/dL (ref 65–99)
POTASSIUM: 4.9 mmol/L (ref 3.5–5.3)
SODIUM: 143 mmol/L (ref 135–146)
Total Protein: 7 g/dL (ref 6.1–8.1)

## 2014-12-06 LAB — LIPID PANEL
CHOL/HDL RATIO: 2.4 ratio (ref <=5.0)
Cholesterol: 198 mg/dL (ref 125–200)
HDL: 83 mg/dL (ref >=46)
LDL Cholesterol: 99 mg/dL (ref <130)
TRIGLYCERIDES: 80 mg/dL (ref <150)
VLDL: 16 mg/dL (ref <30)

## 2014-12-06 LAB — CBC WITH DIFFERENTIAL/PLATELET
Basophils Absolute: 0 10*3/uL (ref 0.0–0.1)
Basophils Relative: 0 % (ref 0–1)
EOS ABS: 0.2 10*3/uL (ref 0.0–0.7)
Eosinophils Relative: 2 % (ref 0–5)
HCT: 38.2 % (ref 36.0–46.0)
Hemoglobin: 12.3 g/dL (ref 12.0–15.0)
LYMPHS ABS: 2.5 10*3/uL (ref 0.7–4.0)
Lymphocytes Relative: 33 % (ref 12–46)
MCH: 29.9 pg (ref 26.0–34.0)
MCHC: 32.2 g/dL (ref 30.0–36.0)
MCV: 92.9 fL (ref 78.0–100.0)
MONO ABS: 0.5 10*3/uL (ref 0.1–1.0)
MONOS PCT: 7 % (ref 3–12)
MPV: 9.9 fL (ref 8.6–12.4)
Neutro Abs: 4.4 10*3/uL (ref 1.7–7.7)
Neutrophils Relative %: 58 % (ref 43–77)
Platelets: 330 10*3/uL (ref 150–400)
RBC: 4.11 MIL/uL (ref 3.87–5.11)
RDW: 14.1 % (ref 11.5–15.5)
WBC: 7.5 10*3/uL (ref 4.0–10.5)

## 2014-12-06 LAB — IFOBT (OCCULT BLOOD): IMMUNOLOGICAL FECAL OCCULT BLOOD TEST: NEGATIVE

## 2014-12-06 NOTE — Progress Notes (Signed)
Patient ID: [name], female   DOB: 1940/08/12, 74 y.o.   MRN: [id number]    This chart was scribed for Earl Lites, MD by [facility], medical scribe at [facility].The patient was seen in exam room 21 and the patient's care was started at 11:40 AM.  Chief Complaint:  Chief Complaint  Patient presents with  . may have fluid around knees   HPI: [name] is a 74 y.o. female who reports to [facility] today complaining of bilateral lower extremity swelling. Pt says she needs surgery in both hips and right knee. Nothing scheduled yet. Currently trying injections to see if this can prevent her from having surgery. Sees Dr. Herbie Baltimore twice a year.  Health Maintenance: UTD on mammogram, colonoscopy. Next colonoscopy due in three year. Flu shot given today.   Past Medical History  Diagnosis Date  . Anemia     on iron  . Hypertension   . Hyperlipidemia     on med  . Anxiety   . Arthritis     shoulders, knees, hands, hips; status post left knee surgery  . Depression   . H/O: pneumonia 2003  . Constipation   . GERD (gastroesophageal reflux disease)     TAKES TUMS  . Morbid obesity with BMI of 45.0-49.9, adult    Past Surgical History  Procedure Laterality Date  . Wisdom tooth extraction    . Svd      x 7  . Tubal ligation    . Knee surgery Left 11/2001  . Replacement total knee Left 10/2009  . Carpal tunnel release Left   . Colonoscopy    . Replacement total knee Left 10/2009  . Total shoulder replacement Left 01/19/2012  . Total shoulder arthroplasty  01/18/2012    Procedure: TOTAL SHOULDER ARTHROPLASTY;  Surgeon: Mable Paris, MD;  Location: [facility] OR;  Service: Orthopedics;  Laterality: Left;  . Shoulder open rotator cuff repair  03/01/2012    Procedure: ROTATOR CUFF REPAIR SHOULDER OPEN;  Surgeon: Mable Paris, MD;  Location: [facility] OR;  Service: Orthopedics;  Laterality: Left;  SUBSCAPULARIS REPAIR  . Transthoracic echocardiogram  06/2011    LV mildly dilated, EF=>55%; mild mitral annular calcif, mild MR; mild TR; AV mildly sclerotic, mild aortic regurg  . Stress test - adenosine thallium  1997    apical and inferior segments with questionable infarct and ischemia in anteroseptal & inferoseptal segments (Dr. Standley Brooking)  . Cardiac catheterization  04/20/1996    patent coronaries, EF 74% (Dr. Aram Candela)   Social History   Social History  . Marital Status: Widowed    Spouse Name: N/A  . Number of Children: 7  . Years of Education: N/A   Occupational History  .     Social History Main Topics  . Smoking status: Former Smoker -- 1.00 packs/day for 25 years    Types: Cigarettes    Quit date: 03/07/1984  . Smokeless tobacco: Former Neurosurgeon  . Alcohol Use: No  . Drug Use: No  . Sexual Activity: No   Other Topics Concern  . None   Social History Narrative    She is a widowed mother of 7, grandmother of 5, great-grandmother of 6,   She does not drink alcohol or smoke cigarettes.   She previously tried exercise routinely but is not exercising as much lately. Her excuse is do to knee pain and back pain as well as cold weather.   Family History  Problem Relation Age of Onset  . Heart disease Mother   . Heart disease Father   . Heart disease Sister   . Hypertension Sister   . Heart disease Sister   . Hypertension Sister   . Heart disease Sister   . Hypertension Sister    Allergies  Allergen Reactions  . Effexor [Venlafaxine Hcl]     Lip swelling patient advised to d/c  . Sulfa Drugs Cross Reactors Itching   Prior to Admission medications   Medication Sig Start Date End Date Taking? Authorizing Provider  acetaminophen (TYLENOL) 650 MG CR tablet Take 1,300 mg by mouth at bedtime.   Yes Historical Provider, MD  aspirin EC 81 MG tablet Take 81 mg by mouth daily.   Yes Historical Provider, MD  furosemide (LASIX) 20 MG tablet Take 1 tablet (20 mg total) by mouth daily. 08/01/14  Yes Collene Gobble, MD  IRON PO Take 55 mg  by mouth daily with breakfast. Per pt report   Yes Historical Provider, MD  lisinopril (PRINIVIL,ZESTRIL) 20 MG tablet TAKE 1 TABLET (20 MG TOTAL) BY MOUTH DAILY. 08/01/14  Yes Collene Gobble, MD  LORazepam (ATIVAN) 0.5 MG tablet TAKE ONE TABLET BY MOUTH EVERY 8 HOURS FOR ANXIETY 08/01/14  Yes Collene Gobble, MD  Phenylephrine-Cocoa Butter (PREPARATION H) 0.25-88.44 % SUPP Place rectally as needed.   Yes Historical Provider, MD  pravastatin (PRAVACHOL) 40 MG tablet TAKE ONE TABLET BY MOUTH TWICE DAILY 11/06/14  Yes Carmelina Dane, MD   ROS: The patient denies fevers, chills, night sweats, unintentional weight loss, chest pain, palpitations, wheezing, dyspnea on exertion, nausea, vomiting, abdominal pain, dysuria, hematuria, melena, numbness, weakness, or tingling.  All other systems have been reviewed and were otherwise negative with the exception of those mentioned in the HPI and as above.    PHYSICAL EXAM: Filed Vitals:   12/06/14 1051  BP: 102/64  Pulse: 71  Temp: 98.5 F (36.9 C)  Resp: 18   Body mass index is 47.46 kg/(m^2).  General: Alert, no acute distress HEENT:  Normocephalic, atraumatic, oropharynx patent. Eye: Nonie Hoyer [facility] Cardiovascular:  Regular rate and rhythm, no rubs murmurs or gallops.  No Carotid bruits, radial pulse intact. No pedal edema.  Respiratory: Clear to auscultation bilaterally.  No wheezes, rales, or rhonchi.  No cyanosis, no use of accessory musculature Abdominal: No organomegaly, abdomen is soft and non-tender, positive bowel sounds.  No masses. Musculoskeletal: Gait intact. No edema, tenderness.  Large healed scar of the left knee. Severe degenerative changes of the right knee with pain on extension and flexion.  Skin: No rashes. Neurologic: Facial musculature symmetric. Psychiatric: Patient acts appropriately throughout our interaction. Lymphatic: No cervical or submandibular lymphadenopathy Genitourinary/Anorectal: no masses felt, no active bleeding.  I did not feel any rectal masses on rectal exam.  LABS: Results for orders placed or performed during the hospital encounter of 06/03/14  Basic metabolic panel  Result Value Ref Range   Sodium 144 135 - 145 mmol/L   Potassium 4.7 3.5 - 5.1 mmol/L   Chloride 106 96 - 112 mmol/L   CO2 34 (H) 19 - 32 mmol/L   Glucose, Bld 99 70 - 99 mg/dL   BUN 15 6 - 23 mg/dL   Creatinine, Ser 1.61 0.50 - 1.10 mg/dL   Calcium 09.6 8.4 - 04.5 mg/dL   GFR calc non Af Amer 67 (L) >90 mL/min   GFR calc Af Amer 78 (L) >90 mL/min   Anion gap 4 (L)  5 - 15  CBC  Result Value Ref Range   WBC 8.8 4.0 - 10.5 K/uL   RBC 4.09 3.87 - 5.11 MIL/uL   Hemoglobin 12.1 12.0 - 15.0 g/dL   HCT 29.5 18.8 - 41.6 %   MCV 93.6 78.0 - 100.0 fL   MCH 29.6 26.0 - 34.0 pg   MCHC 31.6 30.0 - 36.0 g/dL   RDW 60.6 30.1 - 60.1 %   Platelets 289 150 - 400 K/uL  I-stat troponin, ED  Result Value Ref Range   Troponin i, poc 0.00 0.00 - 0.08 ng/mL   Comment 3           ASSESSMENT/PLAN: She was given her flu shot and pneumonia shot today. Her last cholesterol was not at goal. She currently is on pravastatin. This a need to be changed to a different statin such as Crestor or Lipitor. Gross sideeffects, risk and benefits, and alternatives of medications d/w patient. Patient is aware that all medications have potential sideeffects and we are unable to predict every sideeffect or drug-drug interaction that may occur.I personally performed the services described in this documentation, which was scribed in my presence. The recorded information has been reviewed and is accurate.    Lesle Chris MD 12/06/2014 11:40 AM

## 2014-12-06 NOTE — Addendum Note (Signed)
Addended by: Lesle Chris A on: 12/06/2014 12:56 PM   Modules accepted: Orders

## 2014-12-07 ENCOUNTER — Encounter: Payer: Self-pay | Admitting: Family Medicine

## 2015-01-15 ENCOUNTER — Encounter: Payer: Self-pay | Admitting: Emergency Medicine

## 2015-01-17 DIAGNOSIS — M1611 Unilateral primary osteoarthritis, right hip: Secondary | ICD-10-CM | POA: Diagnosis not present

## 2015-01-17 DIAGNOSIS — M1711 Unilateral primary osteoarthritis, right knee: Secondary | ICD-10-CM | POA: Diagnosis not present

## 2015-01-24 DIAGNOSIS — M1711 Unilateral primary osteoarthritis, right knee: Secondary | ICD-10-CM | POA: Diagnosis not present

## 2015-01-31 DIAGNOSIS — M1711 Unilateral primary osteoarthritis, right knee: Secondary | ICD-10-CM | POA: Diagnosis not present

## 2015-02-01 ENCOUNTER — Ambulatory Visit (INDEPENDENT_AMBULATORY_CARE_PROVIDER_SITE_OTHER): Payer: MEDICARE | Admitting: Cardiology

## 2015-02-01 VITALS — BP 110/78 | HR 77 | Ht 60.0 in | Wt 242.8 lb

## 2015-02-01 DIAGNOSIS — I10 Essential (primary) hypertension: Secondary | ICD-10-CM

## 2015-02-01 DIAGNOSIS — Z01810 Encounter for preprocedural cardiovascular examination: Secondary | ICD-10-CM

## 2015-02-01 DIAGNOSIS — E8881 Metabolic syndrome: Secondary | ICD-10-CM

## 2015-02-01 DIAGNOSIS — E785 Hyperlipidemia, unspecified: Secondary | ICD-10-CM

## 2015-02-01 DIAGNOSIS — R600 Localized edema: Secondary | ICD-10-CM | POA: Diagnosis not present

## 2015-02-01 NOTE — Patient Instructions (Signed)
CHANGE IN CURRENT MEDICATIONS  Your physician wants you to follow-up in 6 MONTHS WITH DR HARDING.  You will receive a reminder letter in the mail two months in advance. If you don't receive a letter, please call our office to schedule the follow-up appointment.  If you need a refill on your cardiac medications before your next appointment, please call your pharmacy.

## 2015-02-01 NOTE — Progress Notes (Signed)
PCP: Lucilla EdinAUB, STEVE A, MD  Clinic Note: Chief Complaint  Patient presents with  . Follow-up    pt denied chest pain   . Leg Swelling    left leg  . Shortness of Breath    on exertion  . Hypertension    HPI: [name] Rayl is a 74 y.o. female with a PMH below who presents today for 6 month f/u -hypertension, hyperlipidemia. Cardiovascular monitoring.   Back to school for GED. Getting knee shots to help with OA pain.  MRI on back --  Back OK, just hip issues --> trying to avoid hip Sgx.    [name] Nieman was last seen in April 2016 - was for pre-op Hip Sgx - > they decided to hold off on Sgx. Able to walk around, but hurts when standing a lot.  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  Recent Hospitalizations: no  Studies Reviewed: none  Interval History: Stable from CV standpoint.   SOB with walking fast - but no CP.  More bothered by her hip then by any dyspnea or chest pain.  No chest pain or shortness of breath with rest or exertion.  No PND, orthopnea or edema.  No palpitations, or syncope/near syncope - but does have occasional orthostatic dizziness. No TIA/amaurosis fugax symptoms. No claudication.  ROS: A comprehensive was performed. Review of Systems  Constitutional: Negative for malaise/fatigue.  HENT: Negative for nosebleeds.   Respiratory: Negative for cough and shortness of breath.   Cardiovascular: Positive for leg swelling (mild -- can't get support stockings on -- needs help). Negative for claudication.  Gastrointestinal: Negative for heartburn, blood in stool and melena.  Genitourinary: Negative for hematuria.  Musculoskeletal: Positive for back pain and joint pain (Hips & knees). Negative for myalgias and falls.  Skin:       Spider vein  Neurological: Positive for dizziness (orthostatic). Negative for weakness and headaches.  Psychiatric/Behavioral: Negative.  The patient does not have insomnia.   All other systems reviewed and are  negative.   Past Medical History  Diagnosis Date  . Anemia     on iron  . Hypertension   . Hyperlipidemia     on med  . Anxiety   . Arthritis     shoulders, knees, hands, hips; status post left knee surgery  . Depression   . H/O: pneumonia 2003  . Constipation   . GERD (gastroesophageal reflux disease)     TAKES TUMS  . Morbid obesity with BMI of 45.0-49.9, adult [facility](HCC)     Past Surgical History  Procedure Laterality Date  . Wisdom tooth extraction    . Svd      x 7  . Tubal ligation    . Knee surgery Left 11/2001  . Replacement total knee Left 10/2009  . Carpal tunnel release Left   . Colonoscopy    . Replacement total knee Left 10/2009  . Total shoulder replacement Left 01/19/2012  . Total shoulder arthroplasty  01/18/2012    Procedure: TOTAL SHOULDER ARTHROPLASTY;  Surgeon: Mable ParisJustin William Chandler, MD;  [facility];  Service: Orthopedics;  Laterality: Left;  . Shoulder open rotator cuff repair  03/01/2012    Procedure: ROTATOR CUFF REPAIR SHOULDER OPEN;  Surgeon: Mable ParisJustin William Chandler, MD;  [facility];  Service: Orthopedics;  Laterality: Left;  SUBSCAPULARIS REPAIR  . Transthoracic echocardiogram  06/2011    LV mildly dilated, EF=>55%; mild mitral annular calcif, mild MR; mild TR; AV mildly  sclerotic, mild aortic regurg  . Stress test - adenosine thallium  1997    apical and inferior segments with questionable infarct and ischemia in anteroseptal & inferoseptal segments (Dr. Standley Brooking)  . Cardiac catheterization  04/20/1996    patent coronaries, EF 74% (Dr. Aram Candela)    Prior to Admission medications   Medication Sig Start Date End Date Taking? Authorizing Provider  aspirin EC 81 MG tablet Take 81 mg by mouth daily.   Yes Historical Provider, MD  furosemide (LASIX) 20 MG tablet Take 1 tablet (20 mg total) by mouth daily. 08/01/14  Yes Collene Gobble, MD  HYDROcodone-acetaminophen (NORCO/VICODIN) 5-325 MG tablet Take 1 tablet by mouth every 8 (eight) hours as  needed. for pain 01/24/15  Yes Historical Provider, MD  IRON PO Take 55 mg by mouth daily with breakfast. Per pt report   Yes Historical Provider, MD  lisinopril (PRINIVIL,ZESTRIL) 20 MG tablet TAKE 1 TABLET (20 MG TOTAL) BY MOUTH DAILY. 08/01/14  Yes Collene Gobble, MD  LORazepam (ATIVAN) 0.5 MG tablet TAKE ONE TABLET BY MOUTH EVERY 8 HOURS FOR ANXIETY 08/01/14  Yes Collene Gobble, MD  Phenylephrine-Cocoa Butter (PREPARATION H) 0.25-88.44 % SUPP Place rectally as needed.   Yes Historical Provider, MD  pravastatin (PRAVACHOL) 40 MG tablet TAKE ONE TABLET BY MOUTH TWICE DAILY 11/06/14  Yes Carmelina Dane, MD   Allergies  Allergen Reactions  . Effexor [Venlafaxine Hcl]     Lip swelling patient advised to d/c  . Sulfa Drugs Cross Reactors Itching     Social History   Social History  . Marital Status: Widowed    Spouse Name: N/A  . Number of Children: 7  . Years of Education: N/A   Occupational History  .     Social History Main Topics  . Smoking status: Former Smoker -- 1.00 packs/day for 25 years    Types: Cigarettes    Quit date: 03/07/1984  . Smokeless tobacco: Former Neurosurgeon  . Alcohol Use: No  . Drug Use: No  . Sexual Activity: No   Other Topics Concern  . None   Social History Narrative    She is a widowed mother of 7, grandmother of 5, great-grandmother of 6,   She does not drink alcohol or smoke cigarettes.   She previously tried exercise routinely but is not exercising as much lately. Her excuse is do to knee pain and back pain as well as cold weather.   Family History  Problem Relation Age of Onset  . Heart disease Mother   . Heart disease Father   . Heart disease Sister   . Hypertension Sister   . Heart disease Sister   . Hypertension Sister   . Heart disease Sister   . Hypertension Sister      Wt Readings from Last 3 Encounters:  02/01/15 242 lb 12.8 oz (110.133 kg)  12/06/14 243 lb (110.224 kg)  08/01/14 248 lb (112.492 kg)    PHYSICAL EXAM BP  110/78 mmHg  Pulse 77  Ht 5' (1.524 m)  Wt 242 lb 12.8 oz (110.133 kg)  BMI 47.42 kg/m2 General appearance: alert, cooperative, appears stated age, no distress, morbidly obese and Answers questions appropriately. Pleasant mood with normal affect. Neck: no adenopathy, no carotid bruit, no JVD, supple, symmetrical, trachea midline and thyroid not enlarged, symmetric, no tenderness/mass/nodules Lungs: clear to auscultation bilaterally, normal percussion bilaterally and Nonlabored, good air movement Heart: RRR, normal S1/S2. No R./G. with soft 1/6 C-D SEM  at RUSB; nondisplaced PMI Abdomen: soft, non-tender; bowel sounds normal; no masses, no organomegaly and Morbidly obese; really unable to palpate at this am due to body habitus Extremities: Swollen knees bilaterally with soft puffy obese legs that do not appear to have pitting edema. Pulses: 2+ and symmetric Palpable 1-2+ bilateral lower extremity Neurologic: Alert and oriented X 3, normal strength and tone. Normal symmetric reflexes.    Adult ECG Report  Rate: 77 ;  Rhythm: normal sinus rhythm and Nonspecific ST-T wave changes. Otherwise normal axis, intervals and durations.;   Narrative Interpretation: Stable EKG   Other studies Reviewed: Additional studies/ records that were reviewed today include:  Recent Labs:   Lab Results  Component Value Date   CHOL 198 12/06/2014   HDL 83 12/06/2014   LDLCALC 99 12/06/2014   TRIG 80 12/06/2014   CHOLHDL 2.4 12/06/2014     ASSESSMENT / PLAN: Problem List Items Addressed This Visit    Morbid obesity - BMI 46 (Chronic)    Remains morbidly obese, but has lost 6 pounds since last visit. We are starting to get some traction with weight loss. I think part of the problem is that she just simply can't do much exercise because of her hip. We talked about doing water aerobics and water walking. This can help minimize hip discomfort with walking.      Relevant Orders   EKG 12-Lead   Metabolic  syndrome (Chronic)    With morbid obesity, hypertension and dyslipidemia, she has CAD risk equivalent essentially. Need to continue to treat risk factors as described.      Hyperlipidemia with target LDL less than 100 (Chronic)    Continues to be on pravastatin -twice a day. Labs being monitored by PCP. Lab Results  Component Value Date   CHOL 198 12/06/2014   HDL 83 12/06/2014   LDLCALC 99 12/06/2014   TRIG 80 12/06/2014   CHOLHDL 2.4 12/06/2014   Notably improved LDL from last check. Was previously 124 now 99.      Relevant Orders   EKG 12-Lead   Essential hypertension (Chronic)    Excellent control on moderate dose ACE inhibitor. This is in addition to low-dose standing Lasix for edema. Continue to monitor      Relevant Orders   EKG 12-Lead   RESOLVED: Dyslipidemia (Chronic)   Bilateral edema of lower extremity (Chronic)    Stable. Left greater than right edema is managed with her standing dose of Lasix. Very rarely does she use an additional dose.         Current medicines are reviewed at length with the patient today. (+/- concerns) none The following changes have been made: none  Studies Ordered:   Orders Placed This Encounter  Procedures  . EKG 12-Lead     Follow-up in 6 MONTHS WITH DR HARDING.    [name], M.D., M.S. Interventional Cardiologist   Pager # [phone]

## 2015-02-03 ENCOUNTER — Encounter: Payer: Self-pay | Admitting: Cardiology

## 2015-02-03 NOTE — Assessment & Plan Note (Signed)
Excellent control on moderate dose ACE inhibitor. This is in addition to low-dose standing Lasix for edema. Continue to monitor

## 2015-02-03 NOTE — Assessment & Plan Note (Signed)
Continues to be on pravastatin -twice a day. Labs being monitored by PCP. Lab Results  Component Value Date   CHOL 198 12/06/2014   HDL 83 12/06/2014   LDLCALC 99 12/06/2014   TRIG 80 12/06/2014   CHOLHDL 2.4 12/06/2014   Notably improved LDL from last check. Was previously 124 now 99.

## 2015-02-03 NOTE — Assessment & Plan Note (Signed)
Stable. Left greater than right edema is managed with her standing dose of Lasix. Very rarely does she use an additional dose.

## 2015-02-03 NOTE — Assessment & Plan Note (Signed)
With morbid obesity, hypertension and dyslipidemia, she has CAD risk equivalent essentially. Need to continue to treat risk factors as described.

## 2015-02-03 NOTE — Assessment & Plan Note (Signed)
Remains morbidly obese, but has lost 6 pounds since last visit. We are starting to get some traction with weight loss. I think part of the problem is that she just simply can't do much exercise because of her hip. We talked about doing water aerobics and water walking. This can help minimize hip discomfort with walking.

## 2015-02-04 ENCOUNTER — Telehealth: Payer: Self-pay

## 2015-02-04 MED ORDER — PRAVASTATIN SODIUM 40 MG PO TABS: 40.0000 mg | ORAL_TABLET | Freq: Two times a day (BID) | ORAL | Status: DC

## 2015-02-04 NOTE — Telephone Encounter (Signed)
Pt states the pharmacy told her to call us regarding her PRAVASTATIN 40 MG. Please call pt at [phone]     [facility] [address] [facility] [place]

## 2015-02-04 NOTE — Telephone Encounter (Signed)
Rx sent.  Pt notified. 

## 2015-02-07 DIAGNOSIS — M1711 Unilateral primary osteoarthritis, right knee: Secondary | ICD-10-CM | POA: Diagnosis not present

## 2015-02-14 DIAGNOSIS — M1711 Unilateral primary osteoarthritis, right knee: Secondary | ICD-10-CM | POA: Diagnosis not present

## 2015-03-14 DIAGNOSIS — M25551 Pain in right hip: Secondary | ICD-10-CM | POA: Diagnosis not present

## 2015-03-14 DIAGNOSIS — Z96652 Presence of left artificial knee joint: Secondary | ICD-10-CM | POA: Diagnosis not present

## 2015-03-14 DIAGNOSIS — M25552 Pain in left hip: Secondary | ICD-10-CM | POA: Diagnosis not present

## 2015-03-14 DIAGNOSIS — M25561 Pain in right knee: Secondary | ICD-10-CM | POA: Diagnosis not present

## 2015-05-09 DIAGNOSIS — H18891 Other specified disorders of cornea, right eye: Secondary | ICD-10-CM | POA: Diagnosis not present

## 2015-05-09 DIAGNOSIS — H40013 Open angle with borderline findings, low risk, bilateral: Secondary | ICD-10-CM | POA: Diagnosis not present

## 2015-05-09 DIAGNOSIS — H40033 Anatomical narrow angle, bilateral: Secondary | ICD-10-CM | POA: Diagnosis not present

## 2015-05-09 DIAGNOSIS — H5703 Miosis: Secondary | ICD-10-CM | POA: Diagnosis not present

## 2015-05-09 DIAGNOSIS — H2513 Age-related nuclear cataract, bilateral: Secondary | ICD-10-CM | POA: Diagnosis not present

## 2015-05-20 DIAGNOSIS — H2511 Age-related nuclear cataract, right eye: Secondary | ICD-10-CM | POA: Diagnosis not present

## 2015-05-23 ENCOUNTER — Ambulatory Visit: Payer: MEDICARE | Admitting: Emergency Medicine

## 2015-06-13 ENCOUNTER — Encounter: Payer: Self-pay | Admitting: Emergency Medicine

## 2015-06-13 ENCOUNTER — Ambulatory Visit: Payer: MEDICARE | Admitting: Emergency Medicine

## 2015-06-13 ENCOUNTER — Ambulatory Visit (INDEPENDENT_AMBULATORY_CARE_PROVIDER_SITE_OTHER): Payer: MEDICARE | Admitting: Emergency Medicine

## 2015-06-13 VITALS — BP 120/84 | HR 71 | Temp 97.9°F | Resp 16 | Ht 59.0 in | Wt 249.6 lb

## 2015-06-13 DIAGNOSIS — Z0000 Encounter for general adult medical examination without abnormal findings: Secondary | ICD-10-CM

## 2015-06-13 DIAGNOSIS — E785 Hyperlipidemia, unspecified: Secondary | ICD-10-CM

## 2015-06-13 DIAGNOSIS — I10 Essential (primary) hypertension: Secondary | ICD-10-CM

## 2015-06-13 DIAGNOSIS — Z1159 Encounter for screening for other viral diseases: Secondary | ICD-10-CM

## 2015-06-13 DIAGNOSIS — Z23 Encounter for immunization: Secondary | ICD-10-CM

## 2015-06-13 LAB — COMPLETE METABOLIC PANEL WITH GFR
ALT: 8 U/L (ref 6–29)
AST: 19 U/L (ref 10–35)
Albumin: 3.9 g/dL (ref 3.6–5.1)
Alkaline Phosphatase: 63 U/L (ref 33–130)
BUN: 13 mg/dL (ref 7–25)
CALCIUM: 10 mg/dL (ref 8.6–10.4)
CHLORIDE: 103 mmol/L (ref 98–110)
CO2: 29 mmol/L (ref 20–31)
CREATININE: 0.62 mg/dL (ref 0.60–0.93)
Glucose, Bld: 98 mg/dL (ref 65–99)
POTASSIUM: 4.6 mmol/L (ref 3.5–5.3)
Sodium: 142 mmol/L (ref 135–146)
Total Bilirubin: 0.4 mg/dL (ref 0.2–1.2)
Total Protein: 7.3 g/dL (ref 6.1–8.1)

## 2015-06-13 LAB — CBC WITH DIFFERENTIAL/PLATELET
BASOS PCT: 1 % (ref 0–1)
Basophils Absolute: 0.1 10*3/uL (ref 0.0–0.1)
EOS ABS: 0.4 10*3/uL (ref 0.0–0.7)
EOS PCT: 5 % (ref 0–5)
HCT: 37.1 % (ref 36.0–46.0)
Hemoglobin: 11.8 g/dL — ABNORMAL LOW (ref 12.0–15.0)
LYMPHS ABS: 2.7 10*3/uL (ref 0.7–4.0)
Lymphocytes Relative: 36 % (ref 12–46)
MCH: 28.9 pg (ref 26.0–34.0)
MCHC: 31.8 g/dL (ref 30.0–36.0)
MCV: 90.9 fL (ref 78.0–100.0)
MONOS PCT: 7 % (ref 3–12)
MPV: 10.3 fL (ref 8.6–12.4)
Monocytes Absolute: 0.5 10*3/uL (ref 0.1–1.0)
Neutro Abs: 3.8 10*3/uL (ref 1.7–7.7)
Neutrophils Relative %: 51 % (ref 43–77)
Platelets: 304 10*3/uL (ref 150–400)
RBC: 4.08 MIL/uL (ref 3.87–5.11)
RDW: 14.3 % (ref 11.5–15.5)
WBC: 7.4 10*3/uL (ref 4.0–10.5)

## 2015-06-13 LAB — HEPATITIS C ANTIBODY: HCV AB: NEGATIVE

## 2015-06-13 LAB — TSH: TSH: 0.85 m[IU]/L

## 2015-06-13 NOTE — Progress Notes (Addendum)
Patient ID: KESTREL MIS, female   DOB: 08-Feb-1941, 75 y.o.   MRN: [id number]    By signing my name below, I, [name], attest that this documentation has been prepared under the direction and in the presence of [name] Russian, MD Electronically Signed: Ladene Artist, ED Scribe 06/13/2015 at 3:10 PM.  Chief Complaint:  Chief Complaint  Patient presents with  . Annual Exam   HPI: [name] is a 75 y.o. female who reports to [facility] today for an annual exam. No h/o sleep apnea.   Rectal Bleeding Pt reports rectal bleeding a few weeks ago that she attributes to straining. She called the office and was advised to try stool softeners which she states provided significant relief. Pt denies rectal bleeding at this time.   Cardiology Pt is followed by Glenetta Hew, MD. She reports intermittent bilateral leg swelling and was advised to double her Lasix when this happens.   Cataracts  Pt had cataracts surgery of the R eye a few weeks ago. She is scheduled to have the L eye done on 07/01/15. Pt is followed by Clent Jacks, MD. No h/o corneal transplant.   Hip Pain Pt states that she plans to lose 30 lbs in order to get both of her hips replaced.   Preventative Maintenance  Pt has had her mammogram; normal. She was followed by Brien Few, MD for vaginal bleeding a few months ago that has resolved. No h/o CA. Pt's last colonoscopy was in 2007.   Pt plans to go to [place], [place] for her oldest granddaughter's college graduation.   Past Medical History  Diagnosis Date  . Anemia     on iron  . Hypertension   . Hyperlipidemia     on med  . Anxiety   . Arthritis     shoulders, knees, hands, hips; status post left knee surgery  . Depression   . H/O: pneumonia 2003  . Constipation   . GERD (gastroesophageal reflux disease)     TAKES TUMS  . Morbid obesity with BMI of 45.0-49.9, adult [facility])    Past Surgical History  Procedure Laterality Date  . Wisdom tooth extraction    . Svd       x 7  . Tubal ligation    . Knee surgery Left 11/2001  . Replacement total knee Left 10/2009  . Carpal tunnel release Left   . Colonoscopy    . Replacement total knee Left 10/2009  . Total shoulder replacement Left 01/19/2012  . Total shoulder arthroplasty  01/18/2012    Procedure: TOTAL SHOULDER ARTHROPLASTY;  Surgeon: Nita Sells, MD;  Location: [place];  Service: Orthopedics;  Laterality: Left;  . Shoulder open rotator cuff repair  03/01/2012    Procedure: ROTATOR CUFF REPAIR SHOULDER OPEN;  Surgeon: Nita Sells, MD;  Location: [place];  Service: Orthopedics;  Laterality: Left;  SUBSCAPULARIS REPAIR  . Transthoracic echocardiogram  06/2011    LV mildly dilated, EF=>55%; mild mitral annular calcif, mild MR; mild TR; AV mildly sclerotic, mild aortic regurg  . Stress test - adenosine thallium  1997    apical and inferior segments with questionable infarct and ischemia in anteroseptal & inferoseptal segments (Dr. Aileen Fass)  . Cardiac catheterization  04/20/1996    patent coronaries, EF 74% (Dr. Domenic Moras)   Social History   Social History  . Marital Status: Widowed    Spouse Name: N/A  . Number of Children: 7  . Years of Education:  N/A   Occupational History  .     Social History Main Topics  . Smoking status: Former Smoker -- 1.00 packs/day for 25 years    Types: Cigarettes    Quit date: 03/07/1984  . Smokeless tobacco: Former Systems developer  . Alcohol Use: No  . Drug Use: No  . Sexual Activity: No   Other Topics Concern  . None   Social History Narrative    She is a widowed mother of 53, grandmother of 86, great-grandmother of 59,   She does not drink alcohol or smoke cigarettes.   She previously tried exercise routinely but is not exercising as much lately. Her excuse is do to knee pain and back pain as well as cold weather.   Family History  Problem Relation Age of Onset  . Heart disease Mother   . Heart disease Father   . Heart disease Sister   .  Hypertension Sister   . Heart disease Sister   . Hypertension Sister   . Heart disease Sister   . Hypertension Sister    Allergies  Allergen Reactions  . Effexor [Venlafaxine Hcl]     Lip swelling patient advised to d/c  . Sulfa Drugs Cross Reactors Itching   Prior to Admission medications   Medication Sig Start Date End Date Taking? Authorizing Provider  aspirin EC 81 MG tablet Take 81 mg by mouth daily.    Historical Provider, MD  furosemide (LASIX) 20 MG tablet Take 1 tablet (20 mg total) by mouth daily. 08/01/14   [name] Russian, MD  HYDROcodone-acetaminophen (NORCO/VICODIN) 5-325 MG tablet Take 1 tablet by mouth every 8 (eight) hours as needed. for pain 01/24/15   Historical Provider, MD  IRON PO Take 55 mg by mouth daily with breakfast. Per pt report    Historical Provider, MD  lisinopril (PRINIVIL,ZESTRIL) 20 MG tablet TAKE 1 TABLET (20 MG TOTAL) BY MOUTH DAILY. 08/01/14   [name] Russian, MD  LORazepam (ATIVAN) 0.5 MG tablet TAKE ONE TABLET BY MOUTH EVERY 8 HOURS FOR ANXIETY 08/01/14   [name] Russian, MD  Phenylephrine-Cocoa Butter (PREPARATION H) 0.25-88.44 % SUPP Place rectally as needed.    Historical Provider, MD  pravastatin (PRAVACHOL) 40 MG tablet Take 1 tablet (40 mg total) by mouth 2 (two) times daily. 02/04/15   [name] Russian, MD   ROS: The patient denies fevers, chills, night sweats, unintentional weight loss, chest pain, palpitations, wheezing, dyspnea on exertion, nausea, vomiting, abdominal pain, dysuria, hematuria, melena, numbness, weakness, or tingling. +arthralgias, +leg swelling   All other systems have been reviewed and were otherwise negative with the exception of those mentioned in the HPI and as above.    PHYSICAL EXAM: Filed Vitals:   06/13/15 1427  BP: 120/84  Pulse: 71  Temp: 97.9 F (36.6 C)  Resp: 16   Body mass index is 50.39 kg/(m^2).  General: Alert, no acute distress HEENT:  Normocephalic, atraumatic, oropharynx patent. Eye: E[facility], [facility].  Inflammatory changes around the cornea of the R eye.  Cardiovascular: Regular rate and rhythm, no rubs murmurs or gallops. No Carotid bruits, radial pulse intact. No pedal edema.  Respiratory: Clear to auscultation bilaterally. No wheezes, rales, or rhonchi. No cyanosis, no use of accessory musculature Chest: Normal breast exam.  Abdominal: No organomegaly, abdomen is soft and non-tender, positive bowel sounds. No masses. Obese Musculoskeletal: Gait intact. Significant edema of both legs, L greater than R.  Skin: No rashes. Neurologic: Facial musculature symmetric. Psychiatric: Patient acts appropriately throughout  our interaction. Lymphatic: No cervical or submandibular lymphadenopathy  LABS: Results for orders placed or performed in visit on 12/06/14  CBC with Differential/Platelet  Result Value Ref Range   WBC 7.5 4.0 - 10.5 K/uL   RBC 4.11 3.87 - 5.11 MIL/uL   Hemoglobin 12.3 12.0 - 15.0 g/dL   HCT 38.2 36.0 - 46.0 %   MCV 92.9 78.0 - 100.0 fL   MCH 29.9 26.0 - 34.0 pg   MCHC 32.2 30.0 - 36.0 g/dL   RDW 14.1 11.5 - 15.5 %   Platelets 330 150 - 400 K/uL   MPV 9.9 8.6 - 12.4 fL   Neutrophils Relative % 58 43 - 77 %   Neutro Abs 4.4 1.7 - 7.7 K/uL   Lymphocytes Relative 33 12 - 46 %   Lymphs Abs 2.5 0.7 - 4.0 K/uL   Monocytes Relative 7 3 - 12 %   Monocytes Absolute 0.5 0.1 - 1.0 K/uL   Eosinophils Relative 2 0 - 5 %   Eosinophils Absolute 0.2 0.0 - 0.7 K/uL   Basophils Relative 0 0 - 1 %   Basophils Absolute 0.0 0.0 - 0.1 K/uL   Smear Review Criteria for review not met   COMPLETE METABOLIC PANEL WITH GFR  Result Value Ref Range   Sodium 143 135 - 146 mmol/L   Potassium 4.9 3.5 - 5.3 mmol/L   Chloride 102 98 - 110 mmol/L   CO2 29 20 - 31 mmol/L   Glucose, Bld 84 65 - 99 mg/dL   BUN 14 7 - 25 mg/dL   Creat 0.68 0.60 - 0.93 mg/dL   Total Bilirubin 0.5 0.2 - 1.2 mg/dL   Alkaline Phosphatase 58 33 - 130 U/L   AST 17 10 - 35 U/L   ALT 10 6 - 29 U/L   Total Protein 7.0 6.1  - 8.1 g/dL   Albumin 4.0 3.6 - 5.1 g/dL   Calcium 9.9 8.6 - 10.4 mg/dL   GFR, Est African American >89 >=60 mL/min   GFR, Est Non African American 88 >=60 mL/min  Lipid panel  Result Value Ref Range   Cholesterol 198 125 - 200 mg/dL   Triglycerides 80 <150 mg/dL   HDL 83 >=46 mg/dL   Total CHOL/HDL Ratio 2.4 <=5.0 Ratio   VLDL 16 <30 mg/dL   LDL Cholesterol 99 <130 mg/dL  IFOBT POC (occult bld, rslt in office)  Result Value Ref Range   IFOBT Negative    EKG/XRAY:   Primary read interpreted by Dr. Everlene Farrier at [facility].  ASSESSMENT/PLAN:  her main issue continues to be weighed. I called Dr. Kathline Magic office they are going to schedule her for colonoscopy. She was given a tetanus shot today. She is hoping to have hip surgery but has been instructed to lose 20 pounds prior to having that surgery.I personally performed the services described in this documentation, which was scribed in my presence. The recorded information has been reviewed and is accurate.    Gross sideeffects, risk and benefits, and alternatives of medications d/w patient. Patient is aware that all medications have potential sideeffects and we are unable to predict every sideeffect or drug-drug interaction that may occur.  Arlyss Queen MD 06/13/2015 2:46 PM

## 2015-06-25 DIAGNOSIS — H2512 Age-related nuclear cataract, left eye: Secondary | ICD-10-CM | POA: Diagnosis not present

## 2015-07-01 DIAGNOSIS — H2512 Age-related nuclear cataract, left eye: Secondary | ICD-10-CM | POA: Diagnosis not present

## 2015-07-30 ENCOUNTER — Ambulatory Visit (INDEPENDENT_AMBULATORY_CARE_PROVIDER_SITE_OTHER): Payer: MEDICARE | Admitting: Cardiology

## 2015-07-30 VITALS — BP 128/78 | HR 66 | Ht 60.0 in | Wt 234.6 lb

## 2015-07-30 DIAGNOSIS — E785 Hyperlipidemia, unspecified: Secondary | ICD-10-CM | POA: Diagnosis not present

## 2015-07-30 DIAGNOSIS — E8881 Metabolic syndrome: Secondary | ICD-10-CM

## 2015-07-30 DIAGNOSIS — I10 Essential (primary) hypertension: Secondary | ICD-10-CM

## 2015-07-30 NOTE — Progress Notes (Signed)
PCP: [name] Edin, MD  Clinic Note: Chief Complaint  Patient presents with  . Follow-up    6 Months, pt denied chest pain and SOB  . Hypertension    HPI: [name] is a 75 y.o. female with a PMH below who presents today for 6 month f/u - HTN, HLD.  [name] was last seen in Oct 2016 - for pre-op evaluation for upcoming Hip Sgx, but still not done.   Recent Hospitalizations: She did not yet have her knee surgery, stating that the surgeon indicated her weight was still too high.  Studies Reviewed: None  Interval History: [name] is doing pretty well today. She remains active although she had to step down from her [employer] position, she is still very busy with church. She has now started doing a diet called the "[place] Fast" -- and indicates that she has lost some weight with doing so. She is hoping to try to go down to the target weight which will allow her to then have her knee surgery. She is pretty stable from a cardiac standpoint without any sensation of any rapid irregular heartbeats palpitations or dyspnea beyond her baseline exertional dyspnea. No heart failure symptoms PND, or orthopnea, but he does have some swelling in the ankle below her sore knee. He denies any chest tightness pressure with rest or exertion.   No dizziness, weakness or syncope/near syncope. No TIA/amaurosis fugax symptoms.   ROS: A comprehensive was performed. Review of Systems  Constitutional: Positive for weight loss (On [name] with church). Negative for malaise/fatigue.  HENT: Negative for congestion and nosebleeds.   Respiratory: Negative for cough, shortness of breath and wheezing.   Cardiovascular: Negative for claudication.       Per history of present illness  Gastrointestinal: Negative for blood in stool and melena.  Genitourinary: Negative for hematuria.  Musculoskeletal: Positive for back pain and joint pain (L & R knee & Hip pain.). Negative for myalgias and falls.   Neurological: Negative for dizziness and headaches.  Endo/Heme/Allergies: Does not bruise/bleed easily.  Psychiatric/Behavioral: Negative for depression and memory loss. The patient does not have insomnia.   All other systems reviewed and are negative.   Past Medical History  Diagnosis Date  . Anemia     on iron  . Hypertension   . Hyperlipidemia     on med  . Anxiety   . Arthritis     shoulders, knees, hands, hips; status post left knee surgery  . Depression   . H/O: pneumonia 2003  . Constipation   . GERD (gastroesophageal reflux disease)     TAKES TUMS  . Morbid obesity with BMI of 45.0-49.9, adult [facility])     Past Surgical History  Procedure Laterality Date  . Wisdom tooth extraction    . Svd      x 7  . Tubal ligation    . Knee surgery Left 11/2001  . Replacement total knee Left 10/2009  . Carpal tunnel release Left   . Colonoscopy    . Replacement total knee Left 10/2009  . Total shoulder replacement Left 01/19/2012  . Total shoulder arthroplasty  01/18/2012    Procedure: TOTAL SHOULDER ARTHROPLASTY;  Surgeon: Mable Paris, MD;  Location: [facility] OR;  Service: Orthopedics;  Laterality: Left;  . Shoulder open rotator cuff repair  03/01/2012    Procedure: ROTATOR CUFF REPAIR SHOULDER OPEN;  Surgeon: Mable Paris, MD;  [facility];  Service: Orthopedics;  Laterality:  Left;  SUBSCAPULARIS REPAIR  . Transthoracic echocardiogram  06/2011    LV mildly dilated, EF=>55%; mild mitral annular calcif, mild MR; mild TR; AV mildly sclerotic, mild aortic regurg  . Stress test - adenosine thallium  1997    apical and inferior segments with questionable infarct and ischemia in anteroseptal & inferoseptal segments (Dr. Standley Brooking)  . Cardiac catheterization  04/20/1996    patent coronaries, EF 74% (Dr. Aram Candela)    Allergies  Allergen Reactions  . Effexor [Venlafaxine Hcl]     Lip swelling patient advised to d/c  . Sulfa Drugs Cross Reactors Itching   Prior  to Admission medications   Medication Sig Start Date End Date Taking? Authorizing Provider  aspirin EC 81 MG tablet Take 81 mg by mouth daily.   Yes Historical Provider, MD  furosemide (LASIX) 20 MG tablet Take 1 tablet (20 mg total) by mouth daily. 08/01/14  Yes Collene Gobble, MD  IRON PO Take 55 mg by mouth daily with breakfast. Per pt report   Yes Historical Provider, MD  lisinopril (PRINIVIL,ZESTRIL) 20 MG tablet TAKE 1 TABLET (20 MG TOTAL) BY MOUTH DAILY. 08/01/14  Yes Collene Gobble, MD  LORazepam (ATIVAN) 0.5 MG tablet TAKE ONE TABLET BY MOUTH EVERY 8 HOURS FOR ANXIETY 08/01/14  Yes Collene Gobble, MD  Phenylephrine-Cocoa Butter (PREPARATION H) 0.25-88.44 % SUPP Place rectally as needed.   Yes Historical Provider, MD  pravastatin (PRAVACHOL) 40 MG tablet Take 1 tablet (40 mg total) by mouth 2 (two) times daily. 02/04/15  Yes Collene Gobble, MD    Social / Family History   Social History  . Marital Status: Widowed    Spouse Name: N/A  . Number of Children: 7  . Years of Education: N/A   Occupational History  .     Social History Main Topics  . Smoking status: Former Smoker -- 1.00 packs/day for 25 years    Types: Cigarettes    Quit date: 03/07/1984  . Smokeless tobacco: Former Neurosurgeon  . Alcohol Use: No  . Drug Use: No  . Sexual Activity: No   Other Topics Concern  . Not on file   Social History Narrative    She is a widowed mother of 7, grandmother of 5, great-grandmother of 6,   She does not drink alcohol or smoke cigarettes.   She previously tried exercise routinely but is not exercising as much lately. Her excuse is do to knee pain and back pain as well as cold weather.   family history includes Heart disease in her father, mother, sister, sister, and sister; Hypertension in her sister, sister, and sister.   Wt Readings from Last 3 Encounters:  07/30/15 234 lb 9.6 oz (106.414 kg)  06/13/15 249 lb 9.6 oz (113.218 kg)  02/01/15 242 lb 12.8 oz (110.133 kg)    PHYSICAL  EXAM BP 128/78 mmHg  Pulse 66  Ht 5' (1.524 m)  Wt 234 lb 9.6 oz (106.414 kg)  BMI 45.82 kg/m2 General appearance: alert, cooperative, appears stated age, no distress, morbidly obese and Answers questions appropriately. Pleasant mood with normal affect. Neck: no adenopathy, no carotid bruit, no JVD, supple, symmetrical, trachea midline and thyroid not enlarged, symmetric, no tenderness/mass/nodules Lungs: clear to auscultation bilaterally, normal percussion bilaterally and Nonlabored, good air movement Heart: RRR, normal S1/S2. No R./G. with soft 1/6 C-D SEM at RUSB; nondisplaced PMI Abdomen: soft, non-tender; bowel sounds normal; no masses, no organomegaly and Morbidly obese; really unable to palpate  at this am due to body habitus Extremities: Swollen knees bilaterally with soft puffy obese legs that do not appear to have pitting edema. Pulses: 2+ and symmetric Palpable 1-2+ bilateral lower extremity Neurologic: Alert and oriented X 3, normal strength and tone. Normal symmetric reflexes.     Adult ECG Report  Rate: 66 ;  Rhythm: normal sinus rhythm and Nonspecific ST and T-wave changes. Otherwise normal axis, intervals and durations.;   Narrative Interpretation: Stable/normal EKG.   Other studies Reviewed: Additional studies/ records that were reviewed today include:  Recent Labs:   Lab Results  Component Value Date   CHOL 198 12/06/2014   HDL 83 12/06/2014   LDLCALC 99 12/06/2014   TRIG 80 12/06/2014   CHOLHDL 2.4 12/06/2014     ASSESSMENT / PLAN: Problem List Items Addressed This Visit    Morbid obesity - BMI 46 (Chronic)    Currently on a new diet. She will looks like he has lost some weight since March. She is trying to increase her activity level on and staying active, but she is deathly made a conscientious changes to her diet. I just ensured that she stays healthy with her diet and has adequate nutrition. He really needs to have her knee operated on so she can get back  into doing exercise. We again talked about water aerobics or water walking.      Metabolic syndrome (Chronic)    With morbid obesity, and hypertension treating her dyslipidemia is very important. Blood pressure is well-controlled and triglycerides/HDL or relatively normal. Continue to work on weight loss.      Hyperlipidemia with target LDL less than 100 - Primary (Chronic)    Last check was in August 2016. Using monitored by PCP. On pravastatin. Labs are pretty much at goal for her risk factors.      Relevant Orders   EKG 12-Lead (Completed)   Essential hypertension (Chronic)    Excellent control on current medications. Her plus low-dose standing Lasix. No additional Lasix use. Continue to monitor, stable      Relevant Orders   EKG 12-Lead (Completed)      Current medicines are reviewed at length with the patient today. (+/- concerns) none The following changes have been made: none  Studies Ordered:   Orders Placed This Encounter  Procedures  . EKG 12-Lead   Follow-up in one year   Abhi Moccia, Piedad ClimesAVID W, M.D., M.S. Interventional Cardiologist   Pager # [phone] Phone # [phone] [address] [place], [place] [address]

## 2015-07-30 NOTE — Patient Instructions (Signed)
NO CHANGE IN CURRENT MEDICATIONS  Your physician wants you to follow-up in 12 MONTHS WITH DR HARDING.  You will receive a reminder letter in the mail two months in advance. If you don't receive a letter, please call our office to schedule the follow-up appointment.  If you need a refill on your cardiac medications before your next appointment, please call your pharmacy.   

## 2015-08-01 ENCOUNTER — Encounter: Payer: Self-pay | Admitting: Cardiology

## 2015-08-01 NOTE — Assessment & Plan Note (Signed)
With morbid obesity, and hypertension treating her dyslipidemia is very important. Blood pressure is well-controlled and triglycerides/HDL or relatively normal. Continue to work on weight loss.

## 2015-08-01 NOTE — Assessment & Plan Note (Signed)
Currently on a new diet. She will looks like he has lost some weight since March. She is trying to increase her activity level on and staying active, but she is deathly made a conscientious changes to her diet. I just ensured that she stays healthy with her diet and has adequate nutrition. He really needs to have her knee operated on so she can get back into doing exercise. We again talked about water aerobics or water walking.

## 2015-08-01 NOTE — Assessment & Plan Note (Signed)
Excellent control on current medications. Her plus low-dose standing Lasix. No additional Lasix use. Continue to monitor, stable

## 2015-08-01 NOTE — Assessment & Plan Note (Signed)
Last check was in August 2016. Using monitored by PCP. On pravastatin. Labs are pretty much at goal for her risk factors.

## 2015-08-12 ENCOUNTER — Other Ambulatory Visit: Payer: Self-pay | Admitting: Emergency Medicine

## 2015-08-28 DIAGNOSIS — Z1159 Encounter for screening for other viral diseases: Secondary | ICD-10-CM | POA: Diagnosis not present

## 2015-08-28 DIAGNOSIS — Z0000 Encounter for general adult medical examination without abnormal findings: Secondary | ICD-10-CM | POA: Diagnosis not present

## 2015-08-28 DIAGNOSIS — Z23 Encounter for immunization: Secondary | ICD-10-CM | POA: Diagnosis not present

## 2015-08-28 DIAGNOSIS — E785 Hyperlipidemia, unspecified: Secondary | ICD-10-CM | POA: Diagnosis not present

## 2015-08-28 DIAGNOSIS — I10 Essential (primary) hypertension: Secondary | ICD-10-CM | POA: Diagnosis not present

## 2015-09-23 DIAGNOSIS — M1611 Unilateral primary osteoarthritis, right hip: Secondary | ICD-10-CM | POA: Diagnosis not present

## 2015-09-23 DIAGNOSIS — M1612 Unilateral primary osteoarthritis, left hip: Secondary | ICD-10-CM | POA: Diagnosis not present

## 2015-09-23 DIAGNOSIS — M7061 Trochanteric bursitis, right hip: Secondary | ICD-10-CM | POA: Diagnosis not present

## 2015-09-23 DIAGNOSIS — M7062 Trochanteric bursitis, left hip: Secondary | ICD-10-CM | POA: Diagnosis not present

## 2015-09-24 DIAGNOSIS — M7061 Trochanteric bursitis, right hip: Secondary | ICD-10-CM | POA: Diagnosis not present

## 2015-09-24 DIAGNOSIS — M1611 Unilateral primary osteoarthritis, right hip: Secondary | ICD-10-CM | POA: Diagnosis not present

## 2015-09-24 DIAGNOSIS — M7062 Trochanteric bursitis, left hip: Secondary | ICD-10-CM | POA: Diagnosis not present

## 2015-09-24 DIAGNOSIS — M1612 Unilateral primary osteoarthritis, left hip: Secondary | ICD-10-CM | POA: Diagnosis not present

## 2015-10-08 DIAGNOSIS — M1612 Unilateral primary osteoarthritis, left hip: Secondary | ICD-10-CM | POA: Diagnosis not present

## 2015-10-08 DIAGNOSIS — M1611 Unilateral primary osteoarthritis, right hip: Secondary | ICD-10-CM | POA: Diagnosis not present

## 2015-10-08 DIAGNOSIS — M1711 Unilateral primary osteoarthritis, right knee: Secondary | ICD-10-CM | POA: Diagnosis not present

## 2015-10-21 DIAGNOSIS — H17821 Peripheral opacity of cornea, right eye: Secondary | ICD-10-CM | POA: Diagnosis not present

## 2015-10-21 DIAGNOSIS — Z961 Presence of intraocular lens: Secondary | ICD-10-CM | POA: Diagnosis not present

## 2015-10-21 DIAGNOSIS — H18461 Peripheral corneal degeneration, right eye: Secondary | ICD-10-CM | POA: Diagnosis not present

## 2015-10-22 ENCOUNTER — Encounter: Payer: Self-pay | Admitting: Family Medicine

## 2015-10-22 ENCOUNTER — Ambulatory Visit (INDEPENDENT_AMBULATORY_CARE_PROVIDER_SITE_OTHER): Payer: MEDICARE | Admitting: Family Medicine

## 2015-10-22 VITALS — BP 110/68 | HR 66 | Temp 97.7°F | Resp 16 | Ht 60.0 in | Wt 235.4 lb

## 2015-10-22 DIAGNOSIS — M1612 Unilateral primary osteoarthritis, left hip: Secondary | ICD-10-CM

## 2015-10-22 DIAGNOSIS — E8881 Metabolic syndrome: Secondary | ICD-10-CM

## 2015-10-22 DIAGNOSIS — M1611 Unilateral primary osteoarthritis, right hip: Secondary | ICD-10-CM

## 2015-10-22 DIAGNOSIS — K219 Gastro-esophageal reflux disease without esophagitis: Secondary | ICD-10-CM | POA: Diagnosis not present

## 2015-10-22 DIAGNOSIS — R600 Localized edema: Secondary | ICD-10-CM | POA: Diagnosis not present

## 2015-10-22 DIAGNOSIS — F411 Generalized anxiety disorder: Secondary | ICD-10-CM | POA: Diagnosis not present

## 2015-10-22 DIAGNOSIS — E7800 Pure hypercholesterolemia, unspecified: Secondary | ICD-10-CM | POA: Diagnosis not present

## 2015-10-22 DIAGNOSIS — I10 Essential (primary) hypertension: Secondary | ICD-10-CM

## 2015-10-22 LAB — CBC WITH DIFFERENTIAL/PLATELET
BASOS ABS: 81 {cells}/uL (ref 0–200)
Basophils Relative: 1 %
Eosinophils Absolute: 243 cells/uL (ref 15–500)
Eosinophils Relative: 3 %
HEMATOCRIT: 37.8 % (ref 35.0–45.0)
HEMOGLOBIN: 12.2 g/dL (ref 11.7–15.5)
LYMPHS ABS: 2592 {cells}/uL (ref 850–3900)
LYMPHS PCT: 32 %
MCH: 29.5 pg (ref 27.0–33.0)
MCHC: 32.3 g/dL (ref 32.0–36.0)
MCV: 91.5 fL (ref 80.0–100.0)
MPV: 10 fL (ref 7.5–12.5)
Monocytes Absolute: 648 cells/uL (ref 200–950)
Monocytes Relative: 8 %
Neutro Abs: 4536 cells/uL (ref 1500–7800)
Neutrophils Relative %: 56 %
Platelets: 329 10*3/uL (ref 140–400)
RBC: 4.13 MIL/uL (ref 3.80–5.10)
RDW: 15 % (ref 11.0–15.0)
WBC: 8.1 10*3/uL (ref 3.8–10.8)

## 2015-10-22 LAB — LIPID PANEL
CHOLESTEROL: 210 mg/dL — AB (ref 125–200)
HDL: 101 mg/dL (ref >=46)
LDL Cholesterol: 96 mg/dL (ref <130)
TRIGLYCERIDES: 66 mg/dL (ref <150)
Total CHOL/HDL Ratio: 2.1 Ratio (ref <=5.0)
VLDL: 13 mg/dL (ref <30)

## 2015-10-22 LAB — COMPREHENSIVE METABOLIC PANEL
ALBUMIN: 4 g/dL (ref 3.6–5.1)
ALK PHOS: 63 U/L (ref 33–130)
ALT: 12 U/L (ref 6–29)
AST: 19 U/L (ref 10–35)
BILIRUBIN TOTAL: 0.5 mg/dL (ref 0.2–1.2)
BUN: 18 mg/dL (ref 7–25)
CO2: 28 mmol/L (ref 20–31)
CREATININE: 0.66 mg/dL (ref 0.60–0.93)
Calcium: 10.1 mg/dL (ref 8.6–10.4)
Chloride: 102 mmol/L (ref 98–110)
Glucose, Bld: 83 mg/dL (ref 65–99)
Potassium: 4.5 mmol/L (ref 3.5–5.3)
Sodium: 140 mmol/L (ref 135–146)
Total Protein: 7.2 g/dL (ref 6.1–8.1)

## 2015-10-22 LAB — POCT URINALYSIS DIP (MANUAL ENTRY)
Bilirubin, UA: NEGATIVE
Blood, UA: NEGATIVE
GLUCOSE UA: NEGATIVE
Ketones, POC UA: NEGATIVE
NITRITE UA: NEGATIVE
Protein Ur, POC: NEGATIVE
Spec Grav, UA: <=1.005
UROBILINOGEN UA: 0.2
pH, UA: 7.5

## 2015-10-22 NOTE — Progress Notes (Signed)
Subjective:    Patient ID: [name], female    DOB: 04/09/1941, 75 y.o.   MRN: [id number]  10/22/2015  Establish Care (former patient of Dr Cleta Alberts)   HPI This 75 y.o. female presents for to establish care.  Last physical:  06-13-2015 Pap smear: Mammogram: 10/30/2014; not scheduled. Colonoscopy:  Next Thursday with Schooler.   Bone density:  Graves order bone density scan.   TDAP:  2017 Pneumovax: Pneumovax 2010; Prevnar 13 2016 Zostavax:  2015  [facility] Influenza:  11/2014 Eye exam: Dental exam:  B hip OA: to schedule B THR; must lose more weight; must lose 20 more pounds.    HTN: Patient reports good compliance with medication, good tolerance to medication, and good symptom control.    Hyperlipidemia: Patient reports good compliance with medication, good tolerance to medication, and good symptom control.    Anxiety: taking Lorazepam one daily; vacation bible school at church.    Seeing Dr. Cleta Alberts every six months.     Review of Systems  Constitutional: Negative for fever, chills, diaphoresis and fatigue.  Eyes: Negative for visual disturbance.  Respiratory: Negative for cough and shortness of breath.   Cardiovascular: Negative for chest pain, palpitations and leg swelling.  Gastrointestinal: Negative for nausea, vomiting, abdominal pain, diarrhea and constipation.  Endocrine: Negative for cold intolerance, heat intolerance, polydipsia, polyphagia and polyuria.  Neurological: Negative for dizziness, tremors, seizures, syncope, facial asymmetry, speech difficulty, weakness, light-headedness, numbness and headaches.    Past Medical History:  Diagnosis Date  . Anemia    on iron  . Anxiety   . Arthritis    shoulders, knees, hands, hips; status post left knee surgery  . Constipation   . Depression   . GERD (gastroesophageal reflux disease)    TAKES TUMS  . H/O: pneumonia 2003  . Hyperlipidemia    on med  . Hypertension   . Morbid obesity with BMI of  45.0-49.9, adult [facility])    Past Surgical History:  Procedure Laterality Date  . CARDIAC CATHETERIZATION  04/20/1996   patent coronaries, EF 74% (Dr. Aram Candela)  . CARPAL TUNNEL RELEASE Left   . COLONOSCOPY    . KNEE SURGERY Left 11/2001  . REPLACEMENT TOTAL KNEE Left 10/2009  . REPLACEMENT TOTAL KNEE Left 10/2009  . SHOULDER OPEN ROTATOR CUFF REPAIR  03/01/2012   Procedure: ROTATOR CUFF REPAIR SHOULDER OPEN;  Surgeon: Mable Paris, MD;  Location: [facility] OR;  Service: Orthopedics;  Laterality: Left;  SUBSCAPULARIS REPAIR  . STRESS TEST - ADENOSINE THALLIUM  1997   apical and inferior segments with questionable infarct and ischemia in anteroseptal & inferoseptal segments (Dr. Standley Brooking)  . SVD     x 7  . TOTAL SHOULDER ARTHROPLASTY  01/18/2012   Procedure: TOTAL SHOULDER ARTHROPLASTY;  Surgeon: Mable Paris, MD;  Location: [facility] OR;  Service: Orthopedics;  Laterality: Left;  . TOTAL SHOULDER REPLACEMENT Left 01/19/2012  . TRANSTHORACIC ECHOCARDIOGRAM  06/2011   LV mildly dilated, EF=>55%; mild mitral annular calcif, mild MR; mild TR; AV mildly sclerotic, mild aortic regurg  . TUBAL LIGATION    . WISDOM TOOTH EXTRACTION     Allergies  Allergen Reactions  . Effexor [Venlafaxine Hcl]     Lip swelling patient advised to d/c  . Sulfa Drugs Cross Reactors Itching   Current Outpatient Prescriptions  Medication Sig Dispense Refill  . aspirin EC 81 MG tablet Take 81 mg by mouth daily.    . furosemide (LASIX) 20  MG tablet Take 1 tablet (20 mg total) by mouth daily. 90 tablet 3  . IRON PO Take 55 mg by mouth daily with breakfast. Per pt report    . lisinopril (PRINIVIL,ZESTRIL) 20 MG tablet TAKE 1 TABLET BY MOUTH EVERY DAY 90 tablet 0  . LORazepam (ATIVAN) 0.5 MG tablet TAKE ONE TABLET BY MOUTH EVERY 8 HOURS FOR ANXIETY 30 tablet 5  . Phenylephrine-Cocoa Butter (PREPARATION H) 0.25-88.44 % SUPP Place rectally as needed.    . pravastatin (PRAVACHOL) 40 MG tablet Take 1 tablet (40 mg  total) by mouth 2 (two) times daily. 60 tablet 5   No current facility-administered medications for this visit.    Social History   Social History  . Marital status: Widowed    Spouse name: N/A  . Number of children: 7  . Years of education: N/A   Occupational History  . retired Retired   Social History Main Topics  . Smoking status: Former Smoker    Packs/day: 1.00    Years: 25.00    Types: Cigarettes    Quit date: 03/07/1984  . Smokeless tobacco: Former NeurosurgeonUser  . Alcohol use No  . Drug use: No  . Sexual activity: No   Other Topics Concern  . Not on file   Social History Narrative   Marital status:   She is a widowed since 2009; married 45 years      Children:   mother of 657, grandmother of 5, great-grandmother of 6.      Lives: alone with home; daughter, son lives with patient      Tobacco: quit smoking 34 years ago      Alcohol: none      ADLs:  Walks with cane; drives.    She does not drink alcohol or smoke cigarettes.   She previously tried exercise routinely but is not exercising as much lately. Her excuse is do to knee pain and back pain as well as cold weather.   Family History  Problem Relation Age of Onset  . Heart disease Mother   . Heart disease Father   . Heart disease Sister   . Hypertension Sister   . Heart disease Sister   . Hypertension Sister   . Heart disease Sister   . Hypertension Sister        Objective:    BP 110/68   Pulse 66   Temp 97.7 F (36.5 C) (Oral)   Resp 16   Ht 5' (1.524 m)   Wt 235 lb 6.4 oz (106.8 kg)   SpO2 95%   BMI 45.97 kg/m  Physical Exam  Constitutional: She is oriented to person, place, and time. She appears well-developed and well-nourished. No distress.  HENT:  Head: Normocephalic and atraumatic.  Right Ear: External ear normal.  Left Ear: External ear normal.  Nose: Nose normal.  Mouth/Throat: Oropharynx is clear and moist.  Eyes: Conjunctivae and EOM are normal. Pupils are equal, round, and reactive to  light.  Neck: Normal range of motion. Neck supple. Carotid bruit is not present. No thyromegaly present.  Cardiovascular: Normal rate, regular rhythm, normal heart sounds and intact distal pulses.  Exam reveals no gallop and no friction rub.   No murmur heard. Pulmonary/Chest: Effort normal and breath sounds normal. She has no wheezes. She has no rales.  Abdominal: Soft. Bowel sounds are normal. She exhibits no distension and no mass. There is no tenderness. There is no rebound and no guarding.  Lymphadenopathy:  She has no cervical adenopathy.  Neurological: She is alert and oriented to person, place, and time. No cranial nerve deficit.  Skin: Skin is warm and dry. No rash noted. She is not diaphoretic. No erythema. No pallor.  Psychiatric: She has a normal mood and affect. Her behavior is normal.        Assessment & Plan:   1. Pure hypercholesterolemia   2. Essential hypertension, benign   3. Gastroesophageal reflux disease without esophagitis   4. Hyperlipidemia with target LDL less than 100   5. Morbid obesity due to excess calories (HCC)   6. Bilateral edema of lower extremity   7. Metabolic syndrome   8. Primary osteoarthritis of left hip   9. Primary osteoarthritis of right hip   10. Anxiety state    -stable; obtain labs; continue current medications. -must lose 20 more pounds to undergo hip replacement.   Orders Placed This Encounter  Procedures  . CBC with Differential/Platelet  . Comprehensive metabolic panel    Order Specific Question:   Has the patient fasted?    Answer:   Yes  . Lipid panel    Order Specific Question:   Has the patient fasted?    Answer:   Yes  . POCT urinalysis dipstick   No orders of the defined types were placed in this encounter.   Return in about 6 months (around 04/23/2016) for recheck high blood pressure, high cholesterol.    Sindi Beckworth Paulita Fujita, M.D. [facility] [address] [place], [place]   [phone] phone [phone] fax

## 2015-10-22 NOTE — Patient Instructions (Addendum)
   IF you received an x-ray today, you will receive an invoice from [facility]. Please contact [facility] at [phone] with questions or concerns regarding your invoice.   IF you received labwork today, you will receive an invoice from [facility]. Please contact [facility] at [phone] with questions or concerns regarding your invoice.   Our billing staff will not be able to assist you with questions regarding bills from these companies.  You will be contacted with the lab results as soon as they are available. The fastest way to get your results is to activate your My Chart account. Instructions are located on the last page of this paperwork. If you have not heard from us regarding the results in 2 weeks, please contact this office.       Why follow it? Research shows. . Those who follow the Mediterranean diet have a reduced risk of heart disease  . The diet is associated with a reduced incidence of Parkinson's and Alzheimer's diseases . People following the diet may have longer life expectancies and lower rates of chronic diseases  . The Dietary Guidelines for Americans recommends the Mediterranean diet as an eating plan to promote health and prevent disease  What Is the Mediterranean Diet?  . Healthy eating plan based on typical foods and recipes of Mediterranean-style cooking . The diet is primarily a plant based diet; these foods should make up a majority of meals   Starches - Plant based foods should make up a majority of meals - They are an important sources of vitamins, minerals, energy, antioxidants, and fiber - Choose whole grains, foods high in fiber and minimally processed items  - Typical grain sources include wheat, oats, barley, corn, brown rice, bulgar, farro, millet, polenta, couscous  - Various types of beans include chickpeas, lentils, fava beans, black beans, white beans   Fruits  Veggies - Large quantities of  antioxidant rich fruits & veggies; 6 or more servings  - Vegetables can be eaten raw or lightly drizzled with oil and cooked  - Vegetables common to the traditional Mediterranean Diet include: artichokes, arugula, beets, broccoli, brussel sprouts, cabbage, carrots, celery, collard greens, cucumbers, eggplant, kale, leeks, lemons, lettuce, mushrooms, okra, onions, peas, peppers, potatoes, pumpkin, radishes, rutabaga, shallots, spinach, sweet potatoes, turnips, zucchini - Fruits common to the Mediterranean Diet include: apples, apricots, avocados, cherries, clementines, dates, figs, grapefruits, grapes, melons, nectarines, oranges, peaches, pears, pomegranates, strawberries, tangerines  Fats - Replace butter and margarine with healthy oils, such as olive oil, canola oil, and tahini  - Limit nuts to no more than a handful a day  - Nuts include walnuts, almonds, pecans, pistachios, pine nuts  - Limit or avoid candied, honey roasted or heavily salted nuts - Olives are central to the Mediterranean diet - can be eaten whole or used in a variety of dishes   Meats Protein - Limiting red meat: no more than a few times a month - When eating red meat: choose lean cuts and keep the portion to the size of deck of cards - Eggs: approx. 0 to 4 times a week  - Fish and lean poultry: at least 2 a week  - Healthy protein sources include, chicken, turkey, lean beef, lamb - Increase intake of seafood such as tuna, salmon, trout, mackerel, shrimp, scallops - Avoid or limit high fat processed meats such as sausage and bacon  Dairy - Include moderate amounts of low fat dairy products  - Focus on healthy   dairy such as fat free yogurt, skim milk, low or reduced fat cheese - Limit dairy products higher in fat such as whole or 2% milk, cheese, ice cream  Alcohol - Moderate amounts of red wine is ok  - No more than 5 oz daily for women (all ages) and men older than age 65  - No more than 10 oz of wine daily for men younger  than 65  Other - Limit sweets and other desserts  - Use herbs and spices instead of salt to flavor foods  - Herbs and spices common to the traditional Mediterranean Diet include: basil, bay leaves, chives, cloves, cumin, fennel, garlic, lavender, marjoram, mint, oregano, parsley, pepper, rosemary, sage, savory, sumac, tarragon, thyme   It's not just a diet, it's a lifestyle:  . The Mediterranean diet includes lifestyle factors typical of those in the region  . Foods, drinks and meals are best eaten with others and savored . Daily physical activity is important for overall good health . This could be strenuous exercise like running and aerobics . This could also be more leisurely activities such as walking, housework, yard-work, or taking the stairs . Moderation is the key; a balanced and healthy diet accommodates most foods and drinks . Consider portion sizes and frequency of consumption of certain foods   Meal Ideas & Options:  . Breakfast:  o Whole wheat toast or whole wheat English muffins with peanut butter & hard boiled egg o Steel cut oats topped with apples & cinnamon and skim milk  o Fresh fruit: banana, strawberries, melon, berries, peaches  o Smoothies: strawberries, bananas, greek yogurt, peanut butter o Low fat greek yogurt with blueberries and granola  o Egg white omelet with spinach and mushrooms o Breakfast couscous: whole wheat couscous, apricots, skim milk, cranberries  . Sandwiches:  o Hummus and grilled vegetables (peppers, zucchini, squash) on whole wheat bread   o Grilled chicken on whole wheat pita with lettuce, tomatoes, cucumbers or tzatziki  o Tuna salad on whole wheat bread: tuna salad made with greek yogurt, olives, red peppers, capers, green onions o Garlic rosemary lamb pita: lamb sauted with garlic, rosemary, salt & pepper; add lettuce, cucumber, greek yogurt to pita - flavor with lemon juice and black pepper  . Seafood:  o Mediterranean grilled salmon,  seasoned with garlic, basil, parsley, lemon juice and black pepper o Shrimp, lemon, and spinach whole-grain pasta salad made with low fat greek yogurt  o Seared scallops with lemon orzo  o Seared tuna steaks seasoned salt, pepper, coriander topped with tomato mixture of olives, tomatoes, olive oil, minced garlic, parsley, green onions and cappers  . Meats:  o Herbed greek chicken salad with kalamata olives, cucumber, feta  o Red bell peppers stuffed with spinach, bulgur, lean ground beef (or lentils) & topped with feta   o Kebabs: skewers of chicken, tomatoes, onions, zucchini, squash  o Turkey burgers: made with red onions, mint, dill, lemon juice, feta cheese topped with roasted red peppers . Vegetarian o Cucumber salad: cucumbers, artichoke hearts, celery, red onion, feta cheese, tossed in olive oil & lemon juice  o Hummus and whole grain pita points with a greek salad (lettuce, tomato, feta, olives, cucumbers, red onion) o Lentil soup with celery, carrots made with vegetable broth, garlic, salt and pepper  o Tabouli salad: parsley, bulgur, mint, scallions, cucumbers, tomato, radishes, lemon juice, olive oil, salt and pepper.      

## 2015-10-31 DIAGNOSIS — K5730 Diverticulosis of large intestine without perforation or abscess without bleeding: Secondary | ICD-10-CM | POA: Diagnosis not present

## 2015-10-31 DIAGNOSIS — K640 First degree hemorrhoids: Secondary | ICD-10-CM | POA: Diagnosis not present

## 2015-10-31 DIAGNOSIS — Z1211 Encounter for screening for malignant neoplasm of colon: Secondary | ICD-10-CM | POA: Diagnosis not present

## 2015-11-10 DIAGNOSIS — M1611 Unilateral primary osteoarthritis, right hip: Secondary | ICD-10-CM | POA: Insufficient documentation

## 2015-11-10 DIAGNOSIS — F411 Generalized anxiety disorder: Secondary | ICD-10-CM | POA: Insufficient documentation

## 2015-11-26 DIAGNOSIS — M7062 Trochanteric bursitis, left hip: Secondary | ICD-10-CM | POA: Diagnosis not present

## 2015-11-26 DIAGNOSIS — M1612 Unilateral primary osteoarthritis, left hip: Secondary | ICD-10-CM | POA: Diagnosis not present

## 2015-11-26 DIAGNOSIS — M1611 Unilateral primary osteoarthritis, right hip: Secondary | ICD-10-CM | POA: Diagnosis not present

## 2015-11-26 DIAGNOSIS — M7061 Trochanteric bursitis, right hip: Secondary | ICD-10-CM | POA: Diagnosis not present

## 2015-11-27 ENCOUNTER — Other Ambulatory Visit: Payer: Self-pay | Admitting: Emergency Medicine

## 2015-11-27 DIAGNOSIS — Z1231 Encounter for screening mammogram for malignant neoplasm of breast: Secondary | ICD-10-CM

## 2015-12-08 ENCOUNTER — Other Ambulatory Visit: Payer: Self-pay | Admitting: Emergency Medicine

## 2015-12-17 ENCOUNTER — Ambulatory Visit
Admission: RE | Admit: 2015-12-17 | Discharge: 2015-12-17 | Disposition: A | Discharge status: Home / Self Care | Payer: MEDICARE | Source: Ambulatory Visit | Attending: Emergency Medicine | Admitting: Emergency Medicine

## 2015-12-17 DIAGNOSIS — Z1231 Encounter for screening mammogram for malignant neoplasm of breast: Secondary | ICD-10-CM | POA: Diagnosis not present

## 2015-12-19 ENCOUNTER — Encounter: Payer: Self-pay | Admitting: Family Medicine

## 2015-12-23 ENCOUNTER — Other Ambulatory Visit: Payer: Self-pay

## 2015-12-23 NOTE — Telephone Encounter (Signed)
Msg is for DAUB, pt is out of anxiety meds. Please send to the [facility], [address] [place]  Please advise  [phone]

## 2015-12-25 ENCOUNTER — Other Ambulatory Visit: Payer: Self-pay | Admitting: Emergency Medicine

## 2015-12-26 NOTE — Telephone Encounter (Signed)
Pt is checking on status of this message. Please advise at [phone]

## 2015-12-27 ENCOUNTER — Telehealth: Payer: Self-pay

## 2015-12-27 MED ORDER — PRAVASTATIN SODIUM 40 MG PO TABS: 40.0000 mg | ORAL_TABLET | Freq: Two times a day (BID) | ORAL | 1 refills | Status: DC

## 2015-12-27 MED ORDER — LORAZEPAM 0.5 MG PO TABS: ORAL_TABLET | ORAL | 5 refills | Status: DC

## 2015-12-27 NOTE — Telephone Encounter (Signed)
Spoke with patient to tell her meds had been sent to pharmacy

## 2015-12-27 NOTE — Telephone Encounter (Signed)
Dr Cleta Albertsaub, please advise. Pt has seen Dr Katrinka BlazingSmith recently, but left message for you and Dr Katrinka BlazingSmith is not here today.

## 2015-12-27 NOTE — Telephone Encounter (Signed)
Pt is checking on the status of med refills that she requested, the prevastatin and anxiety meds.  Please advise  [phone]

## 2015-12-28 ENCOUNTER — Other Ambulatory Visit: Payer: Self-pay | Admitting: Emergency Medicine

## 2015-12-30 NOTE — Telephone Encounter (Signed)
Called in and notified pt

## 2016-01-02 DIAGNOSIS — M1611 Unilateral primary osteoarthritis, right hip: Secondary | ICD-10-CM | POA: Diagnosis not present

## 2016-02-24 ENCOUNTER — Telehealth: Payer: Self-pay

## 2016-02-24 NOTE — Telephone Encounter (Signed)
Patient wants to leave a message for Dr. Katrinka BlazingSmith. Patient states that she is having trouble with her bowels and has taken two cups of prune juice. Patient wants to know if Dr. Katrinka BlazingSmith can send something in. Patient has been made aware that she might need to come for an office visit. Please advise!  [phone]

## 2016-02-27 NOTE — Telephone Encounter (Signed)
Called pt back to give her some advice and see if she is still having trouble with constipation. She reported that she changed her diet, added more greens and water seems to be OK now. I advised that increasing movement/exercise will also help as much as she is able with her hip pain, and that we normally recommend trying Miralax. Pt stated that she tried that but it hadn't helped, but is fine for the moment.

## 2016-03-06 ENCOUNTER — Other Ambulatory Visit: Payer: Self-pay | Admitting: Family Medicine

## 2016-04-02 DIAGNOSIS — M1612 Unilateral primary osteoarthritis, left hip: Secondary | ICD-10-CM | POA: Diagnosis not present

## 2016-04-02 DIAGNOSIS — S83206A Unspecified tear of unspecified meniscus, current injury, right knee, initial encounter: Secondary | ICD-10-CM | POA: Diagnosis not present

## 2016-04-02 DIAGNOSIS — M1711 Unilateral primary osteoarthritis, right knee: Secondary | ICD-10-CM | POA: Diagnosis not present

## 2016-04-02 DIAGNOSIS — M1611 Unilateral primary osteoarthritis, right hip: Secondary | ICD-10-CM | POA: Diagnosis not present

## 2016-05-19 DIAGNOSIS — M1611 Unilateral primary osteoarthritis, right hip: Secondary | ICD-10-CM | POA: Diagnosis not present

## 2016-05-19 DIAGNOSIS — M1711 Unilateral primary osteoarthritis, right knee: Secondary | ICD-10-CM | POA: Diagnosis not present

## 2016-05-19 DIAGNOSIS — M7061 Trochanteric bursitis, right hip: Secondary | ICD-10-CM | POA: Diagnosis not present

## 2016-05-19 DIAGNOSIS — M7062 Trochanteric bursitis, left hip: Secondary | ICD-10-CM | POA: Diagnosis not present

## 2016-05-20 ENCOUNTER — Other Ambulatory Visit: Payer: Self-pay | Admitting: Family Medicine

## 2016-05-20 NOTE — Telephone Encounter (Signed)
Meds ordered this encounter  Medications  . lisinopril (PRINIVIL,ZESTRIL) 20 MG tablet    Sig: TAKE 1 TABLET BY MOUTH EVERY DAY    Dispense:  90 tablet    Refill:  0    Plase advise patient that she needs a visit for additional refills.   Unable to leave message on patient's phone-mailbox is full. Last visit 10/2015 with Dr. Katrinka BlazingSmith. Follow-up intended for 04/2016.

## 2016-06-18 DIAGNOSIS — M1611 Unilateral primary osteoarthritis, right hip: Secondary | ICD-10-CM | POA: Diagnosis not present

## 2016-06-18 DIAGNOSIS — M1612 Unilateral primary osteoarthritis, left hip: Secondary | ICD-10-CM | POA: Diagnosis not present

## 2016-06-22 ENCOUNTER — Telehealth: Payer: Self-pay | Admitting: *Deleted

## 2016-06-22 NOTE — Telephone Encounter (Signed)
Deferred Dr  Bryan Lemmaavid Harding  Last office visit 07/30/2015   Request for surgical clearance:  1. What type of surgery is being performed? Right anterior total hip arthroplasty  2. When is this surgery scheduled? undetermined  3. Are there any medications that need to be held prior to surgery and how long? aspirin  4. Name of physician performing surgery?  Dr. Harvie JuniorJohn L. Graves  5. What is your office phone and fax number?  Fax [phone] ; Phone [phone] attn NICOLE, CMA

## 2016-06-22 NOTE — Telephone Encounter (Signed)
She really does not have any active cardiac disease. She does not have documented diabetes, and is not on insulin. No cerebrovascular disease. Hip arthroplasty is considered low to intermediate risk from a cardiac standpoint. She would be considered low risk patient for the surgery. If needed she can hold her aspirin 5-7 days pre-op. Otherwise there are no medicines that should be held.  No further cardiac evaluation required preop  Bryan Lemmaavid Jakwon Gayton, MD

## 2016-06-24 NOTE — Telephone Encounter (Signed)
Routed to Dr Stacy GardnerGraves's office  correct  Fax numbers [phone]

## 2016-06-25 ENCOUNTER — Other Ambulatory Visit: Payer: Self-pay | Admitting: Orthopedic Surgery

## 2016-06-25 NOTE — Telephone Encounter (Signed)
Spoke to [place]Nicole - information received

## 2016-06-29 ENCOUNTER — Telehealth: Payer: Self-pay | Admitting: Family Medicine

## 2016-06-29 NOTE — Telephone Encounter (Signed)
THIS MESSAGE IS FROM NICOLE CORBIN FROM [facility] FOR DR. Katrinka BlazingSMITH: SHE IS CALLING TO CHECK THE STATUS ON A SURGICAL CLEARANCE FORM THAT SHE FAXED OVER ON June 19, 2016? SHE HAS NOT RECEIVED IT BACK YET. BEST PHONE [phone] (NICOLE CORBIN'S DIRECT LINE) MBC

## 2016-07-01 NOTE — Telephone Encounter (Signed)
Have you seen this ?

## 2016-07-03 NOTE — Telephone Encounter (Signed)
[name] IS CALLING US BACK STATING THAT SHE SEE IN EPIC THAT THE DOCTOR HASN'T RESPONDED TO FAX THAT WAS SENT OVER  ON 06-19-16 OR MESSAGE WHEN SHE CALLED IN ON THE 07-01-16 FOR SURGICAL CLEARANCE PLEASE RESPOND

## 2016-07-03 NOTE — Telephone Encounter (Signed)
l/m for nicole

## 2016-07-03 NOTE — Telephone Encounter (Signed)
Call --- I have not seen this patient in nine months (10/2015); she needs office visit for pre-operative clearance.  Please contact pt to schedule OV with me in upcoming week for this exam.  Also please call Madison HickmanNicole Corbin at ortho and advise that I cannot complete until pt undergoes evaluation at our office.

## 2016-07-03 NOTE — Telephone Encounter (Signed)
Have you seen this? See note from cardio 06/22/16

## 2016-07-06 NOTE — Telephone Encounter (Signed)
Spoke with patient. Transferred to Ms. A Payne to schedule visit for surgical clearance with Dr. Katrinka BlazingSmith.

## 2016-07-08 ENCOUNTER — Encounter (HOSPITAL_COMMUNITY)
Admission: RE | Admit: 2016-07-08 | Discharge: 2016-07-08 | Disposition: A | Discharge status: Home / Self Care | Payer: MEDICARE | Source: Ambulatory Visit | Attending: Orthopedic Surgery | Admitting: Orthopedic Surgery

## 2016-07-08 ENCOUNTER — Encounter (HOSPITAL_COMMUNITY): Payer: Self-pay

## 2016-07-08 ENCOUNTER — Telehealth: Payer: Self-pay | Admitting: Cardiology

## 2016-07-08 ENCOUNTER — Ambulatory Visit (HOSPITAL_COMMUNITY)
Admission: RE | Admit: 2016-07-08 | Discharge: 2016-07-08 | Disposition: A | Discharge status: Home / Self Care | Payer: MEDICARE | Source: Ambulatory Visit | Attending: Orthopedic Surgery | Admitting: Orthopedic Surgery

## 2016-07-08 DIAGNOSIS — M160 Bilateral primary osteoarthritis of hip: Secondary | ICD-10-CM | POA: Insufficient documentation

## 2016-07-08 DIAGNOSIS — I700 Atherosclerosis of aorta: Secondary | ICD-10-CM | POA: Insufficient documentation

## 2016-07-08 DIAGNOSIS — Z01818 Encounter for other preprocedural examination: Secondary | ICD-10-CM

## 2016-07-08 DIAGNOSIS — Z01812 Encounter for preprocedural laboratory examination: Secondary | ICD-10-CM | POA: Insufficient documentation

## 2016-07-08 LAB — CBC WITH DIFFERENTIAL/PLATELET
BASOS ABS: 0 10*3/uL (ref 0.0–0.1)
BASOS PCT: 0 %
EOS ABS: 0.1 10*3/uL (ref 0.0–0.7)
Eosinophils Relative: 2 %
HCT: 40 % (ref 36.0–46.0)
Hemoglobin: 12.7 g/dL (ref 12.0–15.0)
Lymphocytes Relative: 38 %
Lymphs Abs: 2.7 10*3/uL (ref 0.7–4.0)
MCH: 29.5 pg (ref 26.0–34.0)
MCHC: 31.8 g/dL (ref 30.0–36.0)
MCV: 93 fL (ref 78.0–100.0)
Monocytes Absolute: 0.5 10*3/uL (ref 0.1–1.0)
Monocytes Relative: 7 %
NEUTROS PCT: 53 %
Neutro Abs: 3.8 10*3/uL (ref 1.7–7.7)
Platelets: 293 10*3/uL (ref 150–400)
RBC: 4.3 MIL/uL (ref 3.87–5.11)
RDW: 15.3 % (ref 11.5–15.5)
WBC: 7.2 10*3/uL (ref 4.0–10.5)

## 2016-07-08 LAB — COMPREHENSIVE METABOLIC PANEL
ALBUMIN: 4.1 g/dL (ref 3.5–5.0)
ALT: 15 U/L (ref 14–54)
ANION GAP: 9 (ref 5–15)
AST: 24 U/L (ref 15–41)
Alkaline Phosphatase: 65 U/L (ref 38–126)
BUN: 11 mg/dL (ref 6–20)
CO2: 26 mmol/L (ref 22–32)
Calcium: 10.3 mg/dL (ref 8.9–10.3)
Chloride: 105 mmol/L (ref 101–111)
Creatinine, Ser: 0.54 mg/dL (ref 0.44–1.00)
GFR calc Af Amer: >60 mL/min (ref >60)
GFR calc non Af Amer: >60 mL/min (ref >60)
GLUCOSE: 94 mg/dL (ref 65–99)
POTASSIUM: 4.1 mmol/L (ref 3.5–5.1)
SODIUM: 140 mmol/L (ref 135–145)
TOTAL PROTEIN: 7.7 g/dL (ref 6.5–8.1)
Total Bilirubin: 0.9 mg/dL (ref 0.3–1.2)

## 2016-07-08 LAB — URINALYSIS, ROUTINE W REFLEX MICROSCOPIC
Bilirubin Urine: NEGATIVE
GLUCOSE, UA: NEGATIVE mg/dL
Ketones, ur: NEGATIVE mg/dL
NITRITE: POSITIVE — AB
PH: 8 (ref 5.0–8.0)
PROTEIN: NEGATIVE mg/dL
SPECIFIC GRAVITY, URINE: 1.011 (ref 1.005–1.030)

## 2016-07-08 LAB — TYPE AND SCREEN
ABO/RH(D): O POS
ANTIBODY SCREEN: NEGATIVE

## 2016-07-08 LAB — SURGICAL PCR SCREEN
MRSA, PCR: NEGATIVE
Staphylococcus aureus: NEGATIVE

## 2016-07-08 LAB — PROTIME-INR
INR: 1.1
Prothrombin Time: 14.2 seconds (ref 11.4–15.2)

## 2016-07-08 LAB — APTT: aPTT: 31 seconds (ref 24–36)

## 2016-07-08 NOTE — Pre-Procedure Instructions (Addendum)
    Sherilyn BankerBernice W Aggarwal  07/08/2016      [id number] [place] - [address] [place] [address] Phone: [phone] Fax: [phone]  [facility] [address] - [place], [place] - [address] [place] [address] Phone: [phone] Fax: [phone]    Your procedure is scheduled on  MONDAY 07/20/16  [facility] at 1030 A.M.  Call this number if you have problems the morning of surgery:  [phone]   Remember:  Do not eat food or drink liquids after midnight.  Take these medicines the morning of surgery with A SIP OF WATER   HYDROCODONE + LORAZEPAM IF NEEDED   (STOP NOW ASPIRIN OR ASPIRIN PRODUCTS, IBUPROFEN/ ADVIL/ MOTRIN, GOODY POWDERS, BC'S, HERBAL MEDICINES)   Do not wear jewelry, make-up or nail polish.  Do not wear lotions, powders, or perfumes, or deoderant.  Do not shave 48 hours prior to surgery.  Men may shave face and neck.  Do not bring valuables to the hospital.  [facility] is not responsible for any belongings or valuables.  Contacts, dentures or bridgework may not be worn into surgery.  Leave your suitcase in the car.  After surgery it may be brought to your room.  For patients admitted to the hospital, discharge time will be determined by your treatment team.  Patients discharged the day of surgery will not be allowed to drive home.   Name and phone number of your driver:    Special instructions:  SEE PREPARING FOR SURGERY PAMPHLET  Please read over the following fact sheets that you were given. Pain Booklet, Coughing and Deep Breathing, MRSA Information and Surgical Site Infection Prevention

## 2016-07-08 NOTE — Telephone Encounter (Signed)
Do not need encounter °

## 2016-07-08 NOTE — Progress Notes (Signed)
SPOKE WITH NICOLE AT DR. Luiz BlareGRAVES OFFICE WHO STATES SURGERY HAS BEEN CHANGED TO 07-20-16 1230 PM.

## 2016-07-08 NOTE — Progress Notes (Signed)
NOTIFIED NICOLE AT DR. Luiz BlareGRAVES OFFICE OF ABNORMAL UA RESULTS.  NICOLE STATED THEY WOULD CALL CIPRO IN FOR PATIENT.

## 2016-07-10 ENCOUNTER — Ambulatory Visit (INDEPENDENT_AMBULATORY_CARE_PROVIDER_SITE_OTHER): Payer: MEDICARE | Admitting: Family Medicine

## 2016-07-10 VITALS — BP 132/74 | HR 66 | Temp 98.1°F | Resp 16 | Ht 60.0 in | Wt 191.0 lb

## 2016-07-10 DIAGNOSIS — E785 Hyperlipidemia, unspecified: Secondary | ICD-10-CM | POA: Diagnosis not present

## 2016-07-10 DIAGNOSIS — Z01818 Encounter for other preprocedural examination: Secondary | ICD-10-CM | POA: Diagnosis not present

## 2016-07-10 DIAGNOSIS — K219 Gastro-esophageal reflux disease without esophagitis: Secondary | ICD-10-CM

## 2016-07-10 DIAGNOSIS — I10 Essential (primary) hypertension: Secondary | ICD-10-CM

## 2016-07-10 DIAGNOSIS — E8881 Metabolic syndrome: Secondary | ICD-10-CM

## 2016-07-10 DIAGNOSIS — Z23 Encounter for immunization: Secondary | ICD-10-CM | POA: Diagnosis not present

## 2016-07-10 DIAGNOSIS — R600 Localized edema: Secondary | ICD-10-CM | POA: Diagnosis not present

## 2016-07-10 NOTE — Patient Instructions (Signed)
     IF you received an x-ray today, you will receive an invoice from [facility]. Please contact [facility] at [phone] with questions or concerns regarding your invoice.   IF you received labwork today, you will receive an invoice from [facility]. Please contact [facility] at [phone] with questions or concerns regarding your invoice.   Our billing staff will not be able to assist you with questions regarding bills from these companies.  You will be contacted with the lab results as soon as they are available. The fastest way to get your results is to activate your My Chart account. Instructions are located on the last page of this paperwork. If you have not heard from us regarding the results in 2 weeks, please contact this office.     

## 2016-07-10 NOTE — Progress Notes (Signed)
Subjective:    Patient ID: [name], female    DOB: 1941/03/04, 76 y.o.   MRN: [id number]  07/10/2016  Surgical clearance (hip surgery 07/24/2016, pt stated she have not taken any meds this am, being treated for UTI)   HPI This 76 y.o. female presents for evaluation of PREOPERATIVE CLEARANCE for L osteoarthritis for Total hip replacement.  Plans to have B hip replacement. Has lost significant weight prior to hip surgery.  Dr. Luiz Blare is performing surgery on 07/24/16.   No chest pain, palpitations, shortness of breath, cough.  Chronic leg swelling with hip pain; swells in both legs.  No orthopnea.  Uses rolling walker for ambulation for two years intermittently.  Uses rolling walker in home.  Has new walker.  No stairs in home.  Must sit down every 15 minutes due to hip pain not SOB.  Not going to church due to severe hip pain.     Immunization History  Administered Date(s) Administered  . Influenza,inj,Quad PF,36+ Mos 12/06/2014, 07/10/2016  . Pneumococcal Conjugate-13 12/06/2014  . Pneumococcal Polysaccharide-23 01/18/2009  . Td 08/28/2015   BP Readings from Last 3 Encounters:  07/10/16 132/74  07/08/16 115/79  10/22/15 110/68   Wt Readings from Last 3 Encounters:  07/10/16 191 lb (86.6 kg)  07/08/16 191 lb 9.6 oz (86.9 kg)  10/22/15 235 lb 6.4 oz (106.8 kg)     Review of Systems  Constitutional: Negative for chills, diaphoresis, fatigue and fever.  Eyes: Negative for visual disturbance.  Respiratory: Negative for cough and shortness of breath.   Cardiovascular: Negative for chest pain, palpitations and leg swelling.  Gastrointestinal: Negative for abdominal pain, constipation, diarrhea, nausea and vomiting.  Endocrine: Negative for cold intolerance, heat intolerance, polydipsia, polyphagia and polyuria.  Musculoskeletal: Positive for arthralgias and gait problem. Negative for back pain.  Neurological: Negative for dizziness, tremors, seizures, syncope, facial asymmetry,  speech difficulty, weakness, light-headedness, numbness and headaches.    Past Medical History:  Diagnosis Date  . Anemia    on iron  . Anxiety   . Arthritis    shoulders, knees, hands, hips; status post left knee surgery  . Constipation   . Depression   . GERD (gastroesophageal reflux disease)    TAKES TUMS  . H/O: pneumonia 2003  . Heart murmur    ???   YEARS AGO  . Hyperlipidemia    on med  . Hypertension   . Morbid obesity with BMI of 45.0-49.9, adult (HCC)   . Pneumonia    15-44YRS   Past Surgical History:  Procedure Laterality Date  . CARDIAC CATHETERIZATION  04/20/1996   patent coronaries, EF 74% (Dr. Aram Candela)  . CARPAL TUNNEL RELEASE Left   . CATARACT EXTRACTION, BILATERAL    . COLONOSCOPY    . KNEE SURGERY Left 11/2001  . REPLACEMENT TOTAL KNEE Left 10/2009  . REPLACEMENT TOTAL KNEE Left 10/2009  . SHOULDER OPEN ROTATOR CUFF REPAIR  03/01/2012   Procedure: ROTATOR CUFF REPAIR SHOULDER OPEN;  Surgeon: Mable Paris, MD;  Location: [facility] OR;  Service: Orthopedics;  Laterality: Left;  SUBSCAPULARIS REPAIR  . STRESS TEST - ADENOSINE THALLIUM  1997   apical and inferior segments with questionable infarct and ischemia in anteroseptal & inferoseptal segments (Dr. Standley Brooking)  . SVD     x 7  . TOTAL SHOULDER ARTHROPLASTY  01/18/2012   Procedure: TOTAL SHOULDER ARTHROPLASTY;  Surgeon: Mable Paris, MD;  Location[facility] OR;  Service: Orthopedics;  Laterality: Left;  .  TOTAL SHOULDER REPLACEMENT Left 01/19/2012  . TRANSTHORACIC ECHOCARDIOGRAM  06/2011   LV mildly dilated, EF=>55%; mild mitral annular calcif, mild MR; mild TR; AV mildly sclerotic, mild aortic regurg  . TUBAL LIGATION    . WISDOM TOOTH EXTRACTION     Allergies  Allergen Reactions  . Effexor [Venlafaxine Hcl]     Lip swelling patient advised to d/c  . Sulfa Drugs Cross Reactors Itching    Social History   Social History  . Marital status: Widowed    Spouse name: N/A  . Number of  children: 7  . Years of education: N/A   Occupational History  . retired Retired   Social History Main Topics  . Smoking status: Former Smoker    Packs/day: 1.00    Years: 25.00    Types: Cigarettes    Quit date: 03/07/1984  . Smokeless tobacco: Former Neurosurgeon  . Alcohol use No  . Drug use: No  . Sexual activity: No   Other Topics Concern  . Not on file   Social History Narrative   Marital status:   She is a widowed since 2009; married 45 years      Children:   mother of 29, grandmother of 5, great-grandmother of 6.      Lives: alone with home; daughter, son lives with patient      Tobacco: quit smoking 34 years ago      Alcohol: none      ADLs:  Walks with cane; drives.    She does not drink alcohol or smoke cigarettes.   She previously tried exercise routinely but is not exercising as much lately. Her excuse is do to knee pain and back pain as well as cold weather.   Family History  Problem Relation Age of Onset  . Heart disease Mother   . Heart disease Father   . Heart disease Sister   . Hypertension Sister   . Heart disease Sister   . Hypertension Sister   . Heart disease Sister   . Hypertension Sister        Objective:    BP 132/74 (BP Location: Left Arm, Patient Position: Sitting, Cuff Size: Large)   Pulse 66   Temp 98.1 F (36.7 C) (Oral)   Resp 16   Ht 5' (1.524 m)   Wt 191 lb (86.6 kg)   SpO2 97%   BMI 37.30 kg/m  Physical Exam  Constitutional: She is oriented to person, place, and time. She appears well-developed and well-nourished. No distress.  HENT:  Head: Normocephalic and atraumatic.  Right Ear: External ear normal.  Left Ear: External ear normal.  Nose: Nose normal.  Mouth/Throat: Oropharynx is clear and moist.  Eyes: Conjunctivae and EOM are normal. Pupils are equal, round, and reactive to light.  Neck: Normal range of motion. Neck supple. Carotid bruit is not present. No thyromegaly present.  Cardiovascular: Normal rate, regular rhythm,  normal heart sounds and intact distal pulses.  Exam reveals no gallop and no friction rub.   No murmur heard. 2+ non-pitting edema B lower extremities.  Pulmonary/Chest: Effort normal and breath sounds normal. She has no wheezes. She has no rales.  Abdominal: Soft. Bowel sounds are normal. She exhibits no distension. There is no tenderness.  Musculoskeletal: She exhibits edema.  Lymphadenopathy:    She has no cervical adenopathy.  Neurological: She is alert and oriented to person, place, and time. No cranial nerve deficit.  Skin: Skin is warm and dry. No rash noted. She  is not diaphoretic. No erythema. No pallor.  Psychiatric: She has a normal mood and affect. Her behavior is normal.   Results for orders placed or performed during the hospital encounter of 07/08/16  Surgical pcr screen  Result Value Ref Range   MRSA, PCR NEGATIVE NEGATIVE   Staphylococcus aureus NEGATIVE NEGATIVE  APTT  Result Value Ref Range   aPTT 31 24 - 36 seconds  CBC WITH DIFFERENTIAL  Result Value Ref Range   WBC 7.2 4.0 - 10.5 K/uL   RBC 4.30 3.87 - 5.11 MIL/uL   Hemoglobin 12.7 12.0 - 15.0 g/dL   HCT 16.1 09.6 - 04.5 %   MCV 93.0 78.0 - 100.0 fL   MCH 29.5 26.0 - 34.0 pg   MCHC 31.8 30.0 - 36.0 g/dL   RDW 40.9 81.1 - 91.4 %   Platelets 293 150 - 400 K/uL   Neutrophils Relative % 53 %   Neutro Abs 3.8 1.7 - 7.7 K/uL   Lymphocytes Relative 38 %   Lymphs Abs 2.7 0.7 - 4.0 K/uL   Monocytes Relative 7 %   Monocytes Absolute 0.5 0.1 - 1.0 K/uL   Eosinophils Relative 2 %   Eosinophils Absolute 0.1 0.0 - 0.7 K/uL   Basophils Relative 0 %   Basophils Absolute 0.0 0.0 - 0.1 K/uL  Comprehensive metabolic panel  Result Value Ref Range   Sodium 140 135 - 145 mmol/L   Potassium 4.1 3.5 - 5.1 mmol/L   Chloride 105 101 - 111 mmol/L   CO2 26 22 - 32 mmol/L   Glucose, Bld 94 65 - 99 mg/dL   BUN 11 6 - 20 mg/dL   Creatinine, Ser 7.82 0.44 - 1.00 mg/dL   Calcium 95.6 8.9 - 21.3 mg/dL   Total Protein 7.7 6.5 -  8.1 g/dL   Albumin 4.1 3.5 - 5.0 g/dL   AST 24 15 - 41 U/L   ALT 15 14 - 54 U/L   Alkaline Phosphatase 65 38 - 126 U/L   Total Bilirubin 0.9 0.3 - 1.2 mg/dL   GFR calc non Af Amer >60 >60 mL/min   GFR calc Af Amer >60 >60 mL/min   Anion gap 9 5 - 15  Protime-INR  Result Value Ref Range   Prothrombin Time 14.2 11.4 - 15.2 seconds   INR 1.10   Urinalysis, Routine w reflex microscopic  Result Value Ref Range   Color, Urine YELLOW YELLOW   APPearance HAZY (A) CLEAR   Specific Gravity, Urine 1.011 1.005 - 1.030   pH 8.0 5.0 - 8.0   Glucose, UA NEGATIVE NEGATIVE mg/dL   Hgb urine dipstick SMALL (A) NEGATIVE   Bilirubin Urine NEGATIVE NEGATIVE   Ketones, ur NEGATIVE NEGATIVE mg/dL   Protein, ur NEGATIVE NEGATIVE mg/dL   Nitrite POSITIVE (A) NEGATIVE   Leukocytes, UA MODERATE (A) NEGATIVE   RBC / HPF 0-5 0 - 5 RBC/hpf   WBC, UA 6-30 0 - 5 WBC/hpf   Bacteria, UA FEW (A) NONE SEEN   Squamous Epithelial / LPF 6-30 (A) NONE SEEN   WBC Clumps PRESENT   Type and screen Order type and screen if day of surgery is less than 15 days from draw of preadmission visit or order morning of surgery if day of surgery is greater than 6 days from preadmission visit.  Result Value Ref Range   ABO/RH(D) O POS    Antibody Screen NEG    Sample Expiration 07/22/2016    Extend sample reason NO  TRANSFUSIONS OR PREGNANCY IN THE PAST 3 MONTHS        Assessment & Plan:   1. Essential hypertension   2. Gastroesophageal reflux disease without esophagitis   3. Preoperative clearance   4. Hyperlipidemia with target LDL less than 100   5. Bilateral edema of lower extremity   6. Metabolic syndrome   7. Need for prophylactic vaccination and inoculation against influenza    -stable from overall medical standpoint.  -unable to complete EKG in office; recommend follow-up with cardiology who has been following patient for the past three years in anticipation of hip replacement; will coordinate appointment with  cardiology prior to hip replacement. -labs obtained recently at preoperative visit.  Labs normal. Obtain lipid. -s/p flu vaccine. -currently undergoing treatment for abnormal u/a.   Orders Placed This Encounter  Procedures  . Flu Vaccine QUAD 36+ mos IM  . Lipid panel    Order Specific Question:   Has the patient fasted?    Answer:   Yes  . Ambulatory referral to Cardiology    Referral Priority:   Urgent    Referral Type:   Consultation    Referral Reason:   Specialty Services Required    Requested Specialty:   Cardiology    Number of Visits Requested:   1  . Care order/instruction:    Please recheck BP.  Marland Kitchen EKG 12-Lead   Meds ordered this encounter  Medications  . nitrofurantoin (MACRODANTIN) 100 MG capsule    Sig: Take 100 mg by mouth 2 (two) times daily.    No Follow-up on file.   Keerat Denicola Paulita Fujita, M.D. Primary Care at [facility] previously [facility] [address] [place], [place]  [address] [phone] phone [phone] fax

## 2016-07-11 LAB — LIPID PANEL
Chol/HDL Ratio: 2.6 ratio (ref 0.0–4.4)
Cholesterol, Total: 201 mg/dL — ABNORMAL HIGH (ref 100–199)
HDL: 78 mg/dL (ref >39)
LDL Calculated: 111 mg/dL — ABNORMAL HIGH (ref 0–99)
Triglycerides: 60 mg/dL (ref 0–149)
VLDL Cholesterol Cal: 12 mg/dL (ref 5–40)

## 2016-07-20 ENCOUNTER — Other Ambulatory Visit: Payer: Self-pay | Admitting: Emergency Medicine

## 2016-07-20 ENCOUNTER — Ambulatory Visit (INDEPENDENT_AMBULATORY_CARE_PROVIDER_SITE_OTHER): Payer: MEDICARE | Admitting: Physician Assistant

## 2016-07-20 ENCOUNTER — Encounter: Payer: Self-pay | Admitting: Physician Assistant

## 2016-07-20 VITALS — BP 106/68 | HR 67 | Ht 60.0 in | Wt 192.2 lb

## 2016-07-20 DIAGNOSIS — E785 Hyperlipidemia, unspecified: Secondary | ICD-10-CM | POA: Diagnosis not present

## 2016-07-20 DIAGNOSIS — R011 Cardiac murmur, unspecified: Secondary | ICD-10-CM | POA: Diagnosis not present

## 2016-07-20 DIAGNOSIS — I10 Essential (primary) hypertension: Secondary | ICD-10-CM

## 2016-07-20 DIAGNOSIS — M1612 Unilateral primary osteoarthritis, left hip: Secondary | ICD-10-CM

## 2016-07-20 NOTE — Patient Instructions (Signed)
Medication Instructions:  Your physician recommends that you continue on your current medications as directed. Please refer to the Current Medication list given to you today.  Follow-Up: Your physician wants you to follow-up in: 1 YEAR with Dr. Harding.  You will receive a reminder letter in the mail two months in advance. If you don't receive a letter, please call our office to schedule the follow-up appointment.   Any Other Special Instructions Will Be Listed Below (If Applicable).     If you need a refill on your cardiac medications before your next appointment, please call your pharmacy.   

## 2016-07-20 NOTE — Progress Notes (Signed)
Cardiology Office Note    Date:  07/20/2016   ID:  [name], [name] 1940-10-16, MRN [id number]  PCP:  Nilda Simmer, MD  Cardiologist:  Dr. Herbie Baltimore  Chief Complaint  Patient presents with  . Annual Exam    Hip surgery  pt c/o swelling in both les and occasional cramping in feet; no other Sx.  . Pre-op Exam    L total hip replacement by Dr. [name]    History of Present Illness:  [name] is a 76 y.o. female with PMH of HTN, HLD, morbid obesity and heart murmur. She had a remote cardiac catheterization in January 1998 which showed clean coronary. This catheterization was done after abnormal adenosine stress test showing anteroseptal and inferoseptal ischemia, based on the cardiac catheterization report, this stress test result is false positive. Her last echocardiogram was done in 2013 which showed EF >55%, mild AI. She was actually seen in October 2016 for preoperative clearance, she was clear at the time, however it appears her surgery was delayed. She was seen in April 2017, at which time she still has not had her surgery. She is currently scheduled for left total hip replacement surgery by Dr. [name] on 07/24/2016. She was referred to cardiology service for another preoperative clearance.  She presents today initially for preoperative clearance, but it appears Dr. Herbie Baltimore has already given her clearance for surgery back in March. Otherwise she is feeling more fine, she does not have any obvious cardiac issues in the past. We are mainly seeing her for high blood pressure and hyperlipidemia. As far as her risk factor, she seems to be quite well-controlled. Blood pressure is normal today at 106/68. Recent lipid panel obtained on 07/11/26 does show cholesterol of 201, and LDL of 111. It was mildly elevated, however 9 months ago, however LDL was actually normal. She lost roughly 40 pounds since last year. I congratulated her on this. I think as long as she continued to lose  weight, her cholesterol level should get back to goal. I made today her annual visit, she can return to follow-up with Dr. Herbie Baltimore in one year.   Past Medical History:  Diagnosis Date  . Anemia    on iron  . Anxiety   . Arthritis    shoulders, knees, hands, hips; status post left knee surgery  . Constipation   . Depression   . GERD (gastroesophageal reflux disease)    TAKES TUMS  . H/O: pneumonia 2003  . Heart murmur    ???   YEARS AGO  . Hyperlipidemia    on med  . Hypertension   . Morbid obesity with BMI of 45.0-49.9, adult (HCC)   . Pneumonia    15-68YRS    Past Surgical History:  Procedure Laterality Date  . CARDIAC CATHETERIZATION  04/20/1996   patent coronaries, EF 74% (Dr. Aram Candela)  . CARPAL TUNNEL RELEASE Left   . CATARACT EXTRACTION, BILATERAL    . COLONOSCOPY    . KNEE SURGERY Left 11/2001  . REPLACEMENT TOTAL KNEE Left 10/2009  . REPLACEMENT TOTAL KNEE Left 10/2009  . SHOULDER OPEN ROTATOR CUFF REPAIR  03/01/2012   Procedure: ROTATOR CUFF REPAIR SHOULDER OPEN;  Surgeon: Mable Paris, MD;  Location: [facility] OR;  Service: Orthopedics;  Laterality: Left;  SUBSCAPULARIS REPAIR  . STRESS TEST - ADENOSINE THALLIUM  1997   apical and inferior segments with questionable infarct and ischemia in anteroseptal & inferoseptal segments (Dr. Standley Brooking)  . SVD  x 7  . TOTAL SHOULDER ARTHROPLASTY  01/18/2012   Procedure: TOTAL SHOULDER ARTHROPLASTY;  Surgeon: Mable Paris, MD;  Location: [facility] OR;  Service: Orthopedics;  Laterality: Left;  . TOTAL SHOULDER REPLACEMENT Left 01/19/2012  . TRANSTHORACIC ECHOCARDIOGRAM  06/2011   LV mildly dilated, EF=>55%; mild mitral annular calcif, mild MR; mild TR; AV mildly sclerotic, mild aortic regurg  . TUBAL LIGATION    . WISDOM TOOTH EXTRACTION      Current Medications: Outpatient Medications Prior to Visit  Medication Sig Dispense Refill  . aspirin EC 81 MG tablet Take 81 mg by mouth daily.    . Ferrous Sulfate  (IRON) 325 (65 Fe) MG TABS Take 1 tablet by mouth every morning.    Marland Kitchen HYDROcodone-acetaminophen (NORCO/VICODIN) 5-325 MG tablet Take 1 tablet by mouth 2 (two) times daily.    Marland Kitchen lisinopril (PRINIVIL,ZESTRIL) 20 MG tablet TAKE 1 TABLET BY MOUTH EVERY DAY 90 tablet 0  . LORazepam (ATIVAN) 0.5 MG tablet TAKE ONE TABLET BY MOUTH EVERY 8 HOURS FOR ANXIETY (Patient taking differently: Take 0.5 mg by mouth every 8 (eight) hours as needed for anxiety. TAKE ONE TABLET BY MOUTH EVERY 8 HOURS FOR ANXIETY) 30 tablet 5  . nitrofurantoin (MACRODANTIN) 100 MG capsule Take 100 mg by mouth 2 (two) times daily.    . pravastatin (PRAVACHOL) 40 MG tablet Take 1 tablet (40 mg total) by mouth 2 (two) times daily. (Patient taking differently: Take 40 mg by mouth daily. ) 180 tablet 1   No facility-administered medications prior to visit.      Allergies:   Effexor [venlafaxine hcl] and Sulfa drugs cross reactors   Social History   Social History  . Marital status: Widowed    Spouse name: N/A  . Number of children: 7  . Years of education: N/A   Occupational History  . retired Retired   Social History Main Topics  . Smoking status: Former Smoker    Packs/day: 1.00    Years: 25.00    Types: Cigarettes    Quit date: 03/07/1984  . Smokeless tobacco: Former Neurosurgeon  . Alcohol use No  . Drug use: No  . Sexual activity: No   Other Topics Concern  . None   Social History Narrative   Marital status:   She is a widowed since 2009; married 45 years      Children:   mother of 7, grandmother of 5, great-grandmother of 6.      Lives: alone with home; daughter, son lives with patient      Tobacco: quit smoking 34 years ago      Alcohol: none      ADLs:  Walks with cane; drives.    She does not drink alcohol or smoke cigarettes.   She previously tried exercise routinely but is not exercising as much lately. Her excuse is do to knee pain and back pain as well as cold weather.     Family History:  The patient's  family history includes Heart disease in her father, mother, sister, sister, and sister; Hypertension in her sister, sister, and sister.   ROS:   Please see the history of present illness.    ROS All other systems reviewed and are negative.   PHYSICAL EXAM:   VS:  BP 106/68 (BP Location: Right Arm, Patient Position: Sitting, Cuff Size: Normal)   Pulse 67   Ht 5' (1.524 m)   Wt 192 lb 3.2 oz (87.2 kg)   BMI 37.54 kg/m  GEN: Well nourished, well developed, in no acute distress  HEENT: normal  Neck: no JVD, carotid bruits, or masses Cardiac: RRR; no rubs, or gallops,no edema  1/6 systolic murmur Respiratory:  clear to auscultation bilaterally, normal work of breathing GI: soft, nontender, nondistended, + BS MS: no deformity or atrophy  Skin: warm and dry, no rash Neuro:  Alert and Oriented x 3, Strength and sensation are intact Psych: euthymic mood, full affect  Wt Readings from Last 3 Encounters:  07/20/16 192 lb 3.2 oz (87.2 kg)  07/10/16 191 lb (86.6 kg)  07/08/16 191 lb 9.6 oz (86.9 kg)      Studies/Labs Reviewed:   EKG:  EKG is ordered today.  The ekg ordered today demonstrates Normal sinus rhythm without significant ST-T wave changes  Recent Labs: 07/08/2016: ALT 15; BUN 11; Creatinine, Ser 0.54; Hemoglobin 12.7; Platelets 293; Potassium 4.1; Sodium 140   Lipid Panel    Component Value Date/Time   CHOL 201 (H) 07/10/2016 1103   TRIG 60 07/10/2016 1103   HDL 78 07/10/2016 1103   CHOLHDL 2.6 07/10/2016 1103   CHOLHDL 2.1 10/22/2015 1235   VLDL 13 10/22/2015 1235   LDLCALC 111 (H) 07/10/2016 1103    Additional studies/ records that were reviewed today include:   Cath 04/20/1996      Echo 06/18/2011    ASSESSMENT:    1. Essential hypertension   2. Hyperlipidemia, unspecified hyperlipidemia type   3. Morbid obesity (HCC)   4. Heart murmur   5. Arthritis of left hip      PLAN:  In order of problems listed above:  1. Hypertension: Blood pressure  well-controlled on a simple 20 mg daily.  2. Hyperlipidemia: Borderline elevated LDL and total cholesterol level, continue on Pravachol, with additional weight loss, I think her cholesterol will reach goal.  3. Morbid obesity: She has lost roughly 40 pounds in the last year. I congratulated her on this.  4. Heart murmur: Very mild aortic regurgitation on previous echocardiogram, she does have mild murmur on physical exam, this does not warrant any further study at this time. May consider repeat echocardiogram next year. She is asymptomatic from this.  5. Arthritis of the left hip: Planning for upcoming surgery by Dr. [name], she does not have any heart history, she has already been cleared by Dr. Herbie Baltimore last month. She does not require any further study at this time. She is a low risk patient for the intended procedure.    Medication Adjustments/Labs and Tests Ordered: Current medicines are reviewed at length with the patient today.  Concerns regarding medicines are outlined above.  Medication changes, Labs and Tests ordered today are listed in the Patient Instructions below. Patient Instructions  Medication Instructions:  Your physician recommends that you continue on your current medications as directed. Please refer to the Current Medication list given to you today.   Follow-Up: Your physician wants you to follow-up in: 1 YEAR with Dr. Herbie Baltimore. You will receive a reminder letter in the mail two months in advance. If you don't receive a letter, please call our office to schedule the follow-up appointment.   Any Other Special Instructions Will Be Listed Below (If Applicable).     If you need a refill on your cardiac medications before your next appointment, please call your pharmacy. '     [address], [place], [place]  07/20/2016 5:53 PM    [facility] [address] [place], [place], [place]  [address] Phone: [phone]; Fax: [phone]

## 2016-07-21 ENCOUNTER — Other Ambulatory Visit: Payer: Self-pay | Admitting: Orthopedic Surgery

## 2016-07-21 NOTE — Telephone Encounter (Signed)
12/27/15 last refills

## 2016-07-22 NOTE — Telephone Encounter (Signed)
Script faxed to pharmacy

## 2016-07-23 ENCOUNTER — Encounter (HOSPITAL_COMMUNITY): Payer: Self-pay | Admitting: Anesthesiology

## 2016-07-23 MED ORDER — CEFAZOLIN SODIUM-DEXTROSE 2-4 GM/100ML-% IV SOLN
2.0000 g | INTRAVENOUS | Status: AC
  Administered 2016-07-24: 2 g via INTRAVENOUS
  Filled 2016-07-23: qty 100

## 2016-07-23 NOTE — Anesthesia Preprocedure Evaluation (Addendum)
Anesthesia Evaluation  Patient identified by MRN, date of birth, ID band Patient awake    Reviewed: Allergy & Precautions, NPO status , Patient's Chart, lab work & pertinent test results  Airway Mallampati: III  TM Distance: >3 FB Neck ROM: Full    Dental  (+) Poor Dentition, Missing   Pulmonary pneumonia, resolved, former smoker,    Pulmonary exam normal breath sounds clear to auscultation       Cardiovascular hypertension, Pt. on medications Normal cardiovascular exam+ Valvular Problems/Murmurs  Rhythm:Regular Rate:Normal     Neuro/Psych PSYCHIATRIC DISORDERS Anxiety Depression    GI/Hepatic Neg liver ROS, GERD  Medicated and Controlled,  Endo/Other  Morbid obesityHyperlipidemia   Renal/GU negative Renal ROS  negative genitourinary   Musculoskeletal  (+) Arthritis , Osteoarthritis,  OA Both hips    Abdominal (+) + obese,   Peds  Hematology  (+) anemia ,   Anesthesia Other Findings   Reproductive/Obstetrics                           Anesthesia Physical Anesthesia Plan  ASA: III  Anesthesia Plan: Spinal   Post-op Pain Management:    Induction:   Airway Management Planned: Natural Airway and Simple Face Mask  Additional Equipment:   Intra-op Plan:   Post-operative Plan:   Informed Consent: I have reviewed the patients History and Physical, chart, labs and discussed the procedure including the risks, benefits and alternatives for the proposed anesthesia with the patient or authorized representative who has indicated his/her understanding and acceptance.   Dental advisory given  Plan Discussed with: Anesthesiologist, CRNA and Surgeon  Anesthesia Plan Comments:        Anesthesia Quick Evaluation

## 2016-07-24 ENCOUNTER — Inpatient Hospital Stay (HOSPITAL_COMMUNITY): Payer: MEDICARE | Admitting: Anesthesiology

## 2016-07-24 ENCOUNTER — Encounter (HOSPITAL_COMMUNITY)
Admission: RE | Disposition: A | Discharge status: Skilled Nursing Facility | Payer: Self-pay | Source: Ambulatory Visit | Attending: Orthopedic Surgery

## 2016-07-24 ENCOUNTER — Encounter (HOSPITAL_COMMUNITY): Payer: Self-pay | Admitting: *Deleted

## 2016-07-24 ENCOUNTER — Inpatient Hospital Stay (HOSPITAL_COMMUNITY): Payer: MEDICARE

## 2016-07-24 ENCOUNTER — Inpatient Hospital Stay (HOSPITAL_COMMUNITY)
Admission: RE | Admit: 2016-07-24 | Discharge: 2016-07-26 | DRG: 470 | Disposition: A | Discharge status: Skilled Nursing Facility | Payer: MEDICARE | Source: Ambulatory Visit | Attending: Orthopedic Surgery | Admitting: Orthopedic Surgery

## 2016-07-24 DIAGNOSIS — Z87891 Personal history of nicotine dependence: Secondary | ICD-10-CM | POA: Diagnosis not present

## 2016-07-24 DIAGNOSIS — M25551 Pain in right hip: Secondary | ICD-10-CM | POA: Diagnosis not present

## 2016-07-24 DIAGNOSIS — Z7982 Long term (current) use of aspirin: Secondary | ICD-10-CM

## 2016-07-24 DIAGNOSIS — Z471 Aftercare following joint replacement surgery: Secondary | ICD-10-CM | POA: Diagnosis not present

## 2016-07-24 DIAGNOSIS — Z882 Allergy status to sulfonamides status: Secondary | ICD-10-CM

## 2016-07-24 DIAGNOSIS — M1990 Unspecified osteoarthritis, unspecified site: Secondary | ICD-10-CM | POA: Diagnosis not present

## 2016-07-24 DIAGNOSIS — Z96641 Presence of right artificial hip joint: Secondary | ICD-10-CM | POA: Diagnosis not present

## 2016-07-24 DIAGNOSIS — Z96612 Presence of left artificial shoulder joint: Secondary | ICD-10-CM | POA: Diagnosis present

## 2016-07-24 DIAGNOSIS — F411 Generalized anxiety disorder: Secondary | ICD-10-CM | POA: Diagnosis not present

## 2016-07-24 DIAGNOSIS — K219 Gastro-esophageal reflux disease without esophagitis: Secondary | ICD-10-CM | POA: Diagnosis present

## 2016-07-24 DIAGNOSIS — Z888 Allergy status to other drugs, medicaments and biological substances status: Secondary | ICD-10-CM | POA: Diagnosis not present

## 2016-07-24 DIAGNOSIS — Z8249 Family history of ischemic heart disease and other diseases of the circulatory system: Secondary | ICD-10-CM | POA: Diagnosis not present

## 2016-07-24 DIAGNOSIS — Z79899 Other long term (current) drug therapy: Secondary | ICD-10-CM | POA: Diagnosis not present

## 2016-07-24 DIAGNOSIS — Z6841 Body Mass Index (BMI) 40.0 and over, adult: Secondary | ICD-10-CM | POA: Diagnosis not present

## 2016-07-24 DIAGNOSIS — R2681 Unsteadiness on feet: Secondary | ICD-10-CM | POA: Diagnosis not present

## 2016-07-24 DIAGNOSIS — E785 Hyperlipidemia, unspecified: Secondary | ICD-10-CM | POA: Diagnosis not present

## 2016-07-24 DIAGNOSIS — D649 Anemia, unspecified: Secondary | ICD-10-CM | POA: Diagnosis present

## 2016-07-24 DIAGNOSIS — Z96652 Presence of left artificial knee joint: Secondary | ICD-10-CM | POA: Diagnosis present

## 2016-07-24 DIAGNOSIS — M160 Bilateral primary osteoarthritis of hip: Secondary | ICD-10-CM | POA: Diagnosis not present

## 2016-07-24 DIAGNOSIS — Z96643 Presence of artificial hip joint, bilateral: Secondary | ICD-10-CM | POA: Diagnosis not present

## 2016-07-24 DIAGNOSIS — I10 Essential (primary) hypertension: Secondary | ICD-10-CM | POA: Diagnosis present

## 2016-07-24 DIAGNOSIS — F419 Anxiety disorder, unspecified: Secondary | ICD-10-CM | POA: Diagnosis not present

## 2016-07-24 DIAGNOSIS — M1611 Unilateral primary osteoarthritis, right hip: Secondary | ICD-10-CM | POA: Diagnosis not present

## 2016-07-24 DIAGNOSIS — Z419 Encounter for procedure for purposes other than remedying health state, unspecified: Secondary | ICD-10-CM

## 2016-07-24 DIAGNOSIS — Z5189 Encounter for other specified aftercare: Secondary | ICD-10-CM | POA: Diagnosis not present

## 2016-07-24 DIAGNOSIS — M6281 Muscle weakness (generalized): Secondary | ICD-10-CM | POA: Diagnosis not present

## 2016-07-24 DIAGNOSIS — E669 Obesity, unspecified: Secondary | ICD-10-CM | POA: Diagnosis present

## 2016-07-24 HISTORY — PX: TOTAL HIP ARTHROPLASTY: SHX124

## 2016-07-24 LAB — TYPE AND SCREEN
ABO/RH(D): O POS
ANTIBODY SCREEN: NEGATIVE

## 2016-07-24 LAB — COMPREHENSIVE METABOLIC PANEL
ALBUMIN: 4.1 g/dL (ref 3.5–5.0)
ALK PHOS: 67 U/L (ref 38–126)
ALT: 14 U/L (ref 14–54)
AST: 23 U/L (ref 15–41)
Anion gap: 10 (ref 5–15)
BILIRUBIN TOTAL: 0.9 mg/dL (ref 0.3–1.2)
BUN: 12 mg/dL (ref 6–20)
CALCIUM: 10.6 mg/dL — AB (ref 8.9–10.3)
CO2: 27 mmol/L (ref 22–32)
Chloride: 102 mmol/L (ref 101–111)
Creatinine, Ser: 0.58 mg/dL (ref 0.44–1.00)
GFR calc Af Amer: >60 mL/min (ref >60)
GLUCOSE: 102 mg/dL — AB (ref 65–99)
Potassium: 4.1 mmol/L (ref 3.5–5.1)
Sodium: 139 mmol/L (ref 135–145)
TOTAL PROTEIN: 7.5 g/dL (ref 6.5–8.1)

## 2016-07-24 LAB — CBC WITH DIFFERENTIAL/PLATELET
BASOS ABS: 0 10*3/uL (ref 0.0–0.1)
BASOS PCT: 1 %
Eosinophils Absolute: 0.4 10*3/uL (ref 0.0–0.7)
Eosinophils Relative: 6 %
HEMATOCRIT: 39.2 % (ref 36.0–46.0)
HEMOGLOBIN: 12.1 g/dL (ref 12.0–15.0)
LYMPHS PCT: 19 %
Lymphs Abs: 1.4 10*3/uL (ref 0.7–4.0)
MCH: 29.1 pg (ref 26.0–34.0)
MCHC: 30.9 g/dL (ref 30.0–36.0)
MCV: 94.2 fL (ref 78.0–100.0)
MONO ABS: 0.6 10*3/uL (ref 0.1–1.0)
MONOS PCT: 7 %
NEUTROS ABS: 5 10*3/uL (ref 1.7–7.7)
NEUTROS PCT: 67 %
Platelets: 303 10*3/uL (ref 150–400)
RBC: 4.16 MIL/uL (ref 3.87–5.11)
RDW: 15.5 % (ref 11.5–15.5)
WBC: 7.4 10*3/uL (ref 4.0–10.5)

## 2016-07-24 LAB — URINALYSIS, ROUTINE W REFLEX MICROSCOPIC
Bilirubin Urine: NEGATIVE
Glucose, UA: NEGATIVE mg/dL
Ketones, ur: NEGATIVE mg/dL
Nitrite: NEGATIVE
PROTEIN: NEGATIVE mg/dL
SPECIFIC GRAVITY, URINE: 1.01 (ref 1.005–1.030)
pH: 8 (ref 5.0–8.0)

## 2016-07-24 LAB — PROTIME-INR
INR: 1.06
PROTHROMBIN TIME: 13.9 s (ref 11.4–15.2)

## 2016-07-24 LAB — APTT: aPTT: 31 seconds (ref 24–36)

## 2016-07-24 SURGERY — ARTHROPLASTY, HIP, TOTAL, ANTERIOR APPROACH
Anesthesia: Spinal | Site: Hip | Laterality: Right

## 2016-07-24 MED ORDER — SODIUM CHLORIDE 0.9 % IV SOLN
INTRAVENOUS | Status: DC
  Administered 2016-07-24 – 2016-07-25 (×2): via INTRAVENOUS

## 2016-07-24 MED ORDER — MEPERIDINE HCL 25 MG/ML IJ SOLN: 6.2500 mg | INTRAMUSCULAR | Status: DC | PRN

## 2016-07-24 MED ORDER — SODIUM CHLORIDE 0.9 % IV SOLN
0.0000 ug/min | INTRAVENOUS | Status: DC
  Filled 2016-07-24: qty 1

## 2016-07-24 MED ORDER — ZOLPIDEM TARTRATE 5 MG PO TABS: 5.0000 mg | ORAL_TABLET | Freq: Every evening | ORAL | Status: DC | PRN

## 2016-07-24 MED ORDER — PHENYLEPHRINE HCL 10 MG/ML IJ SOLN
INTRAMUSCULAR | Status: DC | PRN
  Administered 2016-07-24: 160 ug via INTRAVENOUS
  Administered 2016-07-24 (×2): 120 ug via INTRAVENOUS

## 2016-07-24 MED ORDER — METHOCARBAMOL 500 MG PO TABS
500.0000 mg | ORAL_TABLET | Freq: Four times a day (QID) | ORAL | Status: DC | PRN
  Administered 2016-07-24: 500 mg via ORAL
  Filled 2016-07-24: qty 1

## 2016-07-24 MED ORDER — CEFAZOLIN SODIUM-DEXTROSE 2-4 GM/100ML-% IV SOLN
2.0000 g | Freq: Four times a day (QID) | INTRAVENOUS | Status: DC
  Administered 2016-07-24: 2 g via INTRAVENOUS
  Filled 2016-07-24 (×4): qty 100

## 2016-07-24 MED ORDER — ALBUMIN HUMAN 5 % IV SOLN
INTRAVENOUS | Status: DC | PRN
  Administered 2016-07-24 (×2): via INTRAVENOUS

## 2016-07-24 MED ORDER — DEXAMETHASONE SODIUM PHOSPHATE 10 MG/ML IJ SOLN
10.0000 mg | Freq: Two times a day (BID) | INTRAMUSCULAR | Status: AC
  Administered 2016-07-24 – 2016-07-25 (×3): 10 mg via INTRAVENOUS
  Filled 2016-07-24 (×3): qty 1

## 2016-07-24 MED ORDER — DOCUSATE SODIUM 100 MG PO CAPS: 100.0000 mg | ORAL_CAPSULE | Freq: Two times a day (BID) | ORAL | 0 refills | Status: DC

## 2016-07-24 MED ORDER — DIPHENHYDRAMINE HCL 12.5 MG/5ML PO ELIX: 12.5000 mg | ORAL_SOLUTION | ORAL | Status: DC | PRN

## 2016-07-24 MED ORDER — MAGNESIUM CITRATE PO SOLN: 1.0000 | Freq: Once | ORAL | Status: DC | PRN

## 2016-07-24 MED ORDER — FENTANYL CITRATE (PF) 250 MCG/5ML IJ SOLN
INTRAMUSCULAR | Status: DC | PRN
  Administered 2016-07-24: 50 ug via INTRAVENOUS

## 2016-07-24 MED ORDER — ALUM & MAG HYDROXIDE-SIMETH 200-200-20 MG/5ML PO SUSP: 30.0000 mL | ORAL | Status: DC | PRN

## 2016-07-24 MED ORDER — DOCUSATE SODIUM 100 MG PO CAPS
100.0000 mg | ORAL_CAPSULE | Freq: Two times a day (BID) | ORAL | Status: DC
  Administered 2016-07-24 – 2016-07-26 (×5): 100 mg via ORAL
  Filled 2016-07-24 (×5): qty 1

## 2016-07-24 MED ORDER — MIDAZOLAM HCL 2 MG/2ML IJ SOLN
INTRAMUSCULAR | Status: DC | PRN
  Administered 2016-07-24 (×2): 1 mg via INTRAVENOUS

## 2016-07-24 MED ORDER — EPHEDRINE SULFATE 50 MG/ML IJ SOLN
INTRAMUSCULAR | Status: DC | PRN
  Administered 2016-07-24: 15 mg via INTRAVENOUS
  Administered 2016-07-24: 10 mg via INTRAVENOUS

## 2016-07-24 MED ORDER — TIZANIDINE HCL 2 MG PO TABS: 2.0000 mg | ORAL_TABLET | Freq: Three times a day (TID) | ORAL | 0 refills | Status: DC | PRN

## 2016-07-24 MED ORDER — TRANEXAMIC ACID 1000 MG/10ML IV SOLN
1000.0000 mg | INTRAVENOUS | Status: AC
  Administered 2016-07-24: 1000 mg via INTRAVENOUS
  Filled 2016-07-24: qty 10

## 2016-07-24 MED ORDER — DEXTROSE 5 % IV SOLN
500.0000 mg | Freq: Four times a day (QID) | INTRAVENOUS | Status: DC | PRN
  Filled 2016-07-24: qty 5

## 2016-07-24 MED ORDER — PRAVASTATIN SODIUM 40 MG PO TABS
40.0000 mg | ORAL_TABLET | Freq: Every day | ORAL | Status: DC
  Administered 2016-07-24 – 2016-07-26 (×3): 40 mg via ORAL
  Filled 2016-07-24 (×3): qty 1

## 2016-07-24 MED ORDER — BUPIVACAINE HCL (PF) 0.5 % IJ SOLN
INTRAMUSCULAR | Status: AC
  Filled 2016-07-24: qty 30

## 2016-07-24 MED ORDER — BUPIVACAINE LIPOSOME 1.3 % IJ SUSP
20.0000 mL | INTRAMUSCULAR | Status: AC
  Administered 2016-07-24: 20 mL
  Filled 2016-07-24: qty 20

## 2016-07-24 MED ORDER — PROPOFOL 500 MG/50ML IV EMUL
INTRAVENOUS | Status: DC | PRN
  Administered 2016-07-24: 25 ug/kg/min via INTRAVENOUS

## 2016-07-24 MED ORDER — HYDROMORPHONE HCL 1 MG/ML IJ SOLN
0.5000 mg | INTRAMUSCULAR | Status: DC | PRN
  Administered 2016-07-25: 0.5 mg via INTRAVENOUS
  Filled 2016-07-24: qty 1

## 2016-07-24 MED ORDER — PROMETHAZINE HCL 25 MG/ML IJ SOLN: 6.2500 mg | INTRAMUSCULAR | Status: DC | PRN

## 2016-07-24 MED ORDER — ONDANSETRON HCL 4 MG PO TABS: 4.0000 mg | ORAL_TABLET | Freq: Four times a day (QID) | ORAL | Status: DC | PRN

## 2016-07-24 MED ORDER — BUPIVACAINE HCL 0.5 % IJ SOLN
INTRAMUSCULAR | Status: DC | PRN
  Administered 2016-07-24: 20 mL

## 2016-07-24 MED ORDER — SODIUM CHLORIDE 0.9 % IV SOLN
1000.0000 mg | Freq: Once | INTRAVENOUS | Status: AC
  Filled 2016-07-24: qty 10

## 2016-07-24 MED ORDER — ACETAMINOPHEN 325 MG PO TABS
650.0000 mg | ORAL_TABLET | Freq: Four times a day (QID) | ORAL | Status: DC | PRN
  Administered 2016-07-24 – 2016-07-25 (×2): 650 mg via ORAL
  Filled 2016-07-24 (×2): qty 2

## 2016-07-24 MED ORDER — MIDAZOLAM HCL 2 MG/2ML IJ SOLN
INTRAMUSCULAR | Status: AC
  Filled 2016-07-24: qty 2

## 2016-07-24 MED ORDER — HYDROMORPHONE HCL 1 MG/ML IJ SOLN
0.2500 mg | INTRAMUSCULAR | Status: DC | PRN
  Administered 2016-07-24: 0.5 mg via INTRAVENOUS

## 2016-07-24 MED ORDER — LISINOPRIL 20 MG PO TABS
20.0000 mg | ORAL_TABLET | Freq: Every day | ORAL | Status: DC
  Administered 2016-07-26: 20 mg via ORAL
  Filled 2016-07-24 (×2): qty 1

## 2016-07-24 MED ORDER — BUPIVACAINE HCL (PF) 0.5 % IJ SOLN
INTRAMUSCULAR | Status: DC | PRN
  Administered 2016-07-24: 3 mL via INTRATHECAL

## 2016-07-24 MED ORDER — FERROUS SULFATE 325 (65 FE) MG PO TABS
325.0000 mg | ORAL_TABLET | Freq: Every day | ORAL | Status: DC
  Administered 2016-07-24 – 2016-07-26 (×3): 325 mg via ORAL
  Filled 2016-07-24 (×3): qty 1

## 2016-07-24 MED ORDER — LORAZEPAM 0.5 MG PO TABS: 0.5000 mg | ORAL_TABLET | Freq: Every day | ORAL | Status: DC | PRN

## 2016-07-24 MED ORDER — CHLORHEXIDINE GLUCONATE 4 % EX LIQD: 60.0000 mL | Freq: Once | CUTANEOUS | Status: DC

## 2016-07-24 MED ORDER — LIDOCAINE 2% (20 MG/ML) 5 ML SYRINGE
INTRAMUSCULAR | Status: AC
  Filled 2016-07-24: qty 5

## 2016-07-24 MED ORDER — PROPOFOL 1000 MG/100ML IV EMUL
INTRAVENOUS | Status: AC
  Filled 2016-07-24: qty 100

## 2016-07-24 MED ORDER — CEPHALEXIN 500 MG PO CAPS: 500.0000 mg | ORAL_CAPSULE | Freq: Three times a day (TID) | ORAL | 0 refills | Status: DC

## 2016-07-24 MED ORDER — LIDOCAINE HCL (CARDIAC) 20 MG/ML IV SOLN
INTRAVENOUS | Status: DC | PRN
  Administered 2016-07-24: 40 mg via INTRATRACHEAL

## 2016-07-24 MED ORDER — ASPIRIN EC 325 MG PO TBEC: 325.0000 mg | DELAYED_RELEASE_TABLET | Freq: Two times a day (BID) | ORAL | 0 refills | Status: DC

## 2016-07-24 MED ORDER — FENTANYL CITRATE (PF) 250 MCG/5ML IJ SOLN
INTRAMUSCULAR | Status: AC
  Filled 2016-07-24: qty 5

## 2016-07-24 MED ORDER — HYDROMORPHONE HCL 1 MG/ML IJ SOLN
INTRAMUSCULAR | Status: AC
  Filled 2016-07-24: qty 0.5

## 2016-07-24 MED ORDER — OXYCODONE HCL 5 MG PO TABS
5.0000 mg | ORAL_TABLET | ORAL | Status: DC | PRN
  Administered 2016-07-24 (×3): 10 mg via ORAL
  Administered 2016-07-25: 5 mg via ORAL
  Administered 2016-07-25: 10 mg via ORAL
  Administered 2016-07-26 (×2): 5 mg via ORAL
  Filled 2016-07-24 (×3): qty 2
  Filled 2016-07-24 (×3): qty 1
  Filled 2016-07-24: qty 2

## 2016-07-24 MED ORDER — ONDANSETRON HCL 4 MG/2ML IJ SOLN: 4.0000 mg | Freq: Four times a day (QID) | INTRAMUSCULAR | Status: DC | PRN

## 2016-07-24 MED ORDER — PHENYLEPHRINE HCL 10 MG/ML IJ SOLN
INTRAVENOUS | Status: DC | PRN
  Administered 2016-07-24: 25 ug/min via INTRAVENOUS

## 2016-07-24 MED ORDER — BISACODYL 5 MG PO TBEC: 5.0000 mg | DELAYED_RELEASE_TABLET | Freq: Every day | ORAL | Status: DC | PRN

## 2016-07-24 MED ORDER — LACTATED RINGERS IV SOLN
INTRAVENOUS | Status: DC | PRN
  Administered 2016-07-24 (×2): via INTRAVENOUS

## 2016-07-24 MED ORDER — ACETAMINOPHEN 650 MG RE SUPP: 650.0000 mg | Freq: Four times a day (QID) | RECTAL | Status: DC | PRN

## 2016-07-24 MED ORDER — ONDANSETRON HCL 4 MG/2ML IJ SOLN
INTRAMUSCULAR | Status: AC
  Filled 2016-07-24: qty 2

## 2016-07-24 MED ORDER — POLYETHYLENE GLYCOL 3350 17 G PO PACK: 17.0000 g | PACK | Freq: Every day | ORAL | Status: DC | PRN

## 2016-07-24 MED ORDER — OXYCODONE-ACETAMINOPHEN 5-325 MG PO TABS: 1.0000 | ORAL_TABLET | Freq: Four times a day (QID) | ORAL | 0 refills | Status: DC | PRN

## 2016-07-24 MED ORDER — PHENYLEPHRINE 40 MCG/ML (10ML) SYRINGE FOR IV PUSH (FOR BLOOD PRESSURE SUPPORT)
PREFILLED_SYRINGE | INTRAVENOUS | Status: AC
  Filled 2016-07-24: qty 10

## 2016-07-24 MED ORDER — CEFAZOLIN SODIUM-DEXTROSE 2-4 GM/100ML-% IV SOLN
2.0000 g | Freq: Four times a day (QID) | INTRAVENOUS | Status: AC
  Administered 2016-07-24 – 2016-07-25 (×2): 2 g via INTRAVENOUS
  Filled 2016-07-24 (×2): qty 100

## 2016-07-24 MED ORDER — ONDANSETRON HCL 4 MG/2ML IJ SOLN
INTRAMUSCULAR | Status: DC | PRN
  Administered 2016-07-24: 4 mg via INTRAVENOUS

## 2016-07-24 MED ORDER — TRANEXAMIC ACID 1000 MG/10ML IV SOLN
1000.0000 mg | INTRAVENOUS | Status: DC
  Filled 2016-07-24: qty 10

## 2016-07-24 MED ORDER — ASPIRIN EC 325 MG PO TBEC
325.0000 mg | DELAYED_RELEASE_TABLET | Freq: Two times a day (BID) | ORAL | Status: DC
  Administered 2016-07-25 – 2016-07-26 (×3): 325 mg via ORAL
  Filled 2016-07-24 (×3): qty 1

## 2016-07-24 MED ORDER — GABAPENTIN 300 MG PO CAPS
300.0000 mg | ORAL_CAPSULE | Freq: Two times a day (BID) | ORAL | Status: DC
  Administered 2016-07-24 – 2016-07-26 (×5): 300 mg via ORAL
  Filled 2016-07-24 (×6): qty 1

## 2016-07-24 MED ORDER — PROPOFOL 10 MG/ML IV BOLUS
INTRAVENOUS | Status: DC | PRN
  Administered 2016-07-24: 20 mg via INTRAVENOUS

## 2016-07-24 MED ORDER — 0.9 % SODIUM CHLORIDE (POUR BTL) OPTIME
TOPICAL | Status: DC | PRN
  Administered 2016-07-24: 1000 mL

## 2016-07-24 MED ORDER — GLYCOPYRROLATE 0.2 MG/ML IJ SOLN
INTRAMUSCULAR | Status: DC | PRN
  Administered 2016-07-24 (×3): 0.2 mg via INTRAVENOUS

## 2016-07-24 SURGICAL SUPPLY — 60 items
APL SKNCLS STERI-STRIP NONHPOA (GAUZE/BANDAGES/DRESSINGS) ×1
BENZOIN TINCTURE PRP APPL 2/3 (GAUZE/BANDAGES/DRESSINGS) ×2 IMPLANT
BLADE CLIPPER SURG (BLADE) IMPLANT
BNDG COHESIVE 6X5 TAN STRL LF (GAUZE/BANDAGES/DRESSINGS) IMPLANT
BNDG GAUZE ELAST 4 BULKY (GAUZE/BANDAGES/DRESSINGS) IMPLANT
CAPT HIP TOTAL 2 ×2 IMPLANT
CELLS DAT CNTRL 66122 CELL SVR (MISCELLANEOUS) IMPLANT
CLSR STERI-STRIP ANTIMIC 1/2X4 (GAUZE/BANDAGES/DRESSINGS) IMPLANT
COVER PERINEAL POST (MISCELLANEOUS) ×2 IMPLANT
COVER SURGICAL LIGHT HANDLE (MISCELLANEOUS) ×2 IMPLANT
DRAPE C-ARM 42X72 X-RAY (DRAPES) ×2 IMPLANT
DRAPE STERI IOBAN 125X83 (DRAPES) ×2 IMPLANT
DRAPE U-SHAPE 47X51 STRL (DRAPES) ×4 IMPLANT
DRESSING AQUACEL AG SP 3.5X10 (GAUZE/BANDAGES/DRESSINGS) ×1 IMPLANT
DRSG AQUACEL AG ADV 3.5X10 (GAUZE/BANDAGES/DRESSINGS) ×2 IMPLANT
DRSG AQUACEL AG SP 3.5X10 (GAUZE/BANDAGES/DRESSINGS) ×2
DURAPREP 26ML APPLICATOR (WOUND CARE) ×2 IMPLANT
ELECT BLADE 4.0 EZ CLEAN MEGAD (MISCELLANEOUS)
ELECT CAUTERY BLADE 6.4 (BLADE) ×2 IMPLANT
ELECT REM PT RETURN 9FT ADLT (ELECTROSURGICAL) ×2
ELECTRODE BLDE 4.0 EZ CLN MEGD (MISCELLANEOUS) IMPLANT
ELECTRODE REM PT RTRN 9FT ADLT (ELECTROSURGICAL) ×1 IMPLANT
GLOVE BIOGEL PI IND STRL 8 (GLOVE) ×2 IMPLANT
GLOVE BIOGEL PI INDICATOR 8 (GLOVE) ×2
GLOVE ECLIPSE 7.5 STRL STRAW (GLOVE) ×4 IMPLANT
GOWN STRL REUS W/ TWL LRG LVL3 (GOWN DISPOSABLE) ×2 IMPLANT
GOWN STRL REUS W/ TWL XL LVL3 (GOWN DISPOSABLE) ×2 IMPLANT
GOWN STRL REUS W/TWL LRG LVL3 (GOWN DISPOSABLE) ×4
GOWN STRL REUS W/TWL XL LVL3 (GOWN DISPOSABLE) ×4
HOOD PEEL AWAY FACE SHEILD DIS (HOOD) ×4 IMPLANT
KIT BASIN OR (CUSTOM PROCEDURE TRAY) ×2 IMPLANT
KIT ROOM TURNOVER OR (KITS) ×2 IMPLANT
MANIFOLD NEPTUNE II (INSTRUMENTS) ×2 IMPLANT
NDL SPNL 22GX3.5 QUINCKE BK (NEEDLE) ×1 IMPLANT
NEEDLE SPNL 22GX3.5 QUINCKE BK (NEEDLE) ×2 IMPLANT
NS IRRIG 1000ML POUR BTL (IV SOLUTION) ×2 IMPLANT
PACK TOTAL JOINT (CUSTOM PROCEDURE TRAY) ×2 IMPLANT
PACK UNIVERSAL I (CUSTOM PROCEDURE TRAY) ×2 IMPLANT
PAD ARMBOARD 7.5X6 YLW CONV (MISCELLANEOUS) ×4 IMPLANT
RETRACTOR WND ALEXIS 18 MED (MISCELLANEOUS) IMPLANT
RTRCTR WOUND ALEXIS 18CM MED (MISCELLANEOUS)
RTRCTR WOUND ALEXIS 18CM SML (INSTRUMENTS) ×2
SAVER CELL AAL HAEMONETICS (INSTRUMENTS) ×1 IMPLANT
SAW OSC TIP CART 19.5X105X1.3 (SAW) ×2 IMPLANT
SPONGE LAP 18X18 X RAY DECT (DISPOSABLE) IMPLANT
STAPLER VISISTAT 35W (STAPLE) ×1 IMPLANT
STRIP CLOSURE SKIN 1/2X4 (GAUZE/BANDAGES/DRESSINGS) ×2 IMPLANT
SUT ETHIBOND NAB CT1 #1 30IN (SUTURE) ×4 IMPLANT
SUT MNCRL AB 3-0 PS2 18 (SUTURE) IMPLANT
SUT VIC AB 0 CT1 27 (SUTURE) ×2
SUT VIC AB 0 CT1 27XBRD ANBCTR (SUTURE) ×1 IMPLANT
SUT VIC AB 1 CT1 27 (SUTURE) ×4
SUT VIC AB 1 CT1 27XBRD ANBCTR (SUTURE) ×2 IMPLANT
SUT VIC AB 2-0 CT1 27 (SUTURE) ×2
SUT VIC AB 2-0 CT1 TAPERPNT 27 (SUTURE) ×1 IMPLANT
SYR 50ML LL SCALE MARK (SYRINGE) ×2 IMPLANT
TOWEL OR 17X24 6PK STRL BLUE (TOWEL DISPOSABLE) ×2 IMPLANT
TOWEL OR 17X26 10 PK STRL BLUE (TOWEL DISPOSABLE) ×2 IMPLANT
TRAY CATH 16FR W/PLASTIC CATH (SET/KITS/TRAYS/PACK) IMPLANT
TRAY FOLEY CATH SILVER 16FR (SET/KITS/TRAYS/PACK) ×2 IMPLANT

## 2016-07-24 NOTE — Anesthesia Procedure Notes (Signed)
Spinal  Patient location during procedure: OR Start time: 07/24/2016 7:34 AM Staffing Anesthesiologist: Mal Amabile Performed: anesthesiologist  Preanesthetic Checklist Completed: patient identified, site marked, surgical consent, pre-op evaluation, timeout performed, IV checked, risks and benefits discussed and monitors and equipment checked Spinal Block Patient position: sitting Prep: site prepped and draped and DuraPrep Patient monitoring: heart rate, cardiac monitor, continuous pulse ox and blood pressure Approach: midline Location: L3-4 Injection technique: single-shot Needle Needle type: Pencan  Needle gauge: 24 G Needle length: 9 cm Needle insertion depth: 5 cm Assessment Sensory level: T6 Additional Notes Patient tolerated procedure well. Adequate sensory level.

## 2016-07-24 NOTE — Transfer of Care (Signed)
Immediate Anesthesia Transfer of Care Note  Patient: [name]  Procedure(s) Performed: Procedure(s): RIGHT TOTAL HIP ARTHROPLASTY ANTERIOR APPROACH (Right)  Patient Location: PACU  Anesthesia Type:Spinal  Level of Consciousness: awake, alert , oriented and patient cooperative  Airway & Oxygen Therapy: Patient Spontanous Breathing and Patient connected to nasal cannula oxygen  Post-op Assessment: Report given to RN, Post -op Vital signs reviewed and stable and Patient able to stick tongue midline  Post vital signs: Reviewed and stable  Last Vitals:  Vitals:   07/24/16 1000  BP: (!) 76/53  Pulse: 72  Resp: 20  Temp: (!) 36 C    Last Pain: There were no vitals filed for this visit.       Complications: No apparent anesthesia complications

## 2016-07-24 NOTE — Anesthesia Postprocedure Evaluation (Signed)
Anesthesia Post Note  Patient: [name]  Procedure(s) Performed: Procedure(s) (LRB): RIGHT TOTAL HIP ARTHROPLASTY ANTERIOR APPROACH (Right)  Patient location during evaluation: PACU Anesthesia Type: Spinal Level of consciousness: awake and alert and oriented Pain management: pain level controlled Vital Signs Assessment: post-procedure vital signs reviewed and stable Respiratory status: spontaneous breathing, nonlabored ventilation, respiratory function stable and patient connected to nasal cannula oxygen Cardiovascular status: blood pressure returned to baseline and stable Postop Assessment: no headache, no backache, spinal receding and no signs of nausea or vomiting Anesthetic complications: no       Last Vitals:  Vitals:   07/24/16 1045 07/24/16 1100  BP: 98/62 94/67  Pulse: 77 76  Resp: 18 20  Temp:      Last Pain: There were no vitals filed for this visit.               Curley Hogen A.

## 2016-07-24 NOTE — Brief Op Note (Signed)
07/24/2016  9:33 AM  PATIENT:  [name]  76 y.o. female  PRE-OPERATIVE DIAGNOSIS:  BILATERAL HIP OSTEOARTHRITIS  POST-OPERATIVE DIAGNOSIS:  BILATERAL HIP OSTEOARTHRITIS  PROCEDURE:  Procedure(s): RIGHT TOTAL HIP ARTHROPLASTY ANTERIOR APPROACH (Right)  SURGEON:  Surgeon(s) and Role:    * [name], MD - Primary  PHYSICIAN ASSISTANT:   ASSISTANTS: jim bethune   ANESTHESIA:   general  EBL:  Total I/O In: 2000 [I.V.:2000] Out: 1200 [Urine:600; Blood:600]  BLOOD ADMINISTERED:none  DRAINS: none   LOCAL MEDICATIONS USED:  MARCAINE    and OTHER experel  SPECIMEN:  No Specimen  DISPOSITION OF SPECIMEN:  N/A  COUNTS:  YES  TOURNIQUET:  * No tourniquets in log *  DICTATION: .Other Dictation: Dictation Number [id number]  PLAN OF CARE: Admit to inpatient   PATIENT DISPOSITION:  PACU - hemodynamically stable.   Delay start of Pharmacological VTE agent (>24hrs) due to surgical blood loss or risk of bleeding: no

## 2016-07-24 NOTE — H&P (Signed)
TOTAL HIP ADMISSION H&P  Patient is admitted for right total hip arthroplasty.  Subjective:  Chief Complaint: bilaterally hip pain  HPI: [name], 76 y.o. female, has a history of pain and functional disability in the bilaterally hip(s) due to arthritis and patient has failed non-surgical conservative treatments for greater than 12 weeks to include NSAID's and/or analgesics, flexibility and strengthening excercises, supervised PT with diminished ADL's post treatment, weight reduction as appropriate and activity modification.  Onset of symptoms was gradual starting 5 years ago with gradually worsening course since that time.The patient noted no past surgery on the bilaterally hip(s).  Patient currently rates pain in the bilaterally hip at 10 out of 10 with activity. Patient has night pain, worsening of pain with activity and weight bearing, trendelenberg gait, pain that interfers with activities of daily living, pain with passive range of motion, crepitus and joint swelling. Patient has evidence of subchondral sclerosis, periarticular osteophytes, joint subluxation and joint space narrowing by imaging studies. This condition presents safety issues increasing the risk of falls. This patient has had all reasonable conservative care.  There is no current active infection.  Patient Active Problem List   Diagnosis Date Noted  . Primary osteoarthritis of right hip 11/10/2015  . Anxiety state 11/10/2015  . Preoperative cardiovascular examination 07/23/2014  . Morbid obesity - BMI 46 07/18/2013  . Bilateral edema of lower extremity 07/18/2013    Class: Chronic  . Metabolic syndrome 40/98/1191  . Rectal bleeding 07/07/2012  . Essential hypertension 07/07/2012  . GERD (gastroesophageal reflux disease) 07/07/2012  . Hyperlipidemia with target LDL less than 100 07/07/2012  . Rotator cuff tear, left 03/02/2012  . Osteoarthritis of left shoulder 01/21/2012   Past Medical History:  Diagnosis Date   . Anemia    on iron  . Anxiety   . Arthritis    shoulders, knees, hands, hips; status post left knee surgery  . Constipation   . Depression   . GERD (gastroesophageal reflux disease)    TAKES TUMS  . H/O: pneumonia 2003  . Heart murmur    ???   YEARS AGO  . Hyperlipidemia    on med  . Hypertension   . Morbid obesity with BMI of 45.0-49.9, adult ([place])   . Pneumonia    15-38YRS    Past Surgical History:  Procedure Laterality Date  . CARDIAC CATHETERIZATION  04/20/1996   patent coronaries, EF 74% (Dr. Domenic Moras)  . CARPAL TUNNEL RELEASE Left   . CATARACT EXTRACTION, BILATERAL    . COLONOSCOPY    . KNEE SURGERY Left 11/2001  . REPLACEMENT TOTAL KNEE Left 10/2009  . REPLACEMENT TOTAL KNEE Left 10/2009  . SHOULDER OPEN ROTATOR CUFF REPAIR  03/01/2012   Procedure: ROTATOR CUFF REPAIR SHOULDER OPEN;  Surgeon: Nita Sells, MD;  Location: [facility];  Service: Orthopedics;  Laterality: Left;  SUBSCAPULARIS REPAIR  . STRESS TEST - ADENOSINE THALLIUM  1997   apical and inferior segments with questionable infarct and ischemia in anteroseptal & inferoseptal segments (Dr. Aileen Fass)  . SVD     x 7  . TOTAL SHOULDER ARTHROPLASTY  01/18/2012   Procedure: TOTAL SHOULDER ARTHROPLASTY;  Surgeon: Nita Sells, MD;  Location: [facility];  Service: Orthopedics;  Laterality: Left;  . TOTAL SHOULDER REPLACEMENT Left 01/19/2012  . TRANSTHORACIC ECHOCARDIOGRAM  06/2011   LV mildly dilated, EF=>55%; mild mitral annular calcif, mild MR; mild TR; AV mildly sclerotic, mild aortic regurg  . TUBAL LIGATION    .  WISDOM TOOTH EXTRACTION      Prescriptions Prior to Admission  Medication Sig Dispense Refill Last Dose  . aspirin EC 81 MG tablet Take 81 mg by mouth daily.   Past Week at Unknown time  . Ferrous Sulfate (IRON) 325 (65 Fe) MG TABS Take 1 tablet by mouth every morning.   07/23/2016 at Unknown time  . HYDROcodone-acetaminophen (NORCO/VICODIN) 5-325 MG tablet Take 1 tablet by mouth 2  (two) times daily.   07/24/2016 at 0330  . lisinopril (PRINIVIL,ZESTRIL) 20 MG tablet TAKE 1 TABLET BY MOUTH EVERY DAY 90 tablet 0 07/23/2016 at Unknown time  . LORazepam (ATIVAN) 0.5 MG tablet Take 1 tablet (0.5 mg total) by mouth daily as needed for anxiety. 30 tablet 5 07/24/2016 at 0330  . nitrofurantoin (MACRODANTIN) 100 MG capsule Take 100 mg by mouth 2 (two) times daily.   07/23/2016 at Unknown time  . pravastatin (PRAVACHOL) 40 MG tablet Take 1 tablet (40 mg total) by mouth 2 (two) times daily. (Patient taking differently: Take 40 mg by mouth daily. ) 180 tablet 1 07/23/2016 at Unknown time   Allergies  Allergen Reactions  . Effexor [Venlafaxine Hcl] Swelling    MD ADVISES PATIENT TO NOT TAKE IN FUTURE PT STOPPED, MD DC'd MED  . Sulfa Drugs Cross Reactors Itching    Social History  Substance Use Topics  . Smoking status: Former Smoker    Packs/day: 1.00    Years: 25.00    Types: Cigarettes    Quit date: 03/07/1984  . Smokeless tobacco: Former Systems developer  . Alcohol use No    Family History  Problem Relation Age of Onset  . Heart disease Mother   . Heart disease Father   . Heart disease Sister   . Hypertension Sister   . Heart disease Sister   . Hypertension Sister   . Heart disease Sister   . Hypertension Sister      ROS  ROS: I have reviewed the patient's review of systems thoroughly and there are no positive responses as relates to the HPI. Objective:  Physical Exam  Vital signs in last 24 hours: Weight:  [87.1 kg (192 lb)] 87.1 kg (192 lb) (04/13 0627) Well-developed well-nourished patient in no acute distress. Alert and oriented x3 HEENT:within normal limits Cardiac: Regular rate and rhythm Pulmonary: Lungs clear to auscultation Abdomen: Soft and nontender.  Normal active bowel sounds  Musculoskeletal: (bilateral hips: painful ROM limited ROM Labs:  Recent Results (from the past 2160 hour(s))  Surgical pcr screen     Status: None   Collection Time: 07/08/16  10:47 AM  Result Value Ref Range   MRSA, PCR NEGATIVE NEGATIVE   Staphylococcus aureus NEGATIVE NEGATIVE    Comment:        The Xpert SA Assay (FDA approved for NASAL specimens in patients over 26 years of age), is one component of a comprehensive surveillance program.  Test performance has been validated by [facility] for patients greater than or equal to 66 year old. It is not intended to diagnose infection nor to guide or monitor treatment.   APTT     Status: None   Collection Time: 07/08/16 10:48 AM  Result Value Ref Range   aPTT 31 24 - 36 seconds  CBC WITH DIFFERENTIAL     Status: None   Collection Time: 07/08/16 10:48 AM  Result Value Ref Range   WBC 7.2 4.0 - 10.5 K/uL   RBC 4.30 3.87 - 5.11 MIL/uL   Hemoglobin 12.7  12.0 - 15.0 g/dL   HCT 40.0 36.0 - 46.0 %   MCV 93.0 78.0 - 100.0 fL   MCH 29.5 26.0 - 34.0 pg   MCHC 31.8 30.0 - 36.0 g/dL   RDW 15.3 11.5 - 15.5 %   Platelets 293 150 - 400 K/uL   Neutrophils Relative % 53 %   Neutro Abs 3.8 1.7 - 7.7 K/uL   Lymphocytes Relative 38 %   Lymphs Abs 2.7 0.7 - 4.0 K/uL   Monocytes Relative 7 %   Monocytes Absolute 0.5 0.1 - 1.0 K/uL   Eosinophils Relative 2 %   Eosinophils Absolute 0.1 0.0 - 0.7 K/uL   Basophils Relative 0 %   Basophils Absolute 0.0 0.0 - 0.1 K/uL  Comprehensive metabolic panel     Status: None   Collection Time: 07/08/16 10:48 AM  Result Value Ref Range   Sodium 140 135 - 145 mmol/L   Potassium 4.1 3.5 - 5.1 mmol/L   Chloride 105 101 - 111 mmol/L   CO2 26 22 - 32 mmol/L   Glucose, Bld 94 65 - 99 mg/dL   BUN 11 6 - 20 mg/dL   Creatinine, Ser 0.54 0.44 - 1.00 mg/dL   Calcium 10.3 8.9 - 10.3 mg/dL   Total Protein 7.7 6.5 - 8.1 g/dL   Albumin 4.1 3.5 - 5.0 g/dL   AST 24 15 - 41 U/L   ALT 15 14 - 54 U/L   Alkaline Phosphatase 65 38 - 126 U/L   Total Bilirubin 0.9 0.3 - 1.2 mg/dL   GFR calc non Af Amer >60 >60 mL/min   GFR calc Af Amer >60 >60 mL/min    Comment: (NOTE) The eGFR has been  calculated using the CKD EPI equation. This calculation has not been validated in all clinical situations. eGFR's persistently <60 mL/min signify possible Chronic Kidney Disease.    Anion gap 9 5 - 15  Protime-INR     Status: None   Collection Time: 07/08/16 10:48 AM  Result Value Ref Range   Prothrombin Time 14.2 11.4 - 15.2 seconds   INR 1.10   Urinalysis, Routine w reflex microscopic     Status: Abnormal   Collection Time: 07/08/16 10:48 AM  Result Value Ref Range   Color, Urine YELLOW YELLOW   APPearance HAZY (A) CLEAR   Specific Gravity, Urine 1.011 1.005 - 1.030   pH 8.0 5.0 - 8.0   Glucose, UA NEGATIVE NEGATIVE mg/dL   Hgb urine dipstick SMALL (A) NEGATIVE   Bilirubin Urine NEGATIVE NEGATIVE   Ketones, ur NEGATIVE NEGATIVE mg/dL   Protein, ur NEGATIVE NEGATIVE mg/dL   Nitrite POSITIVE (A) NEGATIVE   Leukocytes, UA MODERATE (A) NEGATIVE   RBC / HPF 0-5 0 - 5 RBC/hpf   WBC, UA 6-30 0 - 5 WBC/hpf   Bacteria, UA FEW (A) NONE SEEN   Squamous Epithelial / LPF 6-30 (A) NONE SEEN   WBC Clumps PRESENT   Type and screen Order type and screen if day of surgery is less than 15 days from draw of preadmission visit or order morning of surgery if day of surgery is greater than 6 days from preadmission visit.     Status: None   Collection Time: 07/08/16 11:05 AM  Result Value Ref Range   ABO/RH(D) O POS    Antibody Screen NEG    Sample Expiration 07/22/2016    Extend sample reason NO TRANSFUSIONS OR PREGNANCY IN THE PAST 3 MONTHS   Lipid panel  Status: Abnormal   Collection Time: 07/10/16 11:03 AM  Result Value Ref Range   Cholesterol, Total 201 (H) 100 - 199 mg/dL   Triglycerides 60 0 - 149 mg/dL   HDL 78 >39 mg/dL   VLDL Cholesterol Cal 12 5 - 40 mg/dL   LDL Calculated 111 (H) 0 - 99 mg/dL   Chol/HDL Ratio 2.6 0.0 - 4.4 ratio    Comment:                                   T. Chol/HDL Ratio                                             Men  Women                                1/2 Avg.Risk  3.4    3.3                                   Avg.Risk  5.0    4.4                                2X Avg.Risk  9.6    7.1                                3X Avg.Risk 23.4   11.0   Type and screen [place]     Status: None   Collection Time: 07/24/16  6:10 AM  Result Value Ref Range   ABO/RH(D) O POS    Antibody Screen NEG    Sample Expiration 07/27/2016   CBC with Differential/Platelet     Status: None   Collection Time: 07/24/16  6:14 AM  Result Value Ref Range   WBC 7.4 4.0 - 10.5 K/uL   RBC 4.16 3.87 - 5.11 MIL/uL   Hemoglobin 12.1 12.0 - 15.0 g/dL   HCT 39.2 36.0 - 46.0 %   MCV 94.2 78.0 - 100.0 fL   MCH 29.1 26.0 - 34.0 pg   MCHC 30.9 30.0 - 36.0 g/dL   RDW 15.5 11.5 - 15.5 %   Platelets 303 150 - 400 K/uL   Neutrophils Relative % 67 %   Neutro Abs 5.0 1.7 - 7.7 K/uL   Lymphocytes Relative 19 %   Lymphs Abs 1.4 0.7 - 4.0 K/uL   Monocytes Relative 7 %   Monocytes Absolute 0.6 0.1 - 1.0 K/uL   Eosinophils Relative 6 %   Eosinophils Absolute 0.4 0.0 - 0.7 K/uL   Basophils Relative 1 %   Basophils Absolute 0.0 0.0 - 0.1 K/uL  Comprehensive metabolic panel     Status: Abnormal   Collection Time: 07/24/16  6:14 AM  Result Value Ref Range   Sodium 139 135 - 145 mmol/L   Potassium 4.1 3.5 - 5.1 mmol/L   Chloride 102 101 - 111 mmol/L   CO2 27 22 - 32 mmol/L   Glucose, Bld 102 (H) 65 - 99 mg/dL   BUN 12 6 - 20  mg/dL   Creatinine, Ser 0.58 0.44 - 1.00 mg/dL   Calcium 10.6 (H) 8.9 - 10.3 mg/dL   Total Protein 7.5 6.5 - 8.1 g/dL   Albumin 4.1 3.5 - 5.0 g/dL   AST 23 15 - 41 U/L   ALT 14 14 - 54 U/L   Alkaline Phosphatase 67 38 - 126 U/L   Total Bilirubin 0.9 0.3 - 1.2 mg/dL   GFR calc non Af Amer >60 >60 mL/min   GFR calc Af Amer >60 >60 mL/min    Comment: (NOTE) The eGFR has been calculated using the CKD EPI equation. This calculation has not been validated in all clinical situations. eGFR's persistently <60 mL/min signify possible  Chronic Kidney Disease.    Anion gap 10 5 - 15  Protime-INR     Status: None   Collection Time: 07/24/16  6:14 AM  Result Value Ref Range   Prothrombin Time 13.9 11.4 - 15.2 seconds   INR 1.06   APTT     Status: None   Collection Time: 07/24/16  6:14 AM  Result Value Ref Range   aPTT 31 24 - 36 seconds  Urinalysis, Routine w reflex microscopic     Status: Abnormal   Collection Time: 07/24/16  6:37 AM  Result Value Ref Range   Color, Urine YELLOW YELLOW   APPearance CLOUDY (A) CLEAR   Specific Gravity, Urine 1.010 1.005 - 1.030   pH 8.0 5.0 - 8.0   Glucose, UA NEGATIVE NEGATIVE mg/dL   Hgb urine dipstick SMALL (A) NEGATIVE   Bilirubin Urine NEGATIVE NEGATIVE   Ketones, ur NEGATIVE NEGATIVE mg/dL   Protein, ur NEGATIVE NEGATIVE mg/dL   Nitrite NEGATIVE NEGATIVE   Leukocytes, UA LARGE (A) NEGATIVE   RBC / HPF 0-5 0 - 5 RBC/hpf   WBC, UA TOO NUMEROUS TO COUNT 0 - 5 WBC/hpf   Bacteria, UA RARE (A) NONE SEEN   Squamous Epithelial / LPF 0-5 (A) NONE SEEN   Estimated body mass index is 37.5 kg/m as calculated from the following:   Height as of this encounter: 5' (1.524 m).   Weight as of this encounter: 87.1 kg (192 lb).   Imaging Review Plain radiographs demonstrate severe degenerative joint disease of the bilateral hip(s). The bone quality appears to be fair for age and reported activity level.  Assessment/Plan:  End stage arthritis, bilaterally hip(s)  The patient history, physical examination, clinical judgement of the provider and imaging studies are consistent with end stage degenerative joint disease of the bilaterally hip(s) and total hip arthroplasty is deemed medically necessary. The treatment options including medical management, injection therapy, arthroscopy and arthroplasty were discussed at length. The risks and benefits of total hip arthroplasty were presented and reviewed. The risks due to aseptic loosening, infection, stiffness, dislocation/subluxation,   thromboembolic complications and other imponderables were discussed.  The patient acknowledged the explanation, agreed to proceed with the plan and consent was signed. Patient is being admitted for inpatient treatment for surgery, pain control, PT, OT, prophylactic antibiotics, VTE prophylaxis, progressive ambulation and ADL's and discharge planning.The patient is planning to be discharged home with home health services

## 2016-07-24 NOTE — Discharge Instructions (Signed)

## 2016-07-24 NOTE — Evaluation (Signed)
Physical Therapy Evaluation Patient Details Name: [name] MRN: [id number] DOB: 1941-03-09 Today's Date: 07/24/2016   History of Present Illness  Pt is a 76 yo female admitted on 07/24/16 for elective right THA. PMH significant for L TKA 2011, GERD, HTN, HLD, B LE edema, obesity, anxiety.   Clinical Impression  Pt is POD 0 and moving slowly with therapy. Prior to admission, pt lived with her daughter and was independent with AD. Pt has already set up to go to [facility] for short term rehab. Pt is able to move with Min to Mod A this session due to pain and post surgical weakness. Pt will benefit from continued acute PT services in order to address the below deficits prior to DC to venue recommended below.     Follow Up Recommendations SNF;Supervision/Assistance - 24 hour    Equipment Recommendations  None recommended by PT    Recommendations for Other Services OT consult     Precautions / Restrictions Precautions Precautions: Fall Restrictions Weight Bearing Restrictions: Yes RLE Weight Bearing: Weight bearing as tolerated      Mobility  Bed Mobility Overal bed mobility: Needs Assistance Bed Mobility: Supine to Sit     Supine to sit: Mod assist;HOB elevated     General bed mobility comments: Mod A with use of pads to bring LE's and bottom EOB. Pt does assist toward end and does decrease to Min A.   Transfers Overall transfer level: Needs assistance Equipment used: Rolling walker (2 wheeled) Transfers: Sit to/from [employer] Sit to Stand: Mod assist Stand pivot transfers: Min assist       General transfer comment: Mod A to stand from EOB initially and Min A to transfer to recliner with cues for sequencing and for hand placement when sitting in recliner  Ambulation/Gait                Stairs            Wheelchair Mobility    Modified Rankin (Stroke Patients Only)       Balance Overall balance assessment: Needs  assistance Sitting-balance support: No upper extremity supported;Feet supported Sitting balance-Leahy Scale: Fair     Standing balance support: Bilateral upper extremity supported;During functional activity Standing balance-Leahy Scale: Poor Standing balance comment: relies on Rw for stability with movement                             Pertinent Vitals/Pain Pain Assessment: Faces Faces Pain Scale: Hurts little more Pain Location: right hip (anterior thigh) with mobility Pain Descriptors / Indicators: Grimacing;Guarding;Discomfort Pain Intervention(s): Monitored during session;Premedicated before session;Repositioned;Ice applied    Home Living Family/patient expects to be discharged to:: Other (Comment) [facility] for rehab) Living Arrangements: Children               Additional Comments: already scheduled to go to [facility], already has walker, cane, and 3N1 commode at home.     Prior Function Level of Independence: Independent with assistive device(s)         Comments: was independent with AD, lives with her daughter currently. Able to do for herself     Hand Dominance   Dominant Hand: Right    Extremity/Trunk Assessment   Upper Extremity Assessment Upper Extremity Assessment: Defer to OT evaluation    Lower Extremity Assessment Lower Extremity Assessment: RLE deficits/detail RLE Deficits / Details: pt with normal post op pain and weakness. At least  3/5 and knee, 2/5 hip per gross functional assessment.        Communication   Communication: No difficulties  Cognition Arousal/Alertness: Awake/alert Behavior During Therapy: WFL for tasks assessed/performed;Anxious Overall Cognitive Status: Within Functional Limits for tasks assessed                                        General Comments      Exercises Total Joint Exercises Ankle Circles/Pumps: AROM;Both;20 reps;Supine   Assessment/Plan    PT Assessment Patient  needs continued PT services  PT Problem List Decreased strength;Decreased activity tolerance;Decreased balance;Decreased mobility;Decreased range of motion;Decreased knowledge of use of DME;Pain       PT Treatment Interventions DME instruction;Gait training;Stair training;Functional mobility training;Therapeutic activities;Therapeutic exercise;Balance training;Patient/family education    PT Goals (Current goals can be found in the Care Plan section)  Acute Rehab PT Goals Patient Stated Goal: to go to rehab and then home PT Goal Formulation: With patient Time For Goal Achievement: 07/31/16 Potential to Achieve Goals: Good    Frequency 7X/week   Barriers to discharge        Co-evaluation               End of Session Equipment Utilized During Treatment: Gait belt Activity Tolerance: Patient limited by fatigue Patient left: in chair;with call bell/phone within reach;with family/visitor present Nurse Communication: Mobility status PT Visit Diagnosis: Difficulty in walking, not elsewhere classified (R26.2);Pain Pain - Right/Left: Right Pain - part of body: Hip    Time: 1515-1540 PT Time Calculation (min) (ACUTE ONLY): 25 min   Charges:   PT Evaluation $PT Eval Moderate Complexity: 1 Procedure PT Treatments $Therapeutic Activity: 8-22 mins   PT G Codes:        Colin Broach PT, DPT  [phone]   [name] 07/24/2016, 4:05 PM

## 2016-07-24 NOTE — Op Note (Signed)
NAME:  [name], [name]                     ACCOUNT NO.:  MEDICAL RECORD NO.:  [id number]  LOCATION:                                 FACILITY:  PHYSICIAN:  Harvie Junior, M.D.        DATE OF BIRTH:  DATE OF PROCEDURE:  07/24/2016 DATE OF DISCHARGE:                              OPERATIVE REPORT   PREOPERATIVE DIAGNOSIS:  End-stage degenerative joint disease, bilateral hip.  POSTOPERATIVE DIAGNOSIS:  End-stage degenerative joint disease, bilateral hip.  PROCEDURE: 1. Right total hip replacement with a Corail stem size 9 with a 32-mm     +1 delta ceramic hip ball and a 50-mm Pinnacle cup Gription, no     holes, and a +4 neutral liner. 2. Interpretation of multiple intraoperative fluoroscopic images.  SURGEON:  Harvie Junior, M.D.  ASSISTANT:  [name] [name], P.A.  ANESTHESIA:  General.  BRIEF HISTORY:  Ms. [name] is a 76 year old female with a long history of severe bilateral hip arthritis.  We treated her previously with total knee replacement, and she had done well with that, although she did have some wound complications with one of her surgeries.  She was brought to the operating room for right total hip replacement after failure of conservative care.  She has had a BMI well over 40, and we had to work with her aggressively on weight loss and weight control.  We did get her BMI down to under 40 prior to the surgical intervention and felt that this would give her the least amount of risk surgically.  She was brought to the operating room for this procedure.  DESCRIPTION OF PROCEDURE:  The patient was brought to the operating room.  After adequate anesthesia was obtained with general anesthetic, the patient was placed supine on the operating table, was moved onto the Hana bed and all bony prominences were well padded, and boots were put in place.  Once this was completed, attention was turned to the right hip where after routine prep and drape, an incision was made for  an anterior approach to the hip, subcutaneous tissue down to the level of the tensor fascia was identified and the fascia was opened and retracted, and following this, the vessels were cauterized going in anterior aspect of the head.  The capsule was identified and opened and tagged, and provisional neck cut was made with fluoroscopic guidance. Once this was done, the head was removed.  Measured to 46 on the back table.  The acetabulum was exposed and sequentially reamed to a level of 49 and a 50-mm Gription hole-less cup was taken and placed in 45 degrees of lateral opening and 30 degrees of anteversion.  Once this was completed, attention was turned towards the placement of a hole eliminator, and a +4 neutral liner.  We then moved to the stem side and with the hip externally rotated and adducted, we were able to open the canal and sequentially rasped the stem up to a level of size 9, it was an excellent fit, radiographically an excellent fit.  Put a +0 trial on the 9 rasp and did a closed reduction.  Made her  about 5 to 7 mm longer than the other side which we felt was appropriate and this gave her excellent stability and range of motion.  We tested with 60 degrees of extension and 90 of external rotation, completely stable.  At this point, we brought the leg back up, dislocated the hip, removed the trial stem, put in the final size 9 and put the +0 ball on this, and I did a closed reduction and excellent stability was achieved at this point.  At this point had the Exparel placed all throughout the capsular tissues. We closed the anterior capsule and then closed the tensor fascia, closed the subcu and used skin staples on the skin just given her large size and excess adipose tissue.  At this point, the sterile compressive dressing was applied, and the patient was taken to the recovery room, and she was noted to be in satisfactory condition.  Estimated blood loss for the procedure was  about 600 mL.  The final accurate information can be gotten from anesthetic record.  There were no obvious complications. The patient was taken to the recovery room, and she was noted to be in satisfactory condition.     Harvie Junior, M.D.     [name] [name]  D:  07/24/2016  T:  07/24/2016  Job:  [id number]

## 2016-07-25 LAB — CBC
HEMATOCRIT: 28.8 % — AB (ref 36.0–46.0)
HEMOGLOBIN: 9 g/dL — AB (ref 12.0–15.0)
MCH: 29 pg (ref 26.0–34.0)
MCHC: 31.3 g/dL (ref 30.0–36.0)
MCV: 92.9 fL (ref 78.0–100.0)
Platelets: 215 10*3/uL (ref 150–400)
RBC: 3.1 MIL/uL — AB (ref 3.87–5.11)
RDW: 15.6 % — ABNORMAL HIGH (ref 11.5–15.5)
WBC: 10.6 10*3/uL — AB (ref 4.0–10.5)

## 2016-07-25 LAB — BASIC METABOLIC PANEL
ANION GAP: 6 (ref 5–15)
BUN: 8 mg/dL (ref 6–20)
CHLORIDE: 107 mmol/L (ref 101–111)
CO2: 26 mmol/L (ref 22–32)
Calcium: 9 mg/dL (ref 8.9–10.3)
Creatinine, Ser: 0.71 mg/dL (ref 0.44–1.00)
GFR calc Af Amer: >60 mL/min (ref >60)
GLUCOSE: 171 mg/dL — AB (ref 65–99)
POTASSIUM: 4.8 mmol/L (ref 3.5–5.1)
Sodium: 139 mmol/L (ref 135–145)

## 2016-07-25 NOTE — NC FL2 (Signed)
[place] MEDICAID FL2 LEVEL OF CARE SCREENING TOOL     IDENTIFICATION  Patient Name: [name] Birthdate: 02/28/1941 Sex: female Admission Date (Current Location): 07/24/2016  [facility] and [place] Number:  Producer, television/film/video and Address:  [facility], [address] [place], [place] [address]      Provider Number: [id number]  Attending Physician Name and Address:  [name], MD  Relative Name and Phone Number:       Current Level of Care: Hospital Recommended Level of Care: Skilled Nursing Facility Prior Approval Number:    Date Approved/Denied:   PASRR Number: [id number]  Discharge Plan: SNF    Current Diagnoses: Patient Active Problem List   Diagnosis Date Noted  . Primary osteoarthritis of right hip 11/10/2015  . Anxiety state 11/10/2015  . Preoperative cardiovascular examination 07/23/2014  . Morbid obesity - BMI 46 07/18/2013  . Bilateral edema of lower extremity 07/18/2013    Class: Chronic  . Metabolic syndrome 07/18/2013  . Rectal bleeding 07/07/2012  . Essential hypertension 07/07/2012  . GERD (gastroesophageal reflux disease) 07/07/2012  . Hyperlipidemia with target LDL less than 100 07/07/2012  . Rotator cuff tear, left 03/02/2012  . Osteoarthritis of left shoulder 01/21/2012    Orientation RESPIRATION BLADDER Height & Weight     Self, Time, Situation, Place  Normal Continent Weight: 192 lb (87.1 kg) Height:   (162.6 cm)  BEHAVIORAL SYMPTOMS/MOOD NEUROLOGICAL BOWEL NUTRITION STATUS      Continent  (Please see d/c summary)  AMBULATORY STATUS COMMUNICATION OF NEEDS Skin   Limited Assist (Pt currently ambulating with rolling walker, min assist) Verbally Surgical wounds (Closed incision right hip, Hydrocolloid dressing)                       Personal Care Assistance Level of Assistance  Bathing, Feeding, Dressing Bathing Assistance: Limited assistance Feeding assistance: Independent Dressing Assistance:  Limited assistance     Functional Limitations Info  Sight, Hearing, Speech Sight Info: Adequate Hearing Info: Adequate Speech Info: Adequate    SPECIAL CARE FACTORS FREQUENCY  PT (By licensed PT), OT (By licensed OT)     PT Frequency: 7x/ week OT Frequency: 7x/ week            Contractures Contractures Info: Not present    Additional Factors Info  Code Status, Allergies Code Status Info: Full Code Allergies Info: Effexor Venlafaxine Hcl, Sulfa Drugs Cross Reactors           Current Medications (07/25/2016):  This is the current hospital active medication list Current Facility-Administered Medications  Medication Dose Route Frequency Provider Last Rate Last Dose  . 0.9 %  sodium chloride infusion   Intravenous Continuous Marshia Ly, PA-C 100 mL/hr at 07/25/16 0211    . acetaminophen (TYLENOL) tablet 650 mg  650 mg Oral Q6H PRN Marshia Ly, PA-C   650 mg at 07/25/16 0825   Or  . acetaminophen (TYLENOL) suppository 650 mg  650 mg Rectal Q6H PRN Marshia Ly, PA-C      . alum & mag hydroxide-simeth (MAALOX/MYLANTA) 200-200-20 MG/5ML suspension 30 mL  30 mL Oral Q4H PRN Marshia Ly, PA-C      . aspirin EC tablet 325 mg  325 mg Oral BID PC Marshia Ly, PA-C   325 mg at 07/25/16 0825  . bisacodyl (DULCOLAX) EC tablet 5 mg  5 mg Oral Daily PRN Marshia Ly, PA-C      . diphenhydrAMINE (BENADRYL) 12.5  MG/5ML elixir 12.5-25 mg  12.5-25 mg Oral Q4H PRN Marshia Ly, PA-C      . docusate sodium (COLACE) capsule 100 mg  100 mg Oral BID Marshia Ly, PA-C   100 mg at 07/25/16 1040  . ferrous sulfate tablet 325 mg  325 mg Oral Daily Marshia Ly, PA-C   325 mg at 07/25/16 1040  . gabapentin (NEURONTIN) capsule 300 mg  300 mg Oral BID Marshia Ly, PA-C   300 mg at 07/25/16 1040  . HYDROmorphone (DILAUDID) injection 0.5 mg  0.5 mg Intravenous Q2H PRN Marshia Ly, PA-C   0.5 mg at 07/25/16 0732  . lisinopril (PRINIVIL,ZESTRIL) tablet 20 mg  20 mg Oral Daily Marshia Ly,  PA-C      . LORazepam (ATIVAN) tablet 0.5 mg  0.5 mg Oral Daily PRN Marshia Ly, PA-C      . magnesium citrate solution 1 Bottle  1 Bottle Oral Once PRN Marshia Ly, PA-C      . methocarbamol (ROBAXIN) tablet 500 mg  500 mg Oral Q6H PRN Marshia Ly, PA-C   500 mg at 07/24/16 1317   Or  . methocarbamol (ROBAXIN) 500 mg in dextrose 5 % 50 mL IVPB  500 mg Intravenous Q6H PRN Marshia Ly, PA-C      . ondansetron [facility]) tablet 4 mg  4 mg Oral Q6H PRN Marshia Ly, PA-C       Or  . ondansetron [facility]) injection 4 mg  4 mg Intravenous Q6H PRN Marshia Ly, PA-C      . oxyCODONE (Oxy IR/ROXICODONE) immediate release tablet 5-10 mg  5-10 mg Oral Q3H PRN Marshia Ly, PA-C   10 mg at 07/25/16 0825  . polyethylene glycol (MIRALAX / GLYCOLAX) packet 17 g  17 g Oral Daily PRN Marshia Ly, PA-C      . pravastatin (PRAVACHOL) tablet 40 mg  40 mg Oral Daily Marshia Ly, PA-C   40 mg at 07/25/16 1041  . zolpidem (AMBIEN) tablet 5 mg  5 mg Oral QHS PRN Marshia Ly, PA-C         Discharge Medications: Please see discharge summary for a list of discharge medications.  Relevant Imaging Results:  Relevant Lab Results:   Additional Information SSN: [id number]  Maree Krabbe, LCSW

## 2016-07-25 NOTE — Progress Notes (Signed)
  PATIENT ID: [name]  MRN: [id number]  DOB/AGE:  1940/12/17 / 76 y.o.  1 Day Post-Op Procedure(s) (LRB): RIGHT TOTAL HIP ARTHROPLASTY ANTERIOR APPROACH (Right)  Subjective: Pain is moderate, burning or stinging in quality.  No c/o chest pain or SOB.   +tol PO liquid and solid, no flatus or BM Sitting in chair    Objective: Vital signs in last 24 hours: Temp:  [97.6 F (36.4 C)-99.3 F (37.4 C)] 99.2 F (37.3 C) (04/14 0502) Pulse Rate:  [71-79] 72 (04/14 0502) Resp:  [18-20] 18 (04/14 0502) BP: (94-114)/(53-87) 104/53 (04/14 1006) SpO2:  [97 %-100 %] 97 % (04/14 0502) Weight:  [87.1 kg (192 lb)] 87.1 kg (192 lb) (04/13 1156)  Intake/Output from previous day: 04/13 0701 - 04/14 0700 In: 3140 [P.O.:240; I.V.:2400; IV Piggyback:500] Out: 3900 [Urine:3300; Blood:600] Intake/Output this shift: Total I/O In: 240 [P.O.:240] Out: -    Recent Labs  07/24/16 0614 07/25/16 0445  HGB 12.1 9.0*    Recent Labs  07/24/16 0614 07/25/16 0445  WBC 7.4 10.6*  RBC 4.16 3.10*  HCT 39.2 28.8*  PLT 303 215    Recent Labs  07/24/16 0614 07/25/16 0445  NA 139 139  K 4.1 4.8  CL 102 107  CO2 27 26  BUN 12 8  CREATININE 0.58 0.71  GLUCOSE 102* 171*  CALCIUM 10.6* 9.0    Recent Labs  07/24/16 0614  INR 1.06    Physical Exam: ABD soft Sensation intact distally Dorsiflexion/Plantar flexion intact Compartment soft dressing c/d/i externally  Assessment/Plan: 1 Day Post-Op Procedure(s) (LRB): RIGHT TOTAL HIP ARTHROPLASTY ANTERIOR APPROACH (Right)   Postop anemia--will check CBC in am   Advance diet Up with therapy Discharge to SNF--possibly tomorrow Weight Bearing as Tolerated (WBAT) VTE prophylaxis: intermittent pneumatic compression boots and ASA   Onalee Hua A. Janee Morn, MD      Orthopaedic & Hand Surgery [facility] [address] [place], [place]  [address] Office: [phone] Mobile: [phone]  07/25/2016, 10:50  AM

## 2016-07-25 NOTE — Clinical Social Work Note (Signed)
Clinical Social Work Assessment  Patient Details  Name: [name] MRN: [id number] Date of Birth: 1941-03-28  Date of referral:  07/25/16               Reason for consult:  Facility Placement                Permission sought to share information with:  Family Supports Permission granted to share information::     Name::        Agency::     Relationship::     Contact Information:     Housing/Transportation Living arrangements for the past 2 months:  Skilled Building surveyor of Information:  Patient, Adult Children Patient Interpreter Needed:  None Criminal Activity/Legal Involvement Pertinent to Current Situation/Hospitalization:  No - Comment as needed Significant Relationships:  Adult Children Lives with:  Adult Children Do you feel safe going back to the place where you live?    Need for family participation in patient care:  Yes (Comment)  Care giving concerns:  Pt's daughter present at bedside during initial assessment.   Social Worker assessment / plan:  CSW spoke with pt and pt's daughter at bedside to complete initial assessment. Pt did not speak. Pt only would nod her head. Pt appeared to be very sleepy. Pt's daughter states pt lives at home with her other daughter. Plan is for pt to go to [place] at d/c. Pt's daughter had pre set it up. CSW will follow up with facility and prepare transport at d/c. Pt's daughter states the MD is thinking a d/c on Monday.   Employment status:  Retired Database administrator PT Recommendations:  Skilled Nursing Facility Information / Referral to community resources:  Skilled Nursing Facility  Patient/Family's Response to care:  Pt's daughter verbalized understanding of CSW role and expressed appreciation for support. Pt denies any concern regarding pt care at this time.   Patient/Family's Understanding of and Emotional Response to Diagnosis, Current Treatment, and Prognosis:  Pt understanding and realistic  regarding physical limitations. Pt understands the need for SNF placement at d/c. Pt agreeable to SNF placement at d/c, at this time. Pt's responses emotionally appropriate during conversation with CSW. Pt denies any concern regarding treatment plan at this time. CSW will continue to provide support and facilitate d/c needs.   Emotional Assessment Appearance:  Appears stated age Attitude/Demeanor/Rapport:   (Patient was appropriate. Pt very sleepy in and out of conversation.) Affect (typically observed):  Accepting, Appropriate, Calm Orientation:  Oriented to Self, Oriented to Place, Oriented to Situation, Oriented to  Time Alcohol / Substance use:  Not Applicable Psych involvement (Current and /or in the community):  No (Comment)  Discharge Needs  Concerns to be addressed:  No discharge needs identified Readmission within the last 30 days:  No Current discharge risk:  Dependent with Mobility Barriers to Discharge:  Continued Medical Work up   [employer], LCSW 07/25/2016, 1:29 PM

## 2016-07-25 NOTE — Progress Notes (Signed)
Physical Therapy Treatment Patient Details Name: [name] MRN: [id number] DOB: 1940-05-09 Today's Date: 07/25/2016    History of Present Illness Pt is a 76 yo female admitted on 07/24/16 for elective right THA. PMH significant for L TKA 2011, GERD, HTN, HLD, B LE edema, obesity, anxiety.     PT Comments    Pt demonstrates improved tolerance for gait this session with the ability to ambulate short distances to door and back to bathroom. Improved transfers and decreased c/o dizziness with mobility this session. Pt does, however have increased groaning with mobility due to discomfort.     Follow Up Recommendations  SNF;Supervision/Assistance - 24 hour     Equipment Recommendations  None recommended by PT    Recommendations for Other Services OT consult     Precautions / Restrictions Precautions Precautions: Fall Restrictions Weight Bearing Restrictions: Yes RLE Weight Bearing: Weight bearing as tolerated    Mobility  Bed Mobility               General bed mobility comments: OOB in recliner when PT arrives  Transfers Overall transfer level: Needs assistance Equipment used: Rolling walker (2 wheeled) Transfers: Sit to/from Stand Sit to Stand: Mod assist;Min assist Stand pivot transfers: Min assist       General transfer comment: MOD A to scoot to edge of chair and MIn a to stand with cues for hand placement.   Ambulation/Gait Ambulation/Gait assistance: Min assist Ambulation Distance (Feet): 40 Feet Assistive device: Rolling walker (2 wheeled) Gait Pattern/deviations: Step-through pattern;Antalgic Gait velocity: decreased Gait velocity interpretation: Below normal speed for age/gender General Gait Details: moderate antalgic gait with cues for sequencing. Improved tolerance for increased distance this session.    Stairs            Wheelchair Mobility    Modified Rankin (Stroke Patients Only)       Balance Overall balance assessment: Needs  assistance Sitting-balance support: No upper extremity supported;Feet supported Sitting balance-Leahy Scale: Fair                                      Cognition Arousal/Alertness: Awake/alert Behavior During Therapy: WFL for tasks assessed/performed;Anxious Overall Cognitive Status: Within Functional Limits for tasks assessed                                        Exercises         Pertinent Vitals/Pain Pain Assessment: 0-10 Pain Score: 3  Pain Location: right hip (anterior thigh) with mobility Pain Descriptors / Indicators: Grimacing;Guarding;Discomfort Pain Intervention(s): Monitored during session    Home Living                      Prior Function            PT Goals (current goals can now be found in the care plan section) Acute Rehab PT Goals Patient Stated Goal: to go to rehab and then home Progress towards PT goals: Progressing toward goals    Frequency    7X/week      PT Plan Current plan remains appropriate    Co-evaluation             End of Session Equipment Utilized During Treatment: Gait belt Activity Tolerance: Patient tolerated treatment well Patient left: Other (comment);with nursing/sitter in  room (in bathroom with NT to get washed up) Nurse Communication: Mobility status PT Visit Diagnosis: Difficulty in walking, not elsewhere classified (R26.2);Pain Pain - Right/Left: Right Pain - part of body: Hip     Time: 1610-9604 PT Time Calculation (min) (ACUTE ONLY): 11 min  Charges:   $Therapeutic Activity: 8-22 mins                    G Codes:       Colin Broach PT, DPT  [phone]    Ruel Favors Aletha Halim 07/25/2016, 3:59 PM

## 2016-07-25 NOTE — Discharge Summary (Signed)
Physician Discharge Summary  Patient ID: [name] MRN: [id number] DOB/AGE: [birth year] 76 y.o.  Admit date: 07/24/2016 Discharge date: 07-26-16  Admission Diagnoses:  Primary osteoarthritis of right hip  Discharge Diagnoses:  Principal Problem:   Primary osteoarthritis of right hip Active Problems:   Morbid obesity - BMI 46   Past Medical History:  Diagnosis Date  . Anemia    on iron  . Anxiety   . Arthritis    shoulders, knees, hands, hips; status post left knee surgery  . Constipation   . Depression   . GERD (gastroesophageal reflux disease)    TAKES TUMS  . H/O: pneumonia 2003  . Heart murmur    ???   YEARS AGO  . Hyperlipidemia    on med  . Hypertension   . Morbid obesity with BMI of 45.0-49.9, adult (HCC)   . Pneumonia    15-80YRS    Surgeries: Procedure(s): RIGHT TOTAL HIP ARTHROPLASTY ANTERIOR APPROACH on 07/24/2016   Consultants (if any):   Discharged Condition: Improved  Hospital Course: [name] is an 76 y.o. female who was admitted 07/24/2016 with a diagnosis of Primary osteoarthritis of right hip and went to the operating room on 07/24/2016 and underwent the above named procedures.   At the time of discharge, she is medically stable, oriented and alert, tol PO with + flatus, bandage clean and dry externally, NVI.  She was given perioperative antibiotics:  Anti-infectives    Start     Dose/Rate Route Frequency Ordered Stop   07/24/16 2219  ceFAZolin (ANCEF) IVPB 2g/100 mL premix    Comments:  Longer duration due to her obesity and increased risk of wound infection   2 g 200 mL/hr over 30 Minutes Intravenous Every 6 hours 07/24/16 2219 07/25/16 0458   07/24/16 1330  ceFAZolin (ANCEF) IVPB 2g/100 mL premix  Status:  Discontinued    Comments:  Longer duration due to her obesity and increased risk of wound infection   2 g 200 mL/hr over 30 Minutes Intravenous Every 6 hours 07/24/16 1206 07/24/16 2219   07/24/16 0700  ceFAZolin (ANCEF) IVPB  2g/100 mL premix     2 g 200 mL/hr over 30 Minutes Intravenous To [facility] 07/23/16 1044 07/24/16 0737   07/24/16 0000  cephALEXin (KEFLEX) 500 MG capsule     500 mg Oral 3 times daily 07/24/16 1012      .  She was given sequential compression devices, early ambulation, and ASA for DVT prophylaxis.  She benefited maximally from the hospital stay and there were no complications.    Recent vital signs:  Vitals:   07/25/16 1000 07/25/16 1006  BP: 99/62 (!) 104/53  Pulse:    Resp:    Temp:      Recent laboratory studies:  Lab Results  Component Value Date   HGB 9.0 (L) 07/25/2016   HGB 12.1 07/24/2016   HGB 12.7 07/08/2016   Lab Results  Component Value Date   WBC 10.6 (H) 07/25/2016   PLT 215 07/25/2016   Lab Results  Component Value Date   INR 1.06 07/24/2016   Lab Results  Component Value Date   NA 139 07/25/2016   K 4.8 07/25/2016   CL 107 07/25/2016   CO2 26 07/25/2016   BUN 8 07/25/2016   CREATININE 0.71 07/25/2016   GLUCOSE 171 (H) 07/25/2016    Discharge Medications:   Allergies as of 07/25/2016      Reactions   Effexor [venlafaxine Hcl] Swelling  MD ADVISES PATIENT TO NOT TAKE IN FUTURE PT STOPPED, MD DC'd MED   Sulfa Drugs Cross Reactors Itching      Medication List    STOP taking these medications   HYDROcodone-acetaminophen 5-325 MG tablet Commonly known as:  NORCO/VICODIN     TAKE these medications   aspirin EC 325 MG tablet Take 1 tablet (325 mg total) by mouth 2 (two) times daily after a meal. Take x 1 month post op to decrease risk of blood clots. What changed:  medication strength  how much to take  when to take this  additional instructions   cephALEXin 500 MG capsule Commonly known as:  KEFLEX Take 1 capsule (500 mg total) by mouth 3 (three) times daily.   docusate sodium 100 MG capsule Commonly known as:  COLACE Take 1 capsule (100 mg total) by mouth 2 (two) times daily.   Iron 325 (65 Fe) MG Tabs Take 1  tablet by mouth every morning.   lisinopril 20 MG tablet Commonly known as:  PRINIVIL,ZESTRIL TAKE 1 TABLET BY MOUTH EVERY DAY   LORazepam 0.5 MG tablet Commonly known as:  ATIVAN Take 1 tablet (0.5 mg total) by mouth daily as needed for anxiety.   nitrofurantoin 100 MG capsule Commonly known as:  MACRODANTIN Take 100 mg by mouth 2 (two) times daily.   oxyCODONE-acetaminophen 5-325 MG tablet Commonly known as:  PERCOCET/ROXICET Take 1-2 tablets by mouth every 6 (six) hours as needed for severe pain.   pravastatin 40 MG tablet Commonly known as:  PRAVACHOL Take 1 tablet (40 mg total) by mouth 2 (two) times daily. What changed:  when to take this   tiZANidine 2 MG tablet Commonly known as:  ZANAFLEX Take 1 tablet (2 mg total) by mouth every 8 (eight) hours as needed for muscle spasms.       Diagnostic Studies: Dg Chest 2 View  Result Date: 07/08/2016 CLINICAL DATA:  Preoperative examination prior right hip joint replacement. History of hypertension, morbid obesity, former smoker. EXAM: CHEST  2 VIEW COMPARISON:  PA and lateral chest x-ray of January 11, 2012 FINDINGS: The lungs are mildly hyperinflated. There is no focal infiltrate. There is no pleural effusion. The heart and pulmonary vascularity are normal. There is calcification in the wall of the aortic arch. The patient has undergone previous left shoulder replacement. There is severe degenerative change of the right shoulder. The thoracic vertebral bodies are preserved in height where visualized. IMPRESSION: Chronic bronchitic changes.  No pneumonia nor CHF. Thoracic aortic atherosclerosis. Electronically Signed   By: Evelyne Makepeace  [place] M.D.   On: 07/08/2016 11:37   Dg C-arm 61-120 Min  Result Date: 07/24/2016 CLINICAL DATA:  Status post right hip arthroplasty. EXAM: DG C-ARM 61-120 MIN FLUOROSCOPY TIME:  33 seconds. COMPARISON:  None. FINDINGS: Four intraoperative fluoroscopic images of the right hip were obtained. These  demonstrate the patient be status post right total hip arthroplasty. The femoral and acetabular components appear to be well situated. IMPRESSION: Status post right total hip arthroplasty. Electronically Signed   By: Lupita Raider, M.D.   On: 07/24/2016 10:07   Dg Hip Operative Unilat W Or W/o Pelvis Right  Result Date: 07/24/2016 CLINICAL DATA:  Hip replacement EXAM: OPERATIVE RIGHT HIP (WITH PELVIS IF PERFORMED) 4 VIEWS TECHNIQUE: Fluoroscopic spot image(s) were submitted for interpretation post-operatively. COMPARISON:  None. FINDINGS: Changes of right hip replacement noted. Normal AP alignment. No hardware or bony complicating feature. IMPRESSION: Right hip replacement.  No visible complicating feature.  Electronically Signed   By: Charlett Nose M.D.   On: 07/24/2016 10:10    Disposition: SNF--[facility]    Follow-up Information    GRAVES,JOHN L, MD. Schedule an appointment as soon as possible for a visit in 2 week(s).   Specialty:  Orthopedic Surgery Contact information: [address] [place] [address] [phone]            Signed: Janee Morn, Denaisha Swango A. 07/25/2016, 10:56 AM

## 2016-07-25 NOTE — Clinical Social Work Placement (Signed)
   CLINICAL SOCIAL WORK PLACEMENT  NOTE  Date:  07/25/2016  Patient Details  Name: [name] MRN: [id number] Date of Birth: 01-27-1941  Clinical Social Work is seeking post-discharge placement for this patient at the Skilled  Nursing Facility level of care (*CSW will initial, date and re-position this form in  chart as items are completed):      Patient/family provided with [facility] Health Clinical Social Work Department's list of facilities offering this level of care within the geographic area requested by the patient (or if unable, by the patient's family).  Yes   Patient/family informed of their freedom to choose among providers that offer the needed level of care, that participate in Medicare, Medicaid or managed care program needed by the patient, have an available bed and are willing to accept the patient.      Patient/family informed of [place]'s ownership interest in [facility] and [facility], as well as of the fact that they are under no obligation to receive care at these facilities.  PASRR submitted to EDS on       PASRR number received on 07/25/16     Existing PASRR number confirmed on       FL2 transmitted to all facilities in geographic area requested by pt/family on 07/25/16     FL2 transmitted to all facilities within larger geographic area on 07/25/16     Patient informed that his/her managed care company has contracts with or will negotiate with certain facilities, including the following:        No   Patient/family informed of bed offers received.  Patient chooses bed at [facility]     Physician recommends and patient chooses bed at      Patient to be transferred to [facility] on 07/27/16.  Patient to be transferred to facility by [facility]     Patient family notified on   of transfer.  Name of family member notified:        PHYSICIAN Please prepare priority discharge summary, including medications, Please prepare prescriptions, Please  sign FL2     Additional Comment:    _______________________________________________ Maree Krabbe, LCSW 07/25/2016, 1:32 PM

## 2016-07-25 NOTE — Progress Notes (Addendum)
Physical Therapy Treatment Patient Details Name: [name] MRN: [id number] DOB: Sep 04, 1940 Today's Date: 07/25/2016    History of Present Illness Pt is a 76 yo female admitted on 07/24/16 for elective right THA. PMH significant for L TKA 2011, GERD, HTN, HLD, B LE edema, obesity, anxiety.     PT Comments    Pt is POD 1 and is moving slowly with therapy this session. Increased confusion noted this session due to medications. Pt is able to perform transfers with Mod A to stand and Min A to continue throughout transfer this session. Pt has less complaints of pain this session as compared to previous.     Follow Up Recommendations  SNF;Supervision/Assistance - 24 hour     Equipment Recommendations  None recommended by PT    Recommendations for Other Services OT consult     Precautions / Restrictions Precautions Precautions: Fall Restrictions Weight Bearing Restrictions: Yes RLE Weight Bearing: Weight bearing as tolerated    Mobility  Bed Mobility Overal bed mobility: Needs Assistance Bed Mobility: Supine to Sit     Supine to sit: Mod assist;HOB elevated     General bed mobility comments: Mod A with use of pads to bring LE's and bottom EOB. Pt does assist toward end and does decrease to Min A.   Transfers Overall transfer level: Needs assistance Equipment used: Rolling walker (2 wheeled) Transfers: Sit to/from [employer] Sit to Stand: Mod assist Stand pivot transfers: Min assist       General transfer comment: Mod A to stand from EOB initially and Min A to transfer to recliner with cues for sequencing and for hand placement when sitting in recliner  Ambulation/Gait                 Stairs            Wheelchair Mobility    Modified Rankin (Stroke Patients Only)       Balance                                            Cognition Arousal/Alertness: Awake/alert Behavior During Therapy: WFL for tasks  assessed/performed;Anxious Overall Cognitive Status: Within Functional Limits for tasks assessed                                        Exercises Total Joint Exercises Ankle Circles/Pumps: AROM;Both;20 reps;Supine Quad Sets: AROM;Right;10 reps;Supine    General Comments General comments (skin integrity, edema, etc.): BP 99/63 once sitting with symptoms of dizziness throughout transfer. improved to 104/53 after 1 minue rest.       Pertinent Vitals/Pain Pain Assessment: 0-10 Pain Score: 3  Pain Location: right hip (anterior thigh) with mobility Pain Descriptors / Indicators: Grimacing;Guarding;Discomfort Pain Intervention(s): Monitored during session;Premedicated before session;Ice applied    Home Living                      Prior Function            PT Goals (current goals can now be found in the care plan section) Acute Rehab PT Goals Patient Stated Goal: to go to rehab and then home Progress towards PT goals: Progressing toward goals    Frequency    7X/week      PT  Plan Current plan remains appropriate    Co-evaluation             End of Session Equipment Utilized During Treatment: Gait belt Activity Tolerance: Patient limited by fatigue Patient left: in chair;with call bell/phone within reach;with family/visitor present Nurse Communication: Mobility status PT Visit Diagnosis: Difficulty in walking, not elsewhere classified (R26.2);Pain Pain - Right/Left: Right Pain - part of body: Hip     Time: 1610-9604 PT Time Calculation (min) (ACUTE ONLY): 29 min  Charges:  $Therapeutic Exercise: 8-22 mins $Therapeutic Activity: 8-22 mins                    G Codes:       Colin Broach PT, DPT  [phone]    [name] 07/25/2016, 1:35 PM

## 2016-07-26 DIAGNOSIS — D500 Iron deficiency anemia secondary to blood loss (chronic): Secondary | ICD-10-CM | POA: Diagnosis not present

## 2016-07-26 DIAGNOSIS — R2681 Unsteadiness on feet: Secondary | ICD-10-CM | POA: Diagnosis not present

## 2016-07-26 DIAGNOSIS — D509 Iron deficiency anemia, unspecified: Secondary | ICD-10-CM | POA: Diagnosis not present

## 2016-07-26 DIAGNOSIS — D72829 Elevated white blood cell count, unspecified: Secondary | ICD-10-CM | POA: Diagnosis not present

## 2016-07-26 DIAGNOSIS — Z96641 Presence of right artificial hip joint: Secondary | ICD-10-CM | POA: Diagnosis not present

## 2016-07-26 DIAGNOSIS — K5900 Constipation, unspecified: Secondary | ICD-10-CM | POA: Diagnosis not present

## 2016-07-26 DIAGNOSIS — N3000 Acute cystitis without hematuria: Secondary | ICD-10-CM | POA: Diagnosis not present

## 2016-07-26 DIAGNOSIS — I10 Essential (primary) hypertension: Secondary | ICD-10-CM | POA: Diagnosis not present

## 2016-07-26 DIAGNOSIS — E785 Hyperlipidemia, unspecified: Secondary | ICD-10-CM | POA: Diagnosis not present

## 2016-07-26 DIAGNOSIS — M6281 Muscle weakness (generalized): Secondary | ICD-10-CM | POA: Diagnosis not present

## 2016-07-26 DIAGNOSIS — F411 Generalized anxiety disorder: Secondary | ICD-10-CM | POA: Diagnosis not present

## 2016-07-26 DIAGNOSIS — D649 Anemia, unspecified: Secondary | ICD-10-CM | POA: Diagnosis not present

## 2016-07-26 DIAGNOSIS — K5901 Slow transit constipation: Secondary | ICD-10-CM | POA: Diagnosis not present

## 2016-07-26 DIAGNOSIS — M1990 Unspecified osteoarthritis, unspecified site: Secondary | ICD-10-CM | POA: Diagnosis not present

## 2016-07-26 DIAGNOSIS — Z5189 Encounter for other specified aftercare: Secondary | ICD-10-CM | POA: Diagnosis not present

## 2016-07-26 DIAGNOSIS — M1611 Unilateral primary osteoarthritis, right hip: Secondary | ICD-10-CM | POA: Diagnosis not present

## 2016-07-26 DIAGNOSIS — Z96643 Presence of artificial hip joint, bilateral: Secondary | ICD-10-CM | POA: Diagnosis not present

## 2016-07-26 LAB — CBC
HCT: 28 % — ABNORMAL LOW (ref 36.0–46.0)
HEMOGLOBIN: 8.7 g/dL — AB (ref 12.0–15.0)
MCH: 29 pg (ref 26.0–34.0)
MCHC: 31.1 g/dL (ref 30.0–36.0)
MCV: 93.3 fL (ref 78.0–100.0)
PLATELETS: 240 10*3/uL (ref 150–400)
RBC: 3 MIL/uL — ABNORMAL LOW (ref 3.87–5.11)
RDW: 15.9 % — ABNORMAL HIGH (ref 11.5–15.5)
WBC: 14.6 10*3/uL — AB (ref 4.0–10.5)

## 2016-07-26 NOTE — Progress Notes (Signed)
Discharge instructions provided to patient. [facility] received prescriptions and other paperwork. IV removed prior to discharge. Patients daughter is aware of transfer, she also concerned with patient's LOS. Daughter requests 30-40 days of SNF, advised to talk to SNF.

## 2016-07-26 NOTE — Clinical Social Work Placement (Signed)
   CLINICAL SOCIAL WORK PLACEMENT  NOTE  Date:  07/26/2016  Patient Details  Name: [name] MRN: [id number] Date of Birth: Dec 28, 1940  Clinical Social Work is seeking post-discharge placement for this patient at the Skilled  Nursing Facility level of care (*CSW will initial, date and re-position this form in  chart as items are completed):      Patient/family provided with [facility] Health Clinical Social Work Department's list of facilities offering this level of care within the geographic area requested by the patient (or if unable, by the patient's family).  Yes   Patient/family informed of their freedom to choose among providers that offer the needed level of care, that participate in Medicare, Medicaid or managed care program needed by the patient, have an available bed and are willing to accept the patient.      Patient/family informed of [facility]'s ownership interest in [facility] and [facility], as well as of the fact that they are under no obligation to receive care at these facilities.  PASRR submitted to EDS on       PASRR number received on 07/25/16     Existing PASRR number confirmed on       FL2 transmitted to all facilities in geographic area requested by pt/family on 07/25/16     FL2 transmitted to all facilities within larger geographic area on 07/25/16     Patient informed that his/her managed care company has contracts with or will negotiate with certain facilities, including the following:        No   Patient/family informed of bed offers received.  Patient chooses bed at [facility]     Physician recommends and patient chooses bed at      Patient to be transferred to [facility] on 07/26/16.  Patient to be transferred to facility by [facility]     Patient family notified on 07/26/16 of transfer.  Name of family member notified:        PHYSICIAN Please prepare priority discharge summary, including medications, Please prepare prescriptions,  Please sign FL2     Additional Comment:    _______________________________________________ Norlene Duel, LCSWA 07/26/2016, 11:58 AM

## 2016-07-26 NOTE — Clinical Social Work Note (Signed)
Clinical Social Worker facilitated patient discharge including contacting patient family and facility to confirm patient discharge plans.  Clinical information faxed to facility and family agreeable with plan.  CSW arranged ambulance transport via [facility] to [facility].  RN to call report prior to discharge.  Clinical Social Worker will sign off for now as social work intervention is no longer needed. Please consult us again if new need arises.  Jesse Scinto, LCSW [phone] 

## 2016-07-26 NOTE — Progress Notes (Signed)
Physical Therapy Treatment Patient Details Name: [name] MRN: [id number] DOB: [birth year] Today's Date: 07/26/2016    History of Present Illness Pt is a 76 yo female admitted on 07/24/16 for elective right THA. PMH significant for L TKA 2011, GERD, HTN, HLD, B LE edema, obesity, anxiety.     PT Comments    Pt is POD 2 and continues to be moving well with therapy. Pt is able to improve with transfers and short distance gait with decreased c/o discomfort. Improved ability to position herself in the recliner was noted this session. Pt continues to require SNF for short term rehab at discharge.     Follow Up Recommendations  SNF;Supervision/Assistance - 24 hour     Equipment Recommendations  None recommended by PT    Recommendations for Other Services       Precautions / Restrictions Precautions Precautions: Fall Restrictions Weight Bearing Restrictions: Yes RLE Weight Bearing: Weight bearing as tolerated    Mobility  Bed Mobility Overal bed mobility: Needs Assistance Bed Mobility: Supine to Sit     Supine to sit: Min assist;HOB elevated     General bed mobility comments: Min A to bring LE's EOB and to scoot bottom EOB  Transfers Overall transfer level: Needs assistance Equipment used: Rolling walker (2 wheeled) Transfers: Sit to/from Stand Sit to Stand: Min assist         General transfer comment: Min A from EOB   Ambulation/Gait Ambulation/Gait assistance: Min assist;Min guard Ambulation Distance (Feet): 40 Feet (+40) Assistive device: Rolling walker (2 wheeled) Gait Pattern/deviations: Step-through pattern;Antalgic Gait velocity: decreased Gait velocity interpretation: Below normal speed for age/gender General Gait Details: moderate antalgic gait with cues for sequencing. Improved tolerance for increased distance this session.    Stairs            Wheelchair Mobility    Modified Rankin (Stroke Patients Only)       Balance Overall  balance assessment: Needs assistance Sitting-balance support: No upper extremity supported;Feet supported Sitting balance-Leahy Scale: Fair     Standing balance support: Bilateral upper extremity supported;During functional activity Standing balance-Leahy Scale: Poor Standing balance comment: relies on Rw for stability with movement                            Cognition Arousal/Alertness: Awake/alert Behavior During Therapy: WFL for tasks assessed/performed;Anxious Overall Cognitive Status: Within Functional Limits for tasks assessed                                        Exercises      General Comments        Pertinent Vitals/Pain Pain Assessment: 0-10 Pain Score: 3  Pain Location: right hip (anterior thigh) with mobility Pain Descriptors / Indicators: Grimacing;Guarding;Discomfort Pain Intervention(s): Monitored during session;Premedicated before session;Ice applied    Home Living                      Prior Function            PT Goals (current goals can now be found in the care plan section) Acute Rehab PT Goals Patient Stated Goal: to go to rehab and then home Progress towards PT goals: Progressing toward goals    Frequency    7X/week      PT Plan Current plan remains appropriate    Co-evaluation  End of Session Equipment Utilized During Treatment: Gait belt Activity Tolerance: Patient tolerated treatment well Patient left: in chair;with call bell/phone within reach Nurse Communication: Mobility status PT Visit Diagnosis: Difficulty in walking, not elsewhere classified (R26.2);Pain Pain - Right/Left: Right Pain - part of body: Hip     Time: 4098-1191 PT Time Calculation (min) (ACUTE ONLY): 27 min  Charges:  $Gait Training: 8-22 mins $Therapeutic Activity: 8-22 mins                    G Codes:       Colin Broach PT, DPT  [phone]    Ruel Favors Aletha Halim 07/26/2016, 10:29 AM

## 2016-07-27 ENCOUNTER — Encounter (HOSPITAL_COMMUNITY): Payer: Self-pay | Admitting: Orthopedic Surgery

## 2016-07-28 ENCOUNTER — Non-Acute Institutional Stay (SKILLED_NURSING_FACILITY): Payer: MEDICARE | Admitting: Internal Medicine

## 2016-07-28 ENCOUNTER — Encounter: Payer: Self-pay | Admitting: Internal Medicine

## 2016-07-28 DIAGNOSIS — N3000 Acute cystitis without hematuria: Secondary | ICD-10-CM | POA: Diagnosis not present

## 2016-07-28 DIAGNOSIS — K5900 Constipation, unspecified: Secondary | ICD-10-CM

## 2016-07-28 DIAGNOSIS — D500 Iron deficiency anemia secondary to blood loss (chronic): Secondary | ICD-10-CM

## 2016-07-28 DIAGNOSIS — D72829 Elevated white blood cell count, unspecified: Secondary | ICD-10-CM | POA: Diagnosis not present

## 2016-07-28 DIAGNOSIS — F411 Generalized anxiety disorder: Secondary | ICD-10-CM | POA: Diagnosis not present

## 2016-07-28 DIAGNOSIS — M1611 Unilateral primary osteoarthritis, right hip: Secondary | ICD-10-CM | POA: Diagnosis not present

## 2016-07-28 DIAGNOSIS — I10 Essential (primary) hypertension: Secondary | ICD-10-CM

## 2016-07-28 DIAGNOSIS — R2681 Unsteadiness on feet: Secondary | ICD-10-CM

## 2016-07-28 NOTE — Progress Notes (Signed)
LOCATION: [facility]  PCP: Nilda Simmer, MD   Code Status: Full Code  Goals of care: Advanced Directive information Advanced Directives 07/24/2016  Does Patient Have a Medical Advance Directive? Yes  Type of Estate agent of [place];Living will  Does patient want to make changes to medical advance directive? No - Patient declined  Copy of Healthcare Power of Attorney in Chart? No - copy requested  Would patient like information on creating a medical advance directive? -  Pre-existing out of facility DNR order (yellow form or pink MOST form) -       Extended Emergency Contact Information Primary Emergency Contact: Rayborn,Angelita Address: [address]          [place], [place] [address] [place] Home Phone: [phone] Work Phone: [phone] Mobile Phone: [phone] Relation: Daughter Secondary Emergency Contact: Shella [place] Home Phone: [phone] Mobile Phone: [phone] Relation: Son   Allergies  Allergen Reactions  . Effexor [Venlafaxine Hcl] Swelling    MD ADVISES PATIENT TO NOT TAKE IN FUTURE PT STOPPED, MD DC'd MED  . Sulfa Drugs Cross Reactors Itching    Chief Complaint  Patient presents with  . New Admit To SNF    New Admission Visit      HPI:  Patient is a 76 y.o. female seen today for short term rehabilitation post hospital admission from 07/24/16-07/26/16 with primary right hip OA. She underwent right hip arthroplasty on 07/24/16. She was diagnosed with UTI and started on antibiotics. She is seen in her room today.   Review of Systems:  Constitutional: Negative for fever, chills, diaphoresis. Energy level is slowly coming back.  HENT: Negative for headache, congestion, nasal discharge, sore throat, difficulty swallowing.   Eyes: Negative for eye pain, blurred vision, double vision and discharge.  Respiratory: Negative for cough, shortness of breath and wheezing.     Cardiovascular: Negative for chest pain, palpitations, leg swelling.  Gastrointestinal: Negative for nausea, vomiting, abdominal pain, loss of appetite, melena. Last bowel movement was yesterday after 3 days with regular stool.  Genitourinary: Negative for dysuria and flank pain.  Musculoskeletal: Negative for back pain, fall in the facility. pain medication has been helping with right hip and leg pain.  Skin: Negative for itching, rash.  Neurological: Positive for occasional dizziness with change of position. Psychiatric/Behavioral: Negative for depression.   Past Medical History:  Diagnosis Date  . Anemia    on iron  . Anxiety   . Arthritis    shoulders, knees, hands, hips; status post left knee surgery  . Constipation   . Depression   . GERD (gastroesophageal reflux disease)    TAKES TUMS  . H/O: pneumonia 2003  . Heart murmur    ???   YEARS AGO  . Hyperlipidemia    on med  . Hypertension   . Morbid obesity with BMI of 45.0-49.9, adult (HCC)   . Pneumonia    15-85YRS   Past Surgical History:  Procedure Laterality Date  . CARDIAC CATHETERIZATION  04/20/1996   patent coronaries, EF 74% (Dr. Aram Candela)  . CARPAL TUNNEL RELEASE Left   . CATARACT EXTRACTION, BILATERAL    . COLONOSCOPY    . KNEE SURGERY Left 11/2001  . REPLACEMENT TOTAL KNEE Left 10/2009  . REPLACEMENT TOTAL KNEE Left 10/2009  . SHOULDER OPEN ROTATOR CUFF REPAIR  03/01/2012   Procedure: ROTATOR CUFF REPAIR SHOULDER OPEN;  Surgeon: Mable Paris, MD;  [facility]: [facility];  Service:  Orthopedics;  Laterality: Left;  SUBSCAPULARIS REPAIR  . STRESS TEST - ADENOSINE THALLIUM  1997   apical and inferior segments with questionable infarct and ischemia in anteroseptal & inferoseptal segments (Dr. Standley Brooking)  . SVD     x 7  . TOTAL HIP ARTHROPLASTY Right 07/24/2016  . TOTAL HIP ARTHROPLASTY Right 07/24/2016   Procedure: RIGHT TOTAL HIP ARTHROPLASTY ANTERIOR APPROACH;  Surgeon: [name], MD;  Location: [facility] OR;   Service: Orthopedics;  Laterality: Right;  . TOTAL SHOULDER ARTHROPLASTY  01/18/2012   Procedure: TOTAL SHOULDER ARTHROPLASTY;  Surgeon: Mable Paris, MD;  Location: [facility] OR;  Service: Orthopedics;  Laterality: Left;  . TOTAL SHOULDER REPLACEMENT Left 01/19/2012  . TRANSTHORACIC ECHOCARDIOGRAM  06/2011   LV mildly dilated, EF=>55%; mild mitral annular calcif, mild MR; mild TR; AV mildly sclerotic, mild aortic regurg  . TUBAL LIGATION    . WISDOM TOOTH EXTRACTION     Social History:   reports that she quit smoking about 32 years ago. Her smoking use included Cigarettes. She has a 25.00 pack-year smoking history. She has quit using smokeless tobacco. She reports that she does not drink alcohol or use drugs.  Family History  Problem Relation Age of Onset  . Heart disease Mother   . Heart disease Father   . Heart disease Sister   . Hypertension Sister   . Heart disease Sister   . Hypertension Sister   . Heart disease Sister   . Hypertension Sister     Medications: Allergies as of 07/28/2016      Reactions   Effexor [venlafaxine Hcl] Swelling   MD ADVISES PATIENT TO NOT TAKE IN FUTURE PT STOPPED, MD DC'd MED   Sulfa Drugs Cross Reactors Itching      Medication List       Accurate as of 07/28/16 11:13 AM. Always use your most recent med list.          aspirin EC 325 MG tablet Take 1 tablet (325 mg total) by mouth 2 (two) times daily after a meal. Take x 1 month post op to decrease risk of blood clots.   cephALEXin 500 MG capsule Commonly known as:  KEFLEX Take 1 capsule (500 mg total) by mouth 3 (three) times daily.   docusate sodium 100 MG capsule Commonly known as:  COLACE Take 1 capsule (100 mg total) by mouth 2 (two) times daily.   Iron 325 (65 Fe) MG Tabs Take 1 tablet by mouth every morning.   lisinopril 20 MG tablet Commonly known as:  PRINIVIL,ZESTRIL TAKE 1 TABLET BY MOUTH EVERY DAY   LORazepam 0.5 MG tablet Commonly known as:  ATIVAN Take 1  tablet (0.5 mg total) by mouth daily as needed for anxiety.   magnesium hydroxide 400 MG/5ML suspension Commonly known as:  MILK OF MAGNESIA Take 45 mLs by mouth daily as needed for mild constipation. Stop date 07/29/16   nitrofurantoin 100 MG capsule Commonly known as:  MACRODANTIN Take 100 mg by mouth 2 (two) times daily.   oxyCODONE-acetaminophen 5-325 MG tablet Commonly known as:  PERCOCET/ROXICET Take 1-2 tablets by mouth every 6 (six) hours as needed for severe pain.   pravastatin 40 MG tablet Commonly known as:  PRAVACHOL Take 1 tablet (40 mg total) by mouth 2 (two) times daily.   tiZANidine 2 MG tablet Commonly known as:  ZANAFLEX Take 1 tablet (2 mg total) by mouth every 8 (eight) hours as needed for muscle spasms.  Immunizations: Immunization History  Administered Date(s) Administered  . Influenza,inj,Quad PF,36+ Mos 12/06/2014, 07/10/2016  . PPD Test 07/26/2016  . Pneumococcal Conjugate-13 12/06/2014  . Pneumococcal Polysaccharide-23 01/18/2009  . Td 08/28/2015     Physical Exam: Vitals:   07/28/16 1104  BP: 134/88  Pulse: 91  Resp: 18  Temp: 97.5 F (36.4 C)  TempSrc: Oral  SpO2: 99%  Weight: 199 lb (90.3 kg)  Height:  (1.626 m)   Body mass index is 34.16 kg/m.  General- elderly female, obese, in no acute distress Head- normocephalic, atraumatic Nose- no nasal discharge Throat- moist mucus membrane Eyes- PERRLA, EOMI, no pallor, no icterus, no discharge, normal conjunctiva, normal sclera Neck- no cervical lymphadenopathy Cardiovascular- normal s1,s2, no murmur Respiratory- bilateral clear to auscultation, no wheeze, no rhonchi, no crackles, no use of accessory muscles Abdomen- bowel sounds present, soft, non tender, no guarding or rigidity Musculoskeletal- able to move all 4 extremities, limited right hip range of motion, trace leg edema Neurological- alert and oriented to person, place and time Skin- warm and dry, right hip surgical  incision with aquacel dressing Psychiatry- normal mood and affect    Labs reviewed: Basic Metabolic Panel:  Recent Labs  16/10/96 1048 07/24/16 0614 07/25/16 0445  NA 140 139 139  K 4.1 4.1 4.8  CL 105 102 107  CO2 GLUCOSE 94 102* 171*  BUN CREATININE 0.54 0.58 0.71  CALCIUM 10.3 10.6* 9.0   Liver Function Tests:  Recent Labs  10/22/15 1235 07/08/16 1048 07/24/16 0614  AST ALT ALKPHOS 63 65 67  BILITOT 0.5 0.9 0.9  PROT 7.2 7.7 7.5  ALBUMIN 4.0 4.1 4.1   No results for input(s): LIPASE, AMYLASE in the last 8760 hours. No results for input(s): AMMONIA in the last 8760 hours. CBC:  Recent Labs  10/22/15 1235 07/08/16 1048 07/24/16 0614 07/25/16 0445 07/26/16 0258  WBC 8.1 7.2 7.4 10.6* 14.6*  NEUTROABS 4,536 3.8 5.0  --   --   HGB 12.2 12.7 12.1 9.0* 8.7*  HCT 37.8 40.0 39.2 28.8* 28.0*  MCV 91.5 93.0 94.2 92.9 93.3  PLT 329 293 303 215 240   Cardiac Enzymes: No results for input(s): CKTOTAL, CKMB, CKMBINDEX, TROPONINI in the last 8760 hours. BNP: Invalid input(s): POCBNP CBG: No results for input(s): GLUCAP in the last 8760 hours.  Radiological Exams: Dg Chest 2 View  Result Date: 07/08/2016 CLINICAL DATA:  Preoperative examination prior right hip joint replacement. History of hypertension, morbid obesity, former smoker. EXAM: CHEST  2 VIEW COMPARISON:  PA and lateral chest x-ray of January 11, 2012 FINDINGS: The lungs are mildly hyperinflated. There is no focal infiltrate. There is no pleural effusion. The heart and pulmonary vascularity are normal. There is calcification in the wall of the aortic arch. The patient has undergone previous left shoulder replacement. There is severe degenerative change of the right shoulder. The thoracic vertebral bodies are preserved in height where visualized. IMPRESSION: Chronic bronchitic changes.  No pneumonia nor CHF. Thoracic aortic atherosclerosis. Electronically Signed    By: David  [place] M.D.   On: 07/08/2016 11:37   Dg C-arm 61-120 Min  Result Date: 07/24/2016 CLINICAL DATA:  Status post right hip arthroplasty. EXAM: DG C-ARM 61-120 MIN FLUOROSCOPY TIME:  33 seconds. COMPARISON:  None. FINDINGS: Four intraoperative fluoroscopic images of the right hip were obtained. These demonstrate the patient be status post right total hip arthroplasty. The  femoral and acetabular components appear to be well situated. IMPRESSION: Status post right total hip arthroplasty. Electronically Signed   By: Lupita Raider, M.D.   On: 07/24/2016 10:07   Dg Hip Operative Unilat W Or W/o Pelvis Right  Result Date: 07/24/2016 CLINICAL DATA:  Hip replacement EXAM: OPERATIVE RIGHT HIP (WITH PELVIS IF PERFORMED) 4 VIEWS TECHNIQUE: Fluoroscopic spot image(s) were submitted for interpretation post-operatively. COMPARISON:  None. FINDINGS: Changes of right hip replacement noted. Normal AP alignment. No hardware or bony complicating feature. IMPRESSION: Right hip replacement.  No visible complicating feature. Electronically Signed   By: Charlett Nose M.D.   On: 07/24/2016 10:10    Assessment/Plan  Unsteady gait Will have patient work with PT/OT as tolerated to regain strength and restore function.  Fall precautions are in place.  Right hip OA s/p arthroplasty. Has f/u with orthopedic. Continue percocet 5-325 mg 1-2 tab q6h prn pain and zanaflex 2 mg q8h prn muscle spasm. Continue aspirin 325 mg bid x 1 month for dvt prophylaxis. WBAT to RLE. Will have her work with physical therapy and occupational therapy team to help with gait training and muscle strengthening exercises.fall precautions. Skin care. Encourage to be out of bed.   UTI Currently on keflex 500 mg tid from 07/24/16 for 5 days. Complete course on 07/29/16. Maintain hydration and perineal hygiene. D/c nitrofurantoin  Blood loss anemia Post op, monitor cbc. Continue iron supplement  Leukocytosis Afebrile. Currently on antibiotic  for UTI. Monitor clinically.   Anxiety Stable mood. Continue lorazepam 0.5 mg qd prn  HTN Monitor BP, continue lisinopril  Constipation Currently on colace 100 mg bid, maintain hydration, monitor    Goals of care: short term rehabilitation   Labs/tests ordered: cbc 07/30/16  Family/ staff Communication: reviewed care plan with patient and nursing supervisor    Oneal Grout, MD Internal Medicine [facility] [address] [place], [place] [address] Cell Phone (Monday-Friday 8 am - 5 pm): [phone] On Call: [phone] and follow prompts after 5 pm and on weekends Office Phone: [phone] Office Fax: [phone]

## 2016-07-30 LAB — CBC AND DIFFERENTIAL
HEMATOCRIT: 30 % — AB (ref 36–46)
Hemoglobin: 9.5 g/dL — AB (ref 12.0–16.0)
PLATELETS: 296 10*3/uL (ref 150–399)
WBC: 8.4 10*3/mL

## 2016-08-05 ENCOUNTER — Non-Acute Institutional Stay (SKILLED_NURSING_FACILITY): Payer: MEDICARE | Admitting: Family

## 2016-08-05 ENCOUNTER — Encounter: Payer: Self-pay | Admitting: Family

## 2016-08-05 DIAGNOSIS — K5901 Slow transit constipation: Secondary | ICD-10-CM

## 2016-08-05 DIAGNOSIS — D509 Iron deficiency anemia, unspecified: Secondary | ICD-10-CM

## 2016-08-05 DIAGNOSIS — I10 Essential (primary) hypertension: Secondary | ICD-10-CM | POA: Diagnosis not present

## 2016-08-05 DIAGNOSIS — R2681 Unsteadiness on feet: Secondary | ICD-10-CM

## 2016-08-05 DIAGNOSIS — F411 Generalized anxiety disorder: Secondary | ICD-10-CM | POA: Diagnosis not present

## 2016-08-05 DIAGNOSIS — E785 Hyperlipidemia, unspecified: Secondary | ICD-10-CM

## 2016-08-05 NOTE — Progress Notes (Deleted)
Location:  [facility] Room Number: 1202 Place of Service:  SNF (31)  Provider: Richarda Blade, NP  PCP: Nilda Simmer, MD Patient Care Team: Ethelda Chick, MD as PCP - General (Family Medicine) [name], MD as Consulting Physician (Orthopedic Surgery)  Extended Emergency Contact Information Primary Emergency Contact: Shimmin,Angelita Address: [address]          [place], [place] [address] [place] Home Phone: [phone] Work Phone: [phone] Mobile Phone: [phone] Relation: Daughter Secondary Emergency Contact: Shella [place] Home Phone: [phone] Mobile Phone: [phone] Relation: Son  Code Status: Full Code  Goals of care:  Advanced Directive information Advanced Directives 08/05/2016  Does Patient Have a Medical Advance Directive? No  Type of Advance Directive -  Does patient want to make changes to medical advance directive? -  Copy of Healthcare Power of Attorney in Chart? -  Would patient like information on creating a medical advance directive? -  Pre-existing out of facility DNR order (yellow form or pink MOST form) -     Allergies  Allergen Reactions  . Effexor [Venlafaxine Hcl] Swelling    MD ADVISES PATIENT TO NOT TAKE IN FUTURE PT STOPPED, MD DC'd MED  . Sulfa Drugs Cross Reactors Itching    Chief Complaint  Patient presents with  . Discharge Note    Resident is being seen for discharge from SNF.     HPI:  76 y.o. female      Past Medical History:  Diagnosis Date  . Anemia    on iron  . Anxiety   . Arthritis    shoulders, knees, hands, hips; status post left knee surgery  . Constipation   . Depression   . GERD (gastroesophageal reflux disease)    TAKES TUMS  . H/O: pneumonia 2003  . Heart murmur    ???   YEARS AGO  . Hyperlipidemia    on med  . Hypertension   . Morbid obesity with BMI of 45.0-49.9, adult (HCC)   . Pneumonia    15-43YRS     Past Surgical History:  Procedure Laterality Date  . CARDIAC CATHETERIZATION  04/20/1996   patent coronaries, EF 74% (Dr. Aram Candela)  . CARPAL TUNNEL RELEASE Left   . CATARACT EXTRACTION, BILATERAL    . COLONOSCOPY    . KNEE SURGERY Left 11/2001  . REPLACEMENT TOTAL KNEE Left 10/2009  . REPLACEMENT TOTAL KNEE Left 10/2009  . SHOULDER OPEN ROTATOR CUFF REPAIR  03/01/2012   Procedure: ROTATOR CUFF REPAIR SHOULDER OPEN;  Surgeon: Mable Paris, MD;  Location[facility] OR;  Service: Orthopedics;  Laterality: Left;  SUBSCAPULARIS REPAIR  . STRESS TEST - ADENOSINE THALLIUM  1997   apical and inferior segments with questionable infarct and ischemia in anteroseptal & inferoseptal segments (Dr. Standley Brooking)  . SVD     x 7  . TOTAL HIP ARTHROPLASTY Right 07/24/2016  . TOTAL HIP ARTHROPLASTY Right 07/24/2016   Procedure: RIGHT TOTAL HIP ARTHROPLASTY ANTERIOR APPROACH;  Surgeon: [name], MD;  Location: [facility] OR;  Service: Orthopedics;  Laterality: Right;  . TOTAL SHOULDER ARTHROPLASTY  01/18/2012   Procedure: TOTAL SHOULDER ARTHROPLASTY;  Surgeon: Mable Paris, MD;  Location[facility] OR;  Service: Orthopedics;  Laterality: Left;  . TOTAL SHOULDER REPLACEMENT Left 01/19/2012  . TRANSTHORACIC ECHOCARDIOGRAM  06/2011   LV mildly dilated, EF=>55%; mild mitral annular calcif, mild MR; mild TR; AV mildly sclerotic, mild aortic regurg  . TUBAL  LIGATION    . WISDOM TOOTH EXTRACTION        reports that she quit smoking about 32 years ago. Her smoking use included Cigarettes. She has a 25.00 pack-year smoking history. She has quit using smokeless tobacco. She reports that she does not drink alcohol or use drugs. Social History   Social History  . Marital status: Widowed    Spouse name: N/A  . Number of children: 7  . Years of education: N/A   Occupational History  . retired Retired   Social History Main Topics  . Smoking status: Former Smoker    Packs/day: 1.00    Years: 25.00     Types: Cigarettes    Quit date: 03/07/1984  . Smokeless tobacco: Former Neurosurgeon  . Alcohol use No  . Drug use: No  . Sexual activity: No   Other Topics Concern  . Not on file   Social History Narrative   Marital status:   She is a widowed since 2009; married 45 years      Children:   mother of 54, grandmother of 5, great-grandmother of 6.      Lives: alone with home; daughter, son lives with patient      Tobacco: quit smoking 34 years ago      Alcohol: none      ADLs:  Walks with cane; drives.    She does not drink alcohol or smoke cigarettes.   She previously tried exercise routinely but is not exercising as much lately. Her excuse is do to knee pain and back pain as well as cold weather.   Functional Status Survey:    Allergies  Allergen Reactions  . Effexor [Venlafaxine Hcl] Swelling    MD ADVISES PATIENT TO NOT TAKE IN FUTURE PT STOPPED, MD DC'd MED  . Sulfa Drugs Cross Reactors Itching    Pertinent  Health Maintenance Due  Topic Date Due  . COLONOSCOPY  09/02/1992  . DEXA SCAN  09/03/2007  . INFLUENZA VACCINE  11/11/2016  . MAMMOGRAM  12/16/2017  . PNA vac Low Risk Adult  Completed    Medications: Allergies as of 08/05/2016      Reactions   Effexor [venlafaxine Hcl] Swelling   MD ADVISES PATIENT TO NOT TAKE IN FUTURE PT STOPPED, MD DC'd MED   Sulfa Drugs Cross Reactors Itching      Medication List       Accurate as of 08/05/16  9:06 AM. Always use your most recent med list.          aspirin EC 325 MG tablet Take 1 tablet (325 mg total) by mouth 2 (two) times daily after a meal. Take x 1 month post op to decrease risk of blood clots.   docusate sodium 100 MG capsule Commonly known as:  COLACE Take 1 capsule (100 mg total) by mouth 2 (two) times daily.   Iron 325 (65 Fe) MG Tabs Take 1 tablet by mouth every morning.   lisinopril 20 MG tablet Commonly known as:  PRINIVIL,ZESTRIL TAKE 1 TABLET BY MOUTH EVERY DAY   LORazepam 0.5 MG tablet Commonly  known as:  ATIVAN Take 1 tablet (0.5 mg total) by mouth daily as needed for anxiety.   oxyCODONE-acetaminophen 5-325 MG tablet Commonly known as:  PERCOCET/ROXICET Take 1-2 tablets by mouth every 6 (six) hours as needed for severe pain.   pravastatin 40 MG tablet Commonly known as:  PRAVACHOL Take 1 tablet (40 mg total) by mouth 2 (two) times daily.  tiZANidine 2 MG tablet Commonly known as:  ZANAFLEX Take 1 tablet (2 mg total) by mouth every 8 (eight) hours as needed for muscle spasms.       Review of Systems  Vitals:   08/05/16 0900  BP: (!) 150/76  Pulse: 62  Resp: 20  Temp: 97.3 F (36.3 C)  SpO2: 97%  Weight: 203 lb (92.1 kg)  Height:  (1.626 m)   Body mass index is 34.84 kg/m. Physical Exam  Labs reviewed: Basic Metabolic Panel:  Recent Labs  16/10/96 1048 07/24/16 0614 07/25/16 0445  NA 140 139 139  K 4.1 4.1 4.8  CL 105 102 107  CO2 GLUCOSE 94 102* 171*  BUN CREATININE 0.54 0.58 0.71  CALCIUM 10.3 10.6* 9.0   Liver Function Tests:  Recent Labs  10/22/15 1235 07/08/16 1048 07/24/16 0614  AST ALT ALKPHOS 63 65 67  BILITOT 0.5 0.9 0.9  PROT 7.2 7.7 7.5  ALBUMIN 4.0 4.1 4.1   No results for input(s): LIPASE, AMYLASE in the last 8760 hours. No results for input(s): AMMONIA in the last 8760 hours. CBC:  Recent Labs  10/22/15 1235 07/08/16 1048 07/24/16 0614 07/25/16 0445 07/26/16 0258 07/30/16  WBC 8.1 7.2 7.4 10.6* 14.6* 8.4  NEUTROABS 4,536 3.8 5.0  --   --   --   HGB 12.2 12.7 12.1 9.0* 8.7* 9.5*  HCT 37.8 40.0 39.2 28.8* 28.0* 30*  MCV 91.5 93.0 94.2 92.9 93.3  --   PLT 329 293 303 215 240 296   Cardiac Enzymes: No results for input(s): CKTOTAL, CKMB, CKMBINDEX, TROPONINI in the last 8760 hours. BNP: Invalid input(s): POCBNP CBG: No results for input(s): GLUCAP in the last 8760 hours.  Procedures and Imaging Studies During Stay: Dg Chest 2 View  Result Date:  07/08/2016 CLINICAL DATA:  Preoperative examination prior right hip joint replacement. History of hypertension, morbid obesity, former smoker. EXAM: CHEST  2 VIEW COMPARISON:  PA and lateral chest x-ray of January 11, 2012 FINDINGS: The lungs are mildly hyperinflated. There is no focal infiltrate. There is no pleural effusion. The heart and pulmonary vascularity are normal. There is calcification in the wall of the aortic arch. The patient has undergone previous left shoulder replacement. There is severe degenerative change of the right shoulder. The thoracic vertebral bodies are preserved in height where visualized. IMPRESSION: Chronic bronchitic changes.  No pneumonia nor CHF. Thoracic aortic atherosclerosis. Electronically Signed   By: David  [place] M.D.   On: 07/08/2016 11:37   Dg C-arm 61-120 Min  Result Date: 07/24/2016 CLINICAL DATA:  Status post right hip arthroplasty. EXAM: DG C-ARM 61-120 MIN FLUOROSCOPY TIME:  33 seconds. COMPARISON:  None. FINDINGS: Four intraoperative fluoroscopic images of the right hip were obtained. These demonstrate the patient be status post right total hip arthroplasty. The femoral and acetabular components appear to be well situated. IMPRESSION: Status post right total hip arthroplasty. Electronically Signed   By: Lupita Raider, M.D.   On: 07/24/2016 10:07   Dg Hip Operative Unilat W Or W/o Pelvis Right  Result Date: 07/24/2016 CLINICAL DATA:  Hip replacement EXAM: OPERATIVE RIGHT HIP (WITH PELVIS IF PERFORMED) 4 VIEWS TECHNIQUE: Fluoroscopic spot image(s) were submitted for interpretation post-operatively. COMPARISON:  None. FINDINGS: Changes of right hip replacement noted. Normal AP alignment. No hardware or bony complicating feature. IMPRESSION: Right hip replacement.  No visible complicating feature. Electronically Signed  By: Charlett Nose M.D.   On: 07/24/2016 10:10    Assessment/Plan:   There are no diagnoses linked to this encounter.   Patient is being  discharged with the following home health services:    Patient is being discharged with the following durable medical equipment:    Patient has been advised to f/u with their PCP in 1-2 weeks to bring them up to date on their rehab stay.  Social services at facility was responsible for arranging this appointment.  Pt was provided with a 30 day supply of prescriptions for medications and refills must be obtained from their PCP.  For controlled substances, a more limited supply may be provided adequate until PCP appointment only.  Future labs/tests needed:  ***

## 2016-08-09 NOTE — Progress Notes (Signed)
Location:  [facility] Room Number: 1202 Place of Service:  SNF (31)  Provider: Richarda Blade FNP-C   PCP: Nilda Simmer, MD Patient Care Team: Ethelda Chick, MD as PCP - General (Family Medicine) [name], MD as Consulting Physician (Orthopedic Surgery)  Extended Emergency Contact Information Primary Emergency Contact: Reihl,Angelita Address: [address]          [place], [place] [address] [place] of [place] Home Phone: [phone] Work Phone: [phone] Mobile Phone: [phone] Relation: Daughter Secondary Emergency Contact: Shella [place] of [place] Home Phone: [phone] Mobile Phone: [phone] Relation: Son  Code Status: Full code  Goals of care:  Advanced Directive information Advanced Directives 08/05/2016  Does Patient Have a Medical Advance Directive? No  Type of Advance Directive -  Does patient want to make changes to medical advance directive? -  Copy of Healthcare Power of Attorney in Chart? -  Would patient like information on creating a medical advance directive? -  Pre-existing out of facility DNR order (yellow form or pink MOST form) -     Allergies  Allergen Reactions  . Effexor [Venlafaxine Hcl] Swelling    MD ADVISES PATIENT TO NOT TAKE IN FUTURE PT STOPPED, MD DC'd MED  . Sulfa Drugs Cross Reactors Itching    Chief Complaint  Patient presents with  . Discharge Note    Resident is being seen for discharge from SNF.     HPI:  76 y.o. female seen today at [facility] for discharge home.she was here for short term rehabilitation for post hospital admission from 07/24/16-07/26/16 with primary right hip OA.She underwent right hip arthroplasty on 07/24/16. She was also treated with antibiotics for UTI.She has a a medical history of HTN, Hyperlipidemia, OA, Anxiety, anemia, Obesity among other conditions. She is seen in her room today.She states right hip pain under control with  current medication.She completed Keflex 07/29/2016.She has a follow up appointment with Ortho 08/13/2016.She has had unremarkable stay here in rehab.  She has worked well PT/OT now stable for discharge home.She will be discharged home with outpatient  PT at [facility] Rehab to continue with ROM, Exercise, Gait stability and muscle strengthening. She does not require any DME states has own FWW at home.Outpatient Therapy will be arranged by facility social worker prior to discharge. Prescription medication will be written x 1 month then patient to follow up with PCP in 1-2 weeks. Facility staff report no new concerns.   Past Medical History:  Diagnosis Date  . Anemia    on iron  . Anxiety   . Arthritis    shoulders, knees, hands, hips; status post left knee surgery  . Constipation   . Depression   . GERD (gastroesophageal reflux disease)    TAKES TUMS  . H/O: pneumonia 2003  . Heart murmur    ???   YEARS AGO  . Hyperlipidemia    on med  . Hypertension   . Morbid obesity with BMI of 45.0-49.9, adult (HCC)   . Pneumonia    15-27YRS    Past Surgical History:  Procedure Laterality Date  . CARDIAC CATHETERIZATION  04/20/1996   patent coronaries, EF 74% (Dr. Aram Candela)  . CARPAL TUNNEL RELEASE Left   . CATARACT EXTRACTION, BILATERAL    . COLONOSCOPY    . KNEE SURGERY Left 11/2001  . REPLACEMENT TOTAL KNEE Left 10/2009  . REPLACEMENT TOTAL KNEE Left 10/2009  . SHOULDER OPEN ROTATOR CUFF REPAIR  03/01/2012   Procedure: ROTATOR CUFF REPAIR SHOULDER OPEN;  Surgeon: Mable Paris, MD;  Location: [facility] OR;  Service: Orthopedics;  Laterality: Left;  SUBSCAPULARIS REPAIR  . STRESS TEST - ADENOSINE THALLIUM  1997   apical and inferior segments with questionable infarct and ischemia in anteroseptal & inferoseptal segments (Dr. Standley Brooking)  . SVD     x 7  . TOTAL HIP ARTHROPLASTY Right 07/24/2016  . TOTAL HIP ARTHROPLASTY Right 07/24/2016   Procedure: RIGHT TOTAL HIP ARTHROPLASTY  ANTERIOR APPROACH;  Surgeon: [name], MD;  Location: [facility] OR;  Service: Orthopedics;  Laterality: Right;  . TOTAL SHOULDER ARTHROPLASTY  01/18/2012   Procedure: TOTAL SHOULDER ARTHROPLASTY;  Surgeon: Mable Paris, MD;  Location: [facility] OR;  Service: Orthopedics;  Laterality: Left;  . TOTAL SHOULDER REPLACEMENT Left 01/19/2012  . TRANSTHORACIC ECHOCARDIOGRAM  06/2011   LV mildly dilated, EF=>55%; mild mitral annular calcif, mild MR; mild TR; AV mildly sclerotic, mild aortic regurg  . TUBAL LIGATION    . WISDOM TOOTH EXTRACTION        reports that she quit smoking about 32 years ago. Her smoking use included Cigarettes. She has a 25.00 pack-year smoking history. She has quit using smokeless tobacco. She reports that she does not drink alcohol or use drugs. Social History   Social History  . Marital status: Widowed    Spouse name: N/A  . Number of children: 7  . Years of education: N/A   Occupational History  . retired Retired   Social History Main Topics  . Smoking status: Former Smoker    Packs/day: 1.00    Years: 25.00    Types: Cigarettes    Quit date: 03/07/1984  . Smokeless tobacco: Former Neurosurgeon  . Alcohol use No  . Drug use: No  . Sexual activity: No   Other Topics Concern  . Not on file   Social History Narrative   Marital status:   She is a widowed since 2009; married 45 years      Children:   mother of 16, grandmother of 5, great-grandmother of 6.      Lives: alone with home; daughter, son lives with patient      Tobacco: quit smoking 34 years ago      Alcohol: none      ADLs:  Walks with cane; drives.    She does not drink alcohol or smoke cigarettes.   She previously tried exercise routinely but is not exercising as much lately. Her excuse is do to knee pain and back pain as well as cold weather.    Allergies  Allergen Reactions  . Effexor [Venlafaxine Hcl] Swelling    MD ADVISES PATIENT TO NOT TAKE IN FUTURE PT STOPPED, MD DC'd MED  . Sulfa Drugs  Cross Reactors Itching    Pertinent  Health Maintenance Due  Topic Date Due  . COLONOSCOPY  09/02/1992  . DEXA SCAN  09/03/2007  . INFLUENZA VACCINE  11/11/2016  . MAMMOGRAM  12/16/2017  . PNA vac Low Risk Adult  Completed    Medications: Allergies as of 08/05/2016      Reactions   Effexor [venlafaxine Hcl] Swelling   MD ADVISES PATIENT TO NOT TAKE IN FUTURE PT STOPPED, MD DC'd MED   Sulfa Drugs Cross Reactors Itching      Medication List       Accurate as of 08/05/16 11:59 PM. Always use your most recent med list.  aspirin EC 325 MG tablet Take 1 tablet (325 mg total) by mouth 2 (two) times daily after a meal. Take x 1 month post op to decrease risk of blood clots.   docusate sodium 100 MG capsule Commonly known as:  COLACE Take 1 capsule (100 mg total) by mouth 2 (two) times daily.   Iron 325 (65 Fe) MG Tabs Take 1 tablet by mouth every morning.   lisinopril 20 MG tablet Commonly known as:  PRINIVIL,ZESTRIL TAKE 1 TABLET BY MOUTH EVERY DAY   LORazepam 0.5 MG tablet Commonly known as:  ATIVAN Take 1 tablet (0.5 mg total) by mouth daily as needed for anxiety.   oxyCODONE-acetaminophen 5-325 MG tablet Commonly known as:  PERCOCET/ROXICET Take 1-2 tablets by mouth every 6 (six) hours as needed for severe pain.   pravastatin 40 MG tablet Commonly known as:  PRAVACHOL Take 1 tablet (40 mg total) by mouth 2 (two) times daily.   tiZANidine 2 MG tablet Commonly known as:  ZANAFLEX Take 1 tablet (2 mg total) by mouth every 8 (eight) hours as needed for muscle spasms.       Review of Systems  Constitutional: Negative for activity change, appetite change, chills, fatigue and fever.  HENT: Negative for congestion, rhinorrhea, sinus pain, sinus pressure, sneezing and sore throat.   Eyes: Negative.   Respiratory: Negative for cough, chest tightness, shortness of breath and wheezing.   Cardiovascular: Negative for chest pain, palpitations and leg swelling.    Gastrointestinal: Negative for abdominal distention, abdominal pain, diarrhea and nausea.  Endocrine: Negative.   Genitourinary: Negative for dysuria, flank pain, frequency and urgency.  Musculoskeletal: Positive for gait problem.       Right hip pain under control   Skin: Negative for color change, pallor and rash.       Right hip surgical incision   Neurological: Negative for dizziness, seizures, light-headedness and headaches.  Hematological: Does not bruise/bleed easily.  Psychiatric/Behavioral: Negative for agitation, confusion, hallucinations and sleep disturbance. The patient is not nervous/anxious.     Vitals:   08/05/16 0900  BP: (!) 150/76  Pulse: 62  Resp: 20  Temp: 97.3 F (36.3 C)  SpO2: 97%  Weight: 203 lb (92.1 kg)  Height:  (1.626 m)   Body mass index is 34.84 kg/m. Physical Exam  Constitutional: She is oriented to person, place, and time. She appears well-developed and well-nourished. No distress.  HENT:  Head: Normocephalic.  Mouth/Throat: Oropharynx is clear and moist. No oropharyngeal exudate.  Eyes: Conjunctivae and EOM are normal. Pupils are equal, round, and reactive to light. Right eye exhibits no discharge. Left eye exhibits no discharge. No scleral icterus.  Neck: Normal range of motion. No JVD present. No thyromegaly present.  Cardiovascular: Normal rate, regular rhythm, normal heart sounds and intact distal pulses.  Exam reveals no gallop and no friction rub.   No murmur heard. Pulmonary/Chest: Effort normal and breath sounds normal. No respiratory distress. She has no wheezes. She has no rales.  Abdominal: Soft. Bowel sounds are normal. She exhibits no distension. There is no tenderness. There is no rebound and no guarding.  Genitourinary:  Genitourinary Comments: Continent   Musculoskeletal: She exhibits no tenderness or deformity.  Moves x 4 extremities except right hip limited due to pain. Right Leg trace edema.   Lymphadenopathy:    She  has no cervical adenopathy.  Neurological: She is oriented to person, place, and time.  Skin: Skin is warm and dry. No rash noted. No  erythema. No pallor.  Right hip surgical drsg in place dry with small old drainage noted. Surrounding skin tissue without any signs of infections.   Psychiatric: She has a normal mood and affect.    Labs reviewed: Basic Metabolic Panel:  Recent Labs  16/10/96 1048 07/24/16 0614 07/25/16 0445  NA 140 139 139  K 4.1 4.1 4.8  CL 105 102 107  CO2 26 27 26   GLUCOSE 94 102* 171*  BUN 11 12 8   CREATININE 0.54 0.58 0.71  CALCIUM 10.3 10.6* 9.0   Liver Function Tests:  Recent Labs  10/22/15 1235 07/08/16 1048 07/24/16 0614  AST 19 24 23   ALT 12 15 14   ALKPHOS 63 65 67  BILITOT 0.5 0.9 0.9  PROT 7.2 7.7 7.5  ALBUMIN 4.0 4.1 4.1   CBC:  Recent Labs  10/22/15 1235 07/08/16 1048 07/24/16 0614 07/25/16 0445 07/26/16 0258 07/30/16  WBC 8.1 7.2 7.4 10.6* 14.6* 8.4  NEUTROABS 4,536 3.8 5.0  --   --   --   HGB 12.2 12.7 12.1 9.0* 8.7* 9.5*  HCT 37.8 40.0 39.2 28.8* 28.0* 30*  MCV 91.5 93.0 94.2 92.9 93.3  --   PLT 329 293 303 215 240 296   Procedures and Imaging Studies During Stay: Dg C-arm 61-120 Min  Result Date: 07/24/2016 CLINICAL DATA:  Status post right hip arthroplasty. EXAM: DG C-ARM 61-120 MIN FLUOROSCOPY TIME:  33 seconds. COMPARISON:  None. FINDINGS: Four intraoperative fluoroscopic images of the right hip were obtained. These demonstrate the patient be status post right total hip arthroplasty. The femoral and acetabular components appear to be well situated. IMPRESSION: Status post right total hip arthroplasty. Electronically Signed   By: Lupita Raider, M.D.   On: 07/24/2016 10:07   Dg Hip Operative Unilat W Or W/o Pelvis Right  Result Date: 07/24/2016 CLINICAL DATA:  Hip replacement EXAM: OPERATIVE RIGHT HIP (WITH PELVIS IF PERFORMED) 4 VIEWS TECHNIQUE: Fluoroscopic spot image(s) were submitted for interpretation  post-operatively. COMPARISON:  None. FINDINGS: Changes of right hip replacement noted. Normal AP alignment. No hardware or bony complicating feature. IMPRESSION: Right hip replacement.  No visible complicating feature. Electronically Signed   By: Charlett Nose M.D.   On: 07/24/2016 10:10    Assessment/Plan:   1. Unsteady gait   Has worked well with PT/ OT.Will be discharged home with outpatient  PT at [facility] to continue with ROM, Exercise, Gait stability and muscle strengthening. She does not require any DME states has own FWW at home. Fall and safety precautions.  2. Essential hypertension SBP in the 150's this visit. Continue to monitor then adjust med if needed.Continue on lisinopril 20 mg tablet. BMP in 1-2 weeks with PCP.   3. Hyperlipidemia with target LDL less than 100 Continue on Pravastatin.monitor Lipid panel periodically.   4. Generalized anxiety disorder Stable. Continue on lorazepam.   5. Iron deficiency anemia Hgb stable post-op. Continue on ferrous sulfate.CBC in 1-2 weeks with PCP.   6. Slow transit constipation Current regimen effective. Continue oral and fluid intake.   7. Status post right hip arthroplasty Pain under control. Continue on ASA 325 mg Tablet twice daly.continue on Percocet for pain wean off as tolerated. Follow up with [facility] as scheduled. Outpatient PT at [facility] to continue with ROM, Exercise, Gait stability and muscle strengthening.  Patient is being discharged with the following home health services:   - None Will be discharged home with outpatient  PT at  [facility] to continue with ROM, Exercise, Gait stability and muscle strengthening.  Patient is being discharged with the following durable medical equipment:   - None required has own FWW at home.    Patient has been advised to f/u with their PCP in 1-2 weeks to for a transitions of care visit.Social services at their facility was responsible for  arranging this appointment.  Pt was provided with adequate prescriptions of noncontrolled medications to reach the scheduled appointment.For controlled substances, a limited supply was provided as appropriate for the individual patient. If the pt normally receives these medications from a pain clinic or has a contract with another physician, these medications should be received from that clinic or physician only).  Future labs/tests needed:  CBC, BMP in 1-2 weeks PCP

## 2016-08-10 DIAGNOSIS — M1611 Unilateral primary osteoarthritis, right hip: Secondary | ICD-10-CM | POA: Diagnosis not present

## 2016-08-12 DIAGNOSIS — M6281 Muscle weakness (generalized): Secondary | ICD-10-CM | POA: Diagnosis not present

## 2016-08-12 DIAGNOSIS — R2689 Other abnormalities of gait and mobility: Secondary | ICD-10-CM | POA: Diagnosis not present

## 2016-08-13 ENCOUNTER — Other Ambulatory Visit: Payer: Self-pay | Admitting: Emergency Medicine

## 2016-08-13 DIAGNOSIS — R2689 Other abnormalities of gait and mobility: Secondary | ICD-10-CM | POA: Diagnosis not present

## 2016-08-13 DIAGNOSIS — M6281 Muscle weakness (generalized): Secondary | ICD-10-CM | POA: Diagnosis not present

## 2016-08-13 NOTE — Telephone Encounter (Signed)
Request for refill Lorazepam 0.5 MG Last filled 4/10 Last OV 3/30 Last OV that addressed anxiety 2017

## 2016-08-14 DIAGNOSIS — R2689 Other abnormalities of gait and mobility: Secondary | ICD-10-CM | POA: Diagnosis not present

## 2016-08-14 DIAGNOSIS — M6281 Muscle weakness (generalized): Secondary | ICD-10-CM | POA: Diagnosis not present

## 2016-08-14 NOTE — Telephone Encounter (Signed)
Called to [facility]. 

## 2016-08-14 NOTE — Telephone Encounter (Signed)
Lorazepam approved; please call into pharmacy.

## 2016-08-17 DIAGNOSIS — M6281 Muscle weakness (generalized): Secondary | ICD-10-CM | POA: Diagnosis not present

## 2016-08-17 DIAGNOSIS — R2689 Other abnormalities of gait and mobility: Secondary | ICD-10-CM | POA: Diagnosis not present

## 2016-08-19 DIAGNOSIS — M6281 Muscle weakness (generalized): Secondary | ICD-10-CM | POA: Diagnosis not present

## 2016-08-19 DIAGNOSIS — R2689 Other abnormalities of gait and mobility: Secondary | ICD-10-CM | POA: Diagnosis not present

## 2016-08-21 DIAGNOSIS — R2689 Other abnormalities of gait and mobility: Secondary | ICD-10-CM | POA: Diagnosis not present

## 2016-08-21 DIAGNOSIS — M6281 Muscle weakness (generalized): Secondary | ICD-10-CM | POA: Diagnosis not present

## 2016-08-24 DIAGNOSIS — R2689 Other abnormalities of gait and mobility: Secondary | ICD-10-CM | POA: Diagnosis not present

## 2016-08-24 DIAGNOSIS — M6281 Muscle weakness (generalized): Secondary | ICD-10-CM | POA: Diagnosis not present

## 2016-08-25 DIAGNOSIS — M1611 Unilateral primary osteoarthritis, right hip: Secondary | ICD-10-CM | POA: Diagnosis not present

## 2016-08-26 DIAGNOSIS — R2689 Other abnormalities of gait and mobility: Secondary | ICD-10-CM | POA: Diagnosis not present

## 2016-08-26 DIAGNOSIS — M6281 Muscle weakness (generalized): Secondary | ICD-10-CM | POA: Diagnosis not present

## 2016-08-28 DIAGNOSIS — R2689 Other abnormalities of gait and mobility: Secondary | ICD-10-CM | POA: Diagnosis not present

## 2016-08-28 DIAGNOSIS — M6281 Muscle weakness (generalized): Secondary | ICD-10-CM | POA: Diagnosis not present

## 2016-09-01 DIAGNOSIS — M6281 Muscle weakness (generalized): Secondary | ICD-10-CM | POA: Diagnosis not present

## 2016-09-01 DIAGNOSIS — R2689 Other abnormalities of gait and mobility: Secondary | ICD-10-CM | POA: Diagnosis not present

## 2016-09-03 DIAGNOSIS — M6281 Muscle weakness (generalized): Secondary | ICD-10-CM | POA: Diagnosis not present

## 2016-09-03 DIAGNOSIS — R2689 Other abnormalities of gait and mobility: Secondary | ICD-10-CM | POA: Diagnosis not present

## 2016-09-08 DIAGNOSIS — R2689 Other abnormalities of gait and mobility: Secondary | ICD-10-CM | POA: Diagnosis not present

## 2016-09-08 DIAGNOSIS — M6281 Muscle weakness (generalized): Secondary | ICD-10-CM | POA: Diagnosis not present

## 2016-09-10 DIAGNOSIS — R2689 Other abnormalities of gait and mobility: Secondary | ICD-10-CM | POA: Diagnosis not present

## 2016-09-10 DIAGNOSIS — M6281 Muscle weakness (generalized): Secondary | ICD-10-CM | POA: Diagnosis not present

## 2016-09-12 ENCOUNTER — Other Ambulatory Visit: Payer: Self-pay | Admitting: Physician Assistant

## 2016-09-15 DIAGNOSIS — R2689 Other abnormalities of gait and mobility: Secondary | ICD-10-CM | POA: Diagnosis not present

## 2016-09-15 DIAGNOSIS — M6281 Muscle weakness (generalized): Secondary | ICD-10-CM | POA: Diagnosis not present

## 2016-09-17 DIAGNOSIS — M6281 Muscle weakness (generalized): Secondary | ICD-10-CM | POA: Diagnosis not present

## 2016-09-17 DIAGNOSIS — R2689 Other abnormalities of gait and mobility: Secondary | ICD-10-CM | POA: Diagnosis not present

## 2016-09-24 DIAGNOSIS — M25551 Pain in right hip: Secondary | ICD-10-CM | POA: Diagnosis not present

## 2016-09-24 DIAGNOSIS — Z9889 Other specified postprocedural states: Secondary | ICD-10-CM | POA: Diagnosis not present

## 2016-09-24 DIAGNOSIS — Z96641 Presence of right artificial hip joint: Secondary | ICD-10-CM | POA: Diagnosis not present

## 2016-10-26 DIAGNOSIS — M1612 Unilateral primary osteoarthritis, left hip: Secondary | ICD-10-CM | POA: Diagnosis not present

## 2016-10-26 DIAGNOSIS — Z96641 Presence of right artificial hip joint: Secondary | ICD-10-CM | POA: Diagnosis not present

## 2016-10-26 DIAGNOSIS — Z09 Encounter for follow-up examination after completed treatment for conditions other than malignant neoplasm: Secondary | ICD-10-CM | POA: Diagnosis not present

## 2016-11-06 ENCOUNTER — Other Ambulatory Visit: Payer: Self-pay | Admitting: Family Medicine

## 2016-11-06 DIAGNOSIS — Z1231 Encounter for screening mammogram for malignant neoplasm of breast: Secondary | ICD-10-CM

## 2016-12-11 DIAGNOSIS — N8111 Cystocele, midline: Secondary | ICD-10-CM | POA: Diagnosis not present

## 2016-12-11 DIAGNOSIS — N814 Uterovaginal prolapse, unspecified: Secondary | ICD-10-CM | POA: Diagnosis not present

## 2016-12-11 DIAGNOSIS — N816 Rectocele: Secondary | ICD-10-CM | POA: Diagnosis not present

## 2016-12-17 ENCOUNTER — Ambulatory Visit
Admission: RE | Admit: 2016-12-17 | Discharge: 2016-12-17 | Disposition: A | Discharge status: Home / Self Care | Payer: MEDICARE | Source: Ambulatory Visit | Attending: Family Medicine | Admitting: Family Medicine

## 2016-12-17 DIAGNOSIS — Z1231 Encounter for screening mammogram for malignant neoplasm of breast: Secondary | ICD-10-CM | POA: Diagnosis not present

## 2016-12-22 DIAGNOSIS — M1612 Unilateral primary osteoarthritis, left hip: Secondary | ICD-10-CM | POA: Diagnosis not present

## 2016-12-23 DIAGNOSIS — Z471 Aftercare following joint replacement surgery: Secondary | ICD-10-CM | POA: Diagnosis not present

## 2016-12-23 DIAGNOSIS — M160 Bilateral primary osteoarthritis of hip: Secondary | ICD-10-CM | POA: Diagnosis not present

## 2016-12-23 DIAGNOSIS — Z96641 Presence of right artificial hip joint: Secondary | ICD-10-CM | POA: Diagnosis not present

## 2016-12-29 DIAGNOSIS — Z96641 Presence of right artificial hip joint: Secondary | ICD-10-CM | POA: Diagnosis not present

## 2016-12-29 DIAGNOSIS — Z471 Aftercare following joint replacement surgery: Secondary | ICD-10-CM | POA: Diagnosis not present

## 2016-12-29 DIAGNOSIS — M160 Bilateral primary osteoarthritis of hip: Secondary | ICD-10-CM | POA: Diagnosis not present

## 2016-12-31 DIAGNOSIS — Z96641 Presence of right artificial hip joint: Secondary | ICD-10-CM | POA: Diagnosis not present

## 2016-12-31 DIAGNOSIS — M160 Bilateral primary osteoarthritis of hip: Secondary | ICD-10-CM | POA: Diagnosis not present

## 2016-12-31 DIAGNOSIS — Z471 Aftercare following joint replacement surgery: Secondary | ICD-10-CM | POA: Diagnosis not present

## 2017-01-05 DIAGNOSIS — Z96641 Presence of right artificial hip joint: Secondary | ICD-10-CM | POA: Diagnosis not present

## 2017-01-05 DIAGNOSIS — Z471 Aftercare following joint replacement surgery: Secondary | ICD-10-CM | POA: Diagnosis not present

## 2017-01-05 DIAGNOSIS — M160 Bilateral primary osteoarthritis of hip: Secondary | ICD-10-CM | POA: Diagnosis not present

## 2017-01-12 ENCOUNTER — Ambulatory Visit: Payer: MEDICARE | Admitting: Family Medicine

## 2017-01-12 DIAGNOSIS — M160 Bilateral primary osteoarthritis of hip: Secondary | ICD-10-CM | POA: Diagnosis not present

## 2017-01-12 DIAGNOSIS — Z471 Aftercare following joint replacement surgery: Secondary | ICD-10-CM | POA: Diagnosis not present

## 2017-01-12 DIAGNOSIS — Z96641 Presence of right artificial hip joint: Secondary | ICD-10-CM | POA: Diagnosis not present

## 2017-01-13 ENCOUNTER — Other Ambulatory Visit: Payer: Self-pay | Admitting: Family Medicine

## 2017-01-14 DIAGNOSIS — Z471 Aftercare following joint replacement surgery: Secondary | ICD-10-CM | POA: Diagnosis not present

## 2017-01-14 DIAGNOSIS — Z96641 Presence of right artificial hip joint: Secondary | ICD-10-CM | POA: Diagnosis not present

## 2017-01-14 DIAGNOSIS — M160 Bilateral primary osteoarthritis of hip: Secondary | ICD-10-CM | POA: Diagnosis not present

## 2017-01-15 NOTE — Telephone Encounter (Signed)
Please CALL IN Lorazepam as approved.

## 2017-01-15 NOTE — Telephone Encounter (Signed)
Called Rx into [facility] pharmacy.

## 2017-01-19 DIAGNOSIS — M1612 Unilateral primary osteoarthritis, left hip: Secondary | ICD-10-CM | POA: Diagnosis not present

## 2017-01-19 DIAGNOSIS — Z96641 Presence of right artificial hip joint: Secondary | ICD-10-CM | POA: Diagnosis not present

## 2017-01-19 DIAGNOSIS — Z09 Encounter for follow-up examination after completed treatment for conditions other than malignant neoplasm: Secondary | ICD-10-CM | POA: Diagnosis not present

## 2017-01-20 ENCOUNTER — Encounter: Payer: Self-pay | Admitting: Family Medicine

## 2017-01-20 ENCOUNTER — Ambulatory Visit (INDEPENDENT_AMBULATORY_CARE_PROVIDER_SITE_OTHER): Payer: MEDICARE | Admitting: Family Medicine

## 2017-01-20 VITALS — BP 128/78 | HR 78 | Temp 98.0°F | Resp 16 | Ht 60.0 in | Wt 187.0 lb

## 2017-01-20 DIAGNOSIS — Z01818 Encounter for other preprocedural examination: Secondary | ICD-10-CM | POA: Diagnosis not present

## 2017-01-20 DIAGNOSIS — E8881 Metabolic syndrome: Secondary | ICD-10-CM | POA: Diagnosis not present

## 2017-01-20 DIAGNOSIS — K219 Gastro-esophageal reflux disease without esophagitis: Secondary | ICD-10-CM | POA: Diagnosis not present

## 2017-01-20 DIAGNOSIS — Z23 Encounter for immunization: Secondary | ICD-10-CM

## 2017-01-20 DIAGNOSIS — F411 Generalized anxiety disorder: Secondary | ICD-10-CM

## 2017-01-20 DIAGNOSIS — Z6836 Body mass index (BMI) 36.0-36.9, adult: Secondary | ICD-10-CM | POA: Diagnosis not present

## 2017-01-20 DIAGNOSIS — I10 Essential (primary) hypertension: Secondary | ICD-10-CM

## 2017-01-20 DIAGNOSIS — E785 Hyperlipidemia, unspecified: Secondary | ICD-10-CM

## 2017-01-20 DIAGNOSIS — M1612 Unilateral primary osteoarthritis, left hip: Secondary | ICD-10-CM | POA: Diagnosis not present

## 2017-01-20 MED ORDER — LISINOPRIL 20 MG PO TABS: 20.0000 mg | ORAL_TABLET | Freq: Every day | ORAL | 1 refills | Status: DC

## 2017-01-20 MED ORDER — PRAVASTATIN SODIUM 40 MG PO TABS: 40.0000 mg | ORAL_TABLET | Freq: Two times a day (BID) | ORAL | 1 refills | Status: DC

## 2017-01-20 MED ORDER — ZOSTER VAC RECOMB ADJUVANTED 50 MCG/0.5ML IM SUSR: 0.5000 mL | Freq: Once | INTRAMUSCULAR | 1 refills | Status: AC

## 2017-01-20 NOTE — Patient Instructions (Signed)
     IF you received an x-ray today, you will receive an invoice from [facility]. Please contact [facility] at [phone] with questions or concerns regarding your invoice.   IF you received labwork today, you will receive an invoice from [facility]. Please contact [facility] at [phone] with questions or concerns regarding your invoice.   Our billing staff will not be able to assist you with questions regarding bills from these companies.  You will be contacted with the lab results as soon as they are available. The fastest way to get your results is to activate your My Chart account. Instructions are located on the last page of this paperwork. If you have not heard from us regarding the results in 2 weeks, please contact this office.     

## 2017-01-20 NOTE — Progress Notes (Signed)
Subjective:    Patient ID: [name], female    DOB: Aug 26, 1940, 76 y.o.   MRN: [id number]  01/20/2017  Hypertension (6 month follow-up ); Hyperlipidemia; and Depression (with anxiety )    HPI This 76 y.o. female presents for evaluation of hypertension and hypercholesterolemia and osteoarthritis hips.  Home BPs running "good".  Patient reports good compliance with medication, good tolerance to medication, and good symptom control.    Surgery scheduled for THR LEFT.  Needs to schedule. Must receive medical clearance to undergo THR.   Denies chest pain, DOE, orthopnea, worsening leg swelling.    Multiple deaths in family lately; usually taking Lorazepam every other day; twice weekly on average.  Usually needs Lorazepam at bedtime but does not take daily.    BP Readings from Last 3 Encounters:  01/20/17 128/78  08/05/16 (!) 150/76  07/28/16 134/88   Wt Readings from Last 3 Encounters:  01/20/17 187 lb (84.8 kg)  08/05/16 203 lb (92.1 kg)  07/28/16 199 lb (90.3 kg)   Immunization History  Administered Date(s) Administered  . Influenza,inj,Quad PF,6+ Mos 12/06/2014, 07/10/2016  . PPD Test 07/26/2016  . Pneumococcal Conjugate-13 12/06/2014  . Pneumococcal Polysaccharide-23 01/18/2009  . Td 08/28/2015    Review of Systems  Constitutional: Negative for chills, diaphoresis, fatigue and fever.  Eyes: Negative for visual disturbance.  Respiratory: Negative for cough and shortness of breath.   Cardiovascular: Negative for chest pain, palpitations and leg swelling.  Gastrointestinal: Negative for abdominal pain, constipation, diarrhea, nausea and vomiting.  Endocrine: Negative for cold intolerance, heat intolerance, polydipsia, polyphagia and polyuria.  Musculoskeletal: Positive for arthralgias and gait problem.  Neurological: Negative for dizziness, tremors, seizures, syncope, facial asymmetry, speech difficulty, weakness, light-headedness, numbness and headaches.    Psychiatric/Behavioral: Positive for sleep disturbance. Negative for self-injury. The patient is nervous/anxious.     Past Medical History:  Diagnosis Date  . Anemia    on iron  . Anxiety   . Arthritis    shoulders, knees, hands, hips; status post left knee surgery  . Constipation   . Depression   . GERD (gastroesophageal reflux disease)    TAKES TUMS  . H/O: pneumonia 2003  . Heart murmur    ???   YEARS AGO  . Hyperlipidemia    on med  . Hypertension   . Morbid obesity with BMI of 45.0-49.9, adult (HCC)   . Pneumonia    15-79YRS   Past Surgical History:  Procedure Laterality Date  . CARDIAC CATHETERIZATION  04/20/1996   patent coronaries, EF 74% (Dr. Aram Candela)  . CARPAL TUNNEL RELEASE Left   . CATARACT EXTRACTION, BILATERAL    . COLONOSCOPY    . KNEE SURGERY Left 11/2001  . REPLACEMENT TOTAL KNEE Left 10/2009  . REPLACEMENT TOTAL KNEE Left 10/2009  . SHOULDER OPEN ROTATOR CUFF REPAIR  03/01/2012   Procedure: ROTATOR CUFF REPAIR SHOULDER OPEN;  Surgeon: Mable Paris, MD;  Location: [facility] OR;  Service: Orthopedics;  Laterality: Left;  SUBSCAPULARIS REPAIR  . STRESS TEST - ADENOSINE THALLIUM  1997   apical and inferior segments with questionable infarct and ischemia in anteroseptal & inferoseptal segments (Dr. Standley Brooking)  . SVD     x 7  . TOTAL HIP ARTHROPLASTY Right 07/24/2016  . TOTAL HIP ARTHROPLASTY Right 07/24/2016   Procedure: RIGHT TOTAL HIP ARTHROPLASTY ANTERIOR APPROACH;  Surgeon: [name], MD;  Location: [facility] OR;  Service: Orthopedics;  Laterality: Right;  . TOTAL SHOULDER ARTHROPLASTY  01/18/2012  Procedure: TOTAL SHOULDER ARTHROPLASTY;  Surgeon: Mable Paris, MD;  Location: [facility] OR;  Service: Orthopedics;  Laterality: Left;  . TOTAL SHOULDER REPLACEMENT Left 01/19/2012  . TRANSTHORACIC ECHOCARDIOGRAM  06/2011   LV mildly dilated, EF=>55%; mild mitral annular calcif, mild MR; mild TR; AV mildly sclerotic, mild aortic regurg  . TUBAL LIGATION     . WISDOM TOOTH EXTRACTION     Allergies  Allergen Reactions  . Effexor [Venlafaxine Hcl] Swelling    MD ADVISES PATIENT TO NOT TAKE IN FUTURE PT STOPPED, MD DC'd MED  . Sulfa Drugs Cross Reactors Itching   Current Outpatient Prescriptions on File Prior to Visit  Medication Sig Dispense Refill  . aspirin EC 325 MG tablet Take 1 tablet (325 mg total) by mouth 2 (two) times daily after a meal. Take x 1 month post op to decrease risk of blood clots. 60 tablet 0  . docusate sodium (COLACE) 100 MG capsule Take 1 capsule (100 mg total) by mouth 2 (two) times daily. 30 capsule 0  . Ferrous Sulfate (IRON) 325 (65 Fe) MG TABS Take 1 tablet by mouth every morning.    Marland Kitchen LORazepam (ATIVAN) 0.5 MG tablet TAKE 1 TABLET BY MOUTH EVERY 8 HOURS FOR ANXIETY 30 tablet 0  . oxyCODONE-acetaminophen (PERCOCET/ROXICET) 5-325 MG tablet Take 1-2 tablets by mouth every 6 (six) hours as needed for severe pain. 60 tablet 0  . tiZANidine (ZANAFLEX) 2 MG tablet Take 1 tablet (2 mg total) by mouth every 8 (eight) hours as needed for muscle spasms. 50 tablet 0   No current facility-administered medications on file prior to visit.    Social History   Social History  . Marital status: Widowed    Spouse name: N/A  . Number of children: 7  . Years of education: N/A   Occupational History  . retired Retired   Social History Main Topics  . Smoking status: Former Smoker    Packs/day: 1.00    Years: 25.00    Types: Cigarettes    Quit date: 03/07/1984  . Smokeless tobacco: Former Neurosurgeon  . Alcohol use No  . Drug use: No  . Sexual activity: No   Other Topics Concern  . Not on file   Social History Narrative   Marital status:   She is a widowed since 2009; married 45 years      Children:   mother of 64, grandmother of 5, great-grandmother of 6.      Lives: alone with home; daughter, son lives with patient      Tobacco: quit smoking 34 years ago      Alcohol: none      ADLs:  Walks with cane; drives.     She  does not drink alcohol or smoke cigarettes.   She previously tried exercise routinely but is not exercising as much lately. Her excuse is do to knee pain and back pain as well as cold weather.   Family History  Problem Relation Age of Onset  . Heart disease Mother   . Heart disease Father   . Heart disease Sister   . Hypertension Sister   . Heart disease Sister   . Hypertension Sister   . Heart disease Sister   . Hypertension Sister        Objective:    BP 128/78   Pulse 78   Temp 98 F (36.7 C) (Oral)   Resp 16   Ht 5' (1.524 m)   Wt 187 lb (84.8 kg)  SpO2 95%   BMI 36.52 kg/m  Physical Exam  Constitutional: She is oriented to person, place, and time. She appears well-developed and well-nourished. No distress.  HENT:  Head: Normocephalic and atraumatic.  Eyes: Pupils are equal, round, and reactive to light. Conjunctivae are normal.  Neck: Normal range of motion. Neck supple.  Cardiovascular: Normal rate, regular rhythm and normal heart sounds.  Exam reveals no gallop and no friction rub.   No murmur heard. Pulmonary/Chest: Effort normal and breath sounds normal. She has no wheezes. She has no rales.  Neurological: She is alert and oriented to person, place, and time.  Skin: She is not diaphoretic.  Psychiatric: She has a normal mood and affect. Her behavior is normal.  Nursing note and vitals reviewed.  No results found. Depression screen [facility] 2/9 01/20/2017 07/10/2016 10/22/2015 06/13/2015 12/06/2014  Decreased Interest 1 0 0 0 0  Down, Depressed, Hopeless 1 0 0 0 0  PHQ - 2 Score 2 0 0 0 0  Altered sleeping 0 - - - -  Tired, decreased energy 1 - - - -  Change in appetite 0 - - - -  Feeling bad or failure about yourself  0 - - - -  Trouble concentrating 0 - - - -  Moving slowly or fidgety/restless 0 - - - -  Suicidal thoughts 0 - - - -  PHQ-9 Score 3 - - - -   Fall Risk  01/20/2017 07/10/2016 10/22/2015 06/13/2015 12/06/2014  Falls in the past year? No No No No No         Assessment & Plan:   1. Essential hypertension   2. Gastroesophageal reflux disease without esophagitis   3. Hyperlipidemia with target LDL less than 100   4. Metabolic syndrome   5. Need for prophylactic vaccination and inoculation against influenza   6. Primary osteoarthritis of left hip   7. Need for shingles vaccine   8. Class 2 severe obesity due to excess calories with serious comorbidity and body mass index (BMI) of 36.0 to 36.9 in adult (HCC)   9. Anxiety state   10. Preoperative examination    -controlled chronic medical conditions including hypertension, GERD, hypercholesterolemia.  Obtain labs for chronic disease management. -needs pre-operative evaluation for upcoming total hip replacement.  Obtain labs and EKG.  Asymptomatic at this time. --recommend weight loss, exercise for 30-60 minutes five days per week; recommend 1200 kcal restriction per day with a minimum of 60 grams of protein per day. -increase family stressors due to multiple deaths; using Lorazepam qhs as needed for anxiety and insomnia yet not using daily.    Orders Placed This Encounter  Procedures  . Flu Vaccine QUAD 36+ mos IM  . Comprehensive metabolic panel  . Urinalysis, dipstick only  . CBC with Differential/Platelet  . Lipid panel  . EKG 12-Lead   Meds ordered this encounter  Medications  . lisinopril (PRINIVIL,ZESTRIL) 20 MG tablet    Sig: Take 1 tablet (20 mg total) by mouth daily.    Dispense:  90 tablet    Refill:  1  . pravastatin (PRAVACHOL) 40 MG tablet    Sig: Take 1 tablet (40 mg total) by mouth 2 (two) times daily.    Dispense:  180 tablet    Refill:  1  . Zoster Vac Recomb Adjuvanted [facility]) injection    Sig: Inject 0.5 mLs into the muscle once.    Dispense:  0.5 mL    Refill:  1  Return in about 6 months (around 07/21/2017) for complete physical examiniation.   Deshondra Worst Paulita Fujita, M.D. Primary Care at [facility] [address] [place], [place]  [address] [phone] phone [phone] fax

## 2017-01-21 LAB — CBC WITH DIFFERENTIAL/PLATELET
BASOS ABS: 0 10*3/uL (ref 0.0–0.2)
Basos: 1 %
EOS (ABSOLUTE): 0.1 10*3/uL (ref 0.0–0.4)
Eos: 2 %
Hematocrit: 37.6 % (ref 34.0–46.6)
Hemoglobin: 12 g/dL (ref 11.1–15.9)
IMMATURE GRANS (ABS): 0 10*3/uL (ref 0.0–0.1)
IMMATURE GRANULOCYTES: 0 %
LYMPHS: 36 %
Lymphocytes Absolute: 2 10*3/uL (ref 0.7–3.1)
MCH: 29.9 pg (ref 26.6–33.0)
MCHC: 31.9 g/dL (ref 31.5–35.7)
MCV: 94 fL (ref 79–97)
Monocytes Absolute: 0.4 10*3/uL (ref 0.1–0.9)
Monocytes: 8 %
NEUTROS PCT: 53 %
Neutrophils Absolute: 3 10*3/uL (ref 1.4–7.0)
PLATELETS: 305 10*3/uL (ref 150–379)
RBC: 4.01 x10E6/uL (ref 3.77–5.28)
RDW: 15.7 % — AB (ref 12.3–15.4)
WBC: 5.6 10*3/uL (ref 3.4–10.8)

## 2017-01-21 LAB — COMPREHENSIVE METABOLIC PANEL
A/G RATIO: 1.6 (ref 1.2–2.2)
ALK PHOS: 68 IU/L (ref 39–117)
ALT: 12 IU/L (ref 0–32)
AST: 18 IU/L (ref 0–40)
Albumin: 4.6 g/dL (ref 3.5–4.8)
BILIRUBIN TOTAL: 0.4 mg/dL (ref 0.0–1.2)
BUN / CREAT RATIO: 22 (ref 12–28)
BUN: 14 mg/dL (ref 8–27)
CO2: 24 mmol/L (ref 20–29)
Calcium: 10.2 mg/dL (ref 8.7–10.3)
Chloride: 105 mmol/L (ref 96–106)
Creatinine, Ser: 0.64 mg/dL (ref 0.57–1.00)
GFR calc Af Amer: 102 mL/min/{1.73_m2} (ref >59)
GFR calc non Af Amer: 88 mL/min/{1.73_m2} (ref >59)
GLUCOSE: 89 mg/dL (ref 65–99)
Globulin, Total: 2.9 g/dL (ref 1.5–4.5)
POTASSIUM: 4.9 mmol/L (ref 3.5–5.2)
Sodium: 144 mmol/L (ref 134–144)
Total Protein: 7.5 g/dL (ref 6.0–8.5)

## 2017-01-21 LAB — LIPID PANEL
CHOLESTEROL TOTAL: 202 mg/dL — AB (ref 100–199)
Chol/HDL Ratio: 2.5 ratio (ref 0.0–4.4)
HDL: 81 mg/dL (ref >39)
LDL Calculated: 110 mg/dL — ABNORMAL HIGH (ref 0–99)
Triglycerides: 54 mg/dL (ref 0–149)
VLDL CHOLESTEROL CAL: 11 mg/dL (ref 5–40)

## 2017-01-21 LAB — URINALYSIS, DIPSTICK ONLY

## 2017-01-22 ENCOUNTER — Encounter: Payer: Self-pay | Admitting: Family Medicine

## 2017-01-25 ENCOUNTER — Other Ambulatory Visit: Payer: Self-pay | Admitting: Orthopedic Surgery

## 2017-01-28 ENCOUNTER — Telehealth: Payer: Self-pay | Admitting: *Deleted

## 2017-01-28 NOTE — Telephone Encounter (Signed)
   [facility] Pre-operative Risk Assessment    Request for surgical clearance:  1. What type of surgery is being performed? TOTAL LEFT KNEE REPLACEMENT    2. When is this surgery scheduled? 03/24/17   3. Are there any medications that need to be held prior to surgery and how long?ASA   4. Practice name and name of physician performing surgery? [facility]   5. What is your office phone and fax number? [place]    6. Anesthesia type (None, local, MAC, general) UNKNOWN

## 2017-01-28 NOTE — Telephone Encounter (Signed)
    Chart reviewed as part of pre-operative protocol coverage. Patient was contacted 01/28/2017 in reference to pre-operative risk assessment for pending surgery as outlined below.  [name] was last seen on 07/20/16 by for preoperative clearance of R hip.  Since that day, [name] has done well. She uses walker for ambulation. 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 RCRI risk of 0.9%. Duke Activity Status Indux 5.07 METs.   Therefore, based on ACC/AHA guidelines, the patient would be at acceptable risk for the planned procedure without further cardiovascular testing.   Dr. Herbie [name] to advice regarding holding aspirin.   [name],[name] Inlow, PA 01/28/2017, 2:48 PM

## 2017-01-30 NOTE — Telephone Encounter (Signed)
Ok to hold ASA.  Pruitt Taboada, MD  

## 2017-02-01 ENCOUNTER — Telehealth: Payer: Self-pay | Admitting: Family Medicine

## 2017-02-01 NOTE — Telephone Encounter (Signed)
DR Katrinka BlazingSMITH PATIENT STATES THAT THE PHARMACY DIDN'T HAVE THE PRESCRIPTION FOR SHINGLES AND THE PHARMACY WOULD LIKE FOR YOU TO CALL SOMETHING ELSE IN FOR HER FOR HER SHINGLES

## 2017-02-01 NOTE — Telephone Encounter (Signed)
Faxed via EPIC

## 2017-02-02 NOTE — Telephone Encounter (Signed)
Call --- there is not an alternative for the shingles vaccine; she can get the vaccine in the upcoming year when the pharmacy receives additional vaccine.

## 2017-02-04 ENCOUNTER — Other Ambulatory Visit: Payer: Self-pay

## 2017-02-04 NOTE — Progress Notes (Unsigned)
Shingles vaccine order sent to [facility] [place] [facility] pt and advised to get vaccine

## 2017-02-05 NOTE — Telephone Encounter (Signed)
Spoke with pt about vaccine and informed her that there is no alternative shot and to just wait until it is available at the pharmacy.

## 2017-02-08 ENCOUNTER — Telehealth: Payer: Self-pay

## 2017-02-08 NOTE — Telephone Encounter (Signed)
Patient's daughter called me back and verified the patient's colonoscopy (she did not know about the bone density).  I updated patient's record.  Daughter told me her mother is having hip surgery on December 28th and wanted to know if her mother would need surgical clearance.  I told her that depends on the surgery.  She will call the ortho and find out.  If her mother does need to be seen, she will make an appointment.

## 2017-02-16 ENCOUNTER — Telehealth: Payer: Self-pay | Admitting: Family Medicine

## 2017-02-16 NOTE — Telephone Encounter (Signed)
Letter printed and faxed to Dr. Luiz BlareGraves office.

## 2017-02-16 NOTE — Telephone Encounter (Signed)
Please fax letter for medical clearance to Dr. Jodi GeraldsJohn Graves office.  Phone number [phone].  Please call for fax number.

## 2017-02-19 ENCOUNTER — Telehealth: Payer: Self-pay | Admitting: Family Medicine

## 2017-02-19 NOTE — Telephone Encounter (Signed)
Copied from CRM #[id number]. >> Feb 19, 2017  5:20 PM Raquel SarnaHayes, [name] G wrote:  Pt wants to know if she can get a call back on Mon. Pt is stilling having pain in your back and itching.  Surgery on Dec. 28 - Shingles will need to be cleared by then to have surgery.  [facility] [place]

## 2017-02-22 ENCOUNTER — Telehealth: Payer: Self-pay | Admitting: Family Medicine

## 2017-02-22 NOTE — Telephone Encounter (Signed)
[facility]  [place]  [facility]  The    Shingle  Vaccine  On  Back  Order  And  She  Is on there  Waiting  List   And  They  will call  Her They  Will call her  When it  Gets  In    -  Also  [facility]  [address]  [address]  [place]   [place]  [place]  [place]  [place]    Attempted  To leave  Voice   Mail  To let  Her  Know  But the  Box  Is  Full    The  Pharmacist   Stated  They  Did  However  Have  The  Old  [facility]ine  [facility]

## 2017-02-22 NOTE — Telephone Encounter (Signed)
Pt   Wants  To  Know  Where  She   Can  Get  A   Shingles   Shot  Will   Research  For here

## 2017-02-22 NOTE — Telephone Encounter (Signed)
Copied from CRM [phone]. Topic: Inquiry >> Feb 19, 2017  5:20 PM Raquel SarnaHayes, Teresa G wrote:  Pt wants to know if she can get a call back on Mon. Pt is stilling having pain in your back and itching.  Surgery on Dec. 28 - Shingles will need to be cleared by then to have surgery.  [facility]    >> Feb 22, 2017 10:38 AM Eston Mouldavis, Cheri B wrote: Pt called back to check on [facility]  / I tried to call patient back but her VM is full and you can not leave a message.  Per previous  Documentation: Baron HamperBowles, Anthony C, RN  to [facility] [place]     10:39 AM  Note    [facility]  [place]  Has  The    Shingle  Vaccine  On  Back  Order  And  She  Is on there  Waiting  List   And  They  will call  Her They  Will call her  When it  Gets  In    -  Also  Tried   [facility]  [facility]  [address]  [address]  [place]   They  Have  None  In  [place]    Attempted  To leave  Voice   Mail  To let  Her  Know  But the  Box  Is  Full    The  Pharmacist   Stated  They  Did  However  Have  The  Old  [facility]ine  [facility]

## 2017-02-22 NOTE — Telephone Encounter (Signed)
Copied from CRM [phone]. Topic: Inquiry >> Feb 19, 2017  5:20 PM Raquel SarnaHayes, Teresa G wrote:  Pt wants to know if she can get a call back on Mon. Pt is stilling having pain in your back and itching.  Surgery on Dec. 28 - Shingles will need to be cleared by then to have surgery.  [facility]

## 2017-02-23 NOTE — Telephone Encounter (Signed)
Spoke with patient informed her she is [facility] [place].  Patient voiced understanding

## 2017-02-23 NOTE — Telephone Encounter (Signed)
Copied from CRM [phone]. Topic: Inquiry >> Feb 19, 2017  5:20 PM Raquel SarnaHayes, Teresa G wrote:  Pt wants to know if she can get a call back on Mon. Pt is stilling having pain in your back and itching.  Surgery on Dec. 28 - Shingles will need to be cleared by then to have surgery.  [facility]    >> Feb 22, 2017 10:38 AM Eston Mouldavis, Cheri B wrote: Pt called back to check on [facility]

## 2017-03-01 ENCOUNTER — Other Ambulatory Visit: Payer: Self-pay | Admitting: Family Medicine

## 2017-03-01 NOTE — Telephone Encounter (Signed)
Please call in Ativan refill as approved.

## 2017-03-01 NOTE — Telephone Encounter (Signed)
Ativan refill 

## 2017-03-02 NOTE — Telephone Encounter (Signed)
Faxed in

## 2017-03-23 ENCOUNTER — Other Ambulatory Visit: Payer: Self-pay | Admitting: Orthopedic Surgery

## 2017-04-02 ENCOUNTER — Encounter (HOSPITAL_COMMUNITY): Payer: Self-pay

## 2017-04-02 NOTE — Patient Instructions (Addendum)
Your procedure is scheduled on: Friday, Dec. 28, 2018    [facility]   Take [place]  elevators to 3rd floor to  Short Stay Center at 5:30 AM.     Call this number if you have problems the morning of surgery [phone]    Remember: ONLY 1 PERSON MAY GO WITH YOU TO SHORT STAY TO GET  READY MORNING OF YOUR SURGERY.   Do not eat food or drink liquids :After Midnight.    Take these medicines the morning of surgery with A SIP OF WATER: Pravastatin, oxycodone if needed                               You may not have any metal on your body including hair pins, jewelry, and body piercings             Do not wear make-up, lotions, powders, perfumes,or deodorant             Do not wear nail polish.  Do not shave  48 hours prior to surgery.                Do not bring valuables to the hospital. [facility] IS NOT             RESPONSIBLE   FOR VALUABLES.   Contacts, dentures or bridgework may not be worn into surgery.   Leave suitcase in the car. After surgery it may be brought to your room.              Please read over the following fact sheets you were given:  [facility] - Preparing for Surgery Before surgery, you can play an important role.  Because skin is not sterile, your skin needs to be as free of germs as possible.  You can reduce the number of germs on your skin by washing with CHG (chlorahexidine gluconate) soap before surgery.  CHG is an antiseptic cleaner which kills germs and bonds with the skin to continue killing germs even after washing. Please DO NOT use if you have an allergy to CHG or antibacterial soaps.  If your skin becomes reddened/irritated stop using the CHG and inform your nurse when you arrive at Short Stay. Do not shave (including legs and underarms) for at least 48 hours prior to the first CHG shower.  You may shave your face/neck.  Please follow these instructions carefully:  1.  Shower with CHG Soap the night before surgery  and the  morning of surgery.  2.  If you choose to wash your hair, wash your hair first as usual with your normal  shampoo.  3.  After you shampoo, rinse your hair and body thoroughly to remove the shampoo.                             4.  Use CHG as you would any other liquid soap.  You can apply chg directly to the skin and wash.  Gently with a scrungie or clean washcloth.  5.  Apply the CHG Soap to your body ONLY FROM THE NECK DOWN.   Do   not use on face/ open                           Wound or open sores. Avoid  contact with eyes, ears mouth and   genitals (private parts).                       Wash face,  Genitals (private parts) with your normal soap.             6.  Wash thoroughly, paying special attention to the area where your    surgery  will be performed.  7.  Thoroughly rinse your body with warm water from the neck down.  8.  DO NOT shower/wash with your normal soap after using and rinsing off the CHG Soap.                9.  Pat yourself dry with a clean towel.            10.  Wear clean pajamas.            11.  Place clean sheets on your bed the night of your first shower and do not  sleep with pets. Day of Surgery : Do not apply any lotions/deodorants the morning of surgery.  Please wear clean clothes to the hospital/surgery center.  FAILURE TO FOLLOW THESE INSTRUCTIONS MAY RESULT IN THE CANCELLATION OF YOUR SURGERY  PATIENT SIGNATURE_________________________________  NURSE SIGNATURE__________________________________  ________________________________________________________________________   Rogelia MireIncentive Spirometer  An incentive spirometer is a tool that can help keep your lungs clear and active. This tool measures how well you are filling your lungs with each breath. Taking long deep breaths may help reverse or decrease the chance of developing breathing (pulmonary) problems (especially infection) following:  A long period of time when you are unable to move or be  active. BEFORE THE PROCEDURE   If the spirometer includes an indicator to show your best effort, your nurse or respiratory therapist will set it to a desired goal.  If possible, sit up straight or lean slightly forward. Try not to slouch.  Hold the incentive spirometer in an upright position. INSTRUCTIONS FOR USE  1. Sit on the edge of your bed if possible, or sit up as far as you can in bed or on a chair. 2. Hold the incentive spirometer in an upright position. 3. Breathe out normally. 4. Place the mouthpiece in your mouth and seal your lips tightly around it. 5. Breathe in slowly and as deeply as possible, raising the piston or the ball toward the top of the column. 6. Hold your breath for 3-5 seconds or for as long as possible. Allow the piston or ball to fall to the bottom of the column. 7. Remove the mouthpiece from your mouth and breathe out normally. 8. Rest for a few seconds and repeat Steps 1 through 7 at least 10 times every 1-2 hours when you are awake. Take your time and take a few normal breaths between deep breaths. 9. The spirometer may include an indicator to show your best effort. Use the indicator as a goal to work toward during each repetition. 10. After each set of 10 deep breaths, practice coughing to be sure your lungs are clear. If you have an incision (the cut made at the time of surgery), support your incision when coughing by placing a pillow or rolled up towels firmly against it. Once you are able to get out of bed, walk around indoors and cough well. You may stop using the incentive spirometer when instructed by your caregiver.  RISKS AND COMPLICATIONS  Take your time so you do not get  dizzy or light-headed.  If you are in pain, you may need to take or ask for pain medication before doing incentive spirometry. It is harder to take a deep breath if you are having pain. AFTER USE  Rest and breathe slowly and easily.  It can be helpful to keep track of a log of  your progress. Your caregiver can provide you with a simple table to help with this. If you are using the spirometer at home, follow these instructions: [place] IF:   You are having difficultly using the spirometer.  You have trouble using the spirometer as often as instructed.  Your pain medication is not giving enough relief while using the spirometer.  You develop fever of 100.5 F (38.1 C) or higher. SEEK IMMEDIATE MEDICAL CARE IF:   You cough up bloody sputum that had not been present before.  You develop fever of 102 F (38.9 C) or greater.  You develop worsening pain at or near the incision site. MAKE SURE YOU:   Understand these instructions.  Will watch your condition.  Will get help right away if you are not doing well or get worse. Document Released: 08/10/2006 Document Revised: 06/22/2011 Document Reviewed: 10/11/2006 Exit[employer] Patient Information 2014 Exit[employer], [place].   ________________________________________________________________________  WHAT IS A BLOOD TRANSFUSION? Blood Transfusion Information  A transfusion is the replacement of blood or some of its parts. Blood is made up of multiple cells which provide different functions.  Red blood cells carry oxygen and are used for blood loss replacement.  White blood cells fight against infection.  Platelets control bleeding.  Plasma helps clot blood.  Other blood products are available for specialized needs, such as hemophilia or other clotting disorders. BEFORE THE TRANSFUSION  Who gives blood for transfusions?   Healthy volunteers who are fully evaluated to make sure their blood is safe. This is blood bank blood. Transfusion therapy is the safest it has ever been in the practice of medicine. Before blood is taken from a donor, a complete history is taken to make sure that person has no history of diseases nor engages in risky social behavior (examples are intravenous drug use or sexual activity  with multiple partners). The donor's travel history is screened to minimize risk of transmitting infections, such as malaria. The donated blood is tested for signs of infectious diseases, such as HIV and hepatitis. The blood is then tested to be sure it is compatible with you in order to minimize the chance of a transfusion reaction. If you or a relative donates blood, this is often done in anticipation of surgery and is not appropriate for emergency situations. It takes many days to process the donated blood. RISKS AND COMPLICATIONS Although transfusion therapy is very safe and saves many lives, the main dangers of transfusion include:   Getting an infectious disease.  Developing a transfusion reaction. This is an allergic reaction to something in the blood you were given. Every precaution is taken to prevent this. The decision to have a blood transfusion has been considered carefully by your caregiver before blood is given. Blood is not given unless the benefits outweigh the risks. AFTER THE TRANSFUSION  Right after receiving a blood transfusion, you will usually feel much better and more energetic. This is especially true if your red blood cells have gotten low (anemic). The transfusion raises the level of the red blood cells which carry oxygen, and this usually causes an energy increase.  The nurse administering the transfusion will  monitor you carefully for complications. HOME CARE INSTRUCTIONS  No special instructions are needed after a transfusion. You may find your energy is better. Speak with your caregiver about any limitations on activity for underlying diseases you may have. SEEK MEDICAL CARE IF:   Your condition is not improving after your transfusion.  You develop redness or irritation at the intravenous (IV) site. SEEK IMMEDIATE MEDICAL CARE IF:  Any of the following symptoms occur over the next 12 hours:  Shaking chills.  You have a temperature by mouth above 102 F (38.9  C), not controlled by medicine.  Chest, back, or muscle pain.  People around you feel you are not acting correctly or are confused.  Shortness of breath or difficulty breathing.  Dizziness and fainting.  You get a rash or develop hives.  You have a decrease in urine output.  Your urine turns a dark color or changes to pink, red, or brown. Any of the following symptoms occur over the next 10 days:  You have a temperature by mouth above 102 F (38.9 C), not controlled by medicine.  Shortness of breath.  Weakness after normal activity.  The white part of the eye turns yellow (jaundice).  You have a decrease in the amount of urine or are urinating less often.  Your urine turns a dark color or changes to pink, red, or brown. Document Released: 03/27/2000 Document Revised: 06/22/2011 Document Reviewed: 11/14/2007 [employer] Patient Information 2014 [place], [place].  _______________________________________________________________________

## 2017-04-02 NOTE — Pre-Procedure Instructions (Signed)
The following are in epic: Cardiac Clearance Bhagat 01/28/17 EKG 01/20/17 Last office visit note Dr. Katrinka BlazingSmith 01/20/17 CXR 07/08/16

## 2017-04-07 ENCOUNTER — Other Ambulatory Visit: Payer: Self-pay

## 2017-04-07 ENCOUNTER — Encounter (HOSPITAL_COMMUNITY): Payer: Self-pay

## 2017-04-07 ENCOUNTER — Encounter (HOSPITAL_COMMUNITY)
Admission: RE | Admit: 2017-04-07 | Discharge: 2017-04-07 | Disposition: A | Discharge status: Home / Self Care | Payer: MEDICARE | Source: Ambulatory Visit | Attending: Orthopedic Surgery | Admitting: Orthopedic Surgery

## 2017-04-07 ENCOUNTER — Ambulatory Visit (HOSPITAL_COMMUNITY)
Admission: RE | Admit: 2017-04-07 | Discharge: 2017-04-07 | Disposition: A | Discharge status: Home / Self Care | Payer: MEDICARE | Source: Ambulatory Visit | Attending: Orthopedic Surgery | Admitting: Orthopedic Surgery

## 2017-04-07 DIAGNOSIS — Z01818 Encounter for other preprocedural examination: Secondary | ICD-10-CM

## 2017-04-07 DIAGNOSIS — M1612 Unilateral primary osteoarthritis, left hip: Secondary | ICD-10-CM | POA: Insufficient documentation

## 2017-04-07 DIAGNOSIS — I700 Atherosclerosis of aorta: Secondary | ICD-10-CM | POA: Insufficient documentation

## 2017-04-07 DIAGNOSIS — Z01812 Encounter for preprocedural laboratory examination: Secondary | ICD-10-CM | POA: Insufficient documentation

## 2017-04-07 HISTORY — DX: Atherosclerosis of aorta: I70.0

## 2017-04-07 HISTORY — DX: Zoster without complications: B02.9

## 2017-04-07 HISTORY — DX: Unspecified chronic bronchitis: J42

## 2017-04-07 LAB — CBC WITH DIFFERENTIAL/PLATELET
BASOS PCT: 0 %
Basophils Absolute: 0 10*3/uL (ref 0.0–0.1)
EOS ABS: 0.2 10*3/uL (ref 0.0–0.7)
Eosinophils Relative: 2 %
HEMATOCRIT: 37.6 % (ref 36.0–46.0)
Hemoglobin: 12.2 g/dL (ref 12.0–15.0)
LYMPHS ABS: 2.2 10*3/uL (ref 0.7–4.0)
Lymphocytes Relative: 34 %
MCH: 31.3 pg (ref 26.0–34.0)
MCHC: 32.4 g/dL (ref 30.0–36.0)
MCV: 96.4 fL (ref 78.0–100.0)
MONO ABS: 0.5 10*3/uL (ref 0.1–1.0)
MONOS PCT: 8 %
NEUTROS ABS: 3.7 10*3/uL (ref 1.7–7.7)
Neutrophils Relative %: 56 %
Platelets: 281 10*3/uL (ref 150–400)
RBC: 3.9 MIL/uL (ref 3.87–5.11)
RDW: 14.9 % (ref 11.5–15.5)
WBC: 6.6 10*3/uL (ref 4.0–10.5)

## 2017-04-07 LAB — COMPREHENSIVE METABOLIC PANEL
ALBUMIN: 4.2 g/dL (ref 3.5–5.0)
ALT: 14 U/L (ref 14–54)
ANION GAP: 8 (ref 5–15)
AST: 25 U/L (ref 15–41)
Alkaline Phosphatase: 65 U/L (ref 38–126)
BILIRUBIN TOTAL: 0.4 mg/dL (ref 0.3–1.2)
BUN: 15 mg/dL (ref 6–20)
CALCIUM: 9.3 mg/dL (ref 8.9–10.3)
CO2: 26 mmol/L (ref 22–32)
Chloride: 103 mmol/L (ref 101–111)
Creatinine, Ser: 0.56 mg/dL (ref 0.44–1.00)
GFR calc non Af Amer: >60 mL/min (ref >60)
GLUCOSE: 97 mg/dL (ref 65–99)
POTASSIUM: 4 mmol/L (ref 3.5–5.1)
Sodium: 137 mmol/L (ref 135–145)
TOTAL PROTEIN: 7.6 g/dL (ref 6.5–8.1)

## 2017-04-07 LAB — URINALYSIS, ROUTINE W REFLEX MICROSCOPIC
BILIRUBIN URINE: NEGATIVE
Glucose, UA: NEGATIVE mg/dL
Hgb urine dipstick: NEGATIVE
KETONES UR: NEGATIVE mg/dL
NITRITE: NEGATIVE
PROTEIN: NEGATIVE mg/dL
Specific Gravity, Urine: 1.01 (ref 1.005–1.030)
pH: 7 (ref 5.0–8.0)

## 2017-04-07 LAB — APTT: aPTT: 31 seconds (ref 24–36)

## 2017-04-07 LAB — PROTIME-INR
INR: 1.05
PROTHROMBIN TIME: 13.6 s (ref 11.4–15.2)

## 2017-04-07 LAB — SURGICAL PCR SCREEN
MRSA, PCR: INVALID — AB
Staphylococcus aureus: INVALID — AB

## 2017-04-07 LAB — ABO/RH: ABO/RH(D): O POS

## 2017-04-08 NOTE — Anesthesia Preprocedure Evaluation (Signed)
Anesthesia Evaluation  Patient identified by MRN, date of birth, ID band Patient awake    Reviewed: Allergy & Precautions, NPO status , Patient's Chart, lab work & pertinent test results  Airway Mallampati: III  TM Distance: >3 FB Neck ROM: Full    Dental  (+) Poor Dentition, Missing   Pulmonary pneumonia, resolved, former smoker,    Pulmonary exam normal breath sounds clear to auscultation       Cardiovascular hypertension, Pt. on medications Normal cardiovascular exam+ Valvular Problems/Murmurs  Rhythm:Regular Rate:Normal     Neuro/Psych PSYCHIATRIC DISORDERS Anxiety Depression    GI/Hepatic Neg liver ROS, GERD  Medicated and Controlled,  Endo/Other  Morbid obesityHyperlipidemia   Renal/GU negative Renal ROS  negative genitourinary   Musculoskeletal  (+) Arthritis , Osteoarthritis,  OA Both hips    Abdominal (+) + obese,   Peds  Hematology  (+) anemia ,   Anesthesia Other Findings   Reproductive/Obstetrics                             Anesthesia Physical  Anesthesia Plan  ASA: III  Anesthesia Plan: Spinal   Post-op Pain Management:    Induction:   PONV Risk Score and Plan: 2 and Dexamethasone and Ondansetron  Airway Management Planned: Natural Airway and Simple Face Mask  Additional Equipment:   Intra-op Plan:   Post-operative Plan:   Informed Consent: I have reviewed the patients History and Physical, chart, labs and discussed the procedure including the risks, benefits and alternatives for the proposed anesthesia with the patient or authorized representative who has indicated his/her understanding and acceptance.   Dental advisory given  Plan Discussed with: Anesthesiologist, CRNA and Surgeon  Anesthesia Plan Comments:         Anesthesia Quick Evaluation

## 2017-04-09 ENCOUNTER — Inpatient Hospital Stay (HOSPITAL_COMMUNITY): Payer: MEDICARE | Admitting: Anesthesiology

## 2017-04-09 ENCOUNTER — Encounter (HOSPITAL_COMMUNITY): Payer: Self-pay | Admitting: Emergency Medicine

## 2017-04-09 ENCOUNTER — Inpatient Hospital Stay (HOSPITAL_COMMUNITY)
Admission: RE | Admit: 2017-04-09 | Discharge: 2017-04-12 | DRG: 470 | Disposition: A | Discharge status: Skilled Nursing Facility | Payer: MEDICARE | Source: Ambulatory Visit | Attending: Orthopedic Surgery | Admitting: Orthopedic Surgery

## 2017-04-09 ENCOUNTER — Encounter (HOSPITAL_COMMUNITY)
Admission: RE | Disposition: A | Discharge status: Skilled Nursing Facility | Payer: Self-pay | Source: Ambulatory Visit | Attending: Orthopedic Surgery

## 2017-04-09 ENCOUNTER — Inpatient Hospital Stay (HOSPITAL_COMMUNITY): Payer: MEDICARE

## 2017-04-09 ENCOUNTER — Other Ambulatory Visit: Payer: Self-pay

## 2017-04-09 DIAGNOSIS — I10 Essential (primary) hypertension: Secondary | ICD-10-CM | POA: Diagnosis present

## 2017-04-09 DIAGNOSIS — I700 Atherosclerosis of aorta: Secondary | ICD-10-CM | POA: Diagnosis present

## 2017-04-09 DIAGNOSIS — K219 Gastro-esophageal reflux disease without esophagitis: Secondary | ICD-10-CM | POA: Diagnosis present

## 2017-04-09 DIAGNOSIS — M1612 Unilateral primary osteoarthritis, left hip: Secondary | ICD-10-CM | POA: Diagnosis not present

## 2017-04-09 DIAGNOSIS — Z9841 Cataract extraction status, right eye: Secondary | ICD-10-CM | POA: Diagnosis not present

## 2017-04-09 DIAGNOSIS — Z96652 Presence of left artificial knee joint: Secondary | ICD-10-CM | POA: Diagnosis not present

## 2017-04-09 DIAGNOSIS — Z4789 Encounter for other orthopedic aftercare: Secondary | ICD-10-CM | POA: Diagnosis not present

## 2017-04-09 DIAGNOSIS — Z9842 Cataract extraction status, left eye: Secondary | ICD-10-CM

## 2017-04-09 DIAGNOSIS — F419 Anxiety disorder, unspecified: Secondary | ICD-10-CM | POA: Diagnosis not present

## 2017-04-09 DIAGNOSIS — F329 Major depressive disorder, single episode, unspecified: Secondary | ICD-10-CM | POA: Diagnosis not present

## 2017-04-09 DIAGNOSIS — I2510 Atherosclerotic heart disease of native coronary artery without angina pectoris: Secondary | ICD-10-CM | POA: Diagnosis not present

## 2017-04-09 DIAGNOSIS — Z79899 Other long term (current) drug therapy: Secondary | ICD-10-CM | POA: Diagnosis not present

## 2017-04-09 DIAGNOSIS — R2689 Other abnormalities of gait and mobility: Secondary | ICD-10-CM | POA: Diagnosis not present

## 2017-04-09 DIAGNOSIS — M6281 Muscle weakness (generalized): Secondary | ICD-10-CM | POA: Diagnosis not present

## 2017-04-09 DIAGNOSIS — M1990 Unspecified osteoarthritis, unspecified site: Secondary | ICD-10-CM | POA: Diagnosis not present

## 2017-04-09 DIAGNOSIS — Z6835 Body mass index (BMI) 35.0-35.9, adult: Secondary | ICD-10-CM

## 2017-04-09 DIAGNOSIS — Z96642 Presence of left artificial hip joint: Secondary | ICD-10-CM | POA: Diagnosis not present

## 2017-04-09 DIAGNOSIS — D649 Anemia, unspecified: Secondary | ICD-10-CM | POA: Diagnosis not present

## 2017-04-09 DIAGNOSIS — E785 Hyperlipidemia, unspecified: Secondary | ICD-10-CM | POA: Diagnosis not present

## 2017-04-09 DIAGNOSIS — Z96612 Presence of left artificial shoulder joint: Secondary | ICD-10-CM | POA: Diagnosis present

## 2017-04-09 DIAGNOSIS — M160 Bilateral primary osteoarthritis of hip: Secondary | ICD-10-CM | POA: Diagnosis not present

## 2017-04-09 DIAGNOSIS — Z87891 Personal history of nicotine dependence: Secondary | ICD-10-CM | POA: Diagnosis not present

## 2017-04-09 DIAGNOSIS — Z96641 Presence of right artificial hip joint: Secondary | ICD-10-CM | POA: Diagnosis present

## 2017-04-09 DIAGNOSIS — Z96643 Presence of artificial hip joint, bilateral: Secondary | ICD-10-CM | POA: Diagnosis not present

## 2017-04-09 DIAGNOSIS — Z419 Encounter for procedure for purposes other than remedying health state, unspecified: Secondary | ICD-10-CM

## 2017-04-09 DIAGNOSIS — K625 Hemorrhage of anus and rectum: Secondary | ICD-10-CM | POA: Diagnosis not present

## 2017-04-09 DIAGNOSIS — M25552 Pain in left hip: Secondary | ICD-10-CM | POA: Diagnosis not present

## 2017-04-09 DIAGNOSIS — M129 Arthropathy, unspecified: Secondary | ICD-10-CM | POA: Diagnosis not present

## 2017-04-09 HISTORY — PX: TOTAL HIP ARTHROPLASTY: SHX124

## 2017-04-09 LAB — TYPE AND SCREEN
ABO/RH(D): O POS
ANTIBODY SCREEN: NEGATIVE

## 2017-04-09 SURGERY — ARTHROPLASTY, HIP, TOTAL, ANTERIOR APPROACH
Anesthesia: Spinal | Site: Hip | Laterality: Left

## 2017-04-09 MED ORDER — ACETAMINOPHEN 650 MG RE SUPP: 650.0000 mg | RECTAL | Status: DC | PRN

## 2017-04-09 MED ORDER — GLYCOPYRROLATE 0.2 MG/ML IJ SOLN
INTRAMUSCULAR | Status: DC | PRN
  Administered 2017-04-09: 0.1 mg via INTRAVENOUS

## 2017-04-09 MED ORDER — MIDAZOLAM HCL 2 MG/2ML IJ SOLN
INTRAMUSCULAR | Status: AC
  Filled 2017-04-09: qty 2

## 2017-04-09 MED ORDER — BISACODYL 5 MG PO TBEC
5.0000 mg | DELAYED_RELEASE_TABLET | Freq: Every day | ORAL | Status: DC | PRN
  Administered 2017-04-12: 5 mg via ORAL
  Filled 2017-04-09: qty 1

## 2017-04-09 MED ORDER — HYDROMORPHONE HCL 1 MG/ML IJ SOLN
INTRAMUSCULAR | Status: AC
  Administered 2017-04-09: 0.5 mg via INTRAVENOUS
  Filled 2017-04-09: qty 1

## 2017-04-09 MED ORDER — FENTANYL CITRATE (PF) 100 MCG/2ML IJ SOLN: 25.0000 ug | INTRAMUSCULAR | Status: DC | PRN

## 2017-04-09 MED ORDER — SODIUM CHLORIDE 0.9 % IJ SOLN
INTRAMUSCULAR | Status: AC
  Filled 2017-04-09: qty 50

## 2017-04-09 MED ORDER — FENTANYL CITRATE (PF) 100 MCG/2ML IJ SOLN
INTRAMUSCULAR | Status: DC | PRN
  Administered 2017-04-09: 25 ug via INTRAVENOUS

## 2017-04-09 MED ORDER — ACETAMINOPHEN 10 MG/ML IV SOLN
INTRAVENOUS | Status: AC
  Filled 2017-04-09: qty 100

## 2017-04-09 MED ORDER — ALUM & MAG HYDROXIDE-SIMETH 200-200-20 MG/5ML PO SUSP: 30.0000 mL | ORAL | Status: DC | PRN

## 2017-04-09 MED ORDER — GABAPENTIN 300 MG PO CAPS
300.0000 mg | ORAL_CAPSULE | Freq: Two times a day (BID) | ORAL | Status: DC
  Administered 2017-04-09 – 2017-04-12 (×7): 300 mg via ORAL
  Filled 2017-04-09 (×7): qty 1

## 2017-04-09 MED ORDER — HYDROMORPHONE HCL 1 MG/ML IJ SOLN
0.5000 mg | INTRAMUSCULAR | Status: DC | PRN
  Administered 2017-04-09 – 2017-04-10 (×3): 0.5 mg via INTRAVENOUS
  Filled 2017-04-09 (×2): qty 0.5

## 2017-04-09 MED ORDER — POLYETHYLENE GLYCOL 3350 17 G PO PACK
17.0000 g | PACK | Freq: Every day | ORAL | Status: DC | PRN
  Administered 2017-04-12: 17 g via ORAL
  Filled 2017-04-09: qty 1

## 2017-04-09 MED ORDER — OXYCODONE HCL 5 MG PO TABS
5.0000 mg | ORAL_TABLET | ORAL | Status: DC | PRN
  Administered 2017-04-09: 5 mg via ORAL
  Administered 2017-04-09 – 2017-04-10 (×3): 10 mg via ORAL
  Administered 2017-04-10 – 2017-04-11 (×5): 5 mg via ORAL
  Administered 2017-04-12 (×2): 10 mg via ORAL
  Filled 2017-04-09: qty 1
  Filled 2017-04-09 (×2): qty 2
  Filled 2017-04-09 (×4): qty 1
  Filled 2017-04-09: qty 2
  Filled 2017-04-09: qty 1
  Filled 2017-04-09 (×3): qty 2

## 2017-04-09 MED ORDER — 0.9 % SODIUM CHLORIDE (POUR BTL) OPTIME
TOPICAL | Status: DC | PRN
  Administered 2017-04-09: 1000 mL

## 2017-04-09 MED ORDER — PROPOFOL 500 MG/50ML IV EMUL
INTRAVENOUS | Status: DC | PRN
  Administered 2017-04-09: 75 ug/kg/min via INTRAVENOUS

## 2017-04-09 MED ORDER — OXYCODONE-ACETAMINOPHEN 5-325 MG PO TABS: 1.0000 | ORAL_TABLET | Freq: Four times a day (QID) | ORAL | 0 refills | Status: DC | PRN

## 2017-04-09 MED ORDER — CHLORHEXIDINE GLUCONATE 4 % EX LIQD: 60.0000 mL | Freq: Once | CUTANEOUS | Status: DC

## 2017-04-09 MED ORDER — CEFAZOLIN SODIUM-DEXTROSE 2-4 GM/100ML-% IV SOLN
2.0000 g | INTRAVENOUS | Status: AC
  Administered 2017-04-09: 2 g via INTRAVENOUS

## 2017-04-09 MED ORDER — CEFAZOLIN SODIUM-DEXTROSE 2-4 GM/100ML-% IV SOLN
INTRAVENOUS | Status: AC
  Administered 2017-04-10: 2 g via INTRAVENOUS
  Filled 2017-04-09: qty 100

## 2017-04-09 MED ORDER — FENTANYL CITRATE (PF) 100 MCG/2ML IJ SOLN
INTRAMUSCULAR | Status: AC
  Filled 2017-04-09: qty 2

## 2017-04-09 MED ORDER — PROPOFOL 10 MG/ML IV BOLUS
INTRAVENOUS | Status: AC
  Filled 2017-04-09: qty 60

## 2017-04-09 MED ORDER — LORAZEPAM 0.5 MG PO TABS
0.5000 mg | ORAL_TABLET | Freq: Every evening | ORAL | Status: DC | PRN
  Administered 2017-04-10: 0.5 mg via ORAL
  Filled 2017-04-09: qty 1

## 2017-04-09 MED ORDER — GLYCOPYRROLATE 0.2 MG/ML IV SOSY
PREFILLED_SYRINGE | INTRAVENOUS | Status: AC
  Filled 2017-04-09: qty 3

## 2017-04-09 MED ORDER — ONDANSETRON HCL 4 MG/2ML IJ SOLN
INTRAMUSCULAR | Status: AC
  Filled 2017-04-09: qty 2

## 2017-04-09 MED ORDER — CEFAZOLIN SODIUM-DEXTROSE 2-4 GM/100ML-% IV SOLN: 2.0000 g | Freq: Three times a day (TID) | INTRAVENOUS | Status: DC

## 2017-04-09 MED ORDER — BUPIVACAINE HCL (PF) 0.25 % IJ SOLN
INTRAMUSCULAR | Status: DC | PRN
  Administered 2017-04-09: 30 mL

## 2017-04-09 MED ORDER — LISINOPRIL 20 MG PO TABS
20.0000 mg | ORAL_TABLET | Freq: Every day | ORAL | Status: DC
  Administered 2017-04-11: 20 mg via ORAL
  Filled 2017-04-09 (×3): qty 1

## 2017-04-09 MED ORDER — STERILE WATER FOR IRRIGATION IR SOLN
Status: DC | PRN
  Administered 2017-04-09: 2000 mL

## 2017-04-09 MED ORDER — TIZANIDINE HCL 2 MG PO TABS: 2.0000 mg | ORAL_TABLET | Freq: Three times a day (TID) | ORAL | 0 refills | Status: DC | PRN

## 2017-04-09 MED ORDER — MAGNESIUM CITRATE PO SOLN: 1.0000 | Freq: Once | ORAL | Status: DC | PRN

## 2017-04-09 MED ORDER — ACETAMINOPHEN 325 MG PO TABS
650.0000 mg | ORAL_TABLET | ORAL | Status: DC | PRN
  Filled 2017-04-09: qty 2

## 2017-04-09 MED ORDER — LIDOCAINE HCL (CARDIAC) 20 MG/ML IV SOLN
INTRAVENOUS | Status: DC | PRN
  Administered 2017-04-09: 50 mg via INTRAVENOUS

## 2017-04-09 MED ORDER — BUPIVACAINE LIPOSOME 1.3 % IJ SUSP
20.0000 mL | Freq: Once | INTRAMUSCULAR | Status: DC
  Filled 2017-04-09: qty 20

## 2017-04-09 MED ORDER — ONDANSETRON HCL 4 MG PO TABS: 4.0000 mg | ORAL_TABLET | Freq: Four times a day (QID) | ORAL | Status: DC | PRN

## 2017-04-09 MED ORDER — ACETAMINOPHEN 10 MG/ML IV SOLN
INTRAVENOUS | Status: DC | PRN
  Administered 2017-04-09: 1000 mg via INTRAVENOUS

## 2017-04-09 MED ORDER — PRAVASTATIN SODIUM 20 MG PO TABS
40.0000 mg | ORAL_TABLET | Freq: Every day | ORAL | Status: DC
  Administered 2017-04-10 – 2017-04-12 (×3): 40 mg via ORAL
  Filled 2017-04-09 (×3): qty 2

## 2017-04-09 MED ORDER — TRANEXAMIC ACID 1000 MG/10ML IV SOLN
1000.0000 mg | Freq: Once | INTRAVENOUS | Status: AC
  Administered 2017-04-09: 1000 mg via INTRAVENOUS
  Filled 2017-04-09: qty 1100

## 2017-04-09 MED ORDER — ASPIRIN EC 325 MG PO TBEC
325.0000 mg | DELAYED_RELEASE_TABLET | Freq: Two times a day (BID) | ORAL | Status: DC
  Administered 2017-04-09 – 2017-04-12 (×6): 325 mg via ORAL
  Filled 2017-04-09 (×6): qty 1

## 2017-04-09 MED ORDER — DEXTROSE 5 % IV SOLN
500.0000 mg | Freq: Four times a day (QID) | INTRAVENOUS | Status: DC | PRN
  Administered 2017-04-09: 500 mg via INTRAVENOUS
  Filled 2017-04-09: qty 550

## 2017-04-09 MED ORDER — PRAVASTATIN SODIUM 20 MG PO TABS: 40.0000 mg | ORAL_TABLET | Freq: Two times a day (BID) | ORAL | Status: DC

## 2017-04-09 MED ORDER — LIDOCAINE 2% (20 MG/ML) 5 ML SYRINGE
INTRAMUSCULAR | Status: AC
  Filled 2017-04-09: qty 5

## 2017-04-09 MED ORDER — DEXAMETHASONE SODIUM PHOSPHATE 10 MG/ML IJ SOLN
10.0000 mg | Freq: Two times a day (BID) | INTRAMUSCULAR | Status: AC
  Administered 2017-04-09 – 2017-04-10 (×3): 10 mg via INTRAVENOUS
  Filled 2017-04-09 (×3): qty 1

## 2017-04-09 MED ORDER — CEFAZOLIN SODIUM-DEXTROSE 2-4 GM/100ML-% IV SOLN
2.0000 g | Freq: Four times a day (QID) | INTRAVENOUS | Status: AC
  Administered 2017-04-09 – 2017-04-10 (×3): 2 g via INTRAVENOUS
  Filled 2017-04-09 (×3): qty 100

## 2017-04-09 MED ORDER — ONDANSETRON HCL 4 MG/2ML IJ SOLN: 4.0000 mg | Freq: Four times a day (QID) | INTRAMUSCULAR | Status: DC | PRN

## 2017-04-09 MED ORDER — SODIUM CHLORIDE 0.9 % IV SOLN
INTRAVENOUS | Status: DC
  Administered 2017-04-09 – 2017-04-10 (×2): via INTRAVENOUS

## 2017-04-09 MED ORDER — ONDANSETRON HCL 4 MG/2ML IJ SOLN
INTRAMUSCULAR | Status: DC | PRN
  Administered 2017-04-09: 4 mg via INTRAVENOUS

## 2017-04-09 MED ORDER — FERROUS SULFATE 325 (65 FE) MG PO TABS
325.0000 mg | ORAL_TABLET | Freq: Every day | ORAL | Status: DC
Start: 2017-04-10 — End: 2017-04-12
  Administered 2017-04-10 – 2017-04-12 (×3): 325 mg via ORAL
  Filled 2017-04-09 (×3): qty 1

## 2017-04-09 MED ORDER — METHOCARBAMOL 500 MG PO TABS
500.0000 mg | ORAL_TABLET | Freq: Four times a day (QID) | ORAL | Status: DC | PRN
Start: 2017-04-09 — End: 2017-04-12
  Administered 2017-04-09 – 2017-04-10 (×3): 500 mg via ORAL
  Filled 2017-04-09 (×3): qty 1

## 2017-04-09 MED ORDER — PHENYLEPHRINE HCL 10 MG/ML IJ SOLN
INTRAVENOUS | Status: DC | PRN
  Administered 2017-04-09: 30 ug/min via INTRAVENOUS

## 2017-04-09 MED ORDER — BUPIVACAINE-EPINEPHRINE (PF) 0.5% -1:200000 IJ SOLN
INTRAMUSCULAR | Status: AC
  Filled 2017-04-09: qty 30

## 2017-04-09 MED ORDER — DEXAMETHASONE SODIUM PHOSPHATE 10 MG/ML IJ SOLN
INTRAMUSCULAR | Status: AC
  Filled 2017-04-09: qty 1

## 2017-04-09 MED ORDER — MIDAZOLAM HCL 5 MG/5ML IJ SOLN
INTRAMUSCULAR | Status: DC | PRN
  Administered 2017-04-09: 1 mg via INTRAVENOUS

## 2017-04-09 MED ORDER — DOCUSATE SODIUM 100 MG PO CAPS
100.0000 mg | ORAL_CAPSULE | Freq: Two times a day (BID) | ORAL | Status: DC
  Administered 2017-04-09 – 2017-04-12 (×6): 100 mg via ORAL
  Filled 2017-04-09 (×6): qty 1

## 2017-04-09 MED ORDER — TRANEXAMIC ACID 1000 MG/10ML IV SOLN
1000.0000 mg | INTRAVENOUS | Status: AC
  Administered 2017-04-09: 1000 mg via INTRAVENOUS
  Filled 2017-04-09: qty 10
  Filled 2017-04-09: qty 1100

## 2017-04-09 MED ORDER — LACTATED RINGERS IV SOLN
INTRAVENOUS | Status: DC | PRN
  Administered 2017-04-09 (×2): via INTRAVENOUS

## 2017-04-09 MED ORDER — DIPHENHYDRAMINE HCL 12.5 MG/5ML PO ELIX: 12.5000 mg | ORAL_SOLUTION | ORAL | Status: DC | PRN

## 2017-04-09 MED ORDER — BUPIVACAINE LIPOSOME 1.3 % IJ SUSP
INTRAMUSCULAR | Status: DC | PRN
  Administered 2017-04-09: 20 mL

## 2017-04-09 MED ORDER — DEXAMETHASONE SODIUM PHOSPHATE 10 MG/ML IJ SOLN
INTRAMUSCULAR | Status: DC | PRN
  Administered 2017-04-09: 10 mg via INTRAVENOUS

## 2017-04-09 SURGICAL SUPPLY — 42 items
APL SKNCLS STERI-STRIP NONHPOA (GAUZE/BANDAGES/DRESSINGS)
BAG SPEC THK2 15X12 ZIP CLS (MISCELLANEOUS) ×1
BAG ZIPLOCK 12X15 (MISCELLANEOUS) ×2 IMPLANT
BENZOIN TINCTURE PRP APPL 2/3 (GAUZE/BANDAGES/DRESSINGS) IMPLANT
BLADE SAW SGTL 18X1.27X75 (BLADE) ×2 IMPLANT
BNDG COHESIVE 6X5 TAN STRL LF (GAUZE/BANDAGES/DRESSINGS) IMPLANT
CAPT HIP TOTAL 2 ×1 IMPLANT
CELLS DAT CNTRL 66122 CELL SVR (MISCELLANEOUS) ×1 IMPLANT
COVER PERINEAL POST (MISCELLANEOUS) ×2 IMPLANT
COVER SURGICAL LIGHT HANDLE (MISCELLANEOUS) ×2 IMPLANT
DRAPE STERI IOBAN 125X83 (DRAPES) ×2 IMPLANT
DRAPE U-SHAPE 47X51 STRL (DRAPES) ×4 IMPLANT
DRESSING AQUACEL AG SP 3.5X10 (GAUZE/BANDAGES/DRESSINGS) IMPLANT
DRSG AQUACEL AG ADV 3.5X10 (GAUZE/BANDAGES/DRESSINGS) ×2 IMPLANT
DRSG AQUACEL AG SP 3.5X10 (GAUZE/BANDAGES/DRESSINGS) ×2
DURAPREP 26ML APPLICATOR (WOUND CARE) ×2 IMPLANT
ELECT REM PT RETURN 15FT ADLT (MISCELLANEOUS) ×2 IMPLANT
GAUZE XEROFORM 1X8 LF (GAUZE/BANDAGES/DRESSINGS) ×2 IMPLANT
GLOVE BIOGEL PI IND STRL 7.0 (GLOVE) IMPLANT
GLOVE BIOGEL PI IND STRL 7.5 (GLOVE) IMPLANT
GLOVE BIOGEL PI IND STRL 8 (GLOVE) ×2 IMPLANT
GLOVE BIOGEL PI INDICATOR 7.0 (GLOVE) ×1
GLOVE BIOGEL PI INDICATOR 7.5 (GLOVE) ×2
GLOVE BIOGEL PI INDICATOR 8 (GLOVE) ×3
GLOVE ECLIPSE 7.0 STRL STRAW (GLOVE) ×1 IMPLANT
GLOVE ECLIPSE 7.5 STRL STRAW (GLOVE) ×4 IMPLANT
GLOVE SURG SS PI 8.0 STRL IVOR (GLOVE) ×1 IMPLANT
GOWN STRL REUS W/TWL XL LVL3 (GOWN DISPOSABLE) ×6 IMPLANT
HOLDER FOLEY CATH W/STRAP (MISCELLANEOUS) ×2 IMPLANT
HOOD PEEL AWAY FLYTE STAYCOOL (MISCELLANEOUS) ×4 IMPLANT
PACK ANTERIOR HIP CUSTOM (KITS) ×2 IMPLANT
RETRACTOR WND ALEXIS 18 MED (MISCELLANEOUS) ×1 IMPLANT
RTRCTR WOUND ALEXIS 18CM MED (MISCELLANEOUS) ×2
STAPLER VISISTAT 35W (STAPLE) ×2 IMPLANT
STRIP CLOSURE SKIN 1/2X4 (GAUZE/BANDAGES/DRESSINGS) IMPLANT
SUT ETHIBOND NAB CT1 #1 30IN (SUTURE) ×4 IMPLANT
SUT VIC AB 0 CT1 36 (SUTURE) ×4 IMPLANT
SUT VIC AB 1 CT1 36 (SUTURE) ×4 IMPLANT
SUT VIC AB 2-0 CT1 27 (SUTURE) ×4
SUT VIC AB 2-0 CT1 TAPERPNT 27 (SUTURE) ×2 IMPLANT
TRAY FOLEY CATH SILVER 14FR (SET/KITS/TRAYS/PACK) ×1 IMPLANT
YANKAUER SUCT BULB TIP NO VENT (SUCTIONS) ×2 IMPLANT

## 2017-04-09 NOTE — Evaluation (Signed)
Physical Therapy Evaluation Patient Details Name: [name] MRN: [id number] DOB: Aug 23, 1940 Today's Date: 04/09/2017   History of Present Illness  Pt is a 76 y/o female s/p elective L THA, direct anterior approach. PMH includes, HTN, depression, and thoracic aortic atherosclerosis.   Clinical Impression  Pt is s/p surgery above with deficits below. PTA, pt was using RW for ambulation. Upon eval, pt presenting with post op pain and weakness. Mobility limited to stand pivot to chair using RW secondary to pain. Required min A for steadying. Reports she will be going to SNF prior to return home to address mobility deficits. Will continue to follow acutely to maximize functional mobility independence and safety.     Follow Up Recommendations SNF;Supervision/Assistance - 24 hour    Equipment Recommendations  None recommended by PT    Recommendations for Other Services       Precautions / Restrictions Precautions Precautions: None Restrictions Weight Bearing Restrictions: Yes LLE Weight Bearing: Weight bearing as tolerated      Mobility  Bed Mobility Overal bed mobility: Needs Assistance Bed Mobility: Supine to Sit     Supine to sit: Min assist     General bed mobility comments: Min A for LLE management. Verbal cues for sequencing. Use of bed rails and elevated HOB.   Transfers Overall transfer level: Needs assistance Equipment used: Rolling walker (2 wheeled) Transfers: Sit to/from [employer] CorporationStand;Stand Pivot Transfers Sit to Stand: Min assist Stand pivot transfers: Min assist       General transfer comment: Min A for lift assist and steadying. Verbal cues for safe hand placement. Stand pivot to chair secondary to pain. Required min A for steadying and verbal cues for sequencing using RW.   Ambulation/Gait             General Gait Details: NT secondary to increased pain.   Stairs            Wheelchair Mobility    Modified Rankin (Stroke Patients Only)        Balance Overall balance assessment: Needs assistance Sitting-balance support: No upper extremity supported;Feet supported Sitting balance-Leahy Scale: Good     Standing balance support: Bilateral upper extremity supported;During functional activity Standing balance-Leahy Scale: Poor Standing balance comment: Reliant on BUE support for balance.                              Pertinent Vitals/Pain Pain Assessment: 0-10 Pain Score: 8  Pain Location: L hip  Pain Descriptors / Indicators: Aching;Operative site guarding Pain Intervention(s): Limited activity within patient's tolerance;Monitored during session;Repositioned    Home Living Family/patient expects to be discharged to:: Skilled nursing facility                 Additional Comments: [facility]     Prior Function Level of Independence: Independent with assistive device(s)         Comments: Used RW for ambulation      Hand Dominance   Dominant Hand: Right    Extremity/Trunk Assessment   Upper Extremity Assessment Upper Extremity Assessment: Defer to OT evaluation    Lower Extremity Assessment Lower Extremity Assessment: LLE deficits/detail LLE Deficits / Details: Numbness at incision site. Deficits consistent with post op pain and weakness. Able to perform ther ex below.     Cervical / Trunk Assessment Cervical / Trunk Assessment: Normal  Communication   Communication: No difficulties  Cognition Arousal/Alertness: Awake/alert Behavior During Therapy: WFL for  tasks assessed/performed Overall Cognitive Status: Within Functional Limits for tasks assessed                                        General Comments General comments (skin integrity, edema, etc.): Pt's daughter present during session.     Exercises Total Joint Exercises Ankle Circles/Pumps: AROM;Both;20 reps Quad Sets: AROM;Both;10 reps Short Arc Quad: AROM;Both;10 reps Heel Slides: AROM;Both;10 reps    Assessment/Plan    PT Assessment Patient needs continued PT services  PT Problem List Decreased strength;Decreased range of motion;Decreased activity tolerance;Decreased balance;Decreased mobility;Decreased knowledge of use of DME;Pain       PT Treatment Interventions DME instruction;Gait training;Functional mobility training;Therapeutic activities;Therapeutic exercise;Balance training;Neuromuscular re-education;Patient/family education    PT Goals (Current goals can be found in the Care Plan section)  Acute Rehab PT Goals Patient Stated Goal: to go to rehab to get stronger  PT Goal Formulation: With patient Time For Goal Achievement: 04/23/17 Potential to Achieve Goals: Good    Frequency 7X/week   Barriers to discharge        Co-evaluation               AM-PAC PT "6 Clicks" Daily Activity  Outcome Measure Difficulty turning over in bed (including adjusting bedclothes, sheets and blankets)?: A Little Difficulty moving from lying on back to sitting on the side of the bed? : Unable Difficulty sitting down on and standing up from a chair with arms (e.g., wheelchair, bedside commode, etc,.)?: Unable Help needed moving to and from a bed to chair (including a wheelchair)?: A Little Help needed walking in hospital room?: A Little Help needed climbing 3-5 steps with a railing? : A Lot 6 Click Score: 13    End of Session Equipment Utilized During Treatment: Gait belt Activity Tolerance: Patient limited by pain Patient left: in chair;with call bell/phone within reach;with family/visitor present Nurse Communication: Mobility status PT Visit Diagnosis: Other abnormalities of gait and mobility (R26.89);Pain Pain - Right/Left: Left Pain - part of body: Hip    Time: [id number] PT Time Calculation (min) (ACUTE ONLY): 18 min   Charges:   PT Evaluation $PT Eval Low Complexity: 1 Low     PT G Codes:        Gladys DammeBrittany Amonie Wisser, PT, DPT  Acute Rehabilitation Services   Pager: [phone]   Lehman PromBrittany S Markale Birdsell 04/09/2017, 6:43 PM

## 2017-04-09 NOTE — Discharge Instructions (Signed)

## 2017-04-09 NOTE — Transfer of Care (Signed)
Immediate Anesthesia Transfer of Care Note  Patient: [name]  Procedure(s) Performed: LEFT TOTAL HIP ARTHROPLASTY ANTERIOR APPROACH (Left Hip)  Patient Location: PACU  Anesthesia Type:Spinal  Level of Consciousness: oriented, sedated and patient cooperative  Airway & Oxygen Therapy: Patient Spontanous Breathing and Patient connected to face mask oxygen  Post-op Assessment: Report given to RN and Post -op Vital signs reviewed and stable  Post vital signs: stable  Last Vitals:  Vitals:   04/09/17 0520  BP: (!) 150/96  Pulse: 60  Resp: 16  Temp: 36.6 C  SpO2: 100%    Last Pain:  Vitals:   04/09/17 0520  TempSrc: Oral      Patients Stated Pain Goal: 4 (04/09/17 0542)  Complications: No apparent anesthesia complications

## 2017-04-09 NOTE — Clinical Social Work Placement (Signed)
  CLINICAL SOCIAL WORK PLACEMENT  NOTE  Date:  04/09/2017  Patient Details  Name: [name] MRN: [id number] Date of Birth: 21-Oct-1940  Clinical Social Work is seeking post-discharge placement for this patient at the Skilled  Nursing Facility level of care (*CSW will initial, date and re-position this form in  chart as items are completed):  No   Patient/family provided with [facility] Clinical Social Work Department's list of facilities offering this level of care within the geographic area requested by the patient (or if unable, by the patient's family).  No   Patient/family informed of their freedom to choose among providers that offer the needed level of care, that participate in Medicare, Medicaid or managed care program needed by the patient, have an available bed and are willing to accept the patient.  No   Patient/family informed of [facility]'s ownership interest in [facility] and [facility], as well as of the fact that they are under no obligation to receive care at these facilities.  PASRR submitted to EDS on       PASRR number received on       Existing PASRR number confirmed on 04/09/17     FL2 transmitted to all facilities in geographic area requested by pt/family on       FL2 transmitted to all facilities within larger geographic area on       Patient informed that his/her managed care company has contracts with or will negotiate with certain facilities, including the following:            Patient/family informed of bed offers received.  [facility]     Physician recommends and patient [facility]    [facility]  .  Patient to be transferred to facility by [facility]     Patient family notified on   of transfer.  Name of family member notified:        PHYSICIAN       Additional Comment:    _______________________________________________ Clearance CootsNicole A Rowene Suto,  LCSW 04/09/2017, 12:46 PM

## 2017-04-09 NOTE — Progress Notes (Addendum)
Pt had ekg 01/20/17, current order cancelled for one today due to no chest pain or shortness of breath since last ekg per pt.

## 2017-04-09 NOTE — Anesthesia Procedure Notes (Signed)
Procedure Name: MAC Date/Time: 04/09/2017 7:18 AM Performed by: Lissa Morales, CRNA Pre-anesthesia Checklist: Patient identified, Emergency Drugs available, Suction available, Patient being monitored and Timeout performed Patient Re-evaluated:Patient Re-evaluated prior to induction Oxygen Delivery Method: Simple face mask Placement Confirmation: positive ETCO2 Dental Injury: Teeth and Oropharynx as per pre-operative assessment

## 2017-04-09 NOTE — H&P (Signed)
TOTAL HIP ADMISSION H&P  Patient is admitted for left total hip arthroplasty.  Subjective:  Chief Complaint: left hip pain  HPI: [name], 76 y.o. female, has a history of pain and functional disability in the left hip(s) due to arthritis and patient has failed non-surgical conservative treatments for greater than 12 weeks to include flexibility and strengthening excercises, weight reduction as appropriate and activity modification.  Onset of symptoms was gradual starting 5 years ago with gradually worsening course since that time.The patient noted no past surgery on the left hip(s).  Patient currently rates pain in the left hip at 10 out of 10 with activity. Patient has night pain, worsening of pain with activity and weight bearing, trendelenberg gait, pain that interfers with activities of daily living, pain with passive range of motion, crepitus and joint swelling. Patient has evidence of subchondral cysts, subchondral sclerosis, periarticular osteophytes, joint subluxation and joint space narrowing by imaging studies. This condition presents safety issues increasing the risk of falls. This patient has had failure of all reasonable conservative care.  There is no current active infection.  Patient Active Problem List   Diagnosis Date Noted  . Primary osteoarthritis of left hip 01/20/2017  . Primary osteoarthritis of right hip 11/10/2015  . Anxiety state 11/10/2015  . Preoperative cardiovascular examination 07/23/2014  . Class 2 severe obesity due to excess calories with serious comorbidity and body mass index (BMI) of 36.0 to 36.9 in adult [facility]) 07/18/2013  . Bilateral edema of lower extremity 07/18/2013    Class: Chronic  . Metabolic syndrome 62/22/9798  . Rectal bleeding 07/07/2012  . Essential hypertension 07/07/2012  . GERD (gastroesophageal reflux disease) 07/07/2012  . Hyperlipidemia with target LDL less than 100 07/07/2012  . Rotator cuff tear, left 03/02/2012  .  Osteoarthritis of left shoulder 01/21/2012   Past Medical History:  Diagnosis Date  . Anemia    on iron  . Anxiety   . Arthritis    shoulders, knees, hands, hips; status post left knee surgery  . Chronic bronchitis ([place])    CXR 07/08/16  . Constipation   . Depression   . GERD (gastroesophageal reflux disease)    TAKES TUMS  . H/O: pneumonia 2003  . Heart murmur    ???   YEARS AGO; denies    . Hyperlipidemia    on med  . Hypertension   . Morbid obesity with BMI of 45.0-49.9, adult ([place])   . Pneumonia    15-47YRS  . Shingles    recent 02/2017  . Thoracic aortic atherosclerosis ([place])    CXR 07/08/16    Past Surgical History:  Procedure Laterality Date  . CARDIAC CATHETERIZATION  04/20/1996   patent coronaries, EF 74% (Dr. Domenic Moras)  . CARPAL TUNNEL RELEASE Left   . CATARACT EXTRACTION, BILATERAL    . COLONOSCOPY    . KNEE SURGERY Left 11/2001  . REPLACEMENT TOTAL KNEE Left 10/2009  . REPLACEMENT TOTAL KNEE Left 10/2009  . SHOULDER OPEN ROTATOR CUFF REPAIR  03/01/2012   Procedure: ROTATOR CUFF REPAIR SHOULDER OPEN;  Surgeon: Nita Sells, MD;  Location: [facility];  Service: Orthopedics;  Laterality: Left;  SUBSCAPULARIS REPAIR  . STRESS TEST - ADENOSINE THALLIUM  1997   apical and inferior segments with questionable infarct and ischemia in anteroseptal & inferoseptal segments (Dr. Aileen Fass)  . SVD     x 7  . TOTAL HIP ARTHROPLASTY Right 07/24/2016  . TOTAL HIP ARTHROPLASTY Right 07/24/2016   Procedure: RIGHT  TOTAL HIP ARTHROPLASTY ANTERIOR APPROACH;  Surgeon:  , MD;  Location: [facility];  Service: Orthopedics;  Laterality: Right;  . TOTAL SHOULDER ARTHROPLASTY  01/18/2012   Procedure: TOTAL SHOULDER ARTHROPLASTY;  Surgeon: Justin William Chandler, MD;  Location: [facility] OR;  Service: Orthopedics;  Laterality: Left;  . TOTAL SHOULDER REPLACEMENT Left 01/19/2012  . TRANSTHORACIC ECHOCARDIOGRAM  06/2011   LV mildly dilated, EF=>55%; mild mitral annular calcif, mild MR;  mild TR; AV mildly sclerotic, mild aortic regurg  . TUBAL LIGATION    . WISDOM TOOTH EXTRACTION      Current Facility-Administered Medications  Medication Dose Route Frequency Provider Last Rate Last Dose  . bupivacaine liposome (EXPAREL) 1.3 % injection 266 mg  20 mL Infiltration Once , , MD      . ceFAZolin (ANCEF) 2-4 GM/100ML-% IVPB           . ceFAZolin (ANCEF) IVPB 2g/100 mL premix  2 g Intravenous On Call to OR , , MD      . chlorhexidine (HIBICLENS) 4 % liquid 4 application  60 mL Topical Once , , MD      . tranexamic acid (CYKLOKAPRON) 1,000 mg in sodium chloride 0.9 % 100 mL IVPB  1,000 mg Intravenous To OR , , MD       Allergies  Allergen Reactions  . Effexor [Venlafaxine Hcl] Swelling and Other (See Comments)    MD ADVISES PATIENT TO NOT TAKE IN FUTURE PT STOPPED, MD DC'd MED  . Sulfa Drugs Cross Reactors Itching    Social History   Tobacco Use  . Smoking status: Former Smoker    Packs/day: 1.00    Years: 25.00    Pack years: 25.00    Types: Cigarettes    Last attempt to quit: 03/07/1984    Years since quitting: 33.1  . Smokeless tobacco: Former User  Substance Use Topics  . Alcohol use: No    Family History  Problem Relation Age of Onset  . Heart disease Mother   . Heart disease Father   . Heart disease Sister   . Hypertension Sister   . Heart disease Sister   . Hypertension Sister   . Heart disease Sister   . Hypertension Sister      ROS ROS: I have reviewed the patient's review of systems thoroughly and there are no positive responses as relates to the HPI. Objective:  Physical Exam  Vital signs in last 24 hours: Temp:  [97.8 F (36.6 C)] 97.8 F (36.6 C) (12/28 0520) Pulse Rate:  [60] 60 (12/28 0520) Resp:  [16] 16 (12/28 0520) BP: (150)/(96) 150/96 (12/28 0520) SpO2:  [100 %] 100 % (12/28 0520) Weight:  [82.6 kg (182 lb)] 82.6 kg (182 lb) (12/28 0542) Well-developed well-nourished patient in no acute  distress. Alert and oriented x3 HEENT:within normal limits Cardiac: Regular rate and rhythm Pulmonary: Lungs clear to auscultation Abdomen: Soft and nontender.  Normal active bowel sounds  Musculoskeletal: Left hip: Limited range of motion.  Painful range of motion.  Neurovascular intact distally. Labs: Recent Results (from the past 2160 hour(s))  Comprehensive metabolic panel     Status: None   Collection Time: 01/20/17 12:26 PM  Result Value Ref Range   Glucose 89 65 - 99 mg/dL   BUN 14 8 - 27 mg/dL   Creatinine, Ser 0.64 0.57 - 1.00 mg/dL   GFR calc non Af Amer 88 >59 mL/min/1.73   GFR calc Af Amer 102 >59 mL/min/1.73   BUN/Creatinine Ratio 22   12 - 28   Sodium 144 134 - 144 mmol/L   Potassium 4.9 3.5 - 5.2 mmol/L   Chloride 105 96 - 106 mmol/L   CO2 24 20 - 29 mmol/L   Calcium 10.2 8.7 - 10.3 mg/dL   Total Protein 7.5 6.0 - 8.5 g/dL   Albumin 4.6 3.5 - 4.8 g/dL   Globulin, Total 2.9 1.5 - 4.5 g/dL   Albumin/Globulin Ratio 1.6 1.2 - 2.2   Bilirubin Total 0.4 0.0 - 1.2 mg/dL   Alkaline Phosphatase 68 39 - 117 IU/L   AST 18 0 - 40 IU/L   ALT 12 0 - 32 IU/L  Urinalysis, dipstick only     Status: None   Collection Time: 01/20/17 12:26 PM  Result Value Ref Range   Specific Gravity, UA CANCELED     Comment: Test Not Performed.  Patient was unable to void at the time of collection.  The following test(s) were not performed:  Result canceled by the ancillary    pH, UA CANCELED     Comment: Test not performed  Result canceled by the ancillary    Protein, UA CANCELED     Comment: Test not performed  Result canceled by the ancillary    Glucose, UA CANCELED     Comment: Test not performed  Result canceled by the ancillary    Ketones, UA CANCELED     Comment: Test not performed  Result canceled by the ancillary   CBC with Differential/Platelet     Status: Abnormal   Collection Time: 01/20/17 12:26 PM  Result Value Ref Range   WBC 5.6 3.4 - 10.8 x10E3/uL   RBC  4.01 3.77 - 5.28 x10E6/uL   Hemoglobin 12.0 11.1 - 15.9 g/dL   Hematocrit 37.6 34.0 - 46.6 %   MCV 94 79 - 97 fL   MCH 29.9 26.6 - 33.0 pg   MCHC 31.9 31.5 - 35.7 g/dL   RDW 15.7 (H) 12.3 - 15.4 %   Platelets 305 150 - 379 x10E3/uL   Neutrophils 53 Not Estab. %   Lymphs 36 Not Estab. %   Monocytes 8 Not Estab. %   Eos 2 Not Estab. %   Basos 1 Not Estab. %   Neutrophils Absolute 3.0 1.4 - 7.0 x10E3/uL   Lymphocytes Absolute 2.0 0.7 - 3.1 x10E3/uL   Monocytes Absolute 0.4 0.1 - 0.9 x10E3/uL   EOS (ABSOLUTE) 0.1 0.0 - 0.4 x10E3/uL   Basophils Absolute 0.0 0.0 - 0.2 x10E3/uL   Immature Granulocytes 0 Not Estab. %   Immature Grans (Abs) 0.0 0.0 - 0.1 x10E3/uL  Lipid panel     Status: Abnormal   Collection Time: 01/20/17 12:26 PM  Result Value Ref Range   Cholesterol, Total 202 (H) 100 - 199 mg/dL   Triglycerides 54 0 - 149 mg/dL   HDL 81 >39 mg/dL    Comment: **Effective February 01, 2017, HDL Cholesterol**   reference interval will be changing to:                                   Female        Female                               40 - 999999   50 - 999999    VLDL Cholesterol Cal 11 5 - 40   mg/dL   LDL Calculated 110 (H) 0 - 99 mg/dL   Chol/HDL Ratio 2.5 0.0 - 4.4 ratio    Comment:                                   T. Chol/HDL Ratio                                             Men  Women                               1/2 Avg.Risk  3.4    3.3                                   Avg.Risk  5.0    4.4                                2X Avg.Risk  9.6    7.1                                3X Avg.Risk 23.4   11.0   Surgical pcr screen     Status: Abnormal   Collection Time: 04/07/17  1:20 PM  Result Value Ref Range   MRSA, PCR INVALID RESULTS, SPECIMEN SENT FOR CULTURE (A) NEGATIVE    Comment: RESULT CALLED TO, READ BACK BY AND VERIFIED WITH: E EWURUM,RN 04/07/17 1359 RHOLMES    Staphylococcus aureus INVALID RESULTS, SPECIMEN SENT FOR CULTURE (A) NEGATIVE  Type and screen Order type and  screen if day of surgery is less than 15 days from draw of preadmission visit or order morning of surgery if day of surgery is greater than 6 days from preadmission visit.     Status: None   Collection Time: 04/07/17  1:56 PM  Result Value Ref Range   ABO/RH(D) O POS    Antibody Screen NEG    Sample Expiration 04/12/2017    Extend sample reason NO TRANSFUSIONS OR PREGNANCY IN THE PAST 3 MONTHS   ABO/Rh     Status: None   Collection Time: 04/07/17  1:56 PM  Result Value Ref Range   ABO/RH(D) O POS   APTT     Status: None   Collection Time: 04/07/17  1:58 PM  Result Value Ref Range   aPTT 31 24 - 36 seconds  CBC WITH DIFFERENTIAL     Status: None   Collection Time: 04/07/17  1:58 PM  Result Value Ref Range   WBC 6.6 4.0 - 10.5 K/uL   RBC 3.90 3.87 - 5.11 MIL/uL   Hemoglobin 12.2 12.0 - 15.0 g/dL   HCT 37.6 36.0 - 46.0 %   MCV 96.4 78.0 - 100.0 fL   MCH 31.3 26.0 - 34.0 pg   MCHC 32.4 30.0 - 36.0 g/dL   RDW 14.9 11.5 - 15.5 %   Platelets 281 150 - 400 K/uL   Neutrophils Relative % 56 %   Neutro Abs 3.7 1.7 - 7.7 K/uL   Lymphocytes Relative 34 %   Lymphs Abs 2.2   0.7 - 4.0 K/uL   Monocytes Relative 8 %   Monocytes Absolute 0.5 0.1 - 1.0 K/uL   Eosinophils Relative 2 %   Eosinophils Absolute 0.2 0.0 - 0.7 K/uL   Basophils Relative 0 %   Basophils Absolute 0.0 0.0 - 0.1 K/uL  Comprehensive metabolic panel     Status: None   Collection Time: 04/07/17  1:58 PM  Result Value Ref Range   Sodium 137 135 - 145 mmol/L   Potassium 4.0 3.5 - 5.1 mmol/L   Chloride 103 101 - 111 mmol/L   CO2 26 22 - 32 mmol/L   Glucose, Bld 97 65 - 99 mg/dL   BUN 15 6 - 20 mg/dL   Creatinine, Ser 0.56 0.44 - 1.00 mg/dL   Calcium 9.3 8.9 - 10.3 mg/dL   Total Protein 7.6 6.5 - 8.1 g/dL   Albumin 4.2 3.5 - 5.0 g/dL   AST 25 15 - 41 U/L   ALT 14 14 - 54 U/L   Alkaline Phosphatase 65 38 - 126 U/L   Total Bilirubin 0.4 0.3 - 1.2 mg/dL   GFR calc non Af Amer >60 >60 mL/min   GFR calc Af Amer >60 >60  mL/min    Comment: (NOTE) The eGFR has been calculated using the CKD EPI equation. This calculation has not been validated in all clinical situations. eGFR's persistently <60 mL/min signify possible Chronic Kidney Disease.    Anion gap 8 5 - 15  Protime-INR     Status: None   Collection Time: 04/07/17  1:58 PM  Result Value Ref Range   Prothrombin Time 13.6 11.4 - 15.2 seconds   INR 1.05   MRSA culture     Status: None (Preliminary result)   Collection Time: 04/07/17  1:58 PM  Result Value Ref Range   Specimen Description NOSE    Special Requests NONE    Culture      CULTURE REINCUBATED FOR BETTER GROWTH Performed at [facility], [address] [place], [place] [address]    Report Status PENDING   Urinalysis, Routine w reflex microscopic     Status: Abnormal   Collection Time: 04/07/17  2:08 PM  Result Value Ref Range   Color, Urine YELLOW YELLOW   APPearance CLOUDY (A) CLEAR   Specific Gravity, Urine 1.010 1.005 - 1.030   pH 7.0 5.0 - 8.0   Glucose, UA NEGATIVE NEGATIVE mg/dL   Hgb urine dipstick NEGATIVE NEGATIVE   Bilirubin Urine NEGATIVE NEGATIVE   Ketones, ur NEGATIVE NEGATIVE mg/dL   Protein, ur NEGATIVE NEGATIVE mg/dL   Nitrite NEGATIVE NEGATIVE   Leukocytes, UA TRACE (A) NEGATIVE   RBC / HPF 0-5 0 - 5 RBC/hpf   WBC, UA 0-5 0 - 5 WBC/hpf   Bacteria, UA RARE (A) NONE SEEN   Squamous Epithelial / LPF 6-30 (A) NONE SEEN    Estimated body mass index is 35.54 kg/m as calculated from the following:   Height as of this encounter: 5' (1.524 m).   Weight as of this encounter: 82.6 kg (182 lb).   Imaging Review Plain radiographs demonstrate severe degenerative joint disease of the left hip(s). The bone quality appears to be fair for age and reported activity level.  Assessment/Plan:  End stage arthritis, left hip(s)  The patient history, physical examination, clinical judgement of the provider and imaging studies are consistent with end stage degenerative  joint disease of the left hip(s) and total hip arthroplasty is deemed medically necessary. The   treatment options including medical management, injection therapy, arthroscopy and arthroplasty were discussed at length. The risks and benefits of total hip arthroplasty were presented and reviewed. The risks due to aseptic loosening, infection, stiffness, dislocation/subluxation,  thromboembolic complications and other imponderables were discussed.  The patient acknowledged the explanation, agreed to proceed with the plan and consent was signed. Patient is being admitted for inpatient treatment for surgery, pain control, PT, OT, prophylactic antibiotics, VTE prophylaxis, progressive ambulation and ADL's and discharge planning.The patient is planning to be discharged to skilled nursing facility

## 2017-04-09 NOTE — Progress Notes (Signed)
Plan for d/c to SNF, discharge planning per CSW. [phone] 

## 2017-04-09 NOTE — NC FL2 (Signed)
MEDICAID FL2 LEVEL OF CARE SCREENING TOOL     IDENTIFICATION  Patient Name: [name] BankerBernice W Lanter Birthdate: 1940/12/12 Sex: female Admission Date (Current Location): 04/09/2017  [facility] [place]   Facility and Address:  [facility],  [address], [place] [id number]      Provider Number: [id number]  Attending Physician Name and Address:  Jodi GeraldsGraves, John, MD  Relative Name and Phone Number:       Current Level of Care: Hospital Recommended Level of Care: Skilled Nursing Facility Prior Approval Number:    Date Approved/Denied:   PASRR Number:  [id number]   Discharge Plan: SNF    Current Diagnoses: Patient Active Problem List   Diagnosis Date Noted  . Primary osteoarthritis of left hip 01/20/2017  . Primary osteoarthritis of right hip 11/10/2015  . Anxiety state 11/10/2015  . Preoperative cardiovascular examination 07/23/2014  . Class 2 severe obesity due to excess calories with serious comorbidity and body mass index (BMI) of 36.0 to 36.9 in adult [facility]) 07/18/2013  . Bilateral edema of lower extremity 07/18/2013    Class: Chronic  . Metabolic syndrome 07/18/2013  . Rectal bleeding 07/07/2012  . Essential hypertension 07/07/2012  . GERD (gastroesophageal reflux disease) 07/07/2012  . Hyperlipidemia with target LDL less than 100 07/07/2012  . Rotator cuff tear, left 03/02/2012  . Osteoarthritis of left shoulder 01/21/2012    Orientation RESPIRATION BLADDER Height & Weight     Self, Time, Situation, Place    Continent Weight: 182 lb (82.6 kg) Height:  5' (152.4 cm)  BEHAVIORAL SYMPTOMS/MOOD NEUROLOGICAL BOWEL NUTRITION STATUS      Continent Diet  AMBULATORY STATUS COMMUNICATION OF NEEDS Skin   Extensive Assist Verbally Normal                       Personal Care Assistance Level of Assistance  Bathing, Feeding, Dressing Bathing Assistance: Limited assistance Feeding assistance: Independent Dressing Assistance:  Limited assistance     Functional Limitations Info  Sight, Hearing, Speech Sight Info: Adequate Hearing Info: Adequate Speech Info: Adequate    SPECIAL CARE FACTORS FREQUENCY  PT (By licensed PT), OT (By licensed OT)     PT Frequency: 7x/week  OT Frequency: 7x/week            Contractures Contractures Info: Not present    Additional Factors Info  Code Status, Allergies, Psychotropic Code Status Info: Fullcode Allergies Info: Allergies: Effexor Venlafaxine Hcl, Sulfa Drugs Cross Reactors           Current Medications (04/09/2017):  This is the current hospital active medication list Current Facility-Administered Medications  Medication Dose Route Frequency Provider Last Rate Last Dose  . 0.9 %  sodium chloride infusion   Intravenous Continuous Marshia LyBethune, James, PA-C      . acetaminophen (OFIRMEV) 10 MG/ML IV           . acetaminophen (TYLENOL) tablet 650 mg  650 mg Oral Q4H PRN Marshia LyBethune, James, PA-C       Or  . acetaminophen (TYLENOL) suppository 650 mg  650 mg Rectal Q4H PRN Marshia LyBethune, James, PA-C      . alum & mag hydroxide-simeth (MAALOX/MYLANTA) 200-200-20 MG/5ML suspension 30 mL  30 mL Oral Q4H PRN Marshia LyBethune, James, PA-C      . aspirin EC tablet 325 mg  325 mg Oral BID PC Bethune, James, PA-C      . bisacodyl (DULCOLAX) EC tablet 5 mg  5 mg Oral Daily  PRN Marshia LyBethune, James, PA-C      . ceFAZolin (ANCEF) 2-4 GM/100ML-% IVPB           . ceFAZolin (ANCEF) IVPB 2g/100 mL premix  2 g Intravenous Q8H Marshia LyBethune, James, PA-C      . dexamethasone (DECADRON) injection 10 mg  10 mg Intravenous Q12H Marshia LyBethune, James, PA-C      . diphenhydrAMINE (BENADRYL) 12.5 MG/5ML elixir 12.5-25 mg  12.5-25 mg Oral Q4H PRN Marshia LyBethune, James, PA-C      . docusate sodium (COLACE) capsule 100 mg  100 mg Oral BID Marshia LyBethune, James, PA-C      . [START ON 04/10/2017] ferrous sulfate tablet 325 mg  325 mg Oral Q breakfast Marshia LyBethune, James, PA-C      . gabapentin (NEURONTIN) capsule 300 mg  300 mg Oral BID Marshia LyBethune,  James, PA-C      . HYDROmorphone (DILAUDID) 1 MG/ML injection           . HYDROmorphone (DILAUDID) injection 0.5 mg  0.5 mg Intravenous Q3H PRN Marshia LyBethune, James, PA-C      . lisinopril (PRINIVIL,ZESTRIL) tablet 20 mg  20 mg Oral Daily Marshia LyBethune, James, PA-C      . LORazepam (ATIVAN) tablet 0.5 mg  0.5 mg Oral QHS PRN Marshia LyBethune, James, PA-C      . magnesium citrate solution 1 Bottle  1 Bottle Oral Once PRN Marshia LyBethune, James, PA-C      . methocarbamol (ROBAXIN) tablet 500 mg  500 mg Oral Q6H PRN Marshia LyBethune, James, PA-C       Or  . methocarbamol (ROBAXIN) 500 mg in dextrose 5 % 50 mL IVPB  500 mg Intravenous Q6H PRN Marshia LyBethune, James, PA-C 110 mL/hr at 04/09/17 1027 500 mg at 04/09/17 1027  . ondansetron (ZOFRAN) tablet 4 mg  4 mg Oral Q6H PRN Marshia LyBethune, James, PA-C       Or  . ondansetron [facility](ZOFRAN) injection 4 mg  4 mg Intravenous Q6H PRN Marshia LyBethune, James, PA-C      . oxyCODONE (Oxy IR/ROXICODONE) immediate release tablet 5-10 mg  5-10 mg Oral Q4H PRN Marshia LyBethune, James, PA-C      . polyethylene glycol (MIRALAX / GLYCOLAX) packet 17 g  17 g Oral Daily PRN Marshia LyBethune, James, PA-C      . [START ON 04/10/2017] pravastatin (PRAVACHOL) tablet 40 mg  40 mg Oral Daily Graves, John, MD      . tranexamic acid (CYKLOKAPRON) 1,000 mg in sodium chloride 0.9 % 100 mL IVPB  1,000 mg Intravenous Once Marshia LyBethune, James, PA-C         Discharge Medications: Please see discharge summary for a list of discharge medications.  Relevant Imaging Results:  Relevant Lab Results:   Additional Information SSN: [id number]  Clearance CootsNicole A Myesha Stillion, LCSW

## 2017-04-09 NOTE — Brief Op Note (Signed)
04/09/2017  9:25 AM  PATIENT:  [name]  76 y.o. female  PRE-OPERATIVE DIAGNOSIS:  LEFT HIP OSTEOARTHRITIS  POST-OPERATIVE DIAGNOSIS:  LEFT HIP OSTEOARTHRITIS  PROCEDURE:  Procedure(s): LEFT TOTAL HIP ARTHROPLASTY ANTERIOR APPROACH (Left)  SURGEON:  Surgeon(s) and Role:    [name]* Krina Mraz, MD - Primary  PHYSICIAN ASSISTANT:   ASSISTANTS: jim bethune   ANESTHESIA:   spinal  EBL:  400 mL   BLOOD ADMINISTERED:none  DRAINS: none   LOCAL MEDICATIONS USED:  MARCAINE    and OTHER experel  SPECIMEN:  No Specimen  DISPOSITION OF SPECIMEN:  N/A  COUNTS:  YES  TOURNIQUET:  * No tourniquets in log *  DICTATION: .Other Dictation: Dictation Number [id number]  PLAN OF CARE: Admit to inpatient   PATIENT DISPOSITION:  PACU - hemodynamically stable.   Delay start of Pharmacological VTE agent (>24hrs) due to surgical blood loss or risk of bleeding: no

## 2017-04-09 NOTE — Anesthesia Procedure Notes (Signed)
Spinal  Patient location during procedure: OR Staffing Anesthesiologist: Cristela BlueJackson, Armonte Tortorella, MD Spinal Block Patient position: sitting Prep: DuraPrep Patient monitoring: heart rate, blood pressure and continuous pulse ox Approach: right paramedian Location: L3-4 Injection technique: single-shot Needle Needle type: Sprotte  Needle gauge: 22 G Needle length: 9 cm Assessment Sensory level: T4 Additional Notes Spinal Dosage in OR  .75% Bupivicaine ml       1.6

## 2017-04-09 NOTE — Clinical Social Work Note (Addendum)
Clinical Social Work Assessment  Patient Details  Name: [name] MRN: [id number] Date of Birth: [birth year]  Date of referral:  04/09/17               Reason for consult:  Facility Placement                Permission sought to share information with:  Facility Sport and exercise psychologist, Family Supports Permission granted to share information::  Yes, Verbal Permission Granted  Name::        Agency::  Engineer, water  Relationship::  Daughter   Contact Information:     Housing/Transportation Living arrangements for the past 2 months:  [place] of Information:  Patient, Medical Team, Adult Children Patient Interpreter Needed:    Criminal Activity/Legal Involvement Pertinent to Current Situation/Hospitalization:  No - Comment as needed Significant Relationships:  Adult Children Lives with:  Self Do you feel safe going back to the place where you live?  Yes Need for family participation in patient care:  Yes (Dependent w/ mobility)   Care giving concerns:  Patient has a history of pain and functional disability in the left hip due to arthritis. Patient admitted for left total hip arthroplasty.    Social Worker assessment / plan: CSW received a call from [place], Therapist, sports. She reports the patient has already prearranged to go to [facility] for rehab.  CSW met with patient at bedside. Patient and daughter well knowledgeable of plan. Patient will need a 3 night stay.  CSW spoke w/ Linus Orn at [facility]. Patient FL2 and PASRR completed and received.  Patient to transport by PTAR.  Plan: CSW will assist with discharge to [facility] SN[facility]     Employment status:  Retired Insurance underwriter information:  Other (Comment Required)([employer]) PT Recommendations:  [place] / Referral to community resources:  [place]  Patient/Family's Response to care:  Agreeable and Responding well to care.   Patient/Family's Understanding of and  Emotional Response to Diagnosis, Current Treatment, and Prognosis:  Patient and daughter both knowledgeable of patient diagnosis and current treatment. The patient will continue rehab at SNF before returning home.   Emotional Assessment Appearance:  Developmentally appropriate Attitude/Demeanor/Rapport:    Affect (typically observed):  Accepting Orientation:  Oriented to Self, Oriented to Place, Oriented to  Time, Oriented to Situation Alcohol / Substance use:  Not Applicable Psych involvement (Current and /or in the community):  No (Comment)  Discharge Needs  Concerns to be addressed:  Discharge Planning Concerns Readmission within the last 30 days:  No Current discharge risk:  Dependent with Mobility Barriers to Discharge:  Continued Medical Work up   [employer], LCSW 04/09/2017, 12:16 PM

## 2017-04-10 LAB — BASIC METABOLIC PANEL
ANION GAP: 8 (ref 5–15)
BUN: 10 mg/dL (ref 6–20)
CALCIUM: 8.9 mg/dL (ref 8.9–10.3)
CHLORIDE: 104 mmol/L (ref 101–111)
CO2: 27 mmol/L (ref 22–32)
Creatinine, Ser: 0.69 mg/dL (ref 0.44–1.00)
GFR calc non Af Amer: >60 mL/min (ref >60)
Glucose, Bld: 131 mg/dL — ABNORMAL HIGH (ref 65–99)
POTASSIUM: 4.4 mmol/L (ref 3.5–5.1)
Sodium: 139 mmol/L (ref 135–145)

## 2017-04-10 LAB — CBC
HEMATOCRIT: 33.5 % — AB (ref 36.0–46.0)
HEMOGLOBIN: 10.9 g/dL — AB (ref 12.0–15.0)
MCH: 31.1 pg (ref 26.0–34.0)
MCHC: 32.5 g/dL (ref 30.0–36.0)
MCV: 95.4 fL (ref 78.0–100.0)
Platelets: 242 10*3/uL (ref 150–400)
RBC: 3.51 MIL/uL — AB (ref 3.87–5.11)
RDW: 14.4 % (ref 11.5–15.5)
WBC: 13.9 10*3/uL — AB (ref 4.0–10.5)

## 2017-04-10 LAB — MRSA CULTURE: Culture: NOT DETECTED

## 2017-04-10 NOTE — Progress Notes (Signed)
Physical Therapy Treatment Patient Details Name: [name] MRN: [id number] DOB: 1940-11-21 Today's Date: 04/10/2017    History of Present Illness Pt is a 76 y/o female s/p elective L THA, direct anterior approach. PMH includes, HTN, depression, and thoracic aortic atherosclerosis.     PT Comments    ROM exercises only this session. Pt very drowsy-barely able to keep eyes open during session. Deferred OOB activity for safety reasons. Will attempt again later today.    Follow Up Recommendations  SNF     Equipment Recommendations  None recommended by PT    Recommendations for Other Services       Precautions / Restrictions Precautions Precautions: Fall Restrictions Weight Bearing Restrictions: No LLE Weight Bearing: Weight bearing as tolerated    Mobility  Bed Mobility               General bed mobility comments: NT-pt too drowsy to safely attempt OOB activity. ROM exercises only this session  Transfers                    Ambulation/Gait                 Stairs            Wheelchair Mobility    Modified Rankin (Stroke Patients Only)       Balance                                            Cognition Arousal/Alertness: Lethargic Behavior During Therapy: WFL for tasks assessed/performed Overall Cognitive Status: Within Functional Limits for tasks assessed                                 General Comments: pt very drowsy. She had difficulty keeping her eyes open      Exercises Total Joint Exercises Ankle Circles/Pumps: AAROM;Both;10 reps;Supine Heel Slides: AAROM;Left;5 reps;Supine Hip ABduction/ADduction: AAROM;Left;5 reps;Supine    General Comments        Pertinent Vitals/Pain Pain Assessment: Faces Faces Pain Scale: Hurts little more Pain Location: L hip with exercises Pain Descriptors / Indicators: Grimacing;Sore Pain Intervention(s): Limited activity within patient's tolerance     Home Living                      Prior Function            PT Goals (current goals can now be found in the care plan section) Progress towards PT goals: Not progressing toward goals - comment(too drowsy this am)    Frequency    7X/week      PT Plan Current plan remains appropriate    Co-evaluation              AM-PAC PT "6 Clicks" Daily Activity  Outcome Measure  Difficulty turning over in bed (including adjusting bedclothes, sheets and blankets)?: Unable Difficulty moving from lying on back to sitting on the side of the bed? : Unable Difficulty sitting down on and standing up from a chair with arms (e.g., wheelchair, bedside commode, etc,.)?: Unable Help needed moving to and from a bed to chair (including a wheelchair)?: Total Help needed walking in hospital room?: Total Help needed climbing 3-5 steps with a railing? : Total 6 Click Score: 6    End of  Session   Activity Tolerance: (limited by drowsiness) Patient left: in bed;with call bell/phone within reach;with bed alarm set   PT Visit Diagnosis: Other abnormalities of gait and mobility (R26.89);Pain Pain - Right/Left: Left Pain - part of body: Hip     Time: 1100-1109 PT Time Calculation (min) (ACUTE ONLY): 9 min  Charges:  $Therapeutic Exercise: 8-22 mins                    G Codes:          Rebeca AlertJannie Cordaryl Decelles, MPT Pager: [phone]

## 2017-04-10 NOTE — Progress Notes (Signed)
Subjective: 1 Day Post-Op Procedure(s) (LRB): LEFT TOTAL HIP ARTHROPLASTY ANTERIOR APPROACH (Left) Patient reports pain as mild.  Foley out this morning.  Taking by mouth okay.  Mild somnolence.  Objective: Vital signs in last 24 hours: Temp:  [97.4 F (36.3 C)-99.5 F (37.5 C)] 99.5 F (37.5 C) (12/29 0510) Pulse Rate:  [45-59] 56 (12/29 0510) Resp:  [14-21] 16 (12/29 0510) BP: (107-151)/(49-101) 123/63 (12/29 0510) SpO2:  [98 %-100 %] 100 % (12/29 0510)  Intake/Output from previous day: 12/28 0701 - 12/29 0700 In: 3131.7 [P.O.:840; I.V.:2291.7] Out: 4575 [Urine:4175; Blood:400] Intake/Output this shift: No intake/output data recorded.  Recent Labs    04/07/17 1358 04/10/17 0542  HGB 12.2 10.9*   Recent Labs    04/07/17 1358 04/10/17 0542  WBC 6.6 13.9*  RBC 3.90 3.51*  HCT 37.6 33.5*  PLT 281 242   Recent Labs    04/07/17 1358 04/10/17 0542  NA 137 139  K 4.0 4.4  CL 103 104  CO2 26 27  BUN 15 10  CREATININE 0.56 0.69  GLUCOSE 97 131*  CALCIUM 9.3 8.9   Recent Labs    04/07/17 1358  INR 1.05   Left hip exam: Neurovascular intact Sensation intact distally Intact pulses distally Dorsiflexion/Plantar flexion intact Incision: dressing C/D/I Compartment soft  Assessment/Plan: 1 Day Post-Op Procedure(s) (LRB): LEFT TOTAL HIP ARTHROPLASTY ANTERIOR APPROACH (Left) Plan: Minimize pain meds. Weight-bear as tolerated on the left without hip precautions. Aspirin 325 mg twice daily for DVT prophylaxis. Up with therapy Discharge to SNF Monday  [name] G 04/10/2017, 8:51 AM

## 2017-04-10 NOTE — Progress Notes (Signed)
Physical Therapy Treatment Patient Details Name: [name] MRN: [id number] DOB: May 24, 1940 Today's Date: 04/10/2017    History of Present Illness Pt is a 76 y/o female s/p elective L THA, direct anterior approach. PMH includes, HTN, depression, and thoracic aortic atherosclerosis.     PT Comments    Progressing slowly with mobility. Pt still appears drowsy at times but she is able to participate this afternoon. Will continue to progress as able. Recommend SNF for continued rehab.    Follow Up Recommendations  SNF     Equipment Recommendations  None recommended by PT    Recommendations for Other Services       Precautions / Restrictions Precautions Precautions: Fall Restrictions Weight Bearing Restrictions: No LLE Weight Bearing: Weight bearing as tolerated    Mobility  Bed Mobility Overal bed mobility: Needs Assistance Bed Mobility: Supine to Sit     Supine to sit: Min assist;HOB elevated     General bed mobility comments: Assist to scoot to EOB. Increased time. Multimodal cueing for technique.   Transfers Overall transfer level: Needs assistance Equipment used: Rolling walker (2 wheeled) Transfers: Sit to/from Stand Sit to Stand: Min assist;From elevated surface         General transfer comment: Min A for lift assist,steadying, descent. Verbal cues for safe hand/LE placement.   Ambulation/Gait Ambulation/Gait assistance: Min assist Ambulation Distance (Feet): 25 Feet Assistive device: Rolling walker (2 wheeled) Gait Pattern/deviations: Step-to pattern;Antalgic     General Gait Details: Assist to stabilize throughout distance. VCs safety, sequence, step length. Slow gait speed. Followed with recliner.    Stairs            Wheelchair Mobility    Modified Rankin (Stroke Patients Only)       Balance                                            Cognition Arousal/Alertness: Awake/alert Behavior During Therapy: WFL for  tasks assessed/performed Overall Cognitive Status: Within Functional Limits for tasks assessed                                 General Comments: pt very drowsy. She had difficulty keeping her eyes open      Exercises Total Joint Exercises Ankle Circles/Pumps: AAROM;Both;10 reps;Supine Heel Slides: AAROM;Left;5 reps;Supine Hip ABduction/ADduction: AAROM;Left;5 reps;Supine    General Comments        Pertinent Vitals/Pain Pain Assessment: Faces Faces Pain Scale: Hurts even more Pain Location: L hip/thigh Pain Descriptors / Indicators: Grimacing;Sore Pain Intervention(s): Limited activity within patient's tolerance;Repositioned    Home Living                      Prior Function            PT Goals (current goals can now be found in the care plan section) Progress towards PT goals: Progressing toward goals    Frequency    7X/week      PT Plan Current plan remains appropriate    Co-evaluation              AM-PAC PT "6 Clicks" Daily Activity  Outcome Measure  Difficulty turning over in bed (including adjusting bedclothes, sheets and blankets)?: Unable Difficulty moving from lying on back to sitting on the side of the  bed? : Unable Difficulty sitting down on and standing up from a chair with arms (e.g., wheelchair, bedside commode, etc,.)?: Unable Help needed moving to and from a bed to chair (including a wheelchair)?: A Little Help needed walking in hospital room?: A Little Help needed climbing 3-5 steps with a railing? : A Lot 6 Click Score: 11    End of Session Equipment Utilized During Treatment: Gait belt Activity Tolerance: Patient tolerated treatment well Patient left: in chair;with call bell/phone within reach;with chair alarm set;with family/visitor present   PT Visit Diagnosis: Muscle weakness (generalized) (M62.81);Difficulty in walking, not elsewhere classified (R26.2);Pain Pain - Right/Left: Left Pain - part of body:  Leg     Time: 1444-1511 PT Time Calculation (min) (ACUTE ONLY): 27 min  Charges:  $Gait Training: 23-37 mins $Therapeutic Exercise: 8-22 mins                    G Codes:          Rebeca AlertJannie Niketa Turner, MPT Pager: [phone]

## 2017-04-10 NOTE — Op Note (Signed)
NAMDrusilla Kanner:  [name], [name]                ACCOUNT NO.:  [id number]  MEDICAL RECORD NO.:  [id number]  LOCATION:  1607                         [facility]  PHYSICIAN:  Harvie JuniorJohn L. Jeremias Broyhill, M.D.   DATE OF BIRTH:  Mar 16, 1941  DATE OF PROCEDURE:  04/09/2017 DATE OF DISCHARGE:                              OPERATIVE REPORT   PREOPERATIVE DIAGNOSIS:  End-stage degenerative joint disease, left hip, with bone-on-bone change.  POSTOPERATIVE DIAGNOSIS:  End-stage degenerative joint disease, left hip, with bone-on-bone change.  PROCEDURE: 1. Left total hip replacement with a Corail system size 11 femur, size 52-mm Gription Pinnacle cup, a +4 neutral liner, and a +1.5 delta ceramic hip ball for a 36-mm cup. 2. Interpretation of multiple intra-operative fluoroscopic images. SURGEON:  Harvie JuniorJohn L. Caleigha Zale, MD.  ASSISTANT:  Marshia LyJames Bethune, PA.  ANESTHESIA:  General.  BRIEF HISTORY:  [name] is a 76 year old female with long history and significant complaints of bilateral hip pain.  X-ray shows severe issues with her hips with protrusio and significant osteophyte formation and complete loss of joint space.  We treated conservatively for years.  She initially had significant issues with weight.  She has lost quite a bit of weight and got her BMI down well under 40 and at that point, we felt that this was our window of opportunity to fix her hips and because of her severe pain and failure of conservative care, she was taken to the operating room for this procedure.  DESCRIPTION OF PROCEDURE:  The patient was taken to the operating room and after adequate anesthesia was obtained with general anesthetic, the patient was placed supine on the operating table.  She was then moved onto the [place]ender[place]ana bed.  All bony prominences were well padded and boots were placed.  Foley was placed preoperatively.  Following this, the left hip was prepped and draped in the usual sterile fashion.  Following this, the attention was  turned to the left hip where an incision was made for an anterior approach to the hip.  Subcutaneous tissue was taken down the level of the tensor fascia.  There was difficulty identifying the tissue plane as I think just with her long-term obesity, but ultimately we were able to identify the vastus and so we put retractors above and below the hip, opened the hip capsule and tagged the hip capsule.  At this point, a provisional neck cut was made and the head was removed and following this, attention was turned towards exposed acetabulum.  We took osteophytes off and around the acetabulum and then we sequentially reamed the acetabulum to a level of 51 mm and put in a 52-mm Gription cup with 45 degrees of lateral opening and 30 degrees of anteversion. Following this, attention was turned to the stem side.  The stem was exposed, extended, externally rotated and the adducted and the capsular area of the proximal femur was opened and then a cookie cutter was used followed by a chili pepper followed by sequential rasping up to a level of 10.  Did a trial reduction at 10.  At this point, it looked like we were still a touch short.  Came back up.  The  10 was wiggling little bit.  Went to an 11 that seemed to fit.  Put in an 11 stem in and set a touch proud.  So, we trialed it again with a +0 ball and this gave us perfectly symmetric leg length.  Stability was great.  So, at this point, we opened the final ceramic hip ball and put it in place +1.5 and then did a final reduction.  Got a final images.  Excellent geometry of the hip was identified at this point.  At this point, attention was turned towards closure.  The capsule was closed with 1 Vicryl running. The tensor fascia was closed with 0 Vicryl running, the skin with 0 and 2-0 Vicryl and staples.  Sterile compressive dressing was applied and the patient was taken to the recovery room where she was noted to be in satisfactory condition.   Estimated blood loss for the procedure was about 400 mL, but the final can be gotten from anesthetic record.  Of note multiple intraoperative fluoroscopic images were taken during the case to assess geometry size, fit, and fill.     Harvie JuniorJohn L. Rifka Ramey, M.D.     Ranae PlumberJLG/MEDQ  D:  04/09/2017  T:  04/10/2017  Job:  [id number]

## 2017-04-11 LAB — CBC
HCT: 31 % — ABNORMAL LOW (ref 36.0–46.0)
Hemoglobin: 9.9 g/dL — ABNORMAL LOW (ref 12.0–15.0)
MCH: 30.5 pg (ref 26.0–34.0)
MCHC: 31.9 g/dL (ref 30.0–36.0)
MCV: 95.4 fL (ref 78.0–100.0)
PLATELETS: 225 10*3/uL (ref 150–400)
RBC: 3.25 MIL/uL — ABNORMAL LOW (ref 3.87–5.11)
RDW: 14.6 % (ref 11.5–15.5)
WBC: 11.6 10*3/uL — AB (ref 4.0–10.5)

## 2017-04-11 LAB — BASIC METABOLIC PANEL
ANION GAP: 5 (ref 5–15)
BUN: 14 mg/dL (ref 6–20)
CALCIUM: 8.8 mg/dL — AB (ref 8.9–10.3)
CO2: 29 mmol/L (ref 22–32)
CREATININE: 0.6 mg/dL (ref 0.44–1.00)
Chloride: 108 mmol/L (ref 101–111)
GLUCOSE: 157 mg/dL — AB (ref 65–99)
Potassium: 3.8 mmol/L (ref 3.5–5.1)
Sodium: 142 mmol/L (ref 135–145)

## 2017-04-11 NOTE — Progress Notes (Signed)
Physical Therapy Treatment Patient Details Name: [name] BankerBernice W Aldea MRN: [id number] DOB: 1940/07/18 Today's Date: 04/11/2017    History of Present Illness Pt is a 76 y/o female s/p elective L THA, direct anterior approach. PMH includes, HTN, depression, and thoracic aortic atherosclerosis.     PT Comments    The Patient is making steady progress. Plans DC to SNF.   Follow Up Recommendations  SNF     Equipment Recommendations  None recommended by PT    Recommendations for Other Services       Precautions / Restrictions Precautions Precautions: Fall Precaution Comments: wear depends Restrictions LLE Weight Bearing: Weight bearing as tolerated    Mobility  Bed Mobility Overal bed mobility: Needs Assistance Bed Mobility: Supine to Sit;Sit to Supine     Supine to sit: Mod assist Sit to supine: Mod assist   General bed mobility comments: Assist with legs and trunk .  Increased time. Multimodal cueing for technique.   Transfers Overall transfer level: Needs assistance Equipment used: Rolling walker (2 wheeled) Transfers: Sit to/from Stand   Stand pivot transfers: Min assist       General transfer comment: Min A for lift assist,steadying, descent. Verbal cues for safe hand/LE placement. from bed and recliner  Ambulation/Gait Ambulation/Gait assistance: Min assist Ambulation Distance (Feet): 25 Feet(x 2) Assistive device: Rolling walker (2 wheeled)       General Gait Details: Assist to stabilize throughout distance. VCs safety, sequence, step length. Slow gait speed. Followed with recliner. sitting rest break due to c/o dizziness. BP 138/85. Able to  ambulate back to room   Stairs            Wheelchair Mobility    Modified Rankin (Stroke Patients Only)       Balance                                            Cognition Arousal/Alertness: Awake/alert Behavior During Therapy: WFL for tasks assessed/performed Overall Cognitive  Status: Within Functional Limits for tasks assessed                                 General Comments: pt very drowsy. She had difficulty keeping her eyes open      Exercises      General Comments        Pertinent Vitals/Pain Pain Score: 6  Pain Location: L hip/thigh Pain Descriptors / Indicators: Grimacing;Sore Pain Intervention(s): Limited activity within patient's tolerance;Repositioned;Premedicated before session;Monitored during session;Ice applied    Home Living Family/patient expects to be discharged to:: Skilled nursing facility                    Prior Function            PT Goals (current goals can now be found in the care plan section) Progress towards PT goals: Progressing toward goals    Frequency    7X/week      PT Plan Current plan remains appropriate    Co-evaluation              AM-PAC PT "6 Clicks" Daily Activity  Outcome Measure  Difficulty turning over in bed (including adjusting bedclothes, sheets and blankets)?: A Lot Difficulty moving from lying on back to sitting on the side of the bed? : A Lot  Difficulty sitting down on and standing up from a chair with arms (e.g., wheelchair, bedside commode, etc,.)?: A Lot Help needed moving to and from a bed to chair (including a wheelchair)?: A Little Help needed walking in hospital room?: A Little Help needed climbing 3-5 steps with a railing? : Total 6 Click Score: 13    End of Session Equipment Utilized During Treatment: Gait belt Activity Tolerance: Patient tolerated treatment well Patient left: in bed;with call bell/phone within reach;with bed alarm set;with family/visitor present Nurse Communication: Mobility status PT Visit Diagnosis: Muscle weakness (generalized) (M62.81);Difficulty in walking, not elsewhere classified (R26.2);Pain Pain - Right/Left: Left Pain - part of body: Leg     Time: [id number] PT Time Calculation (min) (ACUTE ONLY): 23 min  Charges:   $Gait Training: 23-37 mins                    G CodesBlanchard Kelch:       Brande Uncapher PT [phone]    [name], Yesena Reaves [name] 04/11/2017, 4:24 PM

## 2017-04-11 NOTE — Anesthesia Postprocedure Evaluation (Signed)
Anesthesia Post Note  Patient: [name]  Procedure(s) Performed: LEFT TOTAL HIP ARTHROPLASTY ANTERIOR APPROACH (Left Hip)     Patient location during evaluation: PACU Anesthesia Type: Spinal Level of consciousness: awake Pain management: satisfactory to patient Vital Signs Assessment: post-procedure vital signs reviewed and stable Respiratory status: spontaneous breathing Cardiovascular status: blood pressure returned to baseline Postop Assessment: no headache and spinal receding Anesthetic complications: no    Last Vitals:  Vitals:   04/10/17 2207 04/11/17 0648  BP: (!) 106/53 (!) 104/53  Pulse: 60 (!) 52  Resp: 18 14  Temp: 36.9 C 37 C  SpO2: 99% 96%    Last Pain:  Vitals:   04/11/17 0715  TempSrc:   PainSc: 2                  [name] EDWARD

## 2017-04-11 NOTE — Progress Notes (Signed)
Subjective: 2 Days Post-Op Procedure(s) (LRB): LEFT TOTAL HIP ARTHROPLASTY ANTERIOR APPROACH (Left) Patient reports pain as mild.    Objective: Vital signs in last 24 hours: Temp:  [97.6 F (36.4 C)-98.6 F (37 C)] 98.6 F (37 C) (12/30 0648) Pulse Rate:  [52-62] 52 (12/30 0648) Resp:  [14-18] 14 (12/30 0648) BP: (104-117)/(53-67) 104/53 (12/30 0648) SpO2:  [96 %-100 %] 96 % (12/30 0648)  Intake/Output from previous day: 12/29 0701 - 12/30 0700 In: 2628.3 [P.O.:1080; I.V.:1448.3; IV Piggyback:100] Out: 3100 [Urine:3100] Intake/Output this shift: No intake/output data recorded.  Recent Labs    04/10/17 0542 04/11/17 0540  HGB 10.9* 9.9*   Recent Labs    04/10/17 0542 04/11/17 0540  WBC 13.9* 11.6*  RBC 3.51* 3.25*  HCT 33.5* 31.0*  PLT 242 225   Recent Labs    04/10/17 0542 04/11/17 0540  NA 139 142  K 4.4 3.8  CL 104 108  CO2 27 29  BUN 10 14  CREATININE 0.69 0.60  GLUCOSE 131* 157*  CALCIUM 8.9 8.8*   No results for input(s): LABPT, INR in the last 72 hours.  Neurologically intact ABD soft Neurovascular intact No cellulitis present Compartment soft Swelling is markedly improved from preoperatively in the left leg.  Assessment/Plan: 2 Days Post-Op Procedure(s) (LRB): LEFT TOTAL HIP ARTHROPLASTY ANTERIOR APPROACH (Left) Advance diet Up with therapy  The patient had originally been scheduled to go home.  After working with therapy yesterday with some discussion about the possibility of need for skilled nursing facility.  We will reassess her after her working with therapy today.  Based on our conversation I still think it is possible that she would go home tomorrow but we will see how she does with therapy today and tomorrow.  Harvie JuniorJohn L Shourya Macpherson 04/11/2017, 7:54 AM

## 2017-04-12 DIAGNOSIS — M129 Arthropathy, unspecified: Secondary | ICD-10-CM | POA: Diagnosis not present

## 2017-04-12 DIAGNOSIS — Z96643 Presence of artificial hip joint, bilateral: Secondary | ICD-10-CM | POA: Diagnosis not present

## 2017-04-12 DIAGNOSIS — Z4789 Encounter for other orthopedic aftercare: Secondary | ICD-10-CM | POA: Diagnosis not present

## 2017-04-12 DIAGNOSIS — I2510 Atherosclerotic heart disease of native coronary artery without angina pectoris: Secondary | ICD-10-CM | POA: Diagnosis not present

## 2017-04-12 DIAGNOSIS — M1990 Unspecified osteoarthritis, unspecified site: Secondary | ICD-10-CM | POA: Diagnosis not present

## 2017-04-12 DIAGNOSIS — M1612 Unilateral primary osteoarthritis, left hip: Secondary | ICD-10-CM | POA: Diagnosis not present

## 2017-04-12 DIAGNOSIS — Z96642 Presence of left artificial hip joint: Secondary | ICD-10-CM | POA: Diagnosis not present

## 2017-04-12 DIAGNOSIS — F419 Anxiety disorder, unspecified: Secondary | ICD-10-CM | POA: Diagnosis not present

## 2017-04-12 DIAGNOSIS — R2689 Other abnormalities of gait and mobility: Secondary | ICD-10-CM | POA: Diagnosis not present

## 2017-04-12 DIAGNOSIS — I10 Essential (primary) hypertension: Secondary | ICD-10-CM | POA: Diagnosis not present

## 2017-04-12 DIAGNOSIS — M25552 Pain in left hip: Secondary | ICD-10-CM | POA: Diagnosis not present

## 2017-04-12 DIAGNOSIS — D649 Anemia, unspecified: Secondary | ICD-10-CM | POA: Diagnosis not present

## 2017-04-12 DIAGNOSIS — M6281 Muscle weakness (generalized): Secondary | ICD-10-CM | POA: Diagnosis not present

## 2017-04-12 DIAGNOSIS — E785 Hyperlipidemia, unspecified: Secondary | ICD-10-CM | POA: Diagnosis not present

## 2017-04-12 DIAGNOSIS — F329 Major depressive disorder, single episode, unspecified: Secondary | ICD-10-CM | POA: Diagnosis not present

## 2017-04-12 LAB — CBC
HEMATOCRIT: 32.8 % — AB (ref 36.0–46.0)
Hemoglobin: 10.5 g/dL — ABNORMAL LOW (ref 12.0–15.0)
MCH: 30.9 pg (ref 26.0–34.0)
MCHC: 32 g/dL (ref 30.0–36.0)
MCV: 96.5 fL (ref 78.0–100.0)
Platelets: 251 10*3/uL (ref 150–400)
RBC: 3.4 MIL/uL — AB (ref 3.87–5.11)
RDW: 14.7 % (ref 11.5–15.5)
WBC: 10 10*3/uL (ref 4.0–10.5)

## 2017-04-12 MED ORDER — ASPIRIN 325 MG PO TBEC: 325.0000 mg | DELAYED_RELEASE_TABLET | Freq: Two times a day (BID) | ORAL | 0 refills | Status: DC

## 2017-04-12 NOTE — Progress Notes (Signed)
Report given to SNF.

## 2017-04-12 NOTE — Progress Notes (Signed)
Subjective: 3 Days Post-Op Procedure(s) (LRB): LEFT TOTAL HIP ARTHROPLASTY ANTERIOR APPROACH (Left) Patient reports pain as mild.  Progressing with physical therapy.  Denies chest pain or shortness of breath.  Taking by mouth okay.  Objective: Vital signs in last 24 hours: Temp:  [97.4 F (36.3 C)-98.9 F (37.2 C)] 98.9 F (37.2 C) (12/31 0523) Pulse Rate:  [63-68] 63 (12/31 0523) Resp:  [14-18] 14 (12/31 0523) BP: (97-115)/(57-73) 102/57 (12/31 1019) SpO2:  [97 %-100 %] 100 % (12/31 0523)  Intake/Output from previous day: 12/30 0701 - 12/31 0700 In: 480 [P.O.:480] Out: 1475 [Urine:1475] Intake/Output this shift: No intake/output data recorded.  Recent Labs    04/10/17 0542 04/11/17 0540 04/12/17 0547  HGB 10.9* 9.9* 10.5*   Recent Labs    04/11/17 0540 04/12/17 0547  WBC 11.6* 10.0  RBC 3.25* 3.40*  HCT 31.0* 32.8*  PLT 225 251   Recent Labs    04/10/17 0542 04/11/17 0540  NA 139 142  K 4.4 3.8  CL 104 108  CO2 27 29  BUN 10 14  CREATININE 0.69 0.60  GLUCOSE 131* 157*  CALCIUM 8.9 8.8*   No results for input(s): LABPT, INR in the last 72 hours. Left hip exam: Neurovascular intact Sensation intact distally Intact pulses distally Dorsiflexion/Plantar flexion intact Incision: dressing C/D/I Compartment soft  Assessment/Plan: 3 Days Post-Op Procedure(s) (LRB): LEFT TOTAL HIP ARTHROPLASTY ANTERIOR APPROACH (Left)  Plan: Aspirin enteric-coated 325 mg twice daily for DVT prophylaxis 1 month. Weight-bear as tolerated on the left without hip precautions. [facility] Follow-up with Dr. Luiz BlareGraves in 2 weeks.  Kavin Weckwerth G 04/12/2017, 10:22 AM

## 2017-04-12 NOTE — Discharge Summary (Signed)
Patient ID: [name] MRN: [id number] DOB/AGE: 09-11-1940 76 y.o.  Admit date: 04/09/2017 Discharge date: 04/12/2017  Admission Diagnoses:  Active Problems:   Primary osteoarthritis of left hip   Discharge Diagnoses:  Same  Past Medical History:  Diagnosis Date  . Anemia    on iron  . Anxiety   . Arthritis    shoulders, knees, hands, hips; status post left knee surgery  . Chronic bronchitis (HCC)    CXR 07/08/16  . Constipation   . Depression   . GERD (gastroesophageal reflux disease)    TAKES TUMS  . H/O: pneumonia 2003  . Heart murmur    ???   YEARS AGO; denies    . Hyperlipidemia    on med  . Hypertension   . Morbid obesity with BMI of 45.0-49.9, adult (HCC)   . Pneumonia    15-12YRS  . Shingles    recent 02/2017  . Thoracic aortic atherosclerosis (HCC)    CXR 07/08/16    Surgeries: Procedure(s): LEFT TOTAL HIP ARTHROPLASTY ANTERIOR APPROACH on 04/09/2017   Discharged Condition: Improved  Hospital Course: [name] is an 76 y.o. female who was admitted 04/09/2017 for operative treatment of<principal problem not specified>. Patient has severe unremitting pain that affects sleep, daily activities, and work/hobbies. After pre-op clearance the patient was taken to the operating room on 04/09/2017 and underwent  Procedure(s): LEFT TOTAL HIP ARTHROPLASTY ANTERIOR APPROACH.    Patient was given perioperative antibiotics:  Anti-infectives (From admission, onward)   Start     Dose/Rate Route Frequency Ordered Stop   04/09/17 2000  ceFAZolin (ANCEF) IVPB 2g/100 mL premix     2 g 200 mL/hr over 30 Minutes Intravenous Every 6 hours 04/09/17 1531 04/10/17 0940   04/09/17 1400  ceFAZolin (ANCEF) IVPB 2g/100 mL premix  Status:  Discontinued     2 g 200 mL/hr over 30 Minutes Intravenous Every 8 hours 04/09/17 1117 04/09/17 1531   04/09/17 0517  ceFAZolin (ANCEF) IVPB 2g/100 mL premix     2 g 200 mL/hr over 30 Minutes Intravenous On call to O.R. 04/09/17 [id number]  04/09/17 0735       Patient was given sequential compression devices, early ambulation, and chemoprophylaxis to prevent DVT.  Patient benefited maximally from hospital stay and there were no complications.    Recent vital signs:  Patient Vitals for the past 24 hrs:  BP Temp Temp src Pulse Resp SpO2  04/12/17 1019 (!) 102/57 - - - - -  04/12/17 0523 107/73 98.9 F (37.2 C) Oral 63 14 100 %  04/11/17 2216 100/60 - - - - -  04/11/17 2209 97/66 (!) 97.4 F (36.3 C) Oral 63 14 98 %  04/11/17 1359 115/63 98 F (36.7 C) Oral 68 18 97 %     Recent laboratory studies:  Recent Labs    04/10/17 0542 04/11/17 0540 04/12/17 0547  WBC 13.9* 11.6* 10.0  HGB 10.9* 9.9* 10.5*  HCT 33.5* 31.0* 32.8*  PLT 242 225 251  NA 139 142  --   K 4.4 3.8  --   CL 104 108  --   CO2 27 29  --   BUN 10 14  --   CREATININE 0.69 0.60  --   GLUCOSE 131* 157*  --   CALCIUM 8.9 8.8*  --      Discharge Medications:   Allergies as of 04/12/2017      Reactions   Effexor [venlafaxine Hcl] Swelling, Other (See Comments)  MD ADVISES PATIENT TO NOT TAKE IN FUTURE PT STOPPED, MD DC'd MED   Sulfa Drugs Cross Reactors Itching      Medication List    TAKE these medications   aspirin 325 MG EC tablet Take 1 tablet (325 mg total) by mouth 2 (two) times daily after a meal. Tate 1 month postop to decrease risk of blood clots. What changed:    medication strength  how much to take  when to take this  additional instructions   calcium carbonate 500 MG chewable tablet Commonly known as:  TUMS - dosed in mg elemental calcium Chew 2-4 tablets by mouth 2 (two) times daily as needed for indigestion or heartburn.   docusate sodium 100 MG capsule Commonly known as:  COLACE Take 1 capsule (100 mg total) by mouth 2 (two) times daily. What changed:  when to take this   Iron 325 (65 Fe) MG Tabs Take 1 tablet by mouth every morning.   lisinopril 20 MG tablet Commonly known as:  PRINIVIL,ZESTRIL Take  1 tablet (20 mg total) by mouth daily.   LORazepam 0.5 MG tablet Commonly known as:  ATIVAN Take 1 tablet (0.5 mg total) at bedtime as needed by mouth. for anxiety   oxyCODONE-acetaminophen 5-325 MG tablet Commonly known as:  PERCOCET/ROXICET Take 1-2 tablets by mouth every 6 (six) hours as needed for severe pain.   pravastatin 40 MG tablet Commonly known as:  PRAVACHOL Take 1 tablet (40 mg total) by mouth 2 (two) times daily. What changed:  when to take this   tiZANidine 2 MG tablet Commonly known as:  ZANAFLEX Take 1 tablet (2 mg total) by mouth every 8 (eight) hours as needed for muscle spasms.            Discharge Care Instructions  (From admission, onward)        Start     Ordered   04/12/17 0000  Weight bearing as tolerated    Question Answer Comment  Laterality left   Extremity Lower      04/12/17 1032      Diagnostic Studies: Dg Chest 2 View  Result Date: 04/07/2017 CLINICAL DATA:  Preoperative examination. History of chronic bronchitis, thoracic aortic atherosclerosis, hypertension, former smoker. Morbid obesity. EXAM: CHEST  2 VIEW COMPARISON:  Chest x-ray of July 08, 2016 FINDINGS: The lungs are adequately inflated. There is no focal infiltrate. There is no pleural effusion. The heart and pulmonary vascularity are normal. There is calcification in the wall of the aortic arch. The bony thorax exhibits no acute abnormality. There degenerative changes of the right shoulder. There is a prosthetic left shoulder joint. IMPRESSION: There is no acute cardiopulmonary abnormality. Thoracic aortic atherosclerosis. Electronically Signed   By: David  [place] M.D.   On: 04/07/2017 14:43   Dg C-arm 1-60 Min-no Report  Result Date: 04/09/2017 Fluoroscopy was utilized by the requesting physician.  No radiographic interpretation.    Disposition: 03-Skilled Nursing Facility  Discharge Instructions    Call MD / Call 911   Complete by:  As directed    If you experience  chest pain or shortness of breath, CALL 911 and be transported to the hospital emergency room.  If you develope a fever above 101 F, pus (white drainage) or increased drainage or redness at the wound, or calf pain, call your surgeon's office.   Diet general   Complete by:  As directed    Discharge instructions   Complete by:  As directed  Leave aqua cell dressing in place on the left hip until follow-up with Dr. Luiz BlareGraves.   Increase activity slowly as tolerated   Complete by:  As directed    Weight bearing as tolerated   Complete by:  As directed    Laterality:  left   Extremity:  Lower   Without hip precautions.       Contact information for follow-up providers    Jodi GeraldsGraves, John, MD. Schedule an appointment as soon as possible for a visit in 2 weeks.   Specialty:  Orthopedic Surgery Contact information: [address] [place] [address] [phone]            Contact information for after-discharge care    Destination    [facility] .   Service:  Skilled Nursing Contact information: [address] [place] [id number] [phone]                   Signed: Matthew FolksBETHUNE,Thresa Dozier G 04/12/2017, 10:33 AM

## 2017-04-12 NOTE — Progress Notes (Signed)
Physical Therapy Treatment Patient Details Name: [name] MRN: [id number] DOB: 1941/03/17 Today's Date: 04/12/2017    History of Present Illness Pt is a 76 y/o female s/p elective L THA, direct anterior approach. PMH includes, HTN, depression, and thoracic aortic atherosclerosis.     PT Comments    The patient is progressing well. Plans Dc today.   Follow Up Recommendations  SNF     Equipment Recommendations  None recommended by PT    Recommendations for Other Services       Precautions / Restrictions Precautions Precautions: Fall Precaution Comments: wear depends    Mobility  Bed Mobility   Bed Mobility: Supine to Sit     Supine to sit: Min guard     General bed mobility comments: self assisted legs today  Transfers Overall transfer level: Needs assistance Equipment used: Rolling walker (2 wheeled) Transfers: Sit to/from Stand Sit to Stand: Min assist;From elevated surface            Ambulation/Gait Ambulation/Gait assistance: Min assist Ambulation Distance (Feet): 50 Feet Assistive device: Rolling walker (2 wheeled) Gait Pattern/deviations: Step-to pattern;Step-through pattern     General Gait Details: Assist to stabilize throughout distance. No dizziness   Stairs            Wheelchair Mobility    Modified Rankin (Stroke Patients Only)       Balance                                            Cognition Arousal/Alertness: Awake/alert                                            Exercises Total Joint Exercises Ankle Circles/Pumps: AROM;Both;10 reps Heel Slides: AAROM;Left;10 reps    General Comments        Pertinent Vitals/Pain Pain Score: 7  Pain Location: L hip/thigh Pain Descriptors / Indicators: Grimacing;Sore Pain Intervention(s): Monitored during session;Premedicated before session;Ice applied    Home Living                      Prior Function            PT  Goals (current goals can now be found in the care plan section) Progress towards PT goals: Progressing toward goals    Frequency    7X/week      PT Plan Current plan remains appropriate    Co-evaluation              AM-PAC PT "6 Clicks" Daily Activity  Outcome Measure  Difficulty turning over in bed (including adjusting bedclothes, sheets and blankets)?: A Little Difficulty moving from lying on back to sitting on the side of the bed? : A Little Difficulty sitting down on and standing up from a chair with arms (e.g., wheelchair, bedside commode, etc,.)?: A Little Help needed moving to and from a bed to chair (including a wheelchair)?: A Little Help needed walking in hospital room?: A Little Help needed climbing 3-5 steps with a railing? : Total 6 Click Score: 16    End of Session Equipment Utilized During Treatment: Gait belt Activity Tolerance: Patient tolerated treatment well Patient left: in chair Nurse Communication: Mobility status PT Visit Diagnosis: Muscle weakness (generalized) (M62.81);Difficulty in  walking, not elsewhere classified (R26.2);Pain Pain - Right/Left: Left Pain - part of body: Leg     Time: [id number] PT Time Calculation (min) (ACUTE ONLY): 32 min  Charges:  $Gait Training: 23-37 mins                    G CodesBlanchard [name]:      [name] PT [phone]    [name], [name] 04/12/2017, 12:15 PM

## 2017-04-12 NOTE — Clinical Social Work Placement (Addendum)
   CLINICAL SOCIAL WORK PLACEMENT  NOTE  Date:  04/12/2017  Patient Details  Name: [name] MRN: [id number] Date of Birth: 06-03-1940  Clinical Social Work is seeking post-discharge placement for this patient at the Skilled  Nursing Facility level of care (*CSW will initial, date and re-position this form in  chart as items are completed):  No   Patient/family provided with [facility] Clinical [facility] Work Department's list of facilities offering this level of care within the geographic area requested by the patient (or if unable, by the patient's family).  No   Patient/family informed of their freedom to choose among providers that offer the needed level of care, that participate in Medicare, Medicaid or managed care program needed by the patient, have an available bed and are willing to accept the patient.  No   Patient/family informed of [facility]'s ownership interest in [facility] and [facility], as well as of the fact that they are under no obligation to receive care at these facilities.  PASRR submitted to EDS on       PASRR number received on       Existing PASRR number confirmed on 04/09/17     FL2 transmitted to all facilities in geographic area requested by pt/family on       FL2 transmitted to all facilities within larger geographic area on       Patient informed that his/her managed care company has contracts with or will negotiate with certain facilities, including the following:            Patient/family informed of bed offers received.  [facility]     Physician recommends and patient [facility]    [facility] 04[facility]12[facility].  Patient to be transferred to facility by [facility]     Patient family notified on 04/12/17 of transfer.  Name of family member notified:     CSW left voicemail for pt.  Daughter   PHYSICIAN       Additional Comment:     _______________________________________________ Clearance CootsNicole A Deitrich Steve, LCSW 04/12/2017, 9:27 AM

## 2017-04-14 DIAGNOSIS — F419 Anxiety disorder, unspecified: Secondary | ICD-10-CM | POA: Diagnosis not present

## 2017-04-14 DIAGNOSIS — M1612 Unilateral primary osteoarthritis, left hip: Secondary | ICD-10-CM | POA: Diagnosis not present

## 2017-04-14 DIAGNOSIS — I10 Essential (primary) hypertension: Secondary | ICD-10-CM | POA: Diagnosis not present

## 2017-04-14 DIAGNOSIS — M25552 Pain in left hip: Secondary | ICD-10-CM | POA: Diagnosis not present

## 2017-04-14 DIAGNOSIS — R2689 Other abnormalities of gait and mobility: Secondary | ICD-10-CM | POA: Diagnosis not present

## 2017-04-14 DIAGNOSIS — Z96642 Presence of left artificial hip joint: Secondary | ICD-10-CM | POA: Diagnosis not present

## 2017-04-15 DIAGNOSIS — R2689 Other abnormalities of gait and mobility: Secondary | ICD-10-CM | POA: Diagnosis not present

## 2017-04-15 DIAGNOSIS — M25552 Pain in left hip: Secondary | ICD-10-CM | POA: Diagnosis not present

## 2017-04-19 DIAGNOSIS — Z96642 Presence of left artificial hip joint: Secondary | ICD-10-CM | POA: Diagnosis not present

## 2017-04-19 DIAGNOSIS — M25552 Pain in left hip: Secondary | ICD-10-CM | POA: Diagnosis not present

## 2017-04-19 DIAGNOSIS — M1612 Unilateral primary osteoarthritis, left hip: Secondary | ICD-10-CM | POA: Diagnosis not present

## 2017-04-19 DIAGNOSIS — R2689 Other abnormalities of gait and mobility: Secondary | ICD-10-CM | POA: Diagnosis not present

## 2017-04-22 DIAGNOSIS — M6281 Muscle weakness (generalized): Secondary | ICD-10-CM | POA: Diagnosis not present

## 2017-04-22 DIAGNOSIS — R2689 Other abnormalities of gait and mobility: Secondary | ICD-10-CM | POA: Diagnosis not present

## 2017-04-22 DIAGNOSIS — Z4789 Encounter for other orthopedic aftercare: Secondary | ICD-10-CM | POA: Diagnosis not present

## 2017-04-23 DIAGNOSIS — Z4789 Encounter for other orthopedic aftercare: Secondary | ICD-10-CM | POA: Diagnosis not present

## 2017-04-23 DIAGNOSIS — M6281 Muscle weakness (generalized): Secondary | ICD-10-CM | POA: Diagnosis not present

## 2017-04-23 DIAGNOSIS — R2689 Other abnormalities of gait and mobility: Secondary | ICD-10-CM | POA: Diagnosis not present

## 2017-04-26 DIAGNOSIS — Z471 Aftercare following joint replacement surgery: Secondary | ICD-10-CM | POA: Diagnosis not present

## 2017-04-28 DIAGNOSIS — M6281 Muscle weakness (generalized): Secondary | ICD-10-CM | POA: Diagnosis not present

## 2017-04-28 DIAGNOSIS — Z4789 Encounter for other orthopedic aftercare: Secondary | ICD-10-CM | POA: Diagnosis not present

## 2017-04-28 DIAGNOSIS — R2689 Other abnormalities of gait and mobility: Secondary | ICD-10-CM | POA: Diagnosis not present

## 2017-05-03 DIAGNOSIS — Z4789 Encounter for other orthopedic aftercare: Secondary | ICD-10-CM | POA: Diagnosis not present

## 2017-05-03 DIAGNOSIS — R2689 Other abnormalities of gait and mobility: Secondary | ICD-10-CM | POA: Diagnosis not present

## 2017-05-03 DIAGNOSIS — M6281 Muscle weakness (generalized): Secondary | ICD-10-CM | POA: Diagnosis not present

## 2017-05-04 DIAGNOSIS — Z4789 Encounter for other orthopedic aftercare: Secondary | ICD-10-CM | POA: Diagnosis not present

## 2017-05-04 DIAGNOSIS — Z9889 Other specified postprocedural states: Secondary | ICD-10-CM | POA: Diagnosis not present

## 2017-05-04 DIAGNOSIS — M6281 Muscle weakness (generalized): Secondary | ICD-10-CM | POA: Diagnosis not present

## 2017-05-04 DIAGNOSIS — R2689 Other abnormalities of gait and mobility: Secondary | ICD-10-CM | POA: Diagnosis not present

## 2017-05-07 DIAGNOSIS — R2689 Other abnormalities of gait and mobility: Secondary | ICD-10-CM | POA: Diagnosis not present

## 2017-05-07 DIAGNOSIS — M6281 Muscle weakness (generalized): Secondary | ICD-10-CM | POA: Diagnosis not present

## 2017-05-07 DIAGNOSIS — Z4789 Encounter for other orthopedic aftercare: Secondary | ICD-10-CM | POA: Diagnosis not present

## 2017-05-10 DIAGNOSIS — M6281 Muscle weakness (generalized): Secondary | ICD-10-CM | POA: Diagnosis not present

## 2017-05-10 DIAGNOSIS — R2689 Other abnormalities of gait and mobility: Secondary | ICD-10-CM | POA: Diagnosis not present

## 2017-05-10 DIAGNOSIS — Z4789 Encounter for other orthopedic aftercare: Secondary | ICD-10-CM | POA: Diagnosis not present

## 2017-05-14 DIAGNOSIS — Z4789 Encounter for other orthopedic aftercare: Secondary | ICD-10-CM | POA: Diagnosis not present

## 2017-05-14 DIAGNOSIS — R2689 Other abnormalities of gait and mobility: Secondary | ICD-10-CM | POA: Diagnosis not present

## 2017-05-14 DIAGNOSIS — M6281 Muscle weakness (generalized): Secondary | ICD-10-CM | POA: Diagnosis not present

## 2017-05-14 DIAGNOSIS — Z471 Aftercare following joint replacement surgery: Secondary | ICD-10-CM | POA: Diagnosis not present

## 2017-05-25 DIAGNOSIS — Z96642 Presence of left artificial hip joint: Secondary | ICD-10-CM | POA: Diagnosis not present

## 2017-05-25 DIAGNOSIS — M25552 Pain in left hip: Secondary | ICD-10-CM | POA: Diagnosis not present

## 2017-07-21 ENCOUNTER — Other Ambulatory Visit: Payer: Self-pay | Admitting: Family Medicine

## 2017-08-11 ENCOUNTER — Encounter: Payer: Self-pay | Admitting: Family Medicine

## 2017-08-11 ENCOUNTER — Other Ambulatory Visit: Payer: Self-pay

## 2017-08-11 ENCOUNTER — Ambulatory Visit (INDEPENDENT_AMBULATORY_CARE_PROVIDER_SITE_OTHER): Payer: MEDICARE | Admitting: Family Medicine

## 2017-08-11 VITALS — BP 118/82 | HR 70 | Temp 98.0°F | Resp 16 | Wt 192.0 lb

## 2017-08-11 DIAGNOSIS — E785 Hyperlipidemia, unspecified: Secondary | ICD-10-CM

## 2017-08-11 DIAGNOSIS — R350 Frequency of micturition: Secondary | ICD-10-CM

## 2017-08-11 DIAGNOSIS — N3941 Urge incontinence: Secondary | ICD-10-CM | POA: Diagnosis not present

## 2017-08-11 DIAGNOSIS — I10 Essential (primary) hypertension: Secondary | ICD-10-CM | POA: Diagnosis not present

## 2017-08-11 DIAGNOSIS — M1612 Unilateral primary osteoarthritis, left hip: Secondary | ICD-10-CM

## 2017-08-11 DIAGNOSIS — M1611 Unilateral primary osteoarthritis, right hip: Secondary | ICD-10-CM

## 2017-08-11 DIAGNOSIS — R600 Localized edema: Secondary | ICD-10-CM

## 2017-08-11 DIAGNOSIS — E8881 Metabolic syndrome: Secondary | ICD-10-CM

## 2017-08-11 DIAGNOSIS — F411 Generalized anxiety disorder: Secondary | ICD-10-CM | POA: Diagnosis not present

## 2017-08-11 LAB — POCT URINALYSIS DIP (MANUAL ENTRY)
Bilirubin, UA: NEGATIVE
Blood, UA: NEGATIVE
GLUCOSE UA: NEGATIVE mg/dL
Ketones, POC UA: NEGATIVE mg/dL
NITRITE UA: NEGATIVE
Protein Ur, POC: NEGATIVE mg/dL
Spec Grav, UA: 1.015 (ref 1.010–1.025)
UROBILINOGEN UA: 0.2 U/dL
pH, UA: 7.5 (ref 5.0–8.0)

## 2017-08-11 MED ORDER — PRAVASTATIN SODIUM 40 MG PO TABS: 80.0000 mg | ORAL_TABLET | Freq: Every day | ORAL | 3 refills | Status: DC

## 2017-08-11 MED ORDER — LORAZEPAM 0.5 MG PO TABS: 0.5000 mg | ORAL_TABLET | Freq: Every evening | ORAL | 0 refills | Status: DC | PRN

## 2017-08-11 NOTE — Patient Instructions (Addendum)
   IF you received an x-ray today, you will receive an invoice from [facility]. Please contact [facility] at [phone] with questions or concerns regarding your invoice.   IF you received labwork today, you will receive an invoice from [facility]. Please contact [facility] at [phone] with questions or concerns regarding your invoice.   Our billing staff will not be able to assist you with questions regarding bills from these companies.  You will be contacted with the lab results as soon as they are available. The fastest way to get your results is to activate your My Chart account. Instructions are located on the last page of this paperwork. If you have not heard from us regarding the results in 2 weeks, please contact this office.    Urinary Incontinence Urinary incontinence is the involuntary loss of urine from your bladder. What are the causes? There are many causes of urinary incontinence. They include:  Medicines.  Infections.  Prostatic enlargement, leading to overflow of urine from your bladder.  Surgery.  Neurological diseases.  Emotional factors.  What are the signs or symptoms? Urinary Incontinence can be divided into four types: 1. Urge incontinence. Urge incontinence is the involuntary loss of urine before you have the opportunity to go to the bathroom. There is a sudden urge to void but not enough time to reach a bathroom. 2. Stress incontinence. Stress incontinence is the sudden loss of urine with any activity that forces urine to pass. It is commonly caused by anatomical changes to the pelvis and sphincter areas of your body. 3. Overflow incontinence. Overflow incontinence is the loss of urine from an obstructed opening to your bladder. This results in a backup of urine and a resultant buildup of pressure within the bladder. When the pressure within the bladder exceeds the closing pressure of the sphincter, the urine overflows, which causes  incontinence, similar to water overflowing a dam. 4. Total incontinence. Total incontinence is the loss of urine as a result of the inability to store urine within your bladder.  How is this diagnosed? Evaluating the cause of incontinence may require:  A thorough and complete medical and obstetric history.  A complete physical exam.  Laboratory tests such as a urine culture and sensitivities.  When additional tests are indicated, they can include:  An ultrasound exam.  Kidney and bladder X-rays.  Cystoscopy. This is an exam of the bladder using a narrow scope.  Urodynamic testing to test the nerve function to the bladder and sphincter areas.  How is this treated? Treatment for urinary incontinence depends on the cause:  For urge incontinence caused by a bacterial infection, antibiotics will be prescribed. If the urge incontinence is related to medicines you take, your health care provider may have you change the medicine.  For stress incontinence, surgery to re-establish anatomical support to the bladder or sphincter, or both, will often correct the condition.  For overflow incontinence caused by an enlarged prostate, an operation to open the channel through the enlarged prostate will allow the flow of urine out of the bladder. In women with fibroids, a hysterectomy may be recommended.  For total incontinence, surgery on your urinary sphincter may help. An artificial urinary sphincter (an inflatable cuff placed around the urethra) may be required. In women who have developed a hole-like passage between their bladder and vagina (vesicovaginal fistula), surgery to close the fistula often is required.  Follow these instructions at home:  Normal daily hygiene and the use of pads or   adult diapers that are changed regularly will help prevent odors and skin damage.  Avoid caffeine. It can overstimulate your bladder.  Use the bathroom regularly. Try about every 2-3 hours to go to the  bathroom, even if you do not feel the need to do so. Take time to empty your bladder completely. After urinating, wait a minute. Then try to urinate again.  For causes involving nerve dysfunction, keep a log of the medicines you take and a journal of the times you go to the bathroom. Contact a health care provider if:  You experience worsening of pain instead of improvement in pain after your procedure.  Your incontinence becomes worse instead of better. Get help right away if:  You experience fever or shaking chills.  You are unable to pass your urine.  You have redness spreading into your groin or down into your thighs. This information is not intended to replace advice given to you by your health care provider. Make sure you discuss any questions you have with your health care provider. Document Released: 05/07/2004 Document Revised: 11/08/2015 Document Reviewed: 09/06/2012 Elsevier Interactive Patient Education  2018 [employer]  

## 2017-08-11 NOTE — Progress Notes (Signed)
Subjective:    Patient ID: [name], female    DOB: Jun 27, 1940, 77 y.o.   MRN: [id number]  08/11/2017  Chronic Conditions (follow-up) and Urinary Frequency    HPI This 77 y.o. female presents for six month follow-up of hypertension, hypercholesterolemia, GERD, and  HIP OSTEOARTHRTIIS s/p THR.  Management changes made at last visit include the following: -controlled chronic medical conditions including hypertension, GERD, hypercholesterolemia.  Obtain labs for chronic disease management. -needs pre-operative evaluation for upcoming total hip replacement.  Obtain labs and EKG.  Asymptomatic at this time. --recommend weight loss, exercise for 30-60 minutes five days per week; recommend 1200 kcal restriction per day with a minimum of 60 grams of protein per day. -increase family stressors due to multiple deaths; using Lorazepam qhs as needed for anxiety and insomnia yet not using daily.   UPDATE: S/p LEFT total hip replacement; did really well.   S/p evaluation by Dr. Billy Coast in 2018; no uterine prolapse; has cystocele/bladder prolapse.   Urinate frequently; urinary incontinence; wears pull up.  Intermittent throughout the day; cut out drinking coffee; drinking more water.  Less caffeine; less soda. No history of urology consultation. Patient reports good compliance with medication, good tolerance to medication, and good symptom control.   Blood pressure running well at home.   BP Readings from Last 3 Encounters:  10/04/17 104/66  08/11/17 118/82  04/12/17 (!) 102/57   Wt Readings from Last 3 Encounters:  10/04/17 199 lb 3.2 oz (90.4 kg)  08/11/17 192 lb (87.1 kg)  04/09/17 182 lb (82.6 kg)   Immunization History  Administered Date(s) Administered  . Influenza,inj,Quad PF,6+ Mos 12/06/2014, 07/10/2016, 01/20/2017  . PPD Test 07/26/2016  . Pneumococcal Conjugate-13 12/06/2014  . Pneumococcal Polysaccharide-23 01/18/2009  . Td 08/28/2015    Review of Systems    Constitutional: Negative for activity change, appetite change, chills, diaphoresis, fatigue, fever and unexpected weight change.  HENT: Negative for congestion, dental problem, drooling, ear discharge, ear pain, facial swelling, hearing loss, mouth sores, nosebleeds, postnasal drip, rhinorrhea, sinus pressure, sneezing, sore throat, tinnitus, trouble swallowing and voice change.   Eyes: Negative for photophobia, pain, discharge, redness, itching and visual disturbance.  Respiratory: Negative for apnea, cough, choking, chest tightness, shortness of breath, wheezing and stridor.   Cardiovascular: Negative for chest pain, palpitations and leg swelling.  Gastrointestinal: Negative for abdominal distention, abdominal pain, anal bleeding, blood in stool, constipation, diarrhea, nausea, rectal pain and vomiting.  Endocrine: Negative for cold intolerance, heat intolerance, polydipsia, polyphagia and polyuria.  Genitourinary: Positive for frequency and urgency. Negative for decreased urine volume, difficulty urinating, dyspareunia, dysuria, enuresis, flank pain, genital sores, hematuria, menstrual problem, pelvic pain, vaginal bleeding, vaginal discharge and vaginal pain.  Musculoskeletal: Negative for arthralgias, back pain, gait problem, joint swelling, myalgias, neck pain and neck stiffness.  Skin: Negative for color change, pallor, rash and wound.  Allergic/Immunologic: Negative for environmental allergies, food allergies and immunocompromised state.  Neurological: Negative for dizziness, tremors, seizures, syncope, facial asymmetry, speech difficulty, weakness, light-headedness, numbness and headaches.  Hematological: Negative for adenopathy. Does not bruise/bleed easily.  Psychiatric/Behavioral: Negative for agitation, behavioral problems, confusion, decreased concentration, dysphoric mood, hallucinations, self-injury, sleep disturbance and suicidal ideas. The patient is not nervous/anxious and is not  hyperactive.     Past Medical History:  Diagnosis Date  . Anemia    on iron  . Anxiety   . Arthritis    shoulders, knees, hands, hips; status post left knee surgery  . Chronic bronchitis (HCC)  CXR 07/08/16  . Constipation   . Depression   . GERD (gastroesophageal reflux disease)    TAKES TUMS  . H/O: pneumonia 2003  . Heart murmur    ???   YEARS AGO; denies    . Hyperlipidemia    on med  . Hypertension   . Morbid obesity with BMI of 45.0-49.9, adult (HCC)   . Pneumonia    15-23YRS  . Shingles    recent 02/2017  . Thoracic aortic atherosclerosis (HCC)    CXR 07/08/16   Past Surgical History:  Procedure Laterality Date  . CARDIAC CATHETERIZATION  04/20/1996   patent coronaries, EF 74% (Dr. Aram Candela)  . CARPAL TUNNEL RELEASE Left   . CATARACT EXTRACTION, BILATERAL    . COLONOSCOPY    . KNEE SURGERY Left 11/2001  . REPLACEMENT TOTAL KNEE Left 10/2009  . REPLACEMENT TOTAL KNEE Left 10/2009  . SHOULDER OPEN ROTATOR CUFF REPAIR  03/01/2012   Procedure: ROTATOR CUFF REPAIR SHOULDER OPEN;  Surgeon: Mable Paris, MD;  [facility];  Service: Orthopedics;  Laterality: Left;  SUBSCAPULARIS REPAIR  . STRESS TEST - ADENOSINE THALLIUM  1997   apical and inferior segments with questionable infarct and ischemia in anteroseptal & inferoseptal segments (Dr. Standley Brooking)  . SVD     x 7  . TOTAL HIP ARTHROPLASTY Right 07/24/2016  . TOTAL HIP ARTHROPLASTY Right 07/24/2016   Procedure: RIGHT TOTAL HIP ARTHROPLASTY ANTERIOR APPROACH;  Surgeon: [name], MD;  Location: [facility] OR;  Service: Orthopedics;  Laterality: Right;  . TOTAL HIP ARTHROPLASTY Left 04/09/2017   Procedure: LEFT TOTAL HIP ARTHROPLASTY ANTERIOR APPROACH;  Surgeon: [name], MD;  Location: [facility] ORS;  Service: Orthopedics;  Laterality: Left;  . TOTAL SHOULDER ARTHROPLASTY  01/18/2012   Procedure: TOTAL SHOULDER ARTHROPLASTY;  Surgeon: Mable Paris, MD;  Location: [facility] OR;  Service: Orthopedics;   Laterality: Left;  . TOTAL SHOULDER REPLACEMENT Left 01/19/2012  . TRANSTHORACIC ECHOCARDIOGRAM  06/2011   LV mildly dilated, EF=>55%; mild mitral annular calcif, mild MR; mild TR; AV mildly sclerotic, mild aortic regurg  . TUBAL LIGATION    . WISDOM TOOTH EXTRACTION     Allergies  Allergen Reactions  . Effexor [Venlafaxine Hcl] Swelling and Other (See Comments)    MD ADVISES PATIENT TO NOT TAKE IN FUTURE PT STOPPED, MD DC'd MED  . Sulfa Drugs Cross Reactors Itching   Current Outpatient Medications on File Prior to Visit  Medication Sig Dispense Refill  . aspirin EC 325 MG EC tablet Take 1 tablet (325 mg total) by mouth 2 (two) times daily after a meal. Tate 1 month postop to decrease risk of blood clots. (Patient taking differently: Take 325 mg by mouth 2 (two) times daily after a meal. Tate 1 month postop to decrease risk of blood clots. Told to reduce to 81 mg 10/03/2017) 60 tablet 0  . calcium carbonate (TUMS - DOSED IN MG ELEMENTAL CALCIUM) 500 MG chewable tablet Chew 2-4 tablets by mouth 2 (two) times daily as needed for indigestion or heartburn.    . docusate sodium (COLACE) 100 MG capsule Take 1 capsule (100 mg total) by mouth 2 (two) times daily. (Patient taking differently: Take 100 mg by mouth daily. ) 30 capsule 0  . Ferrous Sulfate (IRON) 325 (65 Fe) MG TABS Take 1 tablet by mouth every morning.    Marland Kitchen lisinopril (PRINIVIL,ZESTRIL) 20 MG tablet TAKE 1 TABLET BY MOUTH EVERY DAY 90 tablet 1   No current facility-administered medications  on file prior to visit.    Social History   Socioeconomic History  . Marital status: Widowed    Spouse name: Not on file  . Number of children: 7  . Years of education: Not on file  . Highest education level: Not on file  Occupational History  . Occupation: retired    Associate Professor: RETIRED  Social Needs  . Financial resource strain: Not on file  . Food insecurity:    Worry: Not on file    Inability: Not on file  . Transportation needs:     Medical: Not on file    Non-medical: Not on file  Tobacco Use  . Smoking status: Former Smoker    Packs/day: 1.00    Years: 25.00    Pack years: 25.00    Types: Cigarettes    Last attempt to quit: 03/07/1984    Years since quitting: 33.6  . Smokeless tobacco: Former Engineer, water and Sexual Activity  . Alcohol use: No  . Drug use: No  . Sexual activity: Never    Birth control/protection: Surgical  Lifestyle  . Physical activity:    Days per week: Not on file    Minutes per session: Not on file  . Stress: Not on file  Relationships  . Social connections:    Talks on phone: Not on file    Gets together: Not on file    Attends religious service: Not on file    Active member of club or organization: Not on file    Attends meetings of clubs or organizations: Not on file    Relationship status: Not on file  . Intimate partner violence:    Fear of current or ex partner: Not on file    Emotionally abused: Not on file    Physically abused: Not on file    Forced sexual activity: Not on file  Other Topics Concern  . Not on file  Social History Narrative   Marital status:   She is a widowed since 2009; married 45 years      Children:   mother of 25, grandmother of 5, great-grandmother of 6.      Lives: alone with home; daughter, son lives with patient      Tobacco: quit smoking 34 years ago      Alcohol: none      ADLs:  Walks with cane; drives.     She does not drink alcohol or smoke cigarettes.   She previously tried exercise routinely but is not exercising as much lately. Her excuse is do to knee pain and back pain as well as cold weather.   Family History  Problem Relation Age of Onset  . Heart disease Mother   . Heart disease Father   . Heart disease Sister   . Hypertension Sister   . Heart disease Sister   . Hypertension Sister   . Heart disease Sister   . Hypertension Sister        Objective:    BP 118/82   Pulse 70   Temp 98 F (36.7 C) (Oral)   Resp 16    Wt 192 lb (87.1 kg)   SpO2 97%   BMI 37.50 kg/m  Physical Exam  Constitutional: She is oriented to person, place, and time. She appears well-developed and well-nourished. No distress.  HENT:  Head: Normocephalic and atraumatic.  Right Ear: External ear normal.  Left Ear: External ear normal.  Nose: Nose normal.  Mouth/Throat: Oropharynx is clear and  moist.  Eyes: Pupils are equal, round, and reactive to light. Conjunctivae and EOM are normal.  Neck: Normal range of motion. Neck supple. Carotid bruit is not present. No thyromegaly present.  Cardiovascular: Normal rate, regular rhythm, normal heart sounds and intact distal pulses. Exam reveals no gallop and no friction rub.  No murmur heard. Pulmonary/Chest: Effort normal and breath sounds normal. She has no wheezes. She has no rales.  Abdominal: Soft. Bowel sounds are normal. She exhibits no distension and no mass. There is no tenderness. There is no rebound and no guarding.  Musculoskeletal: She exhibits edema.  Lymphadenopathy:    She has no cervical adenopathy.  Neurological: She is alert and oriented to person, place, and time. No cranial nerve deficit.  Skin: Skin is warm and dry. No rash noted. She is not diaphoretic. No erythema. No pallor.  Psychiatric: She has a normal mood and affect. Her behavior is normal.   No results found. Depression screen [facility] 2/9 08/11/2017 01/20/2017 07/10/2016 10/22/2015 06/13/2015  Decreased Interest 0 1 0 0 0  Down, Depressed, Hopeless 0 1 0 0 0  PHQ - 2 Score 0 2 0 0 0  Altered sleeping - 0 - - -  Tired, decreased energy - 1 - - -  Change in appetite - 0 - - -  Feeling bad or failure about yourself  - 0 - - -  Trouble concentrating - 0 - - -  Moving slowly or fidgety/restless - 0 - - -  Suicidal thoughts - 0 - - -  PHQ-9 Score - 3 - - -   Fall Risk  08/11/2017 01/20/2017 07/10/2016 10/22/2015 06/13/2015  Falls in the past year? No No No No No        Assessment & Plan:   1. Essential  hypertension   2. Hyperlipidemia with target LDL less than 100   3. Anxiety state   4. Urinary frequency   5. Urge incontinence   6. Primary osteoarthritis of left hip   7. Primary osteoarthritis of right hip   8. Metabolic syndrome   9. Bilateral edema of lower extremity     Controlled chronic medical conditions; obtain labs; refills provided. Worsening urge incontinence: refer to urology; obtain u/a with urine culture. S/p total hip replacement: doing very well.    Orders Placed This Encounter  Procedures  . Urine Culture  . CBC with Differential/Platelet  . Comprehensive metabolic panel    Order Specific Question:   Has the patient fasted?    Answer:   No  . Lipid panel    Order Specific Question:   Has the patient fasted?    Answer:   No  . Urine Microscopic  . Ambulatory referral to Urology    Referral Priority:   Routine    Referral Type:   Consultation    Referral Reason:   Specialty Services Required    Requested Specialty:   Urology    Number of Visits Requested:   1  . POCT urinalysis dipstick   Meds ordered this encounter  Medications  . DISCONTD: pravastatin (PRAVACHOL) 40 MG tablet    Sig: Take 2 tablets (80 mg total) by mouth daily.    Dispense:  180 tablet    Refill:  3  . DISCONTD: LORazepam (ATIVAN) 0.5 MG tablet    Sig: Take 1 tablet (0.5 mg total) by mouth at bedtime as needed. for anxiety    Dispense:  30 tablet    Refill:  0  Not to exceed 5 additional fills before 07/14/2017.    Return in about 6 months (around 02/11/2018) for complete physical examiniation STALLINGS.   Fynley Chrystal Paulita Fujita, M.D. Primary Care at [facility] previously [facility] [address] [place], [place]  [address] [phone] phone [phone] fax

## 2017-08-12 LAB — COMPREHENSIVE METABOLIC PANEL
ALK PHOS: 88 IU/L (ref 39–117)
ALT: 12 IU/L (ref 0–32)
AST: 22 IU/L (ref 0–40)
Albumin/Globulin Ratio: 1.6 (ref 1.2–2.2)
Albumin: 4.4 g/dL (ref 3.5–4.8)
BUN/Creatinine Ratio: 19 (ref 12–28)
BUN: 14 mg/dL (ref 8–27)
Bilirubin Total: 0.3 mg/dL (ref 0.0–1.2)
CO2: 27 mmol/L (ref 20–29)
Calcium: 9.8 mg/dL (ref 8.7–10.3)
Chloride: 102 mmol/L (ref 96–106)
Creatinine, Ser: 0.72 mg/dL (ref 0.57–1.00)
GFR calc Af Amer: 95 mL/min/{1.73_m2} (ref >59)
GFR calc non Af Amer: 83 mL/min/{1.73_m2} (ref >59)
GLOBULIN, TOTAL: 2.8 g/dL (ref 1.5–4.5)
Glucose: 85 mg/dL (ref 65–99)
POTASSIUM: 4.2 mmol/L (ref 3.5–5.2)
SODIUM: 143 mmol/L (ref 134–144)
Total Protein: 7.2 g/dL (ref 6.0–8.5)

## 2017-08-12 LAB — LIPID PANEL
CHOLESTEROL TOTAL: 240 mg/dL — AB (ref 100–199)
Chol/HDL Ratio: 2.7 ratio (ref 0.0–4.4)
HDL: 89 mg/dL (ref >39)
LDL Calculated: 136 mg/dL — ABNORMAL HIGH (ref 0–99)
TRIGLYCERIDES: 75 mg/dL (ref 0–149)
VLDL Cholesterol Cal: 15 mg/dL (ref 5–40)

## 2017-08-12 LAB — URINE CULTURE: ORGANISM ID, BACTERIA: NO GROWTH

## 2017-08-12 LAB — URINALYSIS, MICROSCOPIC ONLY: CASTS: NONE SEEN /LPF

## 2017-08-12 LAB — CBC WITH DIFFERENTIAL/PLATELET
BASOS ABS: 0 10*3/uL (ref 0.0–0.2)
Basos: 1 %
EOS (ABSOLUTE): 0.2 10*3/uL (ref 0.0–0.4)
EOS: 3 %
HEMATOCRIT: 36.7 % (ref 34.0–46.6)
Hemoglobin: 11.9 g/dL (ref 11.1–15.9)
Immature Grans (Abs): 0 10*3/uL (ref 0.0–0.1)
Immature Granulocytes: 0 %
Lymphocytes Absolute: 2.4 10*3/uL (ref 0.7–3.1)
Lymphs: 42 %
MCH: 30.1 pg (ref 26.6–33.0)
MCHC: 32.4 g/dL (ref 31.5–35.7)
MCV: 93 fL (ref 79–97)
MONOS ABS: 0.5 10*3/uL (ref 0.1–0.9)
Monocytes: 8 %
NEUTROS PCT: 46 %
Neutrophils Absolute: 2.7 10*3/uL (ref 1.4–7.0)
PLATELETS: 280 10*3/uL (ref 150–379)
RBC: 3.95 x10E6/uL (ref 3.77–5.28)
RDW: 14.2 % (ref 12.3–15.4)
WBC: 5.8 10*3/uL (ref 3.4–10.8)

## 2017-08-16 DIAGNOSIS — Z96642 Presence of left artificial hip joint: Secondary | ICD-10-CM | POA: Diagnosis not present

## 2017-08-16 DIAGNOSIS — Z96641 Presence of right artificial hip joint: Secondary | ICD-10-CM | POA: Diagnosis not present

## 2017-08-16 DIAGNOSIS — M25552 Pain in left hip: Secondary | ICD-10-CM | POA: Diagnosis not present

## 2017-08-16 DIAGNOSIS — Z96651 Presence of right artificial knee joint: Secondary | ICD-10-CM | POA: Diagnosis not present

## 2017-09-08 ENCOUNTER — Encounter: Payer: Self-pay | Admitting: Family Medicine

## 2017-09-16 ENCOUNTER — Ambulatory Visit: Payer: Medicare Other | Admitting: Cardiology

## 2017-09-23 DIAGNOSIS — R3912 Poor urinary stream: Secondary | ICD-10-CM | POA: Diagnosis not present

## 2017-09-23 DIAGNOSIS — R350 Frequency of micturition: Secondary | ICD-10-CM | POA: Diagnosis not present

## 2017-09-23 DIAGNOSIS — R338 Other retention of urine: Secondary | ICD-10-CM | POA: Diagnosis not present

## 2017-09-27 ENCOUNTER — Other Ambulatory Visit: Payer: Self-pay | Admitting: Family Medicine

## 2017-10-01 DIAGNOSIS — R338 Other retention of urine: Secondary | ICD-10-CM | POA: Diagnosis not present

## 2017-10-01 DIAGNOSIS — M6281 Muscle weakness (generalized): Secondary | ICD-10-CM | POA: Diagnosis not present

## 2017-10-01 DIAGNOSIS — R350 Frequency of micturition: Secondary | ICD-10-CM | POA: Diagnosis not present

## 2017-10-01 DIAGNOSIS — R3912 Poor urinary stream: Secondary | ICD-10-CM | POA: Diagnosis not present

## 2017-10-01 DIAGNOSIS — M62838 Other muscle spasm: Secondary | ICD-10-CM | POA: Diagnosis not present

## 2017-10-04 ENCOUNTER — Encounter: Payer: Self-pay | Admitting: Cardiology

## 2017-10-04 ENCOUNTER — Ambulatory Visit (INDEPENDENT_AMBULATORY_CARE_PROVIDER_SITE_OTHER): Payer: MEDICARE | Admitting: Cardiology

## 2017-10-04 VITALS — BP 104/66 | HR 67 | Ht 60.0 in | Wt 199.2 lb

## 2017-10-04 DIAGNOSIS — E785 Hyperlipidemia, unspecified: Secondary | ICD-10-CM

## 2017-10-04 DIAGNOSIS — Z6836 Body mass index (BMI) 36.0-36.9, adult: Secondary | ICD-10-CM | POA: Diagnosis not present

## 2017-10-04 DIAGNOSIS — R600 Localized edema: Secondary | ICD-10-CM

## 2017-10-04 DIAGNOSIS — E8881 Metabolic syndrome: Secondary | ICD-10-CM | POA: Diagnosis not present

## 2017-10-04 DIAGNOSIS — I10 Essential (primary) hypertension: Secondary | ICD-10-CM

## 2017-10-04 MED ORDER — ROSUVASTATIN CALCIUM 40 MG PO TABS: 40.0000 mg | ORAL_TABLET | Freq: Every day | ORAL | 6 refills | Status: DC

## 2017-10-04 NOTE — Patient Instructions (Addendum)
MEDICATION CHANGES  START TO CRESTOR - 40 MG ONE TABLET  STOP PRAVSTATIN  STOP ASPIRIN 325 MG -> START 81 MG   Your physician wants you to follow-up in 6 MONTHS WITH DR HARDING. You will receive a reminder letter in the mail two months in advance. If you don't receive a letter, please call our office to schedule the follow-up appointment.   If you need a refill on your cardiac medications before your next appointment, please call your pharmacy.

## 2017-10-04 NOTE — Progress Notes (Signed)
PCP: Ethelda Chick, MD  Clinic Note: Chief Complaint  Patient presents with  . Follow-up    No complaint. Happy about weight loss    HPI: [name] is a 77 y.o. female with a PMH below who presents today for 2 yr f/u.    She is a morbidly obese woman who I have been following for several years for hypertension and hyperlipidemia.    Cardiac evaluation in the past have been negative.  ELYSSE [name] was last seen in April 2017 This is part of preop evaluation for hip surgeries which she is finally has now done -had to lose weight prior to doing so.-   Recent Hospitalizations:  - December - L hip Arthroplasty.  Studies Personally Reviewed - (if available, images/films reviewed: From Epic Chart or Care Everywhere)  none  Interval History: [name] returns today for long follow-up doing very well.  She is in great spirits great mood.  She is very happy about her weight loss.  She is also very happy to have had both of her hips done.  She is now able to walk without significant pain and therefore is able to get some more exercise.  Unfortunately she is also having some bladder issues and GERD that have been keeping her from being as active as she would like to be.  From a cardiac standpoint she is totally asymptomatic.  No chest tightness pressure discomfort  at rest or with exertion. No PND, orthopnea - edema is usually responsive to elevating her feet.  Is taking PRN Lasix. No palpitations, lightheadedness, dizziness, weakness or syncope/near syncope. No TIA/amaurosis fugax symptoms. No claudication.  ROS: A comprehensive was performed. Review of Systems  Constitutional: Positive for weight loss. Negative for malaise/fatigue.  HENT: Negative for nosebleeds.   Respiratory: Negative for shortness of breath.   Gastrointestinal: Negative for blood in stool and melena.  Genitourinary: Negative for hematuria.  Musculoskeletal: Positive for back pain and joint pain.    Neurological: Negative for dizziness and focal weakness.  Endo/Heme/Allergies: Does not bruise/bleed easily.  Psychiatric/Behavioral: Negative.   All other systems reviewed and are negative.  I have reviewed and (if needed) personally updated the patient's problem list, medications, allergies, past medical and surgical history, social and family history.   Past Medical History:  Diagnosis Date  . Anemia    on iron  . Anxiety   . Arthritis    shoulders, knees, hands, hips; status post left knee surgery  . Chronic bronchitis (HCC)    CXR 07/08/16  . Constipation   . Depression   . GERD (gastroesophageal reflux disease)    TAKES TUMS  . H/O: pneumonia 2003  . Heart murmur    ???   YEARS AGO; denies    . Hyperlipidemia    on med  . Hypertension   . Morbid obesity with BMI of 45.0-49.9, adult (HCC)   . Pneumonia    15-75YRS  . Shingles    recent 02/2017  . Thoracic aortic atherosclerosis (HCC)    CXR 07/08/16    Past Surgical History:  Procedure Laterality Date  . CARDIAC CATHETERIZATION  04/20/1996   patent coronaries, EF 74% (Dr. Aram Candela)  . CARPAL TUNNEL RELEASE Left   . CATARACT EXTRACTION, BILATERAL    . COLONOSCOPY    . KNEE SURGERY Left 11/2001  . REPLACEMENT TOTAL KNEE Left 10/2009  . REPLACEMENT TOTAL KNEE Left 10/2009  . SHOULDER OPEN ROTATOR CUFF REPAIR  03/01/2012  Procedure: ROTATOR CUFF REPAIR SHOULDER OPEN;  Surgeon: Mable Paris, MD;  Location: [facility] OR;  Service: Orthopedics;  Laterality: Left;  SUBSCAPULARIS REPAIR  . STRESS TEST - ADENOSINE THALLIUM  1997   apical and inferior segments with questionable infarct and ischemia in anteroseptal & inferoseptal segments (Dr. Standley Brooking)  . SVD     x 7  . TOTAL HIP ARTHROPLASTY Right 07/24/2016  . TOTAL HIP ARTHROPLASTY Right 07/24/2016   Procedure: RIGHT TOTAL HIP ARTHROPLASTY ANTERIOR APPROACH;  Surgeon: [name], MD;  Location: [facility] OR;  Service: Orthopedics;  Laterality: Right;  . TOTAL HIP  ARTHROPLASTY Left 04/09/2017   Procedure: LEFT TOTAL HIP ARTHROPLASTY ANTERIOR APPROACH;  Surgeon: [name], MD;  Location: [facility] ORS;  Service: Orthopedics;  Laterality: Left;  . TOTAL SHOULDER ARTHROPLASTY  01/18/2012   Procedure: TOTAL SHOULDER ARTHROPLASTY;  Surgeon: Mable Paris, MD;  Location: [facility] OR;  Service: Orthopedics;  Laterality: Left;  . TOTAL SHOULDER REPLACEMENT Left 01/19/2012  . TRANSTHORACIC ECHOCARDIOGRAM  06/2011   LV mildly dilated, EF=>55%; mild mitral annular calcif, mild MR; mild TR; AV mildly sclerotic, mild aortic regurg  . TUBAL LIGATION    . WISDOM TOOTH EXTRACTION      Current Meds  Medication Sig  . aspirin EC 325 MG EC tablet Take 1 tablet (325 mg total) by mouth 2 (two) times daily after a meal. Tate 1 month postop to decrease risk of blood clots. (Patient taking differently: Take 325 mg by mouth 2 (two) times daily after a meal. Tate 1 month postop to decrease risk of blood clots. Told to reduce to 81 mg 10/03/2017)  . calcium carbonate (TUMS - DOSED IN MG ELEMENTAL CALCIUM) 500 MG chewable tablet Chew 2-4 tablets by mouth 2 (two) times daily as needed for indigestion or heartburn.  . docusate sodium (COLACE) 100 MG capsule Take 1 capsule (100 mg total) by mouth 2 (two) times daily. (Patient taking differently: Take 100 mg by mouth daily. )  . Ferrous Sulfate (IRON) 325 (65 Fe) MG TABS Take 1 tablet by mouth every morning.  Marland Kitchen lisinopril (PRINIVIL,ZESTRIL) 20 MG tablet TAKE 1 TABLET BY MOUTH EVERY DAY  . LORazepam (ATIVAN) 0.5 MG tablet TAKE 1 TABLET (0.5 MG TOTAL) BY MOUTH AT BEDTIME AS NEEDED FOR ANXIETY  . [DISCONTINUED] pravastatin (PRAVACHOL) 40 MG tablet Take 2 tablets (80 mg total) by mouth daily.    Allergies  Allergen Reactions  . Effexor [Venlafaxine Hcl] Swelling and Other (See Comments)    MD ADVISES PATIENT TO NOT TAKE IN FUTURE PT STOPPED, MD DC'd MED  . Sulfa Drugs Cross Reactors Itching    Social History   Tobacco Use  .  Smoking status: Former Smoker    Packs/day: 1.00    Years: 25.00    Pack years: 25.00    Types: Cigarettes    Last attempt to quit: 03/07/1984    Years since quitting: 33.6  . Smokeless tobacco: Former Engineer, water Use Topics  . Alcohol use: No  . Drug use: No   Social History   Social History Narrative   Marital status:   She is a widowed since 2009; married 45 years      Children:   mother of 7, grandmother of 5, great-grandmother of 6.      Lives: alone with home; daughter, son lives with patient      Tobacco: quit smoking 34 years ago      Alcohol: none      ADLs:  Walks with cane; drives.     She does not drink alcohol or smoke cigarettes.   She previously tried exercise routinely but is not exercising as much lately. Her excuse is do to knee pain and back pain as well as cold weather.    family history includes Heart disease in her father, mother, sister, sister, and sister; Hypertension in her sister, sister, and sister.  Wt Readings from Last 3 Encounters:  10/04/17 199 lb 3.2 oz (90.4 kg)  08/11/17 192 lb (87.1 kg)  04/09/17 182 lb (82.6 kg)   2017 - 234 lb  PHYSICAL EXAM BP 104/66   Pulse 67   Ht 5' (1.524 m)   Wt 199 lb 3.2 oz (90.4 kg)   BMI 38.90 kg/m  Physical Exam  Constitutional: She is oriented to person, place, and time. She appears well-developed and well-nourished. No distress.  ~ borderline morbidly obese - but notably lighter than las visit. -- mostly below the waist  HENT:  Head: Normocephalic and atraumatic.  Neck: Normal range of motion. Neck supple. No hepatojugular reflux and no JVD present. Carotid bruit is not present.  Cardiovascular: Normal rate, regular rhythm, normal heart sounds, intact distal pulses and normal pulses.  No extrasystoles are present. PMI is not displaced. Exam reveals no gallop and no friction rub.  No murmur heard. Pulmonary/Chest: Effort normal and breath sounds normal. No respiratory distress. She has no wheezes.  She has no rales.  Abdominal: Soft. Bowel sounds are normal. She exhibits no distension. There is no tenderness. There is no rebound.  Unable to palpate HSM  Musculoskeletal: Normal range of motion. She exhibits edema (L> R ~1-2 + (difficult to tell edema from adiposity.).  Neurological: She is alert and oriented to person, place, and time.  Psychiatric: She has a normal mood and affect. Her behavior is normal. Judgment and thought content normal.  Vitals reviewed.   Adult ECG Report  Rate: 67 ;  Rhythm: normal sinus rhythm and non-specific ST-T changes;   Narrative Interpretation: stable   Other studies Reviewed: Additional studies/ records that were reviewed today include:  Recent Labs:    Lab Results  Component Value Date   CREATININE 0.72 08/11/2017   BUN 14 08/11/2017   NA 143 08/11/2017   K 4.2 08/11/2017   CL 102 08/11/2017   CO2 27 08/11/2017   Lab Results  Component Value Date   CHOL 240 (H) 08/11/2017   HDL 89 08/11/2017   LDLCALC 136 (H) 08/11/2017   TRIG 75 08/11/2017   CHOLHDL 2.7 08/11/2017   Lab Results  Component Value Date   HGBA1C 5.8 05/01/2012    ASSESSMENT / PLAN: Problem List Items Addressed This Visit    Metabolic syndrome (Chronic)    With obesity and hypertension as well as dyslipidemia, metabolic syndrome is high risk for cardiac etiology. Need to get LDL down below 100.  Continue weight loss.  Continue blood pressure control.      Relevant Orders   EKG 12-Lead (Completed)   Hyperlipidemia with target LDL less than 100 (Chronic)    She is taking 80 mg of pravastatin and her LDL is 136.  Goal is at least less than 100 if not better. Plan: Convert from pravastatin to Crestor. -- SHOULD have recheck of Lipid panel prior to f/u visit.      Relevant Medications   rosuvastatin (CRESTOR) 40 MG tablet   Essential hypertension (Chronic)    Well-controlled on ACE inhibitor.  Relevant Medications   rosuvastatin (CRESTOR) 40 MG tablet    Other Relevant Orders   EKG 12-Lead (Completed)   Class 2 severe obesity due to excess calories with serious comorbidity and body mass index (BMI) of 36.0 to 36.9 in adult [facility]) (Chronic)    Notable weight loss.  Doing much better.  Continue to stay active.  Better since hip surgery.      Bilateral edema of lower extremity - Primary (Chronic)   Relevant Orders   EKG 12-Lead (Completed)      Current medicines are reviewed at length with the patient today.  (+/- concerns) none The following changes have been made:  none  Patient Instructions  MEDICATION CHANGES  START TO CRESTOR - 40 MG ONE TABLET  STOP PRAVSTATIN  STOP ASPIRIN 325 MG -> START 81 MG   Your physician wants you to follow-up in 6 MONTHS WITH DR HARDING. You will receive a reminder letter in the mail two months in advance. If you don't receive a letter, please call our office to schedule the follow-up appointment.   If you need a refill on your cardiac medications before your next appointment, please call your pharmacy.     Studies Ordered:   Orders Placed This Encounter  Procedures  . EKG 12-Lead      Bryan Lemma, M.D., M.S. Interventional Cardiologist   Pager # [phone] Phone # [phone] [address] [place], [place] [address]   Thank you for choosing [facility] at [facility]!!

## 2017-10-06 ENCOUNTER — Encounter: Payer: Self-pay | Admitting: Cardiology

## 2017-10-06 NOTE — Assessment & Plan Note (Signed)
Well-controlled on ACE inhibitor. 

## 2017-10-06 NOTE — Assessment & Plan Note (Signed)
With obesity and hypertension as well as dyslipidemia, metabolic syndrome is high risk for cardiac etiology. Need to get LDL down below 100.  Continue weight loss.  Continue blood pressure control.

## 2017-10-06 NOTE — Assessment & Plan Note (Signed)
Notable weight loss.  Doing much better.  Continue to stay active.  Better since hip surgery.

## 2017-10-06 NOTE — Assessment & Plan Note (Addendum)
She is taking 80 mg of pravastatin and her LDL is 136.  Goal is at least less than 100 if not better. Plan: Convert from pravastatin to Crestor. -- SHOULD have recheck of Lipid panel prior to f/u visit.

## 2017-10-07 DIAGNOSIS — R338 Other retention of urine: Secondary | ICD-10-CM | POA: Diagnosis not present

## 2017-10-07 DIAGNOSIS — M6281 Muscle weakness (generalized): Secondary | ICD-10-CM | POA: Diagnosis not present

## 2017-10-07 DIAGNOSIS — M62838 Other muscle spasm: Secondary | ICD-10-CM | POA: Diagnosis not present

## 2017-10-07 DIAGNOSIS — R350 Frequency of micturition: Secondary | ICD-10-CM | POA: Diagnosis not present

## 2017-10-11 DIAGNOSIS — M6281 Muscle weakness (generalized): Secondary | ICD-10-CM | POA: Diagnosis not present

## 2017-10-11 DIAGNOSIS — R3912 Poor urinary stream: Secondary | ICD-10-CM | POA: Diagnosis not present

## 2017-10-11 DIAGNOSIS — M62838 Other muscle spasm: Secondary | ICD-10-CM | POA: Diagnosis not present

## 2017-10-11 DIAGNOSIS — R350 Frequency of micturition: Secondary | ICD-10-CM | POA: Diagnosis not present

## 2017-10-11 DIAGNOSIS — R338 Other retention of urine: Secondary | ICD-10-CM | POA: Diagnosis not present

## 2017-10-21 DIAGNOSIS — R3915 Urgency of urination: Secondary | ICD-10-CM | POA: Diagnosis not present

## 2017-10-21 DIAGNOSIS — M6289 Other specified disorders of muscle: Secondary | ICD-10-CM | POA: Diagnosis not present

## 2017-10-21 DIAGNOSIS — M62838 Other muscle spasm: Secondary | ICD-10-CM | POA: Diagnosis not present

## 2017-10-21 DIAGNOSIS — M6281 Muscle weakness (generalized): Secondary | ICD-10-CM | POA: Diagnosis not present

## 2017-10-21 DIAGNOSIS — R339 Retention of urine, unspecified: Secondary | ICD-10-CM | POA: Diagnosis not present

## 2017-10-21 DIAGNOSIS — N3946 Mixed incontinence: Secondary | ICD-10-CM | POA: Diagnosis not present

## 2017-10-27 ENCOUNTER — Ambulatory Visit: Payer: Medicare Other | Admitting: Cardiology

## 2017-11-03 DIAGNOSIS — N8111 Cystocele, midline: Secondary | ICD-10-CM | POA: Diagnosis not present

## 2017-11-08 DIAGNOSIS — N8111 Cystocele, midline: Secondary | ICD-10-CM | POA: Diagnosis not present

## 2017-11-10 DIAGNOSIS — M6289 Other specified disorders of muscle: Secondary | ICD-10-CM | POA: Diagnosis not present

## 2017-11-10 DIAGNOSIS — N3946 Mixed incontinence: Secondary | ICD-10-CM | POA: Diagnosis not present

## 2017-11-10 DIAGNOSIS — R3915 Urgency of urination: Secondary | ICD-10-CM | POA: Diagnosis not present

## 2017-11-10 DIAGNOSIS — M62838 Other muscle spasm: Secondary | ICD-10-CM | POA: Diagnosis not present

## 2017-11-10 DIAGNOSIS — R339 Retention of urine, unspecified: Secondary | ICD-10-CM | POA: Diagnosis not present

## 2017-11-10 DIAGNOSIS — M6281 Muscle weakness (generalized): Secondary | ICD-10-CM | POA: Diagnosis not present

## 2017-11-14 ENCOUNTER — Encounter: Payer: Self-pay | Admitting: Family Medicine

## 2017-11-25 DIAGNOSIS — M62838 Other muscle spasm: Secondary | ICD-10-CM | POA: Diagnosis not present

## 2017-11-25 DIAGNOSIS — N3946 Mixed incontinence: Secondary | ICD-10-CM | POA: Diagnosis not present

## 2017-11-25 DIAGNOSIS — M6289 Other specified disorders of muscle: Secondary | ICD-10-CM | POA: Diagnosis not present

## 2017-11-25 DIAGNOSIS — M6281 Muscle weakness (generalized): Secondary | ICD-10-CM | POA: Diagnosis not present

## 2017-11-25 DIAGNOSIS — R339 Retention of urine, unspecified: Secondary | ICD-10-CM | POA: Diagnosis not present

## 2017-11-25 DIAGNOSIS — N8111 Cystocele, midline: Secondary | ICD-10-CM | POA: Diagnosis not present

## 2017-11-25 DIAGNOSIS — R3915 Urgency of urination: Secondary | ICD-10-CM | POA: Diagnosis not present

## 2017-12-09 DIAGNOSIS — R3915 Urgency of urination: Secondary | ICD-10-CM | POA: Diagnosis not present

## 2017-12-09 DIAGNOSIS — N3946 Mixed incontinence: Secondary | ICD-10-CM | POA: Diagnosis not present

## 2017-12-09 DIAGNOSIS — R339 Retention of urine, unspecified: Secondary | ICD-10-CM | POA: Diagnosis not present

## 2017-12-09 DIAGNOSIS — M6289 Other specified disorders of muscle: Secondary | ICD-10-CM | POA: Diagnosis not present

## 2017-12-09 DIAGNOSIS — M62838 Other muscle spasm: Secondary | ICD-10-CM | POA: Diagnosis not present

## 2017-12-09 DIAGNOSIS — M6281 Muscle weakness (generalized): Secondary | ICD-10-CM | POA: Diagnosis not present

## 2018-01-01 ENCOUNTER — Encounter (HOSPITAL_COMMUNITY): Payer: Self-pay | Admitting: Emergency Medicine

## 2018-01-01 ENCOUNTER — Other Ambulatory Visit: Payer: Self-pay

## 2018-01-01 ENCOUNTER — Emergency Department (HOSPITAL_COMMUNITY)
Admission: EM | Admit: 2018-01-01 | Discharge: 2018-01-01 | Disposition: A | Discharge status: Home / Self Care | Payer: MEDICARE | Attending: Emergency Medicine | Admitting: Emergency Medicine

## 2018-01-01 DIAGNOSIS — Z79899 Other long term (current) drug therapy: Secondary | ICD-10-CM | POA: Diagnosis not present

## 2018-01-01 DIAGNOSIS — F419 Anxiety disorder, unspecified: Secondary | ICD-10-CM | POA: Diagnosis not present

## 2018-01-01 DIAGNOSIS — Z96643 Presence of artificial hip joint, bilateral: Secondary | ICD-10-CM | POA: Insufficient documentation

## 2018-01-01 DIAGNOSIS — R0602 Shortness of breath: Secondary | ICD-10-CM | POA: Diagnosis not present

## 2018-01-01 DIAGNOSIS — R42 Dizziness and giddiness: Secondary | ICD-10-CM | POA: Diagnosis not present

## 2018-01-01 DIAGNOSIS — Z96612 Presence of left artificial shoulder joint: Secondary | ICD-10-CM | POA: Insufficient documentation

## 2018-01-01 DIAGNOSIS — F410 Panic disorder [episodic paroxysmal anxiety] without agoraphobia: Secondary | ICD-10-CM | POA: Insufficient documentation

## 2018-01-01 DIAGNOSIS — R079 Chest pain, unspecified: Secondary | ICD-10-CM | POA: Diagnosis not present

## 2018-01-01 DIAGNOSIS — Z87891 Personal history of nicotine dependence: Secondary | ICD-10-CM | POA: Diagnosis not present

## 2018-01-01 DIAGNOSIS — Z96652 Presence of left artificial knee joint: Secondary | ICD-10-CM | POA: Insufficient documentation

## 2018-01-01 DIAGNOSIS — I10 Essential (primary) hypertension: Secondary | ICD-10-CM | POA: Diagnosis not present

## 2018-01-01 MED ORDER — LORAZEPAM 0.5 MG PO TABS: 0.5000 mg | ORAL_TABLET | Freq: Three times a day (TID) | ORAL | 0 refills | Status: DC | PRN

## 2018-01-01 NOTE — ED Provider Notes (Signed)
[facility]-EMERGENCY DEPT Provider Note   CSN: [id number] Arrival date & time: 01/01/18  4098     History   Chief Complaint Chief Complaint  Patient presents with  . Anxiety    HPI [name] is a 77 y.o. female with history of anxiety and panic attacks, hypertension, GERD who presents following a panic attack.  Patient reports she has been getting panic attacks intermittently since her son died a few years ago.  She reports she has been stressed recently about prolapse of her bladder and she has an appointment this week to discuss interventions for it.  She reports she began feeling stressed about this and had a panic attack.  She has some shortness of breath and change in chest pain she also had a headache and lightheadedness.  All of this has resolved and she is feeling at baseline.  She takes Ativan as needed for anxiety.  Patient's daughter talk to me outside the room and reports that her mother does this occasionally when she wants attention and takes her Ativan daily when she should be taking it as needed.  HPI  Past Medical History:  Diagnosis Date  . Anemia    on iron  . Anxiety   . Arthritis    shoulders, knees, hands, hips; status post left knee surgery  . Chronic bronchitis (HCC)    CXR 07/08/16  . Constipation   . Depression   . GERD (gastroesophageal reflux disease)    TAKES TUMS  . H/O: pneumonia 2003  . Heart murmur    ???   YEARS AGO; denies    . Hyperlipidemia    on med  . Hypertension   . Morbid obesity with BMI of 45.0-49.9, adult (HCC)   . Pneumonia    15-71YRS  . Shingles    recent 02/2017  . Thoracic aortic atherosclerosis (HCC)    CXR 07/08/16    Patient Active Problem List   Diagnosis Date Noted  . Primary osteoarthritis of left hip 01/20/2017  . Primary osteoarthritis of right hip 11/10/2015  . Anxiety state 11/10/2015  . Bilateral edema of lower extremity 07/18/2013    Class: Chronic  . Metabolic syndrome  07/18/2013  . Essential hypertension 07/07/2012  . GERD (gastroesophageal reflux disease) 07/07/2012  . Hyperlipidemia with target LDL less than 100 07/07/2012  . Rotator cuff tear, left 03/02/2012  . Osteoarthritis of left shoulder 01/21/2012    Past Surgical History:  Procedure Laterality Date  . CARDIAC CATHETERIZATION  04/20/1996   patent coronaries, EF 74% (Dr. Aram Candela)  . CARPAL TUNNEL RELEASE Left   . CATARACT EXTRACTION, BILATERAL    . COLONOSCOPY    . KNEE SURGERY Left 11/2001  . REPLACEMENT TOTAL KNEE Left 10/2009  . REPLACEMENT TOTAL KNEE Left 10/2009  . SHOULDER OPEN ROTATOR CUFF REPAIR  03/01/2012   Procedure: ROTATOR CUFF REPAIR SHOULDER OPEN;  Surgeon: Mable Paris, MD;  Location: [facility] OR;  Service: Orthopedics;  Laterality: Left;  SUBSCAPULARIS REPAIR  . STRESS TEST - ADENOSINE THALLIUM  1997   apical and inferior segments with questionable infarct and ischemia in anteroseptal & inferoseptal segments (Dr. Standley Brooking)  . SVD     x 7  . TOTAL HIP ARTHROPLASTY Right 07/24/2016  . TOTAL HIP ARTHROPLASTY Right 07/24/2016   Procedure: RIGHT TOTAL HIP ARTHROPLASTY ANTERIOR APPROACH;  Surgeon: [name], MD;  Location: [facility] OR;  Service: Orthopedics;  Laterality: Right;  . TOTAL HIP ARTHROPLASTY Left 04/09/2017   Procedure:  LEFT TOTAL HIP ARTHROPLASTY ANTERIOR APPROACH;  Surgeon: [name], MD;  Location: [facility] ORS;  Service: Orthopedics;  Laterality: Left;  . TOTAL SHOULDER ARTHROPLASTY  01/18/2012   Procedure: TOTAL SHOULDER ARTHROPLASTY;  Surgeon: Mable Paris, MD;  Location: [facility] OR;  Service: Orthopedics;  Laterality: Left;  . TOTAL SHOULDER REPLACEMENT Left 01/19/2012  . TRANSTHORACIC ECHOCARDIOGRAM  06/2011   LV mildly dilated, EF=>55%; mild mitral annular calcif, mild MR; mild TR; AV mildly sclerotic, mild aortic regurg  . TUBAL LIGATION    . WISDOM TOOTH EXTRACTION       OB History   None      Home Medications    Prior to Admission  medications   Medication Sig Start Date End Date Taking? Authorizing Provider  aspirin EC 325 MG EC tablet Take 1 tablet (325 mg total) by mouth 2 (two) times daily after a meal. Tate 1 month postop to decrease risk of blood clots. Patient taking differently: Take 325 mg by mouth 2 (two) times daily after a meal. Tate 1 month postop to decrease risk of blood clots. Told to reduce to 81 mg 10/03/2017 04/12/17   Marshia Ly, PA-C  calcium carbonate (TUMS - DOSED IN MG ELEMENTAL CALCIUM) 500 MG chewable tablet Chew 2-4 tablets by mouth 2 (two) times daily as needed for indigestion or heartburn.    [provider]  docusate sodium (COLACE) 100 MG capsule Take 1 capsule (100 mg total) by mouth 2 (two) times daily. Patient taking differently: Take 100 mg by mouth daily.  07/24/16   Marshia Ly, PA-C  Ferrous Sulfate (IRON) 325 (65 Fe) MG TABS Take 1 tablet by mouth every morning.    [provider]  lisinopril (PRINIVIL,ZESTRIL) 20 MG tablet TAKE 1 TABLET BY MOUTH EVERY DAY 07/21/17   Ethelda Chick, MD  LORazepam (ATIVAN) 0.5 MG tablet Take 1 tablet (0.5 mg total) by mouth every 8 (eight) hours as needed for anxiety. for anxiety 01/01/18   Emi Holes, PA-C  rosuvastatin (CRESTOR) 40 MG tablet Take 1 tablet (40 mg total) by mouth daily. 10/04/17 01/02/18  [name], MD    Family History Family History  Problem Relation Age of Onset  . Heart disease Mother   . Heart disease Father   . Heart disease Sister   . Hypertension Sister   . Heart disease Sister   . Hypertension Sister   . Heart disease Sister   . Hypertension Sister     Social History Social History   Tobacco Use  . Smoking status: Former Smoker    Packs/day: 1.00    Years: 25.00    Pack years: 25.00    Types: Cigarettes    Last attempt to quit: 03/07/1984    Years since quitting: 33.8  . Smokeless tobacco: Former Engineer, water Use Topics  . Alcohol use: No  . Drug use: No      Allergies   Effexor [venlafaxine hcl] and Sulfa drugs cross reactors   Review of Systems Review of Systems  Constitutional: Negative for chills and fever.  HENT: Negative for facial swelling and sore throat.   Respiratory: Positive for shortness of breath.   Cardiovascular: Positive for chest pain.  Gastrointestinal: Negative for abdominal pain, nausea and vomiting.  Genitourinary: Negative for dysuria.  Musculoskeletal: Negative for back pain.  Skin: Negative for rash and wound.  Neurological: Positive for light-headedness and headaches.  Psychiatric/Behavioral: The patient is not nervous/anxious.      Physical Exam  Updated Vital Signs BP (!) 154/95 (BP Location: Left Arm)   Pulse (!) 58   Temp 97.8 F (36.6 C) (Oral)   Resp 18   SpO2 96%   Physical Exam  Constitutional: She appears well-developed and well-nourished. No distress.  HENT:  Head: Normocephalic and atraumatic.  Mouth/Throat: Oropharynx is clear and moist. No oropharyngeal exudate.  Eyes: Pupils are equal, round, and reactive to light. Conjunctivae are normal. Right eye exhibits no discharge. Left eye exhibits no discharge. No scleral icterus.  Neck: Normal range of motion. Neck supple. No thyromegaly present.  Cardiovascular: Normal rate, regular rhythm, normal heart sounds and intact distal pulses. Exam reveals no gallop and no friction rub.  No murmur heard. Pulmonary/Chest: Effort normal and breath sounds normal. No stridor. No respiratory distress. She has no wheezes. She has no rales.  Abdominal: Soft. Bowel sounds are normal. She exhibits no distension. There is no tenderness. There is no rebound and no guarding.  Musculoskeletal: She exhibits no edema.  Lymphadenopathy:    She has no cervical adenopathy.  Neurological: She is alert. Coordination normal.  Skin: Skin is warm and dry. No rash noted. She is not diaphoretic. No pallor.  Psychiatric: She has a normal mood and affect.  Nursing note  and vitals reviewed.    ED Treatments / Results  Labs (all labs ordered are listed, but only abnormal results are displayed) Labs Reviewed - No data to display  EKG None  Radiology No results found.  Procedures Procedures (including critical care time)  Medications Ordered in ED Medications - No data to display   Initial Impression / Assessment and Plan / ED Course  I have reviewed the triage vital signs and the nursing notes.  Pertinent labs & imaging results that were available during my care of the patient were reviewed by me and considered in my medical decision making (see chart for details).     Patient presenting after panic attack.  Patient has returned to baseline.  She reports she occasionally gets chest pain and shortness of breath with her panic attacks.  She has been stressed about her pending bladder surgery discussion this week.  Will discharge home with very short course of lorazepam, as patient has been out and can cannot get a refill for couple weeks.  Patient counseled on when to use her lorazepam, only as needed.  Follow-up to PCP for further refills.  Patient and daughter understand and agree with plan.  Patient vitals stable throughout ED course and discharged in satisfactory condition. Patient also evaluated by Dr. Estell HarpinZammit who agrees with plan.  Final Clinical Impressions(s) / ED Diagnoses   Final diagnoses:  Anxiety attack    ED Discharge Orders         Ordered    LORazepam (ATIVAN) 0.5 MG tablet  Every 8 hours PRN    Note to Pharmacy:  Not to exceed 5 additional fills before 02/07/2018.   01/01/18 0713           Emi HolesLaw, Boniface Goffe M, PA-C 01/01/18 [id number]    Bethann BerkshireZammit, Joseph, MD 01/01/18 1558

## 2018-01-01 NOTE — Discharge Instructions (Signed)
Take Ativan every 8 hours only as needed for anxiety or panic attacks.  Please follow-up with your doctor as scheduled for further management of this condition.  Please return the emergency department if you develop any new or worsening symptoms.

## 2018-01-01 NOTE — ED Triage Notes (Signed)
Pt presents s/p panic attack. Patient states that she is having a bladder surgery on Thursday and it has been making her anxious. Patient complaining of some dizziness and a slight headache.

## 2018-01-06 DIAGNOSIS — N8111 Cystocele, midline: Secondary | ICD-10-CM | POA: Diagnosis not present

## 2018-01-06 DIAGNOSIS — R338 Other retention of urine: Secondary | ICD-10-CM | POA: Diagnosis not present

## 2018-01-08 ENCOUNTER — Encounter: Payer: Self-pay | Admitting: Family Medicine

## 2018-01-08 ENCOUNTER — Ambulatory Visit (INDEPENDENT_AMBULATORY_CARE_PROVIDER_SITE_OTHER): Payer: MEDICARE | Admitting: Family Medicine

## 2018-01-08 ENCOUNTER — Other Ambulatory Visit: Payer: Self-pay

## 2018-01-08 VITALS — BP 132/85 | HR 63 | Temp 97.8°F | Resp 16 | Ht 60.0 in | Wt 197.0 lb

## 2018-01-08 DIAGNOSIS — F4329 Adjustment disorder with other symptoms: Secondary | ICD-10-CM

## 2018-01-08 DIAGNOSIS — F419 Anxiety disorder, unspecified: Secondary | ICD-10-CM | POA: Diagnosis not present

## 2018-01-08 DIAGNOSIS — F410 Panic disorder [episodic paroxysmal anxiety] without agoraphobia: Secondary | ICD-10-CM

## 2018-01-08 DIAGNOSIS — F32A Depression, unspecified: Secondary | ICD-10-CM

## 2018-01-08 DIAGNOSIS — I10 Essential (primary) hypertension: Secondary | ICD-10-CM

## 2018-01-08 DIAGNOSIS — F329 Major depressive disorder, single episode, unspecified: Secondary | ICD-10-CM

## 2018-01-08 DIAGNOSIS — Z23 Encounter for immunization: Secondary | ICD-10-CM | POA: Diagnosis not present

## 2018-01-08 DIAGNOSIS — F4321 Adjustment disorder with depressed mood: Secondary | ICD-10-CM

## 2018-01-08 DIAGNOSIS — Z634 Disappearance and death of family member: Secondary | ICD-10-CM

## 2018-01-08 MED ORDER — HYDROXYZINE HCL 25 MG PO TABS: 25.0000 mg | ORAL_TABLET | Freq: Three times a day (TID) | ORAL | 1 refills | Status: DC | PRN

## 2018-01-08 MED ORDER — LISINOPRIL 20 MG PO TABS: 20.0000 mg | ORAL_TABLET | Freq: Every day | ORAL | 1 refills | Status: DC

## 2018-01-08 MED ORDER — ROSUVASTATIN CALCIUM 40 MG PO TABS: 40.0000 mg | ORAL_TABLET | Freq: Every day | ORAL | 1 refills | Status: DC

## 2018-01-08 NOTE — Patient Instructions (Addendum)
If you have lab work done today you will be contacted with your lab results within the next 2 weeks.  If you have not heard from [place] then please contact us. The fastest way to get your results is to register for My Chart.   IF you received an x-ray today, you will receive an invoice from [facility]. Please contact [facility] at [phone] with questions or concerns regarding your invoice.   IF you received labwork today, you will receive an invoice from [facility]. Please contact [facility] at [phone] with questions or concerns regarding your invoice.   Our billing staff will not be able to assist you with questions regarding bills from these companies.  You will be contacted with the lab results as soon as they are available. The fastest way to get your results is to activate your My Chart account. Instructions are located on the last page of this paperwork. If you have not heard from [place] regarding the results in 2 weeks, please contact this office.     Complicated Grieving Grief is a normal response to the death of someone close to you. Feelings of fear, anger, and guilt can affect almost everyone who loses a loved one. It is also common to have symptoms of depression while you are grieving. These include problems with sleep, loss of appetite, and lack of energy. They may last for weeks or months after a loss. Complicated grief is different from normal grief or depression. Normal grieving involves sadness and feelings of loss, but these feelings are not constant. Complicated grief is a constant and severe type of grief. It interferes with your ability to function normally. It may last for several months to a year or longer. Complicated grief may require treatment from a mental health care provider. What are the causes? It is not known why some people continue to struggle with grief and others do not. You may be at higher risk for complicated grief if:  The death of  your loved one was sudden or unexpected.  The death of your loved one was due to a violent event.  Your loved one committed suicide.  Your loved one was a child or a young person.  You were very close to or dependent on the loved one.  You have a history of depression.  What are the signs or symptoms? Signs and symptoms of complicated grief may include:  Feeling disbelief or numbness.  Being unable to enjoy good memories of your loved one.  Needing to avoid anything that reminds you of your loved one.  Being unable to stop thinking about the death.  Feeling intense anger or guilt.  Feeling alone and hopeless.  Feeling that your life is meaningless and empty.  Losing the desire to live.  How is this diagnosed? Your health care provider may diagnose complicated grief if:  You have constant symptoms of grief for 6-12 months or longer.  Your symptoms are interfering with your ability to live your life.  Your health care provider may want you to see a mental health care provider. Many symptoms of depression are similar to the symptoms of complicated grief. It is important to be evaluated for complicated grief along with other mental health conditions. How is this treated? Talk therapy with a mental health provider is the most common treatment for complicated grief. During therapy, you will learn healthy ways to cope with the loss of your loved one. In some cases, your mental health  care provider may also recommend antidepressant medicines. Follow these instructions at home:  Take care of yourself. ? Eat regular meals and maintain a healthy diet. Eat plenty of fruits, vegetables, and whole grains. ? Try to get some exercise each day. ? Keep regular hours for sleep. Try to get at least 8 hours of sleep each night.  Do not use drugs or alcohol to ease your symptoms.  Take medicines only as directed by your health care provider.  Spend time with friends and loved  ones.  Consider joining a grief (bereavement) support group to help you deal with your loss.  Keep all follow-up visits as directed by your health care provider. This is important. Contact a health care provider if:  Your symptoms keep you from functioning normally.  Your symptoms do not get better with treatment. Get help right away if:  You have serious thoughts of hurting yourself or someone else.  You have suicidal feelings. This information is not intended to replace advice given to you by your health care provider. Make sure you discuss any questions you have with your health care provider. Document Released: 03/30/2005 Document Revised: 09/05/2015 Document Reviewed: 09/07/2013 Elsevier Interactive Patient Education  [employer].

## 2018-01-08 NOTE — Progress Notes (Signed)
Chief Complaint  Patient presents with  . mental status asessment and medication refill, ? referral to    Daughter has concerns  that lorazepam  is not helping, per daughter mother doesn't understand prn and is taking the lorazepam daily and has now ran out of medication.  Panic attacks are severe, per daughter pt needs grief counseling due to loss o f husband and son.  ? psychologist or psychiatry referral.  Early onset of dementia and alzheimers.  . Medication Refill    lisinopril, lorazepam, and rosuvastatin    HPI  Pt reports that she was in the ER for panic attacks She states that her bp has been higher She reports that she will need bladder surgery  She reports that she saw Dr. Alvester Morin regarding her bladder She states that she is anxious about this surgery  She states that she is doing the urine samples at home  BP Readings from Last 3 Encounters:  01/08/18 132/85  01/01/18 (!) 154/95  10/04/17 104/66    She states that the lorazepam makes her feels very sleepy and calms her down She states that she has anxiety about being alone  She drives and takes other people to the doctor - her church members   Past Medical History:  Diagnosis Date  . Anemia    on iron  . Anxiety   . Arthritis    shoulders, knees, hands, hips; status post left knee surgery  . Chronic bronchitis (HCC)    CXR 07/08/16  . Constipation   . Depression   . GERD (gastroesophageal reflux disease)    TAKES TUMS  . H/O: pneumonia 2003  . Heart murmur    ???   YEARS AGO; denies    . Hyperlipidemia    on med  . Hypertension   . Morbid obesity with BMI of 45.0-49.9, adult (HCC)   . Pneumonia    15-65YRS  . Shingles    recent 02/2017  . Thoracic aortic atherosclerosis (HCC)    CXR 07/08/16    Current Outpatient Medications  Medication Sig Dispense Refill  . aspirin EC 325 MG EC tablet Take 1 tablet (325 mg total) by mouth 2 (two) times daily after a meal. Tate 1 month postop to decrease risk of  blood clots. (Patient taking differently: Take 81 mg by mouth daily. Tate 1 month postop to decrease risk of blood clots. Told to reduce to 81 mg 10/03/2017) 60 tablet 0  . calcium carbonate (TUMS - DOSED IN MG ELEMENTAL CALCIUM) 500 MG chewable tablet Chew 2-4 tablets by mouth 2 (two) times daily as needed for indigestion or heartburn.    . docusate sodium (COLACE) 100 MG capsule Take 1 capsule (100 mg total) by mouth 2 (two) times daily. (Patient taking differently: Take 100 mg by mouth daily. ) 30 capsule 0  . Ferrous Sulfate (IRON) 325 (65 Fe) MG TABS Take 1 tablet by mouth every morning.    Marland Kitchen lisinopril (PRINIVIL,ZESTRIL) 20 MG tablet Take 1 tablet (20 mg total) by mouth daily. 90 tablet 1  . hydrOXYzine (ATARAX/VISTARIL) 25 MG tablet Take 1 tablet (25 mg total) by mouth 3 (three) times daily as needed. 90 tablet 1  . rosuvastatin (CRESTOR) 40 MG tablet Take 1 tablet (40 mg total) by mouth daily. 90 tablet 1   No current facility-administered medications for this visit.     Allergies:  Allergies  Allergen Reactions  . Effexor [Venlafaxine Hcl] Swelling and Other (See Comments)    MD ADVISES  PATIENT TO NOT TAKE IN FUTURE PT STOPPED, MD DC'd MED  . Sulfa Drugs Cross Reactors Itching    Past Surgical History:  Procedure Laterality Date  . CARDIAC CATHETERIZATION  04/20/1996   patent coronaries, EF 74% (Dr. Aram Candela)  . CARPAL TUNNEL RELEASE Left   . CATARACT EXTRACTION, BILATERAL    . COLONOSCOPY    . KNEE SURGERY Left 11/2001  . REPLACEMENT TOTAL KNEE Left 10/2009  . REPLACEMENT TOTAL KNEE Left 10/2009  . SHOULDER OPEN ROTATOR CUFF REPAIR  03/01/2012   Procedure: ROTATOR CUFF REPAIR SHOULDER OPEN;  Surgeon: Mable Paris, MD;  Location: [facility] OR;  Service: Orthopedics;  Laterality: Left;  SUBSCAPULARIS REPAIR  . STRESS TEST - ADENOSINE THALLIUM  1997   apical and inferior segments with questionable infarct and ischemia in anteroseptal & inferoseptal segments (Dr. Standley Brooking)    . SVD     x 7  . TOTAL HIP ARTHROPLASTY Right 07/24/2016  . TOTAL HIP ARTHROPLASTY Right 07/24/2016   Procedure: RIGHT TOTAL HIP ARTHROPLASTY ANTERIOR APPROACH;  Surgeon: [name], MD;  Location: [facility] OR;  Service: Orthopedics;  Laterality: Right;  . TOTAL HIP ARTHROPLASTY Left 04/09/2017   Procedure: LEFT TOTAL HIP ARTHROPLASTY ANTERIOR APPROACH;  Surgeon: [name], MD;  Location: [facility] ORS;  Service: Orthopedics;  Laterality: Left;  . TOTAL SHOULDER ARTHROPLASTY  01/18/2012   Procedure: TOTAL SHOULDER ARTHROPLASTY;  Surgeon: Mable Paris, MD;  Location: [facility] OR;  Service: Orthopedics;  Laterality: Left;  . TOTAL SHOULDER REPLACEMENT Left 01/19/2012  . TRANSTHORACIC ECHOCARDIOGRAM  06/2011   LV mildly dilated, EF=>55%; mild mitral annular calcif, mild MR; mild TR; AV mildly sclerotic, mild aortic regurg  . TUBAL LIGATION    . WISDOM TOOTH EXTRACTION      Social History   Socioeconomic History  . Marital status: Widowed    Spouse name: Not on file  . Number of children: 7  . Years of education: Not on file  . Highest education level: Not on file  Occupational History  . Occupation: retired    Associate Professor: RETIRED  Social Needs  . Financial resource strain: Not on file  . Food insecurity:    Worry: Not on file    Inability: Not on file  . Transportation needs:    Medical: Not on file    Non-medical: Not on file  Tobacco Use  . Smoking status: Former Smoker    Packs/day: 1.00    Years: 25.00    Pack years: 25.00    Types: Cigarettes    Last attempt to quit: 03/07/1984    Years since quitting: 33.8  . Smokeless tobacco: Former Engineer, water and Sexual Activity  . Alcohol use: No  . Drug use: No  . Sexual activity: Never    Birth control/protection: Surgical  Lifestyle  . Physical activity:    Days per week: Not on file    Minutes per session: Not on file  . Stress: Not on file  Relationships  . Social connections:    Talks on phone: Not on file    Gets  together: Not on file    Attends religious service: Not on file    Active member of club or organization: Not on file    Attends meetings of clubs or organizations: Not on file    Relationship status: Not on file  Other Topics Concern  . Not on file  Social History Narrative   Marital status:   She is a widowed since  2009; married 45 years      Children:   mother of 22, grandmother of 5, great-grandmother of 6.      Lives: alone with home; daughter, son lives with patient      Tobacco: quit smoking 34 years ago      Alcohol: none      ADLs:  Walks with cane; drives.     She does not drink alcohol or smoke cigarettes.   She previously tried exercise routinely but is not exercising as much lately. Her excuse is do to knee pain and back pain as well as cold weather.    Family History  Problem Relation Age of Onset  . Heart disease Mother   . Heart disease Father   . Heart disease Sister   . Hypertension Sister   . Heart disease Sister   . Hypertension Sister   . Heart disease Sister   . Hypertension Sister      ROS Review of Systems See HPI Constitution: No fevers or chills No malaise No diaphoresis Skin: No rash or itching Eyes: no blurry vision, no double vision GU: no dysuria or hematuria, +incontinence Neuro: no dizziness or headaches all others reviewed and negative   Objective: Vitals:   01/08/18 1223  BP: 132/85  Pulse: 63  Resp: 16  Temp: 97.8 F (36.6 C)  TempSrc: Oral  SpO2: 96%  Weight: 197 lb (89.4 kg)  Height: 5' (1.524 m)    Physical Exam  Constitutional: She is oriented to person, place, and time. She appears well-developed and well-nourished.  HENT:  Head: Normocephalic and atraumatic.  Eyes: Conjunctivae and EOM are normal.  Pulmonary/Chest: Effort normal.  Neurological: She is alert and oriented to person, place, and time.  Psychiatric: Her behavior is normal. Judgment and thought content normal.  Crying throughtout much of the encounter      Assessment and Plan Vandana was seen today for mental status asessment and medication refill, ? referral to and medication refill.  Diagnoses and all orders for this visit:  Complicated grieving - pt continues to grieve the death of her son and her husband but especially her son. She is afraid of being alone. She is afraid of not being needed or wanted Suggested continued grief counseling but also referral to psychology for her anxiety and depression Discussed finding opportunities in the community to volunteer such as a helper at the day cares or church. Consider taking in a roommate once her daughter moves out or downsizing her house. Her daughter [name] is very supportive and there are no signs of neglect or abuse. -     Ambulatory referral to Psychology  Flu vaccine need -     Flu vaccine HIGH DOSE PF (Fluzone High dose)  Panic attack- gave her hydroxyzine and advised discontinuation of the ativan -     Ambulatory referral to Psychology  Anxiety and depression- discussed the benefit of treating her long standing anxiety with a daily medication rather than a as needed ativan (which she is needing daily) -     Ambulatory referral to Psychology  Essential hypertension- Patient's blood pressure is at goal of 139/89 or less. Condition is stable. Continue current medications and treatment plan. I recommend that you exercise for 30-45 minutes 5 days a week. I also recommend a balanced diet with fruits and vegetables every day, lean meats, and little fried foods. The DASH diet (you can find this online) is a good example of this.  Other orders -  hydrOXYzine (ATARAX/VISTARIL) 25 MG tablet; Take 1 tablet (25 mg total) by mouth 3 (three) times daily as needed. -     lisinopril (PRINIVIL,ZESTRIL) 20 MG tablet; Take 1 tablet (20 mg total) by mouth daily. -     rosuvastatin (CRESTOR) 40 MG tablet; Take 1 tablet (40 mg total) by mouth daily.  A total of 40 minutes were spent face-to-face  with the patient during this encounter and over half of that time was spent on counseling and coordination of care.    Mirela Parsley A Javyn Havlin

## 2018-01-11 ENCOUNTER — Telehealth: Payer: Self-pay | Admitting: Family Medicine

## 2018-01-11 NOTE — Telephone Encounter (Signed)
Called and left VM for pt to call and reschedule their appt with Dr. Stallings due to her schedule change. When pt calls back, please reschedule them at their convenience.  ° °Thank you! °

## 2018-01-13 ENCOUNTER — Telehealth: Payer: Self-pay | Admitting: Family Medicine

## 2018-01-13 NOTE — Telephone Encounter (Signed)
Copied from CRM [id number]. Topic: General - Other >> Jan 12, 2018  4:00 PM Jaquita Rector A wrote: Reason for CRM: Patient called to ask Dr Creta Levin if it is ok for her to take hydrOXYzine (ATARAX/VISTARIL) 25 MG tablet at night instead of in the morning because she stated that she want to go to [employer] and [employer]. Stated that taking it during the daytime she is unable to complete these tasks. Asked for a call back to [name] (daughter) [phone]

## 2018-01-13 NOTE — Telephone Encounter (Signed)
Left message on voicemail (daughter Areesha Dehaven per pt request) ok for mom to take the hydroxyzine 25 mg tablet at night.  Advised to contact office with any further questions or concerns. Dgaddy, CMA

## 2018-01-24 DIAGNOSIS — M25551 Pain in right hip: Secondary | ICD-10-CM | POA: Diagnosis not present

## 2018-01-24 DIAGNOSIS — Z96642 Presence of left artificial hip joint: Secondary | ICD-10-CM | POA: Diagnosis not present

## 2018-01-24 DIAGNOSIS — M25552 Pain in left hip: Secondary | ICD-10-CM | POA: Diagnosis not present

## 2018-01-24 DIAGNOSIS — Z96641 Presence of right artificial hip joint: Secondary | ICD-10-CM | POA: Diagnosis not present

## 2018-02-10 ENCOUNTER — Encounter: Payer: MEDICARE | Admitting: Family Medicine

## 2018-02-14 ENCOUNTER — Encounter: Payer: Medicare Other | Admitting: Family Medicine

## 2018-02-19 ENCOUNTER — Encounter: Payer: MEDICARE | Admitting: Family Medicine

## 2018-02-21 ENCOUNTER — Encounter: Payer: Self-pay | Admitting: Family Medicine

## 2018-02-21 ENCOUNTER — Other Ambulatory Visit: Payer: Self-pay | Admitting: Family Medicine

## 2018-02-21 ENCOUNTER — Ambulatory Visit (INDEPENDENT_AMBULATORY_CARE_PROVIDER_SITE_OTHER): Payer: MEDICARE | Admitting: Family Medicine

## 2018-02-21 ENCOUNTER — Other Ambulatory Visit: Payer: Self-pay

## 2018-02-21 VITALS — BP 137/75 | HR 51 | Temp 98.4°F | Resp 16 | Ht 60.0 in | Wt 199.4 lb

## 2018-02-21 DIAGNOSIS — Z5181 Encounter for therapeutic drug level monitoring: Secondary | ICD-10-CM

## 2018-02-21 DIAGNOSIS — F4329 Adjustment disorder with other symptoms: Secondary | ICD-10-CM | POA: Diagnosis not present

## 2018-02-21 DIAGNOSIS — Z1231 Encounter for screening mammogram for malignant neoplasm of breast: Secondary | ICD-10-CM

## 2018-02-21 DIAGNOSIS — F4321 Adjustment disorder with depressed mood: Secondary | ICD-10-CM

## 2018-02-21 DIAGNOSIS — I10 Essential (primary) hypertension: Secondary | ICD-10-CM | POA: Diagnosis not present

## 2018-02-21 DIAGNOSIS — Z634 Disappearance and death of family member: Secondary | ICD-10-CM | POA: Diagnosis not present

## 2018-02-21 DIAGNOSIS — E785 Hyperlipidemia, unspecified: Secondary | ICD-10-CM

## 2018-02-21 NOTE — Patient Instructions (Signed)
° ° ° °  If you have lab work done today you will be contacted with your lab results within the next 2 weeks.  If you have not heard from us then please contact us. The fastest way to get your results is to register for My Chart. ° ° °IF you received an x-ray today, you will receive an invoice from [facility]. Please contact [facility] at [phone] with questions or concerns regarding your invoice.  ° °IF you received labwork today, you will receive an invoice from [facility]. Please contact [facility] at [phone] with questions or concerns regarding your invoice.  ° °Our billing staff will not be able to assist you with questions regarding bills from these companies. ° °You will be contacted with the lab results as soon as they are available. The fastest way to get your results is to activate your My Chart account. Instructions are located on the last page of this paperwork. If you have not heard from us regarding the results in 2 weeks, please contact this office. °  ° ° ° °

## 2018-02-21 NOTE — Progress Notes (Signed)
Chief Complaint  Patient presents with  . medication f/u    needs crestor refill    HPI  Hypertension: Patient here for follow-up of elevated blood pressure. She is not exercising and is adherent to low salt diet.  Blood pressure is well controlled at home. Cardiac symptoms none. Patient denies chest pain, chest pressure/discomfort, claudication, exertional chest pressure/discomfort, fatigue and irregular heart beat.  Cardiovascular risk factors: advanced age (older than 71 for men, 94 for women), dyslipidemia and hypertension. Use of agents associated with hypertension: none. History of target organ damage: none. BP Readings from Last 3 Encounters:  02/21/18 137/75  01/08/18 132/85  01/01/18 (!) 154/95    Grieving She has been dealing with the death of some of her church members She states that she can't keep going to these funerals She denies suicidal thoughts Depression screen [facility] 2/9 01/08/2018 08/11/2017 01/20/2017 07/10/2016 10/22/2015  Decreased Interest 1 0 1 0 0  Down, Depressed, Hopeless 2 0 1 0 0  PHQ - 2 Score 3 0 2 0 0  Altered sleeping 2 - 0 - -  Tired, decreased energy 3 - 1 - -  Change in appetite 0 - 0 - -  Feeling bad or failure about yourself  0 - 0 - -  Trouble concentrating 1 - 0 - -  Moving slowly or fidgety/restless 2 - 0 - -  Suicidal thoughts 0 - 0 - -  PHQ-9 Score 11 - 3 - -  Difficult doing work/chores Very difficult - - - -   Hyperlipidema She is tolerating the crestor without any issues She reports that she watches what she eats She denies any meds that contributed to dyslipidemia  Lab Results  Component Value Date   CHOL 240 (H) 08/11/2017   CHOL 202 (H) 01/20/2017   CHOL 201 (H) 07/10/2016   Lab Results  Component Value Date   HDL 89 08/11/2017   HDL 81 01/20/2017   HDL 78 07/10/2016   Lab Results  Component Value Date   LDLCALC 136 (H) 08/11/2017   LDLCALC 110 (H) 01/20/2017   LDLCALC 111 (H) 07/10/2016   Lab Results  Component  Value Date   TRIG 75 08/11/2017   TRIG 54 01/20/2017   TRIG 60 07/10/2016   Lab Results  Component Value Date   CHOLHDL 2.7 08/11/2017   CHOLHDL 2.5 01/20/2017   CHOLHDL 2.6 07/10/2016   No results found for: LDLDIRECT   4 review of systems  Past Medical History:  Diagnosis Date  . Anemia    on iron  . Anxiety   . Arthritis    shoulders, knees, hands, hips; status post left knee surgery  . Chronic bronchitis ([place])    CXR 07/08/16  . Constipation   . Depression   . GERD (gastroesophageal reflux disease)    TAKES TUMS  . H/O: pneumonia 2003  . Heart murmur    ???   YEARS AGO; denies    . Hyperlipidemia    on med  . Hypertension   . Morbid obesity with BMI of 45.0-49.9, adult ([place])   . Pneumonia    15-72YRS  . Shingles    recent 02/2017  . Thoracic aortic atherosclerosis ([place])    CXR 07/08/16    Current Outpatient Medications  Medication Sig Dispense Refill  . aspirin EC 325 MG EC tablet Take 1 tablet (325 mg total) by mouth 2 (two) times daily after a meal. Tate 1 month postop to decrease risk of blood clots. (  Patient taking differently: Take 81 mg by mouth daily. Tate 1 month postop to decrease risk of blood clots. Told to reduce to 81 mg 10/03/2017) 60 tablet 0  . calcium carbonate (TUMS - DOSED IN MG ELEMENTAL CALCIUM) 500 MG chewable tablet Chew 2-4 tablets by mouth 2 (two) times daily as needed for indigestion or heartburn.    . docusate sodium (COLACE) 100 MG capsule Take 1 capsule (100 mg total) by mouth 2 (two) times daily. (Patient taking differently: Take 100 mg by mouth daily. ) 30 capsule 0  . Ferrous Sulfate (IRON) 325 (65 Fe) MG TABS Take 1 tablet by mouth every morning.    . hydrOXYzine (ATARAX/VISTARIL) 25 MG tablet Take 1 tablet (25 mg total) by mouth 3 (three) times daily as needed. 90 tablet 1  . lisinopril (PRINIVIL,ZESTRIL) 20 MG tablet Take 1 tablet (20 mg total) by mouth daily. 90 tablet 1  . rosuvastatin (CRESTOR) 40 MG tablet Take 1 tablet  (40 mg total) by mouth daily. 90 tablet 1   No current facility-administered medications for this visit.     Allergies:  Allergies  Allergen Reactions  . Effexor [Venlafaxine Hcl] Swelling and Other (See Comments)    MD ADVISES PATIENT TO NOT TAKE IN FUTURE PT STOPPED, MD DC'd MED  . Sulfa Drugs Cross Reactors Itching    Past Surgical History:  Procedure Laterality Date  . CARDIAC CATHETERIZATION  04/20/1996   patent coronaries, EF 74% (Dr. Domenic Moras)  . CARPAL TUNNEL RELEASE Left   . CATARACT EXTRACTION, BILATERAL    . COLONOSCOPY    . KNEE SURGERY Left 11/2001  . REPLACEMENT TOTAL KNEE Left 10/2009  . REPLACEMENT TOTAL KNEE Left 10/2009  . SHOULDER OPEN ROTATOR CUFF REPAIR  03/01/2012   Procedure: ROTATOR CUFF REPAIR SHOULDER OPEN;  Surgeon: Nita Sells, MD;  Location: [facility];  Service: Orthopedics;  Laterality: Left;  SUBSCAPULARIS REPAIR  . STRESS TEST - ADENOSINE THALLIUM  1997   apical and inferior segments with questionable infarct and ischemia in anteroseptal & inferoseptal segments (Dr. Aileen Fass)  . SVD     x 7  . TOTAL HIP ARTHROPLASTY Right 07/24/2016  . TOTAL HIP ARTHROPLASTY Right 07/24/2016   Procedure: RIGHT TOTAL HIP ARTHROPLASTY ANTERIOR APPROACH;  Surgeon: Dorna Leitz, MD;  Location: [facility];  Service: Orthopedics;  Laterality: Right;  . TOTAL HIP ARTHROPLASTY Left 04/09/2017   Procedure: LEFT TOTAL HIP ARTHROPLASTY ANTERIOR APPROACH;  Surgeon: Dorna Leitz, MD;  Location: [facility];  Service: Orthopedics;  Laterality: Left;  . TOTAL SHOULDER ARTHROPLASTY  01/18/2012   Procedure: TOTAL SHOULDER ARTHROPLASTY;  Surgeon: Nita Sells, MD;  Location: [facility];  Service: Orthopedics;  Laterality: Left;  . TOTAL SHOULDER REPLACEMENT Left 01/19/2012  . TRANSTHORACIC ECHOCARDIOGRAM  06/2011   LV mildly dilated, EF=>55%; mild mitral annular calcif, mild MR; mild TR; AV mildly sclerotic, mild aortic regurg  . TUBAL LIGATION    . WISDOM TOOTH EXTRACTION       Social History   Socioeconomic History  . Marital status: Widowed    Spouse name: Not on file  . Number of children: 7  . Years of education: Not on file  . Highest education level: Not on file  Occupational History  . Occupation: retired    Fish farm manager: RETIRED  Social Needs  . Financial resource strain: Not on file  . Food insecurity:    Worry: Not on file    Inability: Not on file  . Transportation needs:  Medical: Not on file    Non-medical: Not on file  Tobacco Use  . Smoking status: Former Smoker    Packs/day: 1.00    Years: 25.00    Pack years: 25.00    Types: Cigarettes    Last attempt to quit: 03/07/1984    Years since quitting: 33.9  . Smokeless tobacco: Former Network engineer and Sexual Activity  . Alcohol use: No  . Drug use: No  . Sexual activity: Never    Birth control/protection: Surgical  Lifestyle  . Physical activity:    Days per week: Not on file    Minutes per session: Not on file  . Stress: Not on file  Relationships  . Social connections:    Talks on phone: Not on file    Gets together: Not on file    Attends religious service: Not on file    Active member of club or organization: Not on file    Attends meetings of clubs or organizations: Not on file    Relationship status: Not on file  Other Topics Concern  . Not on file  Social History Narrative   Marital status:   She is a widowed since 2009; married 40 years      Children:   mother of 54, grandmother of 87, great-grandmother of 40.      Lives: alone with home; daughter, son lives with patient      Tobacco: quit smoking 34 years ago      Alcohol: none      ADLs:  Walks with cane; drives.     She does not drink alcohol or smoke cigarettes.   She previously tried exercise routinely but is not exercising as much lately. Her excuse is do to knee pain and back pain as well as cold weather.    Family History  Problem Relation Age of Onset  . Heart disease Mother   . Heart disease  Father   . Heart disease Sister   . Hypertension Sister   . Heart disease Sister   . Hypertension Sister   . Heart disease Sister   . Hypertension Sister      ROS Review of Systems See HPI Constitution: No fevers or chills No malaise No diaphoresis Skin: No rash or itching Eyes: no blurry vision, no double vision GU: no dysuria or hematuria Neuro: no dizziness or headaches * all others reviewed and negative   Objective: Vitals:   02/21/18 1208  BP: 137/75  Pulse: (!) 51  Resp: 16  Temp: 98.4 F (36.9 C)  TempSrc: Oral  SpO2: 94%  Weight: 199 lb 6.4 oz (90.4 kg)  Height: 5' (1.524 m)    Physical Exam Physical Exam  Constitutional: She is oriented to person, place, and time. She appears well-developed and well-nourished.  HENT:  Head: Normocephalic and atraumatic.  Eyes: Conjunctivae and EOM are normal.  Cardiovascular: Normal rate, regular rhythm and normal heart sounds.   Pulmonary/Chest: Effort normal and breath sounds normal. No respiratory distress. She has no wheezes.  Neurological: She is alert and oriented to person, place, and time.     Assessment and Plan [name] was seen today for medication f/u.  Diagnoses and all orders for this visit:  Hyperlipidemia with target LDL less than 100- will assess levels Continue crestor -     Lipid panel -     CMP14+EGFR  Encounter for medication monitoring -     Lipid panel -     [id number]  Complicated  grieving- mood improving   Essential hypertension- Patient's blood pressure is at goal of 139/89 or less. Condition is stable. Continue current medications and treatment plan. I recommend that you exercise for 30-45 minutes 5 days a week. I also recommend a balanced diet with fruits and vegetables every day, lean meats, and little fried foods. The DASH diet (you can find this online) is a good example of this.      [place]

## 2018-02-22 LAB — CMP14+EGFR
ALBUMIN: 4.2 g/dL (ref 3.5–4.8)
ALT: 11 IU/L (ref 0–32)
AST: 20 IU/L (ref 0–40)
Albumin/Globulin Ratio: 1.5 (ref 1.2–2.2)
Alkaline Phosphatase: 72 IU/L (ref 39–117)
BILIRUBIN TOTAL: 0.3 mg/dL (ref 0.0–1.2)
BUN / CREAT RATIO: 21 (ref 12–28)
BUN: 15 mg/dL (ref 8–27)
CHLORIDE: 106 mmol/L (ref 96–106)
CO2: 26 mmol/L (ref 20–29)
Calcium: 10 mg/dL (ref 8.7–10.3)
Creatinine, Ser: 0.71 mg/dL (ref 0.57–1.00)
GFR, EST AFRICAN AMERICAN: 96 mL/min/{1.73_m2} (ref >59)
GFR, EST NON AFRICAN AMERICAN: 84 mL/min/{1.73_m2} (ref >59)
GLUCOSE: 78 mg/dL (ref 65–99)
Globulin, Total: 2.8 g/dL (ref 1.5–4.5)
Potassium: 4.3 mmol/L (ref 3.5–5.2)
Sodium: 145 mmol/L — ABNORMAL HIGH (ref 134–144)
TOTAL PROTEIN: 7 g/dL (ref 6.0–8.5)

## 2018-02-22 LAB — LIPID PANEL
Chol/HDL Ratio: 2.1 ratio (ref 0.0–4.4)
Cholesterol, Total: 181 mg/dL (ref 100–199)
HDL: 87 mg/dL (ref >39)
LDL Calculated: 84 mg/dL (ref 0–99)
TRIGLYCERIDES: 49 mg/dL (ref 0–149)
VLDL Cholesterol Cal: 10 mg/dL (ref 5–40)

## 2018-03-09 ENCOUNTER — Ambulatory Visit: Payer: MEDICARE | Admitting: Psychology

## 2018-03-09 DIAGNOSIS — N3946 Mixed incontinence: Secondary | ICD-10-CM | POA: Diagnosis not present

## 2018-03-09 DIAGNOSIS — R3912 Poor urinary stream: Secondary | ICD-10-CM | POA: Diagnosis not present

## 2018-03-09 DIAGNOSIS — R351 Nocturia: Secondary | ICD-10-CM | POA: Diagnosis not present

## 2018-03-09 DIAGNOSIS — N130 Hydronephrosis with ureteropelvic junction obstruction: Secondary | ICD-10-CM | POA: Diagnosis not present

## 2018-03-09 DIAGNOSIS — N8111 Cystocele, midline: Secondary | ICD-10-CM | POA: Diagnosis not present

## 2018-03-12 ENCOUNTER — Ambulatory Visit: Payer: MEDICARE | Admitting: Family Medicine

## 2018-03-15 ENCOUNTER — Ambulatory Visit
Admission: RE | Admit: 2018-03-15 | Discharge: 2018-03-15 | Disposition: A | Discharge status: Home / Self Care | Payer: MEDICARE | Source: Ambulatory Visit | Attending: Family Medicine | Admitting: Family Medicine

## 2018-03-15 DIAGNOSIS — Z1231 Encounter for screening mammogram for malignant neoplasm of breast: Secondary | ICD-10-CM | POA: Diagnosis not present

## 2018-03-16 ENCOUNTER — Ambulatory Visit: Payer: Self-pay

## 2018-03-18 ENCOUNTER — Other Ambulatory Visit: Payer: Self-pay | Admitting: Family Medicine

## 2018-03-18 NOTE — Telephone Encounter (Signed)
Requested medication (s) are due for refill today: yes  Requested medication (s) are on the active medication list: yes  Last refill:  01/08/18  Future visit scheduled: yes with cardiology.    Notes to clinic:      Requested Prescriptions  Pending Prescriptions Disp Refills   rosuvastatin (CRESTOR) 40 MG tablet 90 tablet 1    Sig: Take 1 tablet (40 mg total) by mouth daily.     Cardiovascular:  Antilipid - Statins Passed - 03/18/2018  8:33 AM      Passed - Total Cholesterol in normal range and within 360 days    Cholesterol, Total  Date Value Ref Range Status  02/21/2018 181 100 - 199 mg/dL Final         Passed - LDL in normal range and within 360 days    LDL Calculated  Date Value Ref Range Status  02/21/2018 84 0 - 99 mg/dL Final         Passed - HDL in normal range and within 360 days    HDL  Date Value Ref Range Status  02/21/2018 87 >39 mg/dL Final         Passed - Triglycerides in normal range and within 360 days    Triglycerides  Date Value Ref Range Status  02/21/2018 49 0 - 149 mg/dL Final         Passed - Patient is not pregnant      Passed - Valid encounter within last 12 months    Recent Outpatient Visits          3 weeks ago Hyperlipidemia with target LDL less than 100   [facility], Manus RuddZoe A, MD   2 months ago Complicated grieving   [facility], Manus RuddZoe A, MD   7 months ago Essential hypertension   [facility], Myrle ShengKristi M, MD   1 year ago Essential hypertension   [facility], Myrle ShengKristi M, MD   1 year ago Essential hypertension   [facility], Myrle ShengKristi M, MD      Future Appointments            In 5 months Doristine BosworthStallings, Zoe A, MD [facility]

## 2018-03-18 NOTE — Telephone Encounter (Signed)
Copied from CRM [id number]. Topic: Quick Communication - Rx Refill/Question >> Mar 18, 2018  8:03 AM Jaquita Rectoravis, Karen A wrote: Medication: rosuvastatin (CRESTOR) 40 MG tablet   Per patient she pays cash for Rx [facility]  Has the patient contacted their pharmacy? Yes.   (Agent: If no, request that the patient contact the pharmacy for the refill.) (Agent: If yes, when and what did the pharmacy advise?)  Preferred Pharmacy (with phone number or street name): [facility] [address] - [place], [place] - [id number] [phone] (Phone) [phone] (Fax    Agent: Please be advised that RX refills may take up to 3 business days. We ask that you follow-up with your pharmacy.

## 2018-03-21 DIAGNOSIS — N200 Calculus of kidney: Secondary | ICD-10-CM | POA: Diagnosis not present

## 2018-03-21 DIAGNOSIS — K5730 Diverticulosis of large intestine without perforation or abscess without bleeding: Secondary | ICD-10-CM | POA: Diagnosis not present

## 2018-03-21 DIAGNOSIS — N130 Hydronephrosis with ureteropelvic junction obstruction: Secondary | ICD-10-CM | POA: Diagnosis not present

## 2018-03-21 MED ORDER — ROSUVASTATIN CALCIUM 40 MG PO TABS: 40.0000 mg | ORAL_TABLET | Freq: Every day | ORAL | 1 refills | Status: DC

## 2018-03-22 ENCOUNTER — Ambulatory Visit (INDEPENDENT_AMBULATORY_CARE_PROVIDER_SITE_OTHER): Payer: MEDICARE | Admitting: Psychology

## 2018-03-22 DIAGNOSIS — F4323 Adjustment disorder with mixed anxiety and depressed mood: Secondary | ICD-10-CM

## 2018-03-30 ENCOUNTER — Telehealth: Payer: Self-pay | Admitting: Cardiology

## 2018-03-30 ENCOUNTER — Ambulatory Visit (INDEPENDENT_AMBULATORY_CARE_PROVIDER_SITE_OTHER): Payer: MEDICARE | Admitting: Cardiology

## 2018-03-30 ENCOUNTER — Encounter: Payer: Self-pay | Admitting: Cardiology

## 2018-03-30 VITALS — BP 120/78 | HR 67 | Ht 60.0 in | Wt 199.8 lb

## 2018-03-30 DIAGNOSIS — Z01810 Encounter for preprocedural cardiovascular examination: Secondary | ICD-10-CM

## 2018-03-30 DIAGNOSIS — E785 Hyperlipidemia, unspecified: Secondary | ICD-10-CM

## 2018-03-30 DIAGNOSIS — R600 Localized edema: Secondary | ICD-10-CM | POA: Diagnosis not present

## 2018-03-30 DIAGNOSIS — I10 Essential (primary) hypertension: Secondary | ICD-10-CM | POA: Diagnosis not present

## 2018-03-30 NOTE — Progress Notes (Signed)
PCP: [name], MD Urologist: Dr. Lissa Hoard  Clinic Note: Chief Complaint  Patient presents with  . Follow-up    No active heart symptoms  . Pre-op Exam    Hysterectomy    HPI: [name] is a 77 y.o. female with a PMH below who presents today 6 months follow-up for hypertension hyperlipidemia and for preop evaluation for upcoming hysterectomy.    She is a morbidly obese woman who I have been following for several years for hypertension and hyperlipidemia.    Cardiac evaluation in the past have been negative.  [name] was last seen in June 2019.  She is in great spirits feeling well.  Had lost weight.  Was happy about having both her hip surgery is completed and was feeling a more pain.  But starting to have issues with bladder prolapse and GERD.  Recent Hospitalizations:   ER evaluation 01/01/2018 for what presumably was an anxiety attack.  Studies Personally Reviewed - (if available, images/films reviewed: From Epic Chart or Care Everywhere)  none  Interval History: [name] returns today for long follow-up doing very well clinically from a cardiac standpoint, however she is somewhat upset about the fact that she probably needs to have hysterectomy to help with her bladder prolapse issues.  She has not had an OR date set definitely, but does think that this is not too far off.  Partly because of discomfort with the prolapse issues, she is not as active as she wants to be, but is still working on losing weight and staying active.  She denies any resting exertional chest tightness pressure.  No exertional dyspnea unless she overdoes it.  She still has some mild lower extremity edema but otherwise no heart failure symptoms.  Cardiovascular review of symptoms: no chest pain or dyspnea on exertion positive for - edema negative for - edema, irregular heartbeat, murmur, orthopnea, palpitations, paroxysmal nocturnal dyspnea, rapid heart rate, shortness of breath or  Syncope/near syncope, TIA/amaurosis fugax.  No claudication.  She has had a plateau with weight loss, but has been able to maintain her weights.  ROS: A comprehensive was performed. Review of Systems  Constitutional: Negative for malaise/fatigue and weight loss (Maintaining stable weight).  HENT: Negative for nosebleeds.   Respiratory: Negative for shortness of breath.   Gastrointestinal: Negative for abdominal pain, blood in stool, heartburn and melena.  Genitourinary: Positive for frequency and urgency. Negative for hematuria.       Bladder prolapse issues  Musculoskeletal: Positive for back pain and joint pain.  Neurological: Negative for dizziness and focal weakness.  Endo/Heme/Allergies: Does not bruise/bleed easily.  Psychiatric/Behavioral: Negative.   All other systems reviewed and are negative.  I have reviewed and (if needed) personally updated the patient's problem list, medications, allergies, past medical and surgical history, social and family history.   Past Medical History:  Diagnosis Date  . Anemia    on iron  . Anxiety   . Arthritis    shoulders, knees, hands, hips; status post left knee surgery  . Chronic bronchitis (HCC)    CXR 07/08/16  . Constipation   . Depression   . GERD (gastroesophageal reflux disease)    TAKES TUMS  . H/O: pneumonia 2003  . Heart murmur    ???   YEARS AGO; denies    . Hyperlipidemia    on med  . Hypertension   . Morbid obesity with BMI of 45.0-49.9, adult (HCC)   . Pneumonia  15-13YRS  . Shingles    recent 02/2017  . Thoracic aortic atherosclerosis (HCC)    CXR 07/08/16    Past Surgical History:  Procedure Laterality Date  . CARDIAC CATHETERIZATION  04/20/1996   patent coronaries, EF 74% (Dr. Aram CandelaW. Gamble)  . CARPAL TUNNEL RELEASE Left   . CATARACT EXTRACTION, BILATERAL    . COLONOSCOPY    . KNEE SURGERY Left 11/2001  . REPLACEMENT TOTAL KNEE Left 10/2009  . REPLACEMENT TOTAL KNEE Left 10/2009  . SHOULDER OPEN ROTATOR CUFF  REPAIR  03/01/2012   Procedure: ROTATOR CUFF REPAIR SHOULDER OPEN;  Surgeon: Mable ParisJustin William Chandler, MD;  [facility];  Service: Orthopedics;  Laterality: Left;  SUBSCAPULARIS REPAIR  . STRESS TEST - ADENOSINE THALLIUM  1997   apical and inferior segments with questionable infarct and ischemia in anteroseptal & inferoseptal segments (Dr. Standley Brooking. Shepherd)  . SVD     x 7  . TOTAL HIP ARTHROPLASTY Right 07/24/2016  . TOTAL HIP ARTHROPLASTY Right 07/24/2016   Procedure: RIGHT TOTAL HIP ARTHROPLASTY ANTERIOR APPROACH;  Surgeon: Jodi GeraldsJohn Graves, MD;  Location: [facility] OR;  Service: Orthopedics;  Laterality: Right;  . TOTAL HIP ARTHROPLASTY Left 04/09/2017   Procedure: LEFT TOTAL HIP ARTHROPLASTY ANTERIOR APPROACH;  Surgeon: Jodi GeraldsGraves, John, MD;  Location: [facility] ORS;  Service: Orthopedics;  Laterality: Left;  . TOTAL SHOULDER ARTHROPLASTY  01/18/2012   Procedure: TOTAL SHOULDER ARTHROPLASTY;  Surgeon: Mable ParisJustin William Chandler, MD;  [facility];  Service: Orthopedics;  Laterality: Left;  . TOTAL SHOULDER REPLACEMENT Left 01/19/2012  . TRANSTHORACIC ECHOCARDIOGRAM  06/2011   LV mildly dilated, EF=>55%; mild mitral annular calcif, mild MR; mild TR; AV mildly sclerotic, mild aortic regurg  . TUBAL LIGATION    . WISDOM TOOTH EXTRACTION      Current Meds  Medication Sig  . calcium carbonate (TUMS - DOSED IN MG ELEMENTAL CALCIUM) 500 MG chewable tablet Chew 2-4 tablets by mouth 2 (two) times daily as needed for indigestion or heartburn.  . docusate sodium (COLACE) 100 MG capsule Take 1 capsule (100 mg total) by mouth 2 (two) times daily. (Patient taking differently: Take 100 mg by mouth daily. )  . Ferrous Sulfate (IRON) 325 (65 Fe) MG TABS Take 1 tablet by mouth every morning.  . hydrOXYzine (ATARAX/VISTARIL) 25 MG tablet Take 1 tablet (25 mg total) by mouth 3 (three) times daily as needed.  Marland Kitchen. lisinopril (PRINIVIL,ZESTRIL) 20 MG tablet Take 1 tablet (20 mg total) by mouth daily.  . rosuvastatin (CRESTOR) 40 MG  tablet Take 1 tablet (40 mg total) by mouth daily.   -Is now taking 81 mg aspirin  Allergies  Allergen Reactions  . Effexor [Venlafaxine Hcl] Swelling and Other (See Comments)    MD ADVISES PATIENT TO NOT TAKE IN FUTURE PT STOPPED, MD DC'd MED  . Sulfa Drugs Cross Reactors Itching    Social History   Tobacco Use  . Smoking status: Former Smoker    Packs/day: 1.00    Years: 25.00    Pack years: 25.00    Types: Cigarettes    Last attempt to quit: 03/07/1984    Years since quitting: 34.0  . Smokeless tobacco: Former Engineer, waterUser  Substance Use Topics  . Alcohol use: No  . Drug use: No   Social History   Social History Narrative   Marital status:   She is a widowed since 2009; married 45 years      Children:   mother of 7, grandmother of 5, great-grandmother of 6.  Lives: alone with home; daughter, son lives with patient      Tobacco: quit smoking 34 years ago      Alcohol: none      ADLs:  Walks with cane; drives.     She does not drink alcohol or smoke cigarettes.   She previously tried exercise routinely but is not exercising as much lately. Her excuse is do to knee pain and back pain as well as cold weather.    family history includes Heart disease in her father, mother, sister, sister, and sister; Hypertension in her sister, sister, and sister.  Wt Readings from Last 3 Encounters:  03/30/18 199 lb 12.8 oz (90.6 kg)  02/21/18 199 lb 6.4 oz (90.4 kg)  01/08/18 197 lb (89.4 kg)   2017 - 234 lb  PHYSICAL EXAM BP 120/78   Pulse 67   Ht 5' (1.524 m)   Wt 199 lb 12.8 oz (90.6 kg)   BMI 39.02 kg/m  Physical Exam  Constitutional: She is oriented to person, place, and time. She appears well-developed and well-nourished. No distress.  ~ borderline morbidly obese - but notably lighter than las visit. -- mostly below the waist  HENT:  Head: Normocephalic and atraumatic.  Neck: Normal range of motion. Neck supple. No hepatojugular reflux and no JVD present. Carotid bruit is  not present.  Cardiovascular: Normal rate, regular rhythm, normal heart sounds, intact distal pulses and normal pulses.  No extrasystoles are present. PMI is not displaced. Exam reveals no gallop and no friction rub.  No murmur heard. Pulmonary/Chest: Effort normal and breath sounds normal. No respiratory distress. She has no wheezes. She has no rales.  Abdominal: Soft. Bowel sounds are normal. She exhibits no distension. There is no abdominal tenderness. There is no rebound.  Unable to palpate HSM  Musculoskeletal: Normal range of motion.        General: Edema (Trace edema.  Difficult to tell versus adipose tissue.) present.  Neurological: She is alert and oriented to person, place, and time.  Psychiatric: She has a normal mood and affect. Her behavior is normal. Judgment and thought content normal.  Vitals reviewed.   Adult ECG Report Not checked  Other studies Reviewed: Additional studies/ records that were reviewed today include:  Recent Labs:    Lab Results  Component Value Date   CREATININE 0.71 02/21/2018   BUN 15 02/21/2018   NA 145 (H) 02/21/2018   K 4.3 02/21/2018   CL 106 02/21/2018   CO2 26 02/21/2018   Lab Results  Component Value Date   CHOL 181 02/21/2018   HDL 87 02/21/2018   LDLCALC 84 02/21/2018   TRIG 49 02/21/2018   CHOLHDL 2.1 02/21/2018   Lab Results  Component Value Date   HGBA1C 5.8 05/01/2012    ASSESSMENT / PLAN: Problem List Items Addressed This Visit    Bilateral edema of lower extremity (Chronic)    Stable lower extremity edema.  Is no longer taking any diuretic.  Rarely has an issue now.      Essential hypertension (Chronic)    Well-controlled.  Is on ACE inhibitor.  Stable.  May want to hold ACE inhibitor on day of surgery and restart once stable postop      Hyperlipidemia with target LDL less than 100 (Chronic)    Continue current dose of rosuvastatin.  Lipids look much better compared to last evaluation on pravastatin.      Preop  cardiovascular exam - Primary    She has  done well with bilateral hip surgery.  Still no active cardiac symptoms.  Now that her hip surgery has been done, she is able to walk better and denies any chest pain or pressure. No history of stroke.  Normal renal function with creatinine of 0.71. Vaginal hysterectomy is low risk surgery.  As such, she should be fine with a low risk patient for low risk surgery.  No further cardiac evaluation required. Of note, she is on 325 mg aspirin which can reduce to 81 mg and can be held for surgery.         Current medicines are reviewed at length with the patient today.  (+/- concerns) none The following changes have been made:  none  Patient Instructions  Medication Instructions:  NOT NEEDED If you need a refill on your cardiac medications before your next appointment, please call your pharmacy.   Lab work: NOT NEEDED If you have labs (blood work) drawn today and your tests are completely normal, you will receive your results only by: Marland Kitchen MyChart Message (if you have MyChart) OR . A paper copy in the mail If you have any lab test that is abnormal or we need to change your treatment, we will call you to review the results.  Testing/Procedures: NOT NEEDED  Follow-Up: At [facility], you and your health needs are our priority.  As part of our continuing mission to provide you with exceptional heart care, we have created designated Provider Care Teams.  These Care Teams include your primary Cardiologist (physician) and Advanced Practice Providers (APPs -  Physician Assistants and Nurse Practitioners) who all work together to provide you with the care you need, when you need it. You will need a follow up appointment in 12 months.  Please call our office 2 months in advance to schedule this appointment.  You may see Bryan Lemma, MD or one of the following Advanced Practice Providers on your designated Care Team:   Theodore Demark, PA-C . Joni Reining, DNP, ANP  Any Other Special Instructions Will Be Listed Below (If Applicable).    OKAY TO HAVE UROLOGY SURGERY - LOW RISK FROM CARDIAC STANDPOINT.      Studies Ordered:   No orders of the defined types were placed in this encounter.     Bryan Lemma, M.D., M.S. Interventional Cardiologist   Pager # [phone] Phone # [phone] [address] [place], [place] [address]   Thank you for choosing Heartcare at [facility]!!

## 2018-03-30 NOTE — Telephone Encounter (Signed)
Spoke with pt's daughter who states she would like all Rx to [facility] [place]. Informed daughter, as of now that's we have listed as pt's main pharmacy.

## 2018-03-30 NOTE — Patient Instructions (Signed)
Medication Instructions:  NOT NEEDED If you need a refill on your cardiac medications before your next appointment, please call your pharmacy.   Lab work: NOT NEEDED If you have labs (blood work) drawn today and your tests are completely normal, you will receive your results only by: Marland Kitchen. MyChart Message (if you have MyChart) OR . A paper copy in the mail If you have any lab test that is abnormal or we need to change your treatment, we will call you to review the results.  Testing/Procedures: NOT NEEDED  Follow-Up: At [facility], you and your health needs are our priority.  As part of our continuing mission to provide you with exceptional heart care, we have created designated Provider Care Teams.  These Care Teams include your primary Cardiologist (physician) and Advanced Practice Providers (APPs -  Physician Assistants and Nurse Practitioners) who all work together to provide you with the care you need, when you need it. You will need a follow up appointment in 12 months.  Please call our office 2 months in advance to schedule this appointment.  You may see Bryan Lemmaavid Harding, MD or one of the following Advanced Practice Providers on your designated Care Team:   Theodore DemarkRhonda Barrett, PA-C . Joni ReiningKathryn Lawrence, DNP, ANP  Any Other Special Instructions Will Be Listed Below (If Applicable).    OKAY TO HAVE UROLOGY SURGERY - LOW RISK FROM CARDIAC STANDPOINT.

## 2018-03-30 NOTE — Telephone Encounter (Signed)
  Patient's daughter would like it put in Ms Hedy CamaraWades record that [facility]

## 2018-04-03 ENCOUNTER — Encounter: Payer: Self-pay | Admitting: Cardiology

## 2018-04-03 NOTE — Assessment & Plan Note (Addendum)
Stable lower extremity edema.  Is no longer taking any diuretic.  Rarely has an issue now.

## 2018-04-03 NOTE — Assessment & Plan Note (Signed)
She has done well with bilateral hip surgery.  Still no active cardiac symptoms.  Now that her hip surgery has been done, she is able to walk better and denies any chest pain or pressure. No history of stroke.  Normal renal function with creatinine of 0.71. Vaginal hysterectomy is low risk surgery.  As such, she should be fine with a low risk patient for low risk surgery.  No further cardiac evaluation required. Of note, she is on 325 mg aspirin which can reduce to 81 mg and can be held for surgery.

## 2018-04-03 NOTE — Assessment & Plan Note (Signed)
Continue current dose of rosuvastatin.  Lipids look much better compared to last evaluation on pravastatin.

## 2018-04-03 NOTE — Assessment & Plan Note (Signed)
Well-controlled.  Is on ACE inhibitor.  Stable.  May want to hold ACE inhibitor on day of surgery and restart once stable postop

## 2018-04-04 ENCOUNTER — Ambulatory Visit: Payer: Self-pay

## 2018-04-14 DIAGNOSIS — R3912 Poor urinary stream: Secondary | ICD-10-CM | POA: Diagnosis not present

## 2018-04-14 DIAGNOSIS — R350 Frequency of micturition: Secondary | ICD-10-CM | POA: Diagnosis not present

## 2018-04-14 DIAGNOSIS — R3915 Urgency of urination: Secondary | ICD-10-CM | POA: Diagnosis not present

## 2018-04-18 ENCOUNTER — Other Ambulatory Visit: Payer: Self-pay | Admitting: Family Medicine

## 2018-04-21 NOTE — Progress Notes (Signed)
Letter mailed out.

## 2018-05-17 ENCOUNTER — Ambulatory Visit: Payer: MEDICARE | Admitting: Psychology

## 2018-05-23 ENCOUNTER — Telehealth: Payer: Self-pay | Admitting: Family Medicine

## 2018-05-23 NOTE — Telephone Encounter (Signed)
Patient needs to be called to reschedule her AWV

## 2018-05-31 ENCOUNTER — Telehealth: Payer: Self-pay | Admitting: *Deleted

## 2018-05-31 NOTE — Telephone Encounter (Signed)
Left detailed message to give [place] a day and time Monday thru Thrusday 8 am to 2- pm that is good for her to book appointment for AWV I will call her back to let her know if it is available.

## 2018-06-01 ENCOUNTER — Ambulatory Visit (INDEPENDENT_AMBULATORY_CARE_PROVIDER_SITE_OTHER): Payer: MEDICARE | Admitting: Psychology

## 2018-06-01 DIAGNOSIS — F4322 Adjustment disorder with anxiety: Secondary | ICD-10-CM

## 2018-06-16 ENCOUNTER — Observation Stay (HOSPITAL_BASED_OUTPATIENT_CLINIC_OR_DEPARTMENT_OTHER): Payer: MEDICARE

## 2018-06-16 ENCOUNTER — Other Ambulatory Visit: Payer: Self-pay

## 2018-06-16 ENCOUNTER — Observation Stay (HOSPITAL_COMMUNITY)
Admission: EM | Admit: 2018-06-16 | Discharge: 2018-06-17 | Disposition: A | Discharge status: Home / Self Care | Payer: MEDICARE | Attending: Cardiology | Admitting: Cardiology

## 2018-06-16 ENCOUNTER — Emergency Department (HOSPITAL_COMMUNITY): Payer: MEDICARE

## 2018-06-16 ENCOUNTER — Encounter (HOSPITAL_COMMUNITY): Payer: Self-pay | Admitting: Cardiology

## 2018-06-16 DIAGNOSIS — E785 Hyperlipidemia, unspecified: Secondary | ICD-10-CM | POA: Insufficient documentation

## 2018-06-16 DIAGNOSIS — Z7982 Long term (current) use of aspirin: Secondary | ICD-10-CM | POA: Diagnosis not present

## 2018-06-16 DIAGNOSIS — R079 Chest pain, unspecified: Principal | ICD-10-CM | POA: Insufficient documentation

## 2018-06-16 DIAGNOSIS — Z87891 Personal history of nicotine dependence: Secondary | ICD-10-CM | POA: Diagnosis not present

## 2018-06-16 DIAGNOSIS — I361 Nonrheumatic tricuspid (valve) insufficiency: Secondary | ICD-10-CM | POA: Diagnosis not present

## 2018-06-16 DIAGNOSIS — Z96643 Presence of artificial hip joint, bilateral: Secondary | ICD-10-CM | POA: Insufficient documentation

## 2018-06-16 DIAGNOSIS — Z7722 Contact with and (suspected) exposure to environmental tobacco smoke (acute) (chronic): Secondary | ICD-10-CM | POA: Insufficient documentation

## 2018-06-16 DIAGNOSIS — R0789 Other chest pain: Secondary | ICD-10-CM

## 2018-06-16 DIAGNOSIS — I10 Essential (primary) hypertension: Secondary | ICD-10-CM | POA: Diagnosis not present

## 2018-06-16 DIAGNOSIS — R072 Precordial pain: Secondary | ICD-10-CM

## 2018-06-16 HISTORY — DX: Chest pain, unspecified: R07.9

## 2018-06-16 HISTORY — PX: TRANSTHORACIC ECHOCARDIOGRAM: SHX275

## 2018-06-16 LAB — BASIC METABOLIC PANEL
Anion gap: 9 (ref 5–15)
BUN: 18 mg/dL (ref 8–23)
CO2: 24 mmol/L (ref 22–32)
Calcium: 9.4 mg/dL (ref 8.9–10.3)
Chloride: 102 mmol/L (ref 98–111)
Creatinine, Ser: 0.95 mg/dL (ref 0.44–1.00)
GFR calc Af Amer: >60 mL/min (ref >60)
GFR calc non Af Amer: 59 mL/min — ABNORMAL LOW (ref >60)
Glucose, Bld: 96 mg/dL (ref 70–99)
POTASSIUM: 4.5 mmol/L (ref 3.5–5.1)
Sodium: 135 mmol/L (ref 135–145)

## 2018-06-16 LAB — I-STAT TROPONIN, ED
Troponin i, poc: 0.01 ng/mL (ref 0.00–0.08)
Troponin i, poc: 0.01 ng/mL (ref 0.00–0.08)

## 2018-06-16 LAB — CBC
HCT: 38 % (ref 36.0–46.0)
Hemoglobin: 11.7 g/dL — ABNORMAL LOW (ref 12.0–15.0)
MCH: 29.8 pg (ref 26.0–34.0)
MCHC: 30.8 g/dL (ref 30.0–36.0)
MCV: 96.7 fL (ref 80.0–100.0)
Platelets: 226 10*3/uL (ref 150–400)
RBC: 3.93 MIL/uL (ref 3.87–5.11)
RDW: 14.4 % (ref 11.5–15.5)
WBC: 5 10*3/uL (ref 4.0–10.5)
nRBC: 0 % (ref 0.0–0.2)

## 2018-06-16 LAB — BRAIN NATRIURETIC PEPTIDE: B Natriuretic Peptide: 25.9 pg/mL (ref 0.0–100.0)

## 2018-06-16 LAB — ECHOCARDIOGRAM COMPLETE
Height: 60 in
Weight: 3088 oz

## 2018-06-16 LAB — HEPATIC FUNCTION PANEL
ALT: 15 U/L (ref 0–44)
AST: 27 U/L (ref 15–41)
Albumin: 3.5 g/dL (ref 3.5–5.0)
Alkaline Phosphatase: 60 U/L (ref 38–126)
BILIRUBIN DIRECT: 0.2 mg/dL (ref 0.0–0.2)
Indirect Bilirubin: 0.3 mg/dL (ref 0.3–0.9)
Total Bilirubin: 0.5 mg/dL (ref 0.3–1.2)
Total Protein: 6.9 g/dL (ref 6.5–8.1)

## 2018-06-16 LAB — LIPASE, BLOOD: Lipase: 26 U/L (ref 11–51)

## 2018-06-16 MED ORDER — HEPARIN SODIUM (PORCINE) 5000 UNIT/ML IJ SOLN
5000.0000 [IU] | Freq: Three times a day (TID) | INTRAMUSCULAR | Status: DC
  Administered 2018-06-16 – 2018-06-17 (×4): 5000 [IU] via SUBCUTANEOUS
  Filled 2018-06-16 (×4): qty 1

## 2018-06-16 MED ORDER — LISINOPRIL 20 MG PO TABS
20.0000 mg | ORAL_TABLET | Freq: Every day | ORAL | Status: DC
  Administered 2018-06-16 – 2018-06-17 (×2): 20 mg via ORAL
  Filled 2018-06-16 (×2): qty 1

## 2018-06-16 MED ORDER — ROSUVASTATIN CALCIUM 20 MG PO TABS
40.0000 mg | ORAL_TABLET | Freq: Every day | ORAL | Status: DC
  Administered 2018-06-16 – 2018-06-17 (×2): 40 mg via ORAL
  Filled 2018-06-16 (×2): qty 2

## 2018-06-16 MED ORDER — HYDROXYZINE HCL 25 MG PO TABS
25.0000 mg | ORAL_TABLET | Freq: Three times a day (TID) | ORAL | Status: DC | PRN
  Administered 2018-06-17: 25 mg via ORAL
  Filled 2018-06-16: qty 1

## 2018-06-16 MED ORDER — ASPIRIN EC 81 MG PO TBEC
81.0000 mg | DELAYED_RELEASE_TABLET | Freq: Every day | ORAL | Status: DC
  Administered 2018-06-17: 81 mg via ORAL
  Filled 2018-06-16: qty 1

## 2018-06-16 MED ORDER — TRIMETHOPRIM 100 MG PO TABS
100.0000 mg | ORAL_TABLET | Freq: Every day | ORAL | Status: DC
  Administered 2018-06-16 – 2018-06-17 (×2): 100 mg via ORAL
  Filled 2018-06-16 (×3): qty 1

## 2018-06-16 MED ORDER — FERROUS SULFATE 325 (65 FE) MG PO TABS: 325.0000 mg | ORAL_TABLET | Freq: Every day | ORAL | Status: DC

## 2018-06-16 MED ORDER — ONDANSETRON HCL 4 MG/2ML IJ SOLN
4.0000 mg | Freq: Four times a day (QID) | INTRAMUSCULAR | Status: DC | PRN
  Administered 2018-06-16: 4 mg via INTRAVENOUS
  Filled 2018-06-16: qty 2

## 2018-06-16 MED ORDER — IRON 325 (65 FE) MG PO TABS: 1.0000 | ORAL_TABLET | Freq: Every day | ORAL | Status: DC

## 2018-06-16 MED ORDER — DOCUSATE SODIUM 100 MG PO CAPS
100.0000 mg | ORAL_CAPSULE | Freq: Two times a day (BID) | ORAL | Status: DC
  Administered 2018-06-16 – 2018-06-17 (×3): 100 mg via ORAL
  Filled 2018-06-16 (×3): qty 1

## 2018-06-16 MED ORDER — ACETAMINOPHEN 325 MG PO TABS: 650.0000 mg | ORAL_TABLET | ORAL | Status: DC | PRN

## 2018-06-16 MED ORDER — NITROGLYCERIN 0.4 MG SL SUBL: 0.4000 mg | SUBLINGUAL_TABLET | SUBLINGUAL | Status: DC | PRN

## 2018-06-16 NOTE — ED Notes (Signed)
Pt was incontinent of urine. Pt cleaned adult brief placed on pt.

## 2018-06-16 NOTE — ED Notes (Signed)
Pt incontinent of urine. Pt changed [place] applied to pt.

## 2018-06-16 NOTE — Progress Notes (Signed)
  Echocardiogram 2D Echocardiogram has been performed.  [name] 06/16/2018, 5:05 PM

## 2018-06-16 NOTE — H&P (Addendum)
Cardiology History and Physical:   Patient ID: [name] MRN: [id number]; DOB: 12-Dec-1940  Admit date: 06/16/2018 Date of Consult: 06/16/2018  Primary Care Provider: [name], MD Primary Cardiologist: Bryan Lemma, MD  Primary Electrophysiologist:  None    Patient Profile:   [name] is a 78 y.o. female with a hx of HTN, HLD, morbid obesity, GERD, and anxiety, with recent bilateral hip surgery who is being seen today for the evaluation of chest pain at the request of the EDP Fayrene Helper PAC.  History of Present Illness:   [name] was recently seen by Dr. Herbie Baltimore in clinic for a preoperative evaluation. She was doing well at that time and cleared for a low risk surgery (vaginal hysterectomy) without further cardiovascular testing.  He also notes in his last progress note that cardiac evaluation in the past has been negative.  Since that time, she has had increasing anxiety about upcoming medical procedures and doctor's bills. She presented to [facility] with 3 night history of chest pain.  She reports that starting Monday night Tuesday morning she wakes up every morning around 3 AM to go to the bathroom and has experienced chest pain.  The chest pain is described as a pressure in her left chest that radiates down her left arm.  She reports shortness of breath, nausea, diaphoresis, but no vomiting with the chest pain.  The chest pain lasts approximately 1 hour and is rated as a 3 out of 10.  The chest pain does not recur until the following night.  Of note, yesterday she went to the laundromat and did not have a way to drag her laundry basket into the building.  She carried the laundry basket which was noted to be too heavy for her.  Since then, the chest pain has worsened and is now worse with deep inspiration.  She is also tender to palpation on my exam in her left chest.  She also states that she has been coughing over the past 3 days which is new for her.  The chest pain happens  after she has a coughing fit.  Her chest pain is relieved with aspirin and prayer.  She is very anxious and tearful about her upcoming tests and procedures.  She has trouble urinating because of what sounds like bladder prolapse.  Past Medical History:  Diagnosis Date  . Anemia    on iron  . Anxiety   . Arthritis    shoulders, knees, hands, hips; status post left knee surgery  . Chronic bronchitis (HCC)    CXR 07/08/16  . Constipation   . Depression   . GERD (gastroesophageal reflux disease)    TAKES TUMS  . H/O: pneumonia 2003  . Heart murmur    ???   YEARS AGO; denies    . Hyperlipidemia    on med  . Hypertension   . Morbid obesity with BMI of 45.0-49.9, adult (HCC)   . Pneumonia    15-94YRS  . Shingles    recent 02/2017  . Thoracic aortic atherosclerosis (HCC)    CXR 07/08/16    Past Surgical History:  Procedure Laterality Date  . CARDIAC CATHETERIZATION  04/20/1996   patent coronaries, EF 74% (Dr. Aram Candela)  . CARPAL TUNNEL RELEASE Left   . CATARACT EXTRACTION, BILATERAL    . COLONOSCOPY    . KNEE SURGERY Left 11/2001  . REPLACEMENT TOTAL KNEE Left 10/2009  . REPLACEMENT TOTAL KNEE Left 10/2009  . SHOULDER OPEN  ROTATOR CUFF REPAIR  03/01/2012   Procedure: ROTATOR CUFF REPAIR SHOULDER OPEN;  Surgeon: Mable ParisJustin William Chandler, MD;  [facility];  Service: Orthopedics;  Laterality: Left;  SUBSCAPULARIS REPAIR  . STRESS TEST - ADENOSINE THALLIUM  1997   apical and inferior segments with questionable infarct and ischemia in anteroseptal & inferoseptal segments (Dr. Standley Brooking. Shepherd)  . SVD     x 7  . TOTAL HIP ARTHROPLASTY Right 07/24/2016  . TOTAL HIP ARTHROPLASTY Right 07/24/2016   Procedure: RIGHT TOTAL HIP ARTHROPLASTY ANTERIOR APPROACH;  Surgeon: Jodi GeraldsJohn Graves, MD;  Location: [facility] OR;  Service: Orthopedics;  Laterality: Right;  . TOTAL HIP ARTHROPLASTY Left 04/09/2017   Procedure: LEFT TOTAL HIP ARTHROPLASTY ANTERIOR APPROACH;  Surgeon: Jodi GeraldsGraves, John, MD;  Location: [facility] ORS;   Service: Orthopedics;  Laterality: Left;  . TOTAL SHOULDER ARTHROPLASTY  01/18/2012   Procedure: TOTAL SHOULDER ARTHROPLASTY;  Surgeon: Mable ParisJustin William Chandler, MD;  [facility];  Service: Orthopedics;  Laterality: Left;  . TOTAL SHOULDER REPLACEMENT Left 01/19/2012  . TRANSTHORACIC ECHOCARDIOGRAM  06/2011   LV mildly dilated, EF=>55%; mild mitral annular calcif, mild MR; mild TR; AV mildly sclerotic, mild aortic regurg  . TUBAL LIGATION    . WISDOM TOOTH EXTRACTION       Home Medications:  Prior to Admission medications   Medication Sig Start Date End Date Taking? Authorizing Provider  aspirin EC 325 MG EC tablet Take 1 tablet (325 mg total) by mouth 2 (two) times daily after a meal. Tate 1 month postop to decrease risk of blood clots. Patient taking differently: Take 325 mg by mouth daily.  04/12/17  Yes Marshia LyBethune, Teneil Shiller, PA-C  docusate sodium (COLACE) 100 MG capsule Take 1 capsule (100 mg total) by mouth 2 (two) times daily. Patient taking differently: Take 100 mg by mouth daily.  07/24/16  Yes Marshia LyBethune, Jorie Zee, PA-C  Ferrous Sulfate (IRON) 325 (65 Fe) MG TABS Take 1 tablet by mouth daily.    Yes [provider]  hydrOXYzine (ATARAX/VISTARIL) 25 MG tablet TAKE 1 TABLET BY MOUTH THREE TIMES A DAY AS NEEDED Patient taking differently: Take 25 mg by mouth every 8 (eight) hours as needed for anxiety.  04/18/18  Yes Stallings, Zoe A, MD  lisinopril (PRINIVIL,ZESTRIL) 20 MG tablet Take 1 tablet (20 mg total) by mouth daily. 01/08/18  Yes Stallings, Zoe A, MD  rosuvastatin (CRESTOR) 40 MG tablet Take 1 tablet (40 mg total) by mouth daily. 03/21/18 06/19/18 Yes Stallings, Zoe A, MD  trimethoprim (TRIMPEX) 100 MG tablet Take 100 mg by mouth daily.   Yes [provider]    Inpatient Medications: Scheduled Meds:  Continuous Infusions:  PRN Meds:   Allergies:    Allergies  Allergen Reactions  . Effexor [Venlafaxine Hcl] Swelling and Other (See Comments)    MD ADVISES PATIENT TO  NOT TAKE IN FUTURE PT STOPPED, MD DC'd MED  . Sulfa Drugs Cross Reactors Itching    Social History:   Social History   Socioeconomic History  . Marital status: Widowed    Spouse name: Not on file  . Number of children: 7  . Years of education: Not on file  . Highest education level: Not on file  Occupational History  . Occupation: retired    Associate Professormployer: RETIRED  Social Needs  . Financial resource strain: Not on file  . Food insecurity:    Worry: Not on file    Inability: Not on file  . Transportation needs:    Medical: Not on  file    Non-medical: Not on file  Tobacco Use  . Smoking status: Former Smoker    Packs/day: 1.00    Years: 25.00    Pack years: 25.00    Types: Cigarettes    Last attempt to quit: 03/07/1984    Years since quitting: 34.2  . Smokeless tobacco: Former Engineer, water and Sexual Activity  . Alcohol use: No  . Drug use: No  . Sexual activity: Never    Birth control/protection: Surgical  Lifestyle  . Physical activity:    Days per week: Not on file    Minutes per session: Not on file  . Stress: Not on file  Relationships  . Social connections:    Talks on phone: Not on file    Gets together: Not on file    Attends religious service: Not on file    Active member of club or organization: Not on file    Attends meetings of clubs or organizations: Not on file    Relationship status: Not on file  . Intimate partner violence:    Fear of current or ex partner: Not on file    Emotionally abused: Not on file    Physically abused: Not on file    Forced sexual activity: Not on file  Other Topics Concern  . Not on file  Social History Narrative   Marital status:   She is a widowed since 2009; married 45 years      Children:   mother of 10, grandmother of 5, great-grandmother of 6.      Lives: alone with home; daughter, son lives with patient      Tobacco: quit smoking 34 years ago      Alcohol: none      ADLs:  Walks with cane; drives.     She does  not drink alcohol or smoke cigarettes.   She previously tried exercise routinely but is not exercising as much lately. Her excuse is do to knee pain and back pain as well as cold weather.    Family History:    Family History  Problem Relation Age of Onset  . Heart disease Mother   . Heart disease Father   . Heart disease Sister   . Hypertension Sister   . Heart disease Sister   . Hypertension Sister   . Heart disease Sister   . Hypertension Sister     Mother apparently died in her 14s of heart disease.  Details are vague.   ROS:  Please see the history of present illness.   All other ROS reviewed and negative.     Physical Exam/Data:   Vitals:   06/16/18 0715 06/16/18 0744 06/16/18 0830 06/16/18 1015  BP: 107/61 132/72 95/67   Pulse: (!) 54 (!) 58 (!) 45 (!) 57  Resp: 14 20 19  (!) 21  Temp:      TempSrc:      SpO2: 99%  99% 100%  Weight:      Height:       No intake or output data in the 24 hours ending 06/16/18 1146 Last 3 Weights 06/16/2018 03/30/2018 02/21/2018  Weight (lbs) 219 lb 199 lb 12.8 oz 199 lb 6.4 oz  Weight (kg) 99.338 kg 90.629 kg 90.447 kg     Body mass index is 42.77 kg/m.  General:  Elderly obese female, tearful and anxious HEENT: normal Neck: no JVD Vascular: No carotid bruits; Cardiac:  normal S1, S2; RRR; no murmur, tender to palpation  in her left chest Lungs:  clear to auscultation bilaterally, no wheezing, rhonchi or rales  Abd: soft, nontender, no hepatomegaly  Ext: no edema Musculoskeletal:  No deformities, BUE and BLE strength normal and equal Skin: warm and dry  Neuro:  CNs 2-12 intact, no focal abnormalities noted Psych:  Normal affect   EKG:  The EKG was personally reviewed and demonstrates:  sinus Telemetry:  Telemetry was personally reviewed and demonstrates:  sinus  Relevant CV Studies:  none  Laboratory Data:  Chemistry Recent Labs  Lab 06/16/18 0616  NA 135  K 4.5  CL 102  CO2 24  GLUCOSE 96  BUN 18  CREATININE  0.95  CALCIUM 9.4  GFRNONAA 59*  GFRAA >60  ANIONGAP 9    Recent Labs  Lab 06/16/18 0616  PROT 6.9  ALBUMIN 3.5  AST 27  ALT 15  ALKPHOS 60  BILITOT 0.5   Hematology Recent Labs  Lab 06/16/18 0616  WBC 5.0  RBC 3.93  HGB 11.7*  HCT 38.0  MCV 96.7  MCH 29.8  MCHC 30.8  RDW 14.4  PLT 226   Cardiac EnzymesNo results for input(s): TROPONINI in the last 168 hours.  Recent Labs  Lab 06/16/18 0633 06/16/18 1022  TROPIPOC 0.01 0.01    BNPNo results for input(s): BNP, PROBNP in the last 168 hours.  DDimer No results for input(s): DDIMER in the last 168 hours.  Radiology/Studies:  Dg Chest 2 View  Result Date: 06/16/2018 CLINICAL DATA:  Chest pain EXAM: CHEST - 2 VIEW COMPARISON:  04/07/2017 FINDINGS: Chronic cardiomegaly and mild aortic tortuosity. There is no edema, consolidation, effusion, or pneumothorax. Glenohumeral arthroplasty on the left and severe osteoarthritis on the right. IMPRESSION: No acute finding. Cardiomegaly. Electronically Signed   By: Marnee Spring M.D.   On: 06/16/2018 06:50    Assessment and Plan:   1. Chest pain - troponin x 2 negative - EKG negative for acute ischemia - pt describes features of typical and atypical chest pain -She first describes a tightness in her left chest that radiates down her left arm and is associated with shortness of breath, nausea, and diaphoresis.  She also describes chest pain that is worse with deep inspiration, chest pain that occurs after a coughing fit, and is tender to palpation in her left chest that is the same chest pain she is experienced for the past 3 days. Chest pain is also relieved with ASA and prayer -I have a low suspicion that this pain is cardiac in nature; however, she has risk factors including hypertension, hyperlipidemia, obesity, past tobacco use, and family history -Recommend observation overnight - will admit to cardiology - continue to trend troponins - we will obtain an Myoview Lexiscan  tomorrow morning -1 day scan - will obtain echo - switch 325 mg ASA to 81 mg ASA   2.  Hypertension -Home regimen includes 20 mg lisinopril - continue   3.  Hyperlipidemia -On Crestor 40 mg - continue   4.  Anxiety -She is tearful and very anxious about her upcoming procedures   5. Chronic UTI - continue ABX   For questions or updates, please contact [facility] Please consult [email or web address] for contact info under     Signed, Marcelino Duster, PA  06/16/2018 11:46 AM  History and all data above reviewed.  Patient examined.  I agree with the findings as above.  The patient has chest pain.  This happened after picking up some wet laundry.  She says  this is mid chest and heavy and non radiating.  No acute associated symptoms other than vomiting this AM.  She had not had this type of pain before.   She said that it started on Monday and then went away after ASA but came back today.  No objective evidence of ischemia but she does have major cardiovascular risk factors.   The patient exam reveals COR:RRR  ,  Lungs: Clear  ,  Abd: Positive bowel sounds, no rebound no guarding, Ext No edema  .  All available labs, radiology testing, previous records reviewed. Agree with documented assessment and plan. Chest pain:  Atypical greater than typical features:  She does have significant risk factors.  Plan to observe and rule out MI.  If no enzyme elevation then she will need Lexiscan Myoview.   Fayrene Fearing Fong Mccarry  1:56 PM  06/16/2018

## 2018-06-16 NOTE — ED Triage Notes (Signed)
Pt via EMS c/o CP x 2-3 days that feels like a pressure in the middle of her chest that radiates to her left arm. Pt states that she is dealing with a lot of domestic stress and has has a bladder infection x 1 month and is currently taking abx. Pt denies N/V, SOB, or dizziness. Pt took 324 of ASA prior to EMS arrival to her home and was given 1 nitro with relief prior to arrival to ED. EMS vitals HR 65, SpO2 100% RA, BP 118/77, 20 RR. Has significant non pitting LE edema.

## 2018-06-16 NOTE — ED Notes (Signed)
Purewick applied for comfort due to patient incontinence multiple times.

## 2018-06-16 NOTE — ED Notes (Signed)
ED TO INPATIENT HANDOFF REPORT  ED Nurse Name and Phone #: Shanda Bumps [phone]  S Name/Age/Gender [name] 78 y.o. female Room/Bed: 016C/016C  Code Status   Code Status: Prior  Home/SNF/Other Home Patient oriented to: self, place, time and situation Is this baseline? Yes   Triage Complete: Triage complete  Chief Complaint chest pain   Triage Note Pt via EMS c/o CP x 2-3 days that feels like a pressure in the middle of her chest that radiates to her left arm. Pt states that she is dealing with a lot of domestic stress and has has a bladder infection x 1 month and is currently taking abx. Pt denies N/V, SOB, or dizziness. Pt took 324 of ASA prior to EMS arrival to her home and was given 1 nitro with relief prior to arrival to ED. EMS vitals HR 65, SpO2 100% RA, BP 118/77, 20 RR. Has significant non pitting LE edema.   Allergies Allergies  Allergen Reactions  . Effexor [Venlafaxine Hcl] Swelling and Other (See Comments)    MD ADVISES PATIENT TO NOT TAKE IN FUTURE PT STOPPED, MD DC'd MED  . Sulfa Drugs Cross Reactors Itching    Level of Care/Admitting Diagnosis ED Disposition    ED Disposition Condition Comment   Admit  Hospital Area: [facility] [[id number]]  Level of Care: Cardiac Telemetry [103]  Diagnosis: Chest pain [532023]  Admitting Physician: Rollene Rotunda [[id number]]  Attending Physician: Rollene Rotunda [phone]  Bed request comments: 6e  PT Class (Do Not Modify): Observation [104]  PT Acc Code (Do Not Modify): Observation [10022]       B Medical/Surgery History Past Medical History:  Diagnosis Date  . Anemia    on iron  . Anxiety   . Arthritis    shoulders, knees, hands, hips; status post left knee surgery  . Chronic bronchitis (HCC)    CXR 07/08/16  . Depression   . GERD (gastroesophageal reflux disease)    TAKES TUMS  . H/O: pneumonia 2003  . Hyperlipidemia    on med  . Hypertension   . Morbid obesity with BMI of 45.0-49.9, adult  (HCC)   . Shingles    recent 02/2017  . Thoracic aortic atherosclerosis (HCC)    CXR 07/08/16   Past Surgical History:  Procedure Laterality Date  . CARDIAC CATHETERIZATION  04/20/1996   patent coronaries, EF 74% (Dr. Aram Candela)  . CARPAL TUNNEL RELEASE Left   . CATARACT EXTRACTION, BILATERAL    . COLONOSCOPY    . KNEE SURGERY Left 11/2001  . REPLACEMENT TOTAL KNEE Left 10/2009  . REPLACEMENT TOTAL KNEE Left 10/2009  . SHOULDER OPEN ROTATOR CUFF REPAIR  03/01/2012   Procedure: ROTATOR CUFF REPAIR SHOULDER OPEN;  Surgeon: Mable Paris, MD;  Location: [facility] OR;  Service: Orthopedics;  Laterality: Left;  SUBSCAPULARIS REPAIR  . STRESS TEST - ADENOSINE THALLIUM  1997   apical and inferior segments with questionable infarct and ischemia in anteroseptal & inferoseptal segments (Dr. Standley Brooking)  . SVD     x 7  . TOTAL HIP ARTHROPLASTY Right 07/24/2016  . TOTAL HIP ARTHROPLASTY Right 07/24/2016   Procedure: RIGHT TOTAL HIP ARTHROPLASTY ANTERIOR APPROACH;  Surgeon: [name], MD;  Location: [facility] OR;  Service: Orthopedics;  Laterality: Right;  . TOTAL HIP ARTHROPLASTY Left 04/09/2017   Procedure: LEFT TOTAL HIP ARTHROPLASTY ANTERIOR APPROACH;  Surgeon: [name], MD;  Location: [facility] ORS;  Service: Orthopedics;  Laterality: Left;  . TOTAL SHOULDER ARTHROPLASTY  01/18/2012   Procedure: TOTAL SHOULDER ARTHROPLASTY;  Surgeon: Mable Paris, MD;  Location: [facility] OR;  Service: Orthopedics;  Laterality: Left;  . TOTAL SHOULDER REPLACEMENT Left 01/19/2012  . TRANSTHORACIC ECHOCARDIOGRAM  06/2011   LV mildly dilated, EF=>55%; mild mitral annular calcif, mild MR; mild TR; AV mildly sclerotic, mild aortic regurg  . TUBAL LIGATION    . WISDOM TOOTH EXTRACTION       A IV Location/Drains/Wounds Patient Lines/Drains/Airways Status   Active Line/Drains/Airways    Name:   Placement date:   Placement time:   Site:   Days:              Peripheral IV 06/16/18 Right Antecubital   06/16/18    0614     Antecubital   less than 1                                Intake/Output Last 24 hours No intake or output data in the 24 hours ending 06/16/18 1316  Labs/Imaging Results for orders placed or performed during the hospital encounter of 06/16/18 (from the past 48 hour(s))  Basic metabolic panel     Status: Abnormal   Collection Time: 06/16/18  6:16 AM  Result Value Ref Range   Sodium 135 135 - 145 mmol/L   Potassium 4.5 3.5 - 5.1 mmol/L   Chloride 102 98 - 111 mmol/L   CO2 24 22 - 32 mmol/L   Glucose, Bld 96 70 - 99 mg/dL   BUN 18 8 - 23 mg/dL   Creatinine, Ser 1.61 0.44 - 1.00 mg/dL   Calcium 9.4 8.9 - 09.6 mg/dL   GFR calc non Af Amer 59 (L) >60 mL/min   GFR calc Af Amer >60 >60 mL/min   Anion gap 9 5 - 15    Comment: Performed at [facility], [address] [place], [place] [address]  CBC     Status: Abnormal   Collection Time: 06/16/18  6:16 AM  Result Value Ref Range   WBC 5.0 4.0 - 10.5 K/uL   RBC 3.93 3.87 - 5.11 MIL/uL   Hemoglobin 11.7 (L) 12.0 - 15.0 g/dL   HCT 98.1 19.1 - 47.8 %   MCV 96.7 80.0 - 100.0 fL   MCH 29.8 26.0 - 34.0 pg   MCHC 30.8 30.0 - 36.0 g/dL   RDW 29.5 62.1 - 30.8 %   Platelets 226 150 - 400 K/uL   nRBC 0.0 0.0 - 0.2 %    Comment: Performed at [facility], [address], [place], [place] [address]  Hepatic function panel     Status: None   Collection Time: 06/16/18  6:16 AM  Result Value Ref Range   Total Protein 6.9 6.5 - 8.1 g/dL   Albumin 3.5 3.5 - 5.0 g/dL   AST 27 15 - 41 U/L   ALT 15 0 - 44 U/L   Alkaline Phosphatase 60 38 - 126 U/L   Total Bilirubin 0.5 0.3 - 1.2 mg/dL   Bilirubin, Direct 0.2 0.0 - 0.2 mg/dL   Indirect Bilirubin 0.3 0.3 - 0.9 mg/dL    Comment: Performed at [facility], [address], [place], [place] [address]  Lipase, blood     Status: None   Collection Time: 06/16/18  6:16 AM  Result Value Ref Range   Lipase 26 11 - 51 U/L    Comment: Performed at  [facility], [address] [place] [place], [place] [address]  I-stat troponin, ED     Status: None   Collection Time: 06/16/18  6:33 AM  Result Value Ref Range   Troponin i, poc 0.01 0.00 - 0.08 ng/mL   Comment 3            Comment: Due to the release kinetics of cTnI, a negative result within the first hours of the onset of symptoms does not rule out myocardial infarction with certainty. If myocardial infarction is still suspected, repeat the test at appropriate intervals.   I-stat troponin, ED     Status: None   Collection Time: 06/16/18 10:22 AM  Result Value Ref Range   Troponin i, poc 0.01 0.00 - 0.08 ng/mL   Comment 3            Comment: Due to the release kinetics of cTnI, a negative result within the first hours of the onset of symptoms does not rule out myocardial infarction with certainty. If myocardial infarction is still suspected, repeat the test at appropriate intervals.    Dg Chest 2 View  Result Date: 06/16/2018 CLINICAL DATA:  Chest pain EXAM: CHEST - 2 VIEW COMPARISON:  04/07/2017 FINDINGS: Chronic cardiomegaly and mild aortic tortuosity. There is no edema, consolidation, effusion, or pneumothorax. Glenohumeral arthroplasty on the left and severe osteoarthritis on the right. IMPRESSION: No acute finding. Cardiomegaly. Electronically Signed   By: Marnee Spring M.D.   On: 06/16/2018 06:50    Pending Labs [employer] (From admission, onward)    Start     Ordered   Signed and Held  CBC  (heparin)  Once,   R    Comments:  Baseline for heparin therapy IF NOT ALREADY DRAWN.  Notify MD if PLT < 100 K.    Signed and Held   Signed and Held  Creatinine, serum  (heparin)  Once,   R    Comments:  Baseline for heparin therapy IF NOT ALREADY DRAWN.    Signed and Held   Signed and Held  Brain natriuretic peptide  Once,   R     Signed and Held   Signed and Armed forces training and education officer morning,   R     Signed and Held   Signed and Held  CBC  Tomorrow morning,   R     Signed and Held           Vitals/Pain Today's Vitals   06/16/18 0744 06/16/18 0830 06/16/18 1015 06/16/18 1145  BP: 132/72 95/67    Pulse: (!) 58 (!) 45 (!) 57 (!) 54  Resp: 20 19 (!) 21 18  Temp:      TempSrc:      SpO2:  99% 100% 99%  Weight:      Height:      PainSc:        Isolation Precautions No active isolations  Medications Medications - No data to display  Mobility walks with person assist Moderate fall risk   Focused Assessments Cardiac Assessment Handoff:  Cardiac Rhythm: Sinus bradycardia, Normal sinus rhythm No results found for: CKTOTAL, CKMB, CKMBINDEX, TROPONINI No results found for: DDIMER Does the Patient currently have chest pain? Yes      R Recommendations: See Admitting Provider Note  Report given to:   Additional Notes: Pt stable - no chest pain. NPO after midnight- plan to have nuclear stress test tomorrow

## 2018-06-16 NOTE — ED Provider Notes (Signed)
[facility] EMERGENCY DEPARTMENT Provider Note   CSN: [id number] Arrival date & time: 06/16/18  0557    History   Chief Complaint Chief Complaint  Patient presents with  . Chest Pain    HPI [name] is a 78 y.o. female.     The history is provided by the patient and medical records. No language interpreter was used.  Chest Pain     78 year old obese female with history of chronic bronchitis, GERD, hypertension brought here via EMS from home for evaluation of chest pain.  Patient report for the past 3 to 4 months she is having increasing stress.  She states that she is stressed out because she has to go to the doctor frequently and she has to pay all the bills at home.  Furthermore she is scheduled to have a hysterectomy and a bladder tack in the near future which stresses her out even more.  For the past 3 nights she is having recurrent pain in her chest.  She described it as a indigestion sensation across her chest with tingling sensation to the tips of both of her hands and fingers.  She states pain usually improves when she prays.  Her last chest discomfort was yesterday morning lasting for approximately 30 minutes.  She is worried about how to pay her bills.  She mention last seen by her cardiologist in December and was told that her heart was doing fine.  She endorses history of GERD and states that her pain sometimes feels like indigestion, brought on by drinking water or eating food.  She also mentioned having chronic bladder infection and she is currently on an antibiotic for the past 3 months.  She denies any tobacco or alcohol use.  She cannot recall last heart catheterization or stress test.  She does not report any concerning associated factors such as lightheadedness, diaphoresis, nausea, shortness of breath with her chest discomfort.  Past Medical History:  Diagnosis Date  . Anemia    on iron  . Anxiety   . Arthritis    shoulders, knees, hands,  hips; status post left knee surgery  . Chronic bronchitis (HCC)    CXR 07/08/16  . Constipation   . Depression   . GERD (gastroesophageal reflux disease)    TAKES TUMS  . H/O: pneumonia 2003  . Heart murmur    ???   YEARS AGO; denies    . Hyperlipidemia    on med  . Hypertension   . Morbid obesity with BMI of 45.0-49.9, adult (HCC)   . Pneumonia    15-69YRS  . Shingles    recent 02/2017  . Thoracic aortic atherosclerosis (HCC)    CXR 07/08/16    Patient Active Problem List   Diagnosis Date Noted  . Primary osteoarthritis of left hip 01/20/2017  . Primary osteoarthritis of right hip 11/10/2015  . Anxiety state 11/10/2015  . Preop cardiovascular exam 07/23/2014  . Bilateral edema of lower extremity 07/18/2013    Class: Chronic  . Metabolic syndrome 07/18/2013  . Essential hypertension 07/07/2012  . GERD (gastroesophageal reflux disease) 07/07/2012  . Hyperlipidemia with target LDL less than 100 07/07/2012  . Rotator cuff tear, left 03/02/2012  . Osteoarthritis of left shoulder 01/21/2012    Past Surgical History:  Procedure Laterality Date  . CARDIAC CATHETERIZATION  04/20/1996   patent coronaries, EF 74% (Dr. Aram Candela)  . CARPAL TUNNEL RELEASE Left   . CATARACT EXTRACTION, BILATERAL    . COLONOSCOPY    .  KNEE SURGERY Left 11/2001  . REPLACEMENT TOTAL KNEE Left 10/2009  . REPLACEMENT TOTAL KNEE Left 10/2009  . SHOULDER OPEN ROTATOR CUFF REPAIR  03/01/2012   Procedure: ROTATOR CUFF REPAIR SHOULDER OPEN;  Surgeon: Mable Paris, MD;  Location: [facility] OR;  Service: Orthopedics;  Laterality: Left;  SUBSCAPULARIS REPAIR  . STRESS TEST - ADENOSINE THALLIUM  1997   apical and inferior segments with questionable infarct and ischemia in anteroseptal & inferoseptal segments (Dr. Standley Brooking)  . SVD     x 7  . TOTAL HIP ARTHROPLASTY Right 07/24/2016  . TOTAL HIP ARTHROPLASTY Right 07/24/2016   Procedure: RIGHT TOTAL HIP ARTHROPLASTY ANTERIOR APPROACH;  Surgeon: [name],  MD;  Location: [facility] OR;  Service: Orthopedics;  Laterality: Right;  . TOTAL HIP ARTHROPLASTY Left 04/09/2017   Procedure: LEFT TOTAL HIP ARTHROPLASTY ANTERIOR APPROACH;  Surgeon: [name], MD;  Location: [facility] ORS;  Service: Orthopedics;  Laterality: Left;  . TOTAL SHOULDER ARTHROPLASTY  01/18/2012   Procedure: TOTAL SHOULDER ARTHROPLASTY;  Surgeon: Mable Paris, MD;  Location: [facility] OR;  Service: Orthopedics;  Laterality: Left;  . TOTAL SHOULDER REPLACEMENT Left 01/19/2012  . TRANSTHORACIC ECHOCARDIOGRAM  06/2011   LV mildly dilated, EF=>55%; mild mitral annular calcif, mild MR; mild TR; AV mildly sclerotic, mild aortic regurg  . TUBAL LIGATION    . WISDOM TOOTH EXTRACTION       OB History   No obstetric history on file.      Home Medications    Prior to Admission medications   Medication Sig Start Date End Date Taking? Authorizing Provider  aspirin EC 325 MG EC tablet Take 1 tablet (325 mg total) by mouth 2 (two) times daily after a meal. Tate 1 month postop to decrease risk of blood clots. Patient not taking: Reported on 04/03/2018 04/12/17   Marshia Ly, PA-C  calcium carbonate (TUMS - DOSED IN MG ELEMENTAL CALCIUM) 500 MG chewable tablet Chew 2-4 tablets by mouth 2 (two) times daily as needed for indigestion or heartburn.    [provider]  docusate sodium (COLACE) 100 MG capsule Take 1 capsule (100 mg total) by mouth 2 (two) times daily. Patient taking differently: Take 100 mg by mouth daily.  07/24/16   Marshia Ly, PA-C  Ferrous Sulfate (IRON) 325 (65 Fe) MG TABS Take 1 tablet by mouth every morning.    [provider]  hydrOXYzine (ATARAX/VISTARIL) 25 MG tablet TAKE 1 TABLET BY MOUTH THREE TIMES A DAY AS NEEDED 04/18/18   Collie Siad A, MD  lisinopril (PRINIVIL,ZESTRIL) 20 MG tablet Take 1 tablet (20 mg total) by mouth daily. 01/08/18   [name], MD  rosuvastatin (CRESTOR) 40 MG tablet Take 1 tablet (40 mg total) by mouth daily. 03/21/18  06/19/18  [name], MD    Family History Family History  Problem Relation Age of Onset  . Heart disease Mother   . Heart disease Father   . Heart disease Sister   . Hypertension Sister   . Heart disease Sister   . Hypertension Sister   . Heart disease Sister   . Hypertension Sister     Social History Social History   Tobacco Use  . Smoking status: Former Smoker    Packs/day: 1.00    Years: 25.00    Pack years: 25.00    Types: Cigarettes    Last attempt to quit: 03/07/1984    Years since quitting: 34.2  . Smokeless tobacco: Former Engineer, water Use Topics  .  Alcohol use: No  . Drug use: No     Allergies   Effexor [venlafaxine hcl] and Sulfa drugs cross reactors   Review of Systems Review of Systems  Cardiovascular: Positive for chest pain.  All other systems reviewed and are negative.    Physical Exam Updated Vital Signs BP 100/69 (BP Location: Left Arm)   Pulse (!) 58   Temp 98.1 F (36.7 C) (Oral)   Resp 15   Ht 5' (1.524 m)   Wt 99.3 kg   SpO2 96%   BMI 42.77 kg/m   Physical Exam Vitals signs and nursing note reviewed.  Constitutional:      General: She is not in acute distress.    Appearance: She is well-developed. She is obese.  HENT:     Head: Atraumatic.  Eyes:     Conjunctiva/sclera: Conjunctivae normal.  Neck:     Musculoskeletal: Neck supple.  Cardiovascular:     Rate and Rhythm: Normal rate and regular rhythm.     Heart sounds: Normal heart sounds.  Pulmonary:     Effort: Pulmonary effort is normal.     Breath sounds: Normal breath sounds.  Chest:     Chest wall: No tenderness.  Abdominal:     Palpations: Abdomen is soft.     Tenderness: There is no abdominal tenderness.  Musculoskeletal: Normal range of motion.  Skin:    Findings: No rash.  Neurological:     Mental Status: She is alert and oriented to person, place, and time.      ED Treatments / Results  Labs (all labs ordered are listed, but only abnormal  results are displayed) Labs Reviewed  BASIC METABOLIC PANEL - Abnormal; Notable for the following components:      Result Value   GFR calc non Af Amer 59 (*)    All other components within normal limits  CBC - Abnormal; Notable for the following components:   Hemoglobin 11.7 (*)    All other components within normal limits  HEPATIC FUNCTION PANEL  LIPASE, BLOOD  I-STAT TROPONIN, ED  I-STAT TROPONIN, ED    EKG EKG Interpretation  Date/Time:  Thursday June 16 2018 06:04:29 EST Ventricular Rate:  59 PR Interval:    QRS Duration: 120 QT Interval:  454 QTC Calculation: 450 R Axis:   -18 Text Interpretation:  Sinus rhythm Nonspecific intraventricular conduction delay Borderline T abnormalities, anterior leads No significant change was found Confirmed by Glynn Octaveancour, Stephen [phone]([id number]) on 06/16/2018 6:09:00 AM Also confirmed by Glynn Octaveancour, Stephen [phone]), editor Barbette Hairassel, Kerry [phone]([id number])  on 06/16/2018 12:16:12 PM   Radiology Dg Chest 2 View  Result Date: 06/16/2018 CLINICAL DATA:  Chest pain EXAM: CHEST - 2 VIEW COMPARISON:  04/07/2017 FINDINGS: Chronic cardiomegaly and mild aortic tortuosity. There is no edema, consolidation, effusion, or pneumothorax. Glenohumeral arthroplasty on the left and severe osteoarthritis on the right. IMPRESSION: No acute finding. Cardiomegaly. Electronically Signed   By: Marnee SpringJonathon  Watts M.D.   On: 06/16/2018 06:50    Procedures Procedures (including critical care time)  Medications Ordered in ED Medications - No data to display   Initial Impression / Assessment and Plan / ED Course  I have reviewed the triage vital signs and the nursing notes.  Pertinent labs & imaging results that were available during my care of the patient were reviewed by me and considered in my medical decision making (see chart for details).        BP 95/67   Pulse (!) 54  Temp 98.1 F (36.7 C) (Oral)   Resp 18   Ht 5' (1.524 m)   Wt 99.3 kg   SpO2 99%   BMI 42.77 kg/m     Final Clinical Impressions(s) / ED Diagnoses   Final diagnoses:  Atypical chest pain    ED Discharge Orders    None     6:39 AM Patient here with chest pain atypical of ACS, last episode was yesterday morning.  She is currently chest pain-free.  Although her heart pathway score is 6 which puts her at moderate risk, the symptom does not suggest cardiac pathology.  Plan to obtain delta troponin and reassessment.  7:15 AM Appreciate consultation with cardiology who will see pt in the ER and help setup for cardiac stress test. Care discussed with Dr. Manus Gunning.   1:27 PM Cardiology has seen and will admit pt for stress test.   Fayrene Helper, PA-C 06/16/18 1328    Rancour, Jeannett Senior, MD 06/16/18 2014

## 2018-06-16 NOTE — ED Notes (Signed)
Patient transported to X-ray 

## 2018-06-17 ENCOUNTER — Observation Stay (HOSPITAL_BASED_OUTPATIENT_CLINIC_OR_DEPARTMENT_OTHER): Payer: MEDICARE

## 2018-06-17 DIAGNOSIS — E785 Hyperlipidemia, unspecified: Secondary | ICD-10-CM | POA: Diagnosis not present

## 2018-06-17 DIAGNOSIS — I10 Essential (primary) hypertension: Secondary | ICD-10-CM

## 2018-06-17 DIAGNOSIS — R079 Chest pain, unspecified: Secondary | ICD-10-CM | POA: Diagnosis not present

## 2018-06-17 HISTORY — PX: NM MYOVIEW LTD: HXRAD82

## 2018-06-17 LAB — CBC
HCT: 38.2 % (ref 36.0–46.0)
Hemoglobin: 12.1 g/dL (ref 12.0–15.0)
MCH: 30.8 pg (ref 26.0–34.0)
MCHC: 31.7 g/dL (ref 30.0–36.0)
MCV: 97.2 fL (ref 80.0–100.0)
Platelets: 230 10*3/uL (ref 150–400)
RBC: 3.93 MIL/uL (ref 3.87–5.11)
RDW: 14.6 % (ref 11.5–15.5)
WBC: 5.8 10*3/uL (ref 4.0–10.5)
nRBC: 0 % (ref 0.0–0.2)

## 2018-06-17 LAB — BASIC METABOLIC PANEL
Anion gap: 8 (ref 5–15)
BUN: 16 mg/dL (ref 8–23)
CO2: 23 mmol/L (ref 22–32)
Calcium: 9 mg/dL (ref 8.9–10.3)
Chloride: 108 mmol/L (ref 98–111)
Creatinine, Ser: 0.97 mg/dL (ref 0.44–1.00)
GFR calc Af Amer: >60 mL/min (ref >60)
GFR calc non Af Amer: 57 mL/min — ABNORMAL LOW (ref >60)
Glucose, Bld: 90 mg/dL (ref 70–99)
Potassium: 4.9 mmol/L (ref 3.5–5.1)
Sodium: 139 mmol/L (ref 135–145)

## 2018-06-17 LAB — NM MYOCAR MULTI W/SPECT W/WALL MOTION / EF
CSEPHR: 49 %
Estimated workload: 1 METS
Exercise duration (min): 0 min
Exercise duration (sec): 0 s
MPHR: 145 {beats}/min
Peak HR: 72 {beats}/min
RPE: 0
Rest HR: 55 {beats}/min

## 2018-06-17 MED ORDER — REGADENOSON 0.4 MG/5ML IV SOLN
INTRAVENOUS | Status: AC
  Filled 2018-06-17: qty 5

## 2018-06-17 MED ORDER — TECHNETIUM TC 99M TETROFOSMIN IV KIT
10.0000 | PACK | Freq: Once | INTRAVENOUS | Status: AC | PRN
  Administered 2018-06-17: 10 via INTRAVENOUS

## 2018-06-17 MED ORDER — MAGNESIUM HYDROXIDE 400 MG/5ML PO SUSP
30.0000 mL | Freq: Every day | ORAL | Status: DC | PRN
  Administered 2018-06-17: 30 mL via ORAL
  Filled 2018-06-17: qty 30

## 2018-06-17 MED ORDER — ASPIRIN 325 MG PO TBEC: 325.0000 mg | DELAYED_RELEASE_TABLET | Freq: Every day | ORAL | Status: DC

## 2018-06-17 MED ORDER — TECHNETIUM TC 99M TETROFOSMIN IV KIT
30.0000 | PACK | Freq: Once | INTRAVENOUS | Status: AC | PRN
  Administered 2018-06-17: 30 via INTRAVENOUS

## 2018-06-17 MED ORDER — REGADENOSON 0.4 MG/5ML IV SOLN
0.4000 mg | Freq: Once | INTRAVENOUS | Status: AC
  Administered 2018-06-17: 0.4 mg via INTRAVENOUS
  Filled 2018-06-17: qty 5

## 2018-06-17 NOTE — Progress Notes (Signed)
lexiscan portion completed without complications, results are to follow.

## 2018-06-17 NOTE — Progress Notes (Signed)
Stress test complete. Patient denies any complaints. Patient given snack and beverage. Patent taken back to [facility] to complete stress test.

## 2018-06-17 NOTE — Discharge Instructions (Signed)
The pain may be chest wall pain from coughing.    Continue medications.  It is OK for you to proceed with surgery.   Heart Healthy Diet.

## 2018-06-17 NOTE — Discharge Summary (Addendum)
Discharge Summary    Patient ID: [name] MRN: [id number]; DOB: 1940-10-07  Admit date: 06/16/2018 Discharge date: 06/17/2018  Primary Care Provider: [name], MD  Primary Cardiologist: Bryan Lemma, MD  Primary Electrophysiologist:  None   Discharge Diagnoses    Principal Problem:   Chest pain, negative stress test. non cardiac. Active Problems:   Essential hypertension   Hyperlipidemia with target LDL less than 100   Allergies Allergies  Allergen Reactions  . Effexor [Venlafaxine Hcl] Swelling and Other (See Comments)    MD ADVISES PATIENT TO NOT TAKE IN FUTURE PT STOPPED, MD DC'd MED  . Sulfa Drugs Cross Reactors Itching    Diagnostic Studies/Procedures    nuc study neg for ischemia se below _____________   History of Present Illness     78 y.o. female with a hx of HTN, HLD, morbid obesity, GERD, and anxiety, with recent bilateral hip surgery who presented to ER 06/16/18 with chest pain.  Also needs vaginal hysterectomy in near future.  Pt presented due to 3 night hx of chest pain.  She would wake every AM around 3 Am to go to [place] and would have chest pain, described as pressure in lt chest with radiation down her left arm.  Also with SOB, nausea and diaphoresis and lasts about an hour.  After carrying a laundry basket the pain increased.   Pain usually relieved with ASA and prayer.  pt has also been coughing hard over last 3 days.  Pain usually occurs after coughing.   her troponin was neg and EKG was stable but with need for surgery and recurrent pain she was admitted for serial troponin and stress test.  Hospital Course     Consultants: none  Pt did well overnight and troponins remained neg along with normal BNP. She underwent nuc study that was neg for ischemia.  She was seen and evaluated by Dr. Herbie Baltimore and found stable for discharge.  continue current meds.   Dr. Herbie Baltimore did note that she was low risk for upcoming surgery.    _____________  Discharge  Vitals Blood pressure 131/84, pulse 64, temperature 97.6 F (36.4 C), temperature source Oral, resp. rate 18, height 5' (1.524 m), weight 88.1 kg, SpO2 98 %.  Filed Weights   06/16/18 0609 06/16/18 1503 06/17/18 0532  Weight: 99.3 kg 87.5 kg 88.1 kg    Labs & Radiologic Studies    CBC Recent Labs    06/16/18 0616 06/17/18 0315  WBC 5.0 5.8  HGB 11.7* 12.1  HCT 38.0 38.2  MCV 96.7 97.2  PLT 226 230   Basic Metabolic Panel Recent Labs    61/95/09 0616 06/17/18 0315  NA 135 139  K 4.5 4.9  CL 102 108  CO2 24 23  GLUCOSE 96 90  BUN 18 16  CREATININE 0.95 0.97  CALCIUM 9.4 9.0   Liver Function Tests Recent Labs    06/16/18 0616  AST 27  ALT 15  ALKPHOS 60  BILITOT 0.5  PROT 6.9  ALBUMIN 3.5   Recent Labs    06/16/18 0616  LIPASE 26   Cardiac Enzymes No results for input(s): CKTOTAL, CKMB, CKMBINDEX, TROPONINI in the last 72 hours. BNP Invalid input(s): POCBNP D-Dimer No results for input(s): DDIMER in the last 72 hours. Hemoglobin A1C No results for input(s): HGBA1C in the last 72 hours. Fasting Lipid Panel No results for input(s): CHOL, HDL, LDLCALC, TRIG, CHOLHDL, LDLDIRECT in the last 72 hours. Thyroid Function Tests No results  for input(s): TSH, T4TOTAL, T3FREE, THYROIDAB in the last 72 hours.  Invalid input(s): FREET3 _____________  Dg Chest 2 View  Result Date: 06/16/2018 CLINICAL DATA:  Chest pain EXAM: CHEST - 2 VIEW COMPARISON:  04/07/2017 FINDINGS: Chronic cardiomegaly and mild aortic tortuosity. There is no edema, consolidation, effusion, or pneumothorax. Glenohumeral arthroplasty on the left and severe osteoarthritis on the right. IMPRESSION: No acute finding. Cardiomegaly. Electronically Signed   By: Marnee Spring M.D.   On: 06/16/2018 06:50   Nm Myocar Multi W/spect W/wall Motion / Ef  Result Date: 06/17/2018 CLINICAL DATA:  Chest pain.  Hypertension.  Hyperlipidemia. EXAM: MYOCARDIAL IMAGING WITH SPECT (REST AND PHARMACOLOGIC-STRESS)  GATED LEFT VENTRICULAR WALL MOTION STUDY LEFT VENTRICULAR EJECTION FRACTION TECHNIQUE: Standard myocardial SPECT imaging was performed after resting intravenous injection of 10 mCi Tc-75m tetrofosmin. Subsequently, intravenous infusion of Lexiscan was performed under the supervision of the Cardiology staff. At peak effect of the drug, 30 mCi Tc-72m tetrofosmin was injected intravenously and standard myocardial SPECT imaging was performed. Quantitative gated imaging was also performed to evaluate left ventricular wall motion, and estimate left ventricular ejection fraction. COMPARISON:  None. FINDINGS: Perfusion: Decreased perfusion is noted at the apex on both rest and stress images. There is no significant reversibility. Perfusion is otherwise normal. Wall Motion: Normal left ventricular wall motion. No left ventricular dilation. Left Ventricular Ejection Fraction: 61 % End diastolic volume 96 ml End systolic volume 37 ml IMPRESSION: 1. Decreased perfusion at the apex consistent with prior infarct. No reversible perfusion defect is present to suggest ischemia under stress. 2. Normal left ventricular wall motion. 3. Left ventricular ejection fraction 61% 4. Non invasive risk stratification*: Low *2012 Appropriate Use Criteria for Coronary Revascularization Focused Update: J Am Coll Cardiol. 2012;59(9):857-881. http://content.dementiazones.com.aspx?articleid=[email or web address] Electronically Signed   By: Marin Roberts M.D.   On: 06/17/2018 14:27   Disposition   Pt is being discharged home today in good condition.  Follow-up Plans & Appointments   The pain may be chest wall pain from coughing.    Continue medications.  It is OK for you to proceed with surgery.   Heart Healthy Diet.   Follow-up Information    [name], MD Follow up on 07/18/2018.   Specialty:  Cardiology Why:  at 10:30 please arrive by 10:15 AM  with his NP Dr. Joni Reining. Contact information: [address] [place] [address] [phone]            Discharge Medications   Allergies as of 06/17/2018      Reactions   Effexor [venlafaxine Hcl] Swelling, Other (See Comments)   MD ADVISES PATIENT TO NOT TAKE IN FUTURE PT STOPPED, MD DC'd MED   Sulfa Drugs Cross Reactors Itching      Medication List    TAKE these medications   aspirin 325 MG EC tablet Take 1 tablet (325 mg total) by mouth daily.   docusate sodium 100 MG capsule Commonly known as:  Colace Take 1 capsule (100 mg total) by mouth 2 (two) times daily. What changed:  when to take this   hydrOXYzine 25 MG tablet Commonly known as:  ATARAX/VISTARIL TAKE 1 TABLET BY MOUTH THREE TIMES A DAY AS NEEDED What changed:    when to take this  reasons to take this   Iron 325 (65 Fe) MG Tabs Take 1 tablet by mouth daily.   lisinopril 20 MG tablet Commonly known as:  PRINIVIL,ZESTRIL Take 1 tablet (20 mg total) by mouth daily.  rosuvastatin 40 MG tablet Commonly known as:  CRESTOR Take 1 tablet (40 mg total) by mouth daily.   trimethoprim 100 MG tablet Commonly known as:  TRIMPEX Take 100 mg by mouth daily.        Acute coronary syndrome (MI, NSTEMI, STEMI, etc) this admission?: No.    Outstanding Labs/Studies   None   Duration of Discharge Encounter   Greater than 30 minutes including physician time.  Signed, Nada Boozer, NP 06/17/2018, 4:43 PM   Pt seen & examined after Nuclear ST.  Breast attenuation noted. Non-anginal CP.    See last PN for details.  Ok for d/c.   Bryan Lemma, MD

## 2018-06-17 NOTE — Progress Notes (Addendum)
Progress Note  Patient Name: [name] Date of Encounter: 06/17/2018  Primary Cardiologist: Bryan Lemma, MD   Subjective   No chest pain or dyspnea overnight.   Inpatient Medications    Scheduled Meds: . aspirin EC  81 mg Oral Daily  . docusate sodium  100 mg Oral BID  . ferrous sulfate  325 mg Oral Q breakfast  . heparin  5,000 Units Subcutaneous Q8H  . lisinopril  20 mg Oral Daily  . rosuvastatin  40 mg Oral Daily  . trimethoprim  100 mg Oral Daily   Continuous Infusions:  PRN Meds: acetaminophen, hydrOXYzine, nitroGLYCERIN, ondansetron (ZOFRAN) IV   Vital Signs    Vitals:   06/16/18 1503 06/16/18 2026 06/17/18 0532 06/17/18 0535  BP: 137/75 104/63  124/84  Pulse: (!) 58 (!) 55  69  Resp: 20   18  Temp: 97.6 F (36.4 C) 97.6 F (36.4 C)  97.9 F (36.6 C)  TempSrc: Oral Oral  Oral  SpO2: 100% 98%  96%  Weight: 87.5 kg  88.1 kg   Height: 5' (1.524 m)       Intake/Output Summary (Last 24 hours) at 06/17/2018 0750 Last data filed at 06/17/2018 0000 Gross per 24 hour  Intake 462 ml  Output 600 ml  Net -138 ml   Last 3 Weights 06/17/2018 06/16/2018 06/16/2018  Weight (lbs) 194 lb 3.2 oz 193 lb 219 lb  Weight (kg) 88.089 kg 87.544 kg 99.338 kg      Telemetry    Sinus rhythm at rate of 50-60s, V. Bigeminy intermittently - Personally Reviewed  ECG    No new tracing   Physical Exam   GEN: No acute distress.   Neck: No JVD Cardiac: RRR, no murmurs, rubs, or gallops.  Respiratory: Clear to auscultation bilaterally. GI: Soft, nontender, non-distended  MS: No edema; No deformity. Neuro:  Nonfocal  Psych: Normal affect   Labs    Chemistry Recent Labs  Lab 06/16/18 0616 06/17/18 0315  NA 135 139  K 4.5 4.9  CL 102 108  CO2 24 23  GLUCOSE 96 90  BUN 18 16  CREATININE 0.95 0.97  CALCIUM 9.4 9.0  PROT 6.9  --   ALBUMIN 3.5  --   AST 27  --   ALT 15  --   ALKPHOS 60  --   BILITOT 0.5  --   GFRNONAA 59* 57*  GFRAA >60 >60  ANIONGAP 9 8     Hematology Recent Labs  Lab 06/16/18 0616 06/17/18 0315  WBC 5.0 5.8  RBC 3.93 3.93  HGB 11.7* 12.1  HCT 38.0 38.2  MCV 96.7 97.2  MCH 29.8 30.8  MCHC 30.8 31.7  RDW 14.4 14.6  PLT 226 230     Recent Labs  Lab 06/16/18 0633 06/16/18 1022  TROPIPOC 0.01 0.01     BNP Recent Labs  Lab 06/16/18 0616  BNP 25.9     Radiology    Dg Chest 2 View  Result Date: 06/16/2018 CLINICAL DATA:  Chest pain EXAM: CHEST - 2 VIEW COMPARISON:  04/07/2017 FINDINGS: Chronic cardiomegaly and mild aortic tortuosity. There is no edema, consolidation, effusion, or pneumothorax. Glenohumeral arthroplasty on the left and severe osteoarthritis on the right. IMPRESSION: No acute finding. Cardiomegaly. Electronically Signed   By: Marnee Spring M.D.   On: 06/16/2018 06:50    Cardiac Studies   Echo 06/16/18 IMPRESSIONS    1. The left ventricle has normal systolic function with an ejection fraction  of 60-65%. The cavity size was normal. Left ventricular diastolic Doppler parameters are consistent with impaired relaxation.  2. The right ventricle has normal systolic function. The cavity was normal. There is no increase in right ventricular wall thickness.  3. The mitral valve is normal in structure.  4. The tricuspid valve is normal in structure.  5. The aortic valve is tricuspid Mild sclerosis of the aortic valve. Aortic valve regurgitation is trivial by color flow Doppler.  6. The pulmonic valve was normal in structure.  Pending stress test   Patient Profile     [name] is a 78 y.o. female with a hx of HTN, HLD, morbid obesity, GERD, and anxiety, with recent bilateral hip surgery who presented for evaluation of chest pain. Very anxious about her medical procedure.   Assessment & Plan    1. Chest pain  - presented with typical and atypical features. Echo showed LVEF of 60-65% and mild diastolic dysfunction. Pending stress test. If no ischemia, plan to DC home later today.  -  Troponin negative. BNP normal.   2. HTN - BP stable on current medications  3. HLD - 02/21/2018: Cholesterol, Total 181; HDL 87; LDL Calculated 84; Triglycerides 49  - Continue Crestor 40mg   For questions or updates, please contact [facility] Please consult [email or web address] for contact info under      Signed, Manson Passey, PA  06/17/2018, 7:50 AM    ATTENDING ATTESTATION  I have seen, examined and evaluated the patient this PM after her Myoview ST.  After reviewing all the available data and chart, we discussed the patients laboratory, study & physical findings as well as symptoms in detail. I agree with Mr. Rollene Fare findings, examination as well as impression recommendations as per our discussion.    Myoview personally reviewed -- LOW RISK  Official Myoview read: EF 61%.  Apical perfusion defect seen on both rest and stress images  -> LOW RISK.  No regional wall motion normality (Apical defect is likely consistent with breast attenuation without regional wall motion normality)  No ischemia on stress test.  Likely nonanginal chest pain.  Okay for discharge home.  This test was done also partly for preoperative assessment.  This would make her low risk from a cardiac standpoint for her upcoming surgery.  Bryan Lemma, M.D., M.S. Interventional Cardiologist   Pager # [phone] Phone # [phone] [address] [place], [place] [address]

## 2018-07-11 ENCOUNTER — Ambulatory Visit: Payer: MEDICARE | Admitting: Psychology

## 2018-07-13 ENCOUNTER — Telehealth: Payer: Self-pay

## 2018-07-13 NOTE — Telephone Encounter (Signed)

## 2018-07-13 NOTE — Telephone Encounter (Signed)
LMVM pt has appt Monday and she will need to keep and make a televisit or video visit. BP, hr, weight, email??? 

## 2018-07-13 NOTE — Telephone Encounter (Signed)
Error----> we will meed to reschedule appt out to 6 months COVID

## 2018-07-18 ENCOUNTER — Ambulatory Visit: Payer: MEDICARE | Admitting: Adult Health

## 2018-07-19 DIAGNOSIS — N3946 Mixed incontinence: Secondary | ICD-10-CM | POA: Diagnosis not present

## 2018-07-19 DIAGNOSIS — N8111 Cystocele, midline: Secondary | ICD-10-CM | POA: Diagnosis not present

## 2018-08-29 ENCOUNTER — Ambulatory Visit: Payer: MEDICARE | Admitting: Family Medicine

## 2018-09-12 ENCOUNTER — Ambulatory Visit: Payer: Self-pay | Admitting: Psychology

## 2018-09-19 ENCOUNTER — Other Ambulatory Visit: Payer: Self-pay | Admitting: Family Medicine

## 2018-09-30 ENCOUNTER — Encounter: Payer: Self-pay | Admitting: Family Medicine

## 2018-09-30 ENCOUNTER — Other Ambulatory Visit: Payer: Self-pay

## 2018-09-30 ENCOUNTER — Ambulatory Visit (INDEPENDENT_AMBULATORY_CARE_PROVIDER_SITE_OTHER): Payer: MEDICARE | Admitting: Family Medicine

## 2018-09-30 VITALS — BP 117/79 | HR 71 | Temp 98.6°F | Resp 16 | Ht 60.0 in | Wt 192.6 lb

## 2018-09-30 DIAGNOSIS — Z09 Encounter for follow-up examination after completed treatment for conditions other than malignant neoplasm: Secondary | ICD-10-CM

## 2018-09-30 DIAGNOSIS — N8189 Other female genital prolapse: Secondary | ICD-10-CM

## 2018-09-30 DIAGNOSIS — E785 Hyperlipidemia, unspecified: Secondary | ICD-10-CM | POA: Diagnosis not present

## 2018-09-30 DIAGNOSIS — R0789 Other chest pain: Secondary | ICD-10-CM

## 2018-09-30 MED ORDER — ROSUVASTATIN CALCIUM 40 MG PO TABS: 40.0000 mg | ORAL_TABLET | Freq: Every day | ORAL | 3 refills | Status: DC

## 2018-09-30 NOTE — Progress Notes (Addendum)
Established Patient Office Visit  Subjective:  Patient ID: [name], female    DOB: [birth year]  Age: 78 y.o. MRN: [id number]  CC:  Chief Complaint  Patient presents with  . medical conditions    follow up  . Medication Refill    rosuvastatin    HPI [name] presents for pt was evaluated in the ER for chest pains. She was kept as an observation and then discharged to home.  She saw Cardiology as well. It was figured that her chest pain was from lifting wet clothes.  Her duaghter now does the laundry No current chest pain, sob, palpitations  Pelvic Organ Prolapse She has a pelvic organ prolaspe and feel horrible all the time, it causes her to be constipated. She denies any diarrhes. She uses milk of magnesia when she is constipated She has urinary frequency and never feels truly empty. She denies flank pain, or back pain.  No hematuria.   Dyslipidemia She has high cholesterol She has been taking all the medications prescribed.  She denies myalgias   Past Medical History:  Diagnosis Date  . Anemia    on iron  . Anxiety   . Arthritis    shoulders, knees, hands, hips; status post left knee surgery  . Chest pain at rest 06/16/2018  . Chronic bronchitis (HCC)    CXR 07/08/16  . Depression   . GERD (gastroesophageal reflux disease)    TAKES TUMS  . H/O: pneumonia 2003  . Hyperlipidemia    on med  . Hypertension   . Morbid obesity with BMI of 45.0-49.9, adult (HCC)   . Shingles    recent 02/2017  . Thoracic aortic atherosclerosis (HCC)    CXR 07/08/16    Past Surgical History:  Procedure Laterality Date  . CARDIAC CATHETERIZATION  04/20/1996   patent coronaries, EF 74% (Dr. Aram CandelaW. Gamble)  . CARPAL TUNNEL RELEASE Left   . CATARACT EXTRACTION, BILATERAL    . COLONOSCOPY    . KNEE SURGERY Left 11/2001  . REPLACEMENT TOTAL KNEE Left 10/2009  . REPLACEMENT TOTAL KNEE Left 10/2009  . SHOULDER OPEN ROTATOR CUFF REPAIR  03/01/2012   Procedure: ROTATOR CUFF REPAIR  SHOULDER OPEN;  Surgeon: Mable ParisJustin William Chandler, MD;  [facility];  Service: Orthopedics;  Laterality: Left;  SUBSCAPULARIS REPAIR  . STRESS TEST - ADENOSINE THALLIUM  1997   apical and inferior segments with questionable infarct and ischemia in anteroseptal & inferoseptal segments (Dr. Standley Brooking. Shepherd)  . SVD     x 7  . TOTAL HIP ARTHROPLASTY Right 07/24/2016  . TOTAL HIP ARTHROPLASTY Right 07/24/2016   Procedure: RIGHT TOTAL HIP ARTHROPLASTY ANTERIOR APPROACH;  Surgeon: Jodi GeraldsJohn Graves, MD;  Location: [facility] OR;  Service: Orthopedics;  Laterality: Right;  . TOTAL HIP ARTHROPLASTY Left 04/09/2017   Procedure: LEFT TOTAL HIP ARTHROPLASTY ANTERIOR APPROACH;  Surgeon: Jodi GeraldsGraves, John, MD;  Location: [facility] ORS;  Service: Orthopedics;  Laterality: Left;  . TOTAL SHOULDER ARTHROPLASTY  01/18/2012   Procedure: TOTAL SHOULDER ARTHROPLASTY;  Surgeon: Mable ParisJustin William Chandler, MD;  [facility];  Service: Orthopedics;  Laterality: Left;  . TOTAL SHOULDER REPLACEMENT Left 01/19/2012  . TRANSTHORACIC ECHOCARDIOGRAM  06/2011   LV mildly dilated, EF=>55%; mild mitral annular calcif, mild MR; mild TR; AV mildly sclerotic, mild aortic regurg  . TUBAL LIGATION    . WISDOM TOOTH EXTRACTION      Family History  Problem Relation Age of Onset  . Heart disease Mother   . Heart  disease Father   . Heart disease Sister   . Hypertension Sister   . Heart disease Sister   . Hypertension Sister   . Heart disease Sister   . Hypertension Sister     Social History   Socioeconomic History  . Marital status: Widowed    Spouse name: Not on file  . Number of children: 7  . Years of education: Not on file  . Highest education level: Not on file  Occupational History  . Occupation: retired    Associate Professormployer: RETIRED  Social Needs  . Financial resource strain: Not on file  . Food insecurity    Worry: Not on file    Inability: Not on file  . Transportation needs    Medical: Not on file    Non-medical: Not on file  Tobacco  Use  . Smoking status: Former Smoker    Packs/day: 1.00    Years: 25.00    Pack years: 25.00    Types: Cigarettes    Quit date: 03/07/1984    Years since quitting: 34.5  . Smokeless tobacco: Former Engineer, waterUser  Substance and Sexual Activity  . Alcohol use: No  . Drug use: No  . Sexual activity: Not Currently    Birth control/protection: Surgical  Lifestyle  . Physical activity    Days per week: Not on file    Minutes per session: Not on file  . Stress: Not on file  Relationships  . Social Musicianconnections    Talks on phone: Not on file    Gets together: Not on file    Attends religious service: Not on file    Active member of club or organization: Not on file    Attends meetings of clubs or organizations: Not on file    Relationship status: Not on file  . Intimate partner violence    Fear of current or ex partner: Not on file    Emotionally abused: Not on file    Physically abused: Not on file    Forced sexual activity: Not on file  Other Topics Concern  . Not on file  Social History Narrative   Marital status:   She is a widowed since 2009; married 45 years      Children:   mother of 637, grandmother of 5.      Lives: alone with home; daughter, son lives with patient      Tobacco: quit smoking 34 years ago      Alcohol: none      ADLs:  Walks with cane; drives.     She does not drink alcohol or smoke cigarettes.    Outpatient Medications Prior to Visit  Medication Sig Dispense Refill  . aspirin 325 MG EC tablet Take 1 tablet (325 mg total) by mouth daily.    Marland Kitchen. docusate sodium (COLACE) 100 MG capsule Take 1 capsule (100 mg total) by mouth 2 (two) times daily. (Patient taking differently: Take 100 mg by mouth daily. ) 30 capsule 0  . Ferrous Sulfate (IRON) 325 (65 Fe) MG TABS Take 1 tablet by mouth daily.     . hydrOXYzine (ATARAX/VISTARIL) 25 MG tablet TAKE 1 TABLET BY MOUTH THREE TIMES A DAY AS NEEDED (Patient taking differently: Take 25 mg by mouth every 8 (eight) hours as needed  for anxiety. ) 90 tablet 3  . lisinopril (PRINIVIL,ZESTRIL) 20 MG tablet Take 1 tablet (20 mg total) by mouth daily. 90 tablet 1  . rosuvastatin (CRESTOR) 40 MG tablet TAKE 1 TABLET  BY MOUTH EVERY DAY 90 tablet 0  . trimethoprim (TRIMPEX) 100 MG tablet Take 100 mg by mouth daily.     No facility-administered medications prior to visit.     Allergies  Allergen Reactions  . Effexor [Venlafaxine Hcl] Swelling and Other (See Comments)    MD ADVISES PATIENT TO NOT TAKE IN FUTURE PT STOPPED, MD DC'd MED  . Sulfa Drugs Cross Reactors Itching    ROS Review of Systems Review of Systems  Constitutional: Negative for activity change, appetite change, chills and fever.  HENT: Negative for congestion, nosebleeds, trouble swallowing and voice change.   Respiratory: Negative for cough, shortness of breath and wheezing.   Gastrointestinal: Negative for diarrhea, nausea and vomiting.  Genitourinary: see hpi Musculoskeletal: Negative for back pain, joint swelling and neck pain.  Neurological: Negative for dizziness, speech difficulty, light-headedness and numbness.  See HPI. All other review of systems negative.     Objective:    Physical Exam  BP 117/79 (BP Location: Left Arm, Patient Position: Sitting, Cuff Size: Large)   Pulse 71   Temp 98.6 F (37 C) (Oral)   Resp 16   Ht 5' (1.524 m)   Wt 192 lb 9.6 oz (87.4 kg)   SpO2 97%   BMI 37.61 kg/m  Wt Readings from Last 3 Encounters:  09/30/18 192 lb 9.6 oz (87.4 kg)  06/17/18 194 lb 3.2 oz (88.1 kg)  03/30/18 199 lb 12.8 oz (90.6 kg)   Physical Exam  Constitutional: Oriented to person, place, and time. Appears well-developed and well-nourished.  HENT:  Head: Normocephalic and atraumatic.  Eyes: Conjunctivae and EOM are normal.  Cardiovascular: Normal rate, regular rhythm, normal heart sounds and intact distal pulses.  No murmur heard. Pulmonary/Chest: Effort normal and breath sounds normal. No stridor. No respiratory distress. Has  no wheezes.  Neurological: Is alert and oriented to person, place, and time.  Skin: Skin is warm. Capillary refill takes less than 2 seconds.  Psychiatric: Has a normal mood and affect. Behavior is normal. Judgment and thought content normal.    Health Maintenance Due  Topic Date Due  . DEXA SCAN  09/03/2007    There are no preventive care reminders to display for this patient.  Lab Results  Component Value Date   TSH 0.85 06/13/2015   Lab Results  Component Value Date   WBC 5.8 06/17/2018   HGB 12.1 06/17/2018   HCT 38.2 06/17/2018   MCV 97.2 06/17/2018   PLT 230 06/17/2018   Lab Results  Component Value Date   NA 139 06/17/2018   K 4.9 06/17/2018   CO2 23 06/17/2018   GLUCOSE 90 06/17/2018   BUN 16 06/17/2018   CREATININE 0.97 06/17/2018   BILITOT 0.5 06/16/2018   ALKPHOS 60 06/16/2018   AST 27 06/16/2018   ALT 15 06/16/2018   PROT 6.9 06/16/2018   ALBUMIN 3.5 06/16/2018   CALCIUM 9.0 06/17/2018   ANIONGAP 8 06/17/2018   Lab Results  Component Value Date   CHOL 181 02/21/2018   Lab Results  Component Value Date   HDL 87 02/21/2018   Lab Results  Component Value Date   LDLCALC 84 02/21/2018   Lab Results  Component Value Date   TRIG 49 02/21/2018   Lab Results  Component Value Date   CHOLHDL 2.1 02/21/2018   Lab Results  Component Value Date   HGBA1C 5.8 05/01/2012      Assessment & Plan:   Problem List Items Addressed This Visit  Other   Chest pain, negative stress test. non cardiac. - resolved    Other Visit Diagnoses    Hospital discharge follow-up    -  Primary reviewd notes and labs with patient Her pain resolved    Other female genital prolapse    -  Follow up       Meds ordered this encounter  Medications  . rosuvastatin (CRESTOR) 40 MG tablet    Sig: Take 1 tablet (40 mg total) by mouth daily.    Dispense:  90 tablet    Refill:  3    Follow-up: No follow-ups on file.   A total of 25 minutes were spent  face-to-face with the patient during this encounter and over half of that time was spent on counseling and coordination of care.   Doristine BosworthZoe A Stanley Helmuth, MD

## 2018-09-30 NOTE — Patient Instructions (Addendum)
Your blood is in a good range Your work up was negative for heart attack You would be safe to have your surgery with Gynecology.  No heavy lifting     If you have lab work done today you will be contacted with your lab results within the next 2 weeks.  If you have not heard from [place] then please contact us. The fastest way to get your results is to register for My Chart.   IF you received an x-ray today, you will receive an invoice from [facility]. Please contact [facility] at [phone] with questions or concerns regarding your invoice.   IF you received labwork today, you will receive an invoice from [facility]. Please contact [facility] at [phone] with questions or concerns regarding your invoice.   Our billing staff will not be able to assist you with questions regarding bills from these companies.  You will be contacted with the lab results as soon as they are available. The fastest way to get your results is to activate your My Chart account. Instructions are located on the last page of this paperwork. If you have not heard from [place] regarding the results in 2 weeks, please contact this office.

## 2018-10-05 ENCOUNTER — Ambulatory Visit (INDEPENDENT_AMBULATORY_CARE_PROVIDER_SITE_OTHER): Payer: MEDICARE | Admitting: Psychology

## 2018-10-05 ENCOUNTER — Ambulatory Visit: Payer: MEDICARE | Admitting: Psychology

## 2018-10-05 DIAGNOSIS — F4323 Adjustment disorder with mixed anxiety and depressed mood: Secondary | ICD-10-CM

## 2018-10-06 DIAGNOSIS — N816 Rectocele: Secondary | ICD-10-CM | POA: Diagnosis not present

## 2018-10-06 DIAGNOSIS — N8111 Cystocele, midline: Secondary | ICD-10-CM | POA: Diagnosis not present

## 2018-10-13 DIAGNOSIS — R1900 Intra-abdominal and pelvic swelling, mass and lump, unspecified site: Secondary | ICD-10-CM | POA: Diagnosis not present

## 2018-10-13 DIAGNOSIS — N39498 Other specified urinary incontinence: Secondary | ICD-10-CM | POA: Diagnosis not present

## 2018-10-13 DIAGNOSIS — N816 Rectocele: Secondary | ICD-10-CM | POA: Diagnosis not present

## 2018-10-13 DIAGNOSIS — N8111 Cystocele, midline: Secondary | ICD-10-CM | POA: Diagnosis not present

## 2018-10-27 ENCOUNTER — Ambulatory Visit (INDEPENDENT_AMBULATORY_CARE_PROVIDER_SITE_OTHER): Payer: MEDICARE | Admitting: Psychology

## 2018-10-27 DIAGNOSIS — F4323 Adjustment disorder with mixed anxiety and depressed mood: Secondary | ICD-10-CM

## 2018-11-06 ENCOUNTER — Other Ambulatory Visit: Payer: Self-pay | Admitting: Family Medicine

## 2018-11-06 NOTE — Telephone Encounter (Signed)
Requested Prescriptions  Pending Prescriptions Disp Refills  . hydrOXYzine (ATARAX/VISTARIL) 25 MG tablet [Pharmacy Med Name: HYDROXYZINE HCL 25 MG TABLET] 90 tablet 3    Sig: TAKE 1 TABLET BY MOUTH THREE TIMES A DAY AS NEEDED     Ear, Nose, and Throat:  Antihistamines Passed - 11/06/2018  1:00 AM      Passed - Valid encounter within last 12 months    Recent Outpatient Visits          1 month ago Hospital discharge follow-up   Primary Care at [facility], [place] A, MD   8 months ago Hyperlipidemia with target LDL less than 100   Primary Care at [facility], Arlie Solomons, MD   10 months ago Complicated grieving   Primary Care at [facility], Arlie Solomons, MD   1 year ago Essential hypertension   Primary Care at [facility], Renette Butters, MD   1 year ago Essential hypertension   Primary Care at [facility], Renette Butters, MD

## 2018-11-29 ENCOUNTER — Ambulatory Visit (INDEPENDENT_AMBULATORY_CARE_PROVIDER_SITE_OTHER): Payer: MEDICARE | Admitting: Psychology

## 2018-11-29 DIAGNOSIS — F4323 Adjustment disorder with mixed anxiety and depressed mood: Secondary | ICD-10-CM

## 2018-12-13 ENCOUNTER — Encounter (HOSPITAL_COMMUNITY): Payer: Self-pay | Admitting: Emergency Medicine

## 2018-12-13 ENCOUNTER — Emergency Department (HOSPITAL_COMMUNITY)
Admission: EM | Admit: 2018-12-13 | Discharge: 2018-12-13 | Disposition: A | Discharge status: Home / Self Care | Payer: MEDICARE | Attending: Emergency Medicine | Admitting: Emergency Medicine

## 2018-12-13 ENCOUNTER — Other Ambulatory Visit: Payer: Self-pay

## 2018-12-13 DIAGNOSIS — K5900 Constipation, unspecified: Secondary | ICD-10-CM | POA: Diagnosis not present

## 2018-12-13 DIAGNOSIS — K625 Hemorrhage of anus and rectum: Secondary | ICD-10-CM | POA: Diagnosis present

## 2018-12-13 DIAGNOSIS — Z96641 Presence of right artificial hip joint: Secondary | ICD-10-CM | POA: Diagnosis not present

## 2018-12-13 DIAGNOSIS — Z7982 Long term (current) use of aspirin: Secondary | ICD-10-CM | POA: Diagnosis not present

## 2018-12-13 DIAGNOSIS — Z87891 Personal history of nicotine dependence: Secondary | ICD-10-CM | POA: Insufficient documentation

## 2018-12-13 DIAGNOSIS — Z96652 Presence of left artificial knee joint: Secondary | ICD-10-CM | POA: Insufficient documentation

## 2018-12-13 DIAGNOSIS — Z96642 Presence of left artificial hip joint: Secondary | ICD-10-CM | POA: Diagnosis not present

## 2018-12-13 DIAGNOSIS — Z79899 Other long term (current) drug therapy: Secondary | ICD-10-CM | POA: Diagnosis not present

## 2018-12-13 DIAGNOSIS — I10 Essential (primary) hypertension: Secondary | ICD-10-CM | POA: Diagnosis not present

## 2018-12-13 DIAGNOSIS — N814 Uterovaginal prolapse, unspecified: Secondary | ICD-10-CM | POA: Insufficient documentation

## 2018-12-13 DIAGNOSIS — Z96612 Presence of left artificial shoulder joint: Secondary | ICD-10-CM | POA: Diagnosis not present

## 2018-12-13 LAB — CBC
HCT: 37.6 % (ref 36.0–46.0)
Hemoglobin: 11.7 g/dL — ABNORMAL LOW (ref 12.0–15.0)
MCH: 29.9 pg (ref 26.0–34.0)
MCHC: 31.1 g/dL (ref 30.0–36.0)
MCV: 96.2 fL (ref 80.0–100.0)
Platelets: 232 10*3/uL (ref 150–400)
RBC: 3.91 MIL/uL (ref 3.87–5.11)
RDW: 14.2 % (ref 11.5–15.5)
WBC: 5.7 10*3/uL (ref 4.0–10.5)
nRBC: 0 % (ref 0.0–0.2)

## 2018-12-13 LAB — COMPREHENSIVE METABOLIC PANEL
ALT: 11 U/L (ref 0–44)
AST: 21 U/L (ref 15–41)
Albumin: 4 g/dL (ref 3.5–5.0)
Alkaline Phosphatase: 53 U/L (ref 38–126)
Anion gap: 11 (ref 5–15)
BUN: 17 mg/dL (ref 8–23)
CO2: 28 mmol/L (ref 22–32)
Calcium: 11.1 mg/dL — ABNORMAL HIGH (ref 8.9–10.3)
Chloride: 102 mmol/L (ref 98–111)
Creatinine, Ser: 0.85 mg/dL (ref 0.44–1.00)
GFR calc Af Amer: >60 mL/min (ref >60)
GFR calc non Af Amer: >60 mL/min (ref >60)
Glucose, Bld: 98 mg/dL (ref 70–99)
Potassium: 4.2 mmol/L (ref 3.5–5.1)
Sodium: 141 mmol/L (ref 135–145)
Total Bilirubin: 0.4 mg/dL (ref 0.3–1.2)
Total Protein: 7.3 g/dL (ref 6.5–8.1)

## 2018-12-13 MED ORDER — POLYETHYLENE GLYCOL 3350 17 GM/SCOOP PO POWD: ORAL | 0 refills | Status: DC

## 2018-12-13 NOTE — ED Provider Notes (Signed)
[facility]-EMERGENCY DEPT Provider Note   CSN: [id number] Arrival date & time: 12/13/18  [id number]     History   Chief Complaint Chief Complaint  Patient presents with  . Rectal Bleeding    HPI [name] is a 78 y.o. female.     78 year old female with past medical history below including cystocele who presents with bleeding. PT was straining to have a BM this morning when she noted some bright red blood and got scared. She denies any complaints of pain. No change in urination. She follows w/ Dr. Sherron MondayMacDiarmid for her urinary issues. No anticoagulant use.  The history is provided by the patient.  Rectal Bleeding   Past Medical History:  Diagnosis Date  . Anemia    on iron  . Anxiety   . Arthritis    shoulders, knees, hands, hips; status post left knee surgery  . Chest pain at rest 06/16/2018  . Chronic bronchitis (HCC)    CXR 07/08/16  . Depression   . GERD (gastroesophageal reflux disease)    TAKES TUMS  . H/O: pneumonia 2003  . Hyperlipidemia    on med  . Hypertension   . Morbid obesity with BMI of 45.0-49.9, adult (HCC)   . Shingles    recent 02/2017  . Thoracic aortic atherosclerosis (HCC)    CXR 07/08/16    Patient Active Problem List   Diagnosis Date Noted  . Chest pain, negative stress test. non cardiac. 06/16/2018  . Primary osteoarthritis of left hip 01/20/2017  . Primary osteoarthritis of right hip 11/10/2015  . Anxiety state 11/10/2015  . Preop cardiovascular exam 07/23/2014  . Bilateral edema of lower extremity 07/18/2013    Class: Chronic  . Metabolic syndrome 07/18/2013  . Essential hypertension 07/07/2012  . GERD (gastroesophageal reflux disease) 07/07/2012  . Hyperlipidemia with target LDL less than 100 07/07/2012  . Rotator cuff tear, left 03/02/2012  . Osteoarthritis of left shoulder 01/21/2012    Past Surgical History:  Procedure Laterality Date  . CARDIAC CATHETERIZATION  04/20/1996   patent coronaries, EF 74% (Dr.  Aram CandelaW. Gamble)  . CARPAL TUNNEL RELEASE Left   . CATARACT EXTRACTION, BILATERAL    . COLONOSCOPY    . KNEE SURGERY Left 11/2001  . REPLACEMENT TOTAL KNEE Left 10/2009  . REPLACEMENT TOTAL KNEE Left 10/2009  . SHOULDER OPEN ROTATOR CUFF REPAIR  03/01/2012   Procedure: ROTATOR CUFF REPAIR SHOULDER OPEN;  Surgeon: Mable ParisJustin William Chandler, MD;  [facility];  Service: Orthopedics;  Laterality: Left;  SUBSCAPULARIS REPAIR  . STRESS TEST - ADENOSINE THALLIUM  1997   apical and inferior segments with questionable infarct and ischemia in anteroseptal & inferoseptal segments (Dr. Standley Brooking. Shepherd)  . SVD     x 7  . TOTAL HIP ARTHROPLASTY Right 07/24/2016  . TOTAL HIP ARTHROPLASTY Right 07/24/2016   Procedure: RIGHT TOTAL HIP ARTHROPLASTY ANTERIOR APPROACH;  Surgeon: Jodi GeraldsJohn Graves, MD;  Location: [facility] OR;  Service: Orthopedics;  Laterality: Right;  . TOTAL HIP ARTHROPLASTY Left 04/09/2017   Procedure: LEFT TOTAL HIP ARTHROPLASTY ANTERIOR APPROACH;  Surgeon: Jodi GeraldsGraves, John, MD;  Location: [facility] ORS;  Service: Orthopedics;  Laterality: Left;  . TOTAL SHOULDER ARTHROPLASTY  01/18/2012   Procedure: TOTAL SHOULDER ARTHROPLASTY;  Surgeon: Mable ParisJustin William Chandler, MD;  [facility];  Service: Orthopedics;  Laterality: Left;  . TOTAL SHOULDER REPLACEMENT Left 01/19/2012  . TRANSTHORACIC ECHOCARDIOGRAM  06/2011   LV mildly dilated, EF=>55%; mild mitral annular calcif, mild MR; mild TR; AV mildly  sclerotic, mild aortic regurg  . TUBAL LIGATION    . WISDOM TOOTH EXTRACTION       OB History   No obstetric history on file.      Home Medications    Prior to Admission medications   Medication Sig Start Date End Date Taking? Authorizing Provider  aspirin 325 MG EC tablet Take 1 tablet (325 mg total) by mouth daily. Patient taking differently: Take 650 mg by mouth every 6 (six) hours as needed for pain.  06/17/18  Yes Isaiah Serge, NP  aspirin 81 MG chewable tablet Chew 81 mg by mouth daily.   Yes [provider]  docusate sodium (COLACE) 100 MG capsule Take 1 capsule (100 mg total) by mouth 2 (two) times daily. Patient taking differently: Take 100 mg by mouth daily.  07/24/16  Yes Gary Fleet, PA-C  Ferrous Sulfate (IRON) 325 (65 Fe) MG TABS Take 325 mg by mouth daily.    Yes [provider]  hydrOXYzine (ATARAX/VISTARIL) 25 MG tablet TAKE 1 TABLET BY MOUTH THREE TIMES A DAY AS NEEDED Patient taking differently: Take 25 mg by mouth 3 (three) times daily as needed for anxiety.  11/06/18  Yes Stallings, Zoe A, MD  lisinopril (PRINIVIL,ZESTRIL) 20 MG tablet Take 1 tablet (20 mg total) by mouth daily. 01/08/18  Yes Stallings, Zoe A, MD  rosuvastatin (CRESTOR) 40 MG tablet Take 1 tablet (40 mg total) by mouth daily. 09/30/18  Yes Stallings, Zoe A, MD  trimethoprim (TRIMPEX) 100 MG tablet Take 100 mg by mouth daily.   Yes [provider]  polyethylene glycol powder (GLYCOLAX/MIRALAX) 17 GM/SCOOP powder Mix 1 capful of powder in drink and take by mouth one to three times daily as needed for daily soft stools  OTC 12/13/18   Avelynn Sellin, Wenda Overland, MD    Family History Family History  Problem Relation Age of Onset  . Heart disease Mother   . Heart disease Father   . Heart disease Sister   . Hypertension Sister   . Heart disease Sister   . Hypertension Sister   . Heart disease Sister   . Hypertension Sister     Social History Social History   Tobacco Use  . Smoking status: Former Smoker    Packs/day: 1.00    Years: 25.00    Pack years: 25.00    Types: Cigarettes    Quit date: 03/07/1984    Years since quitting: 34.7  . Smokeless tobacco: Former Network engineer Use Topics  . Alcohol use: No  . Drug use: No     Allergies   Effexor [venlafaxine hcl] and Sulfa drugs cross reactors   Review of Systems Review of Systems  Gastrointestinal: Positive for hematochezia.   All other systems reviewed and are negative except that which was mentioned in HPI   Physical Exam  Updated Vital Signs BP (!) 121/100   Pulse 69   Temp 98.3 F (36.8 C) (Oral)   Resp 18   SpO2 100%   Physical Exam Vitals signs and nursing note reviewed. Exam conducted with a chaperone present.  Constitutional:      General: She is not in acute distress.    Appearance: She is well-developed.  HENT:     Head: Normocephalic and atraumatic.  Eyes:     Conjunctiva/sclera: Conjunctivae normal.  Neck:     Musculoskeletal: Neck supple.  Cardiovascular:     Rate and Rhythm: Normal rate.  Pulmonary:     Effort: Pulmonary  effort is normal.  Abdominal:     General: There is no distension.     Palpations: Abdomen is soft.     Tenderness: There is no abdominal tenderness.  Genitourinary:    Comments: Large prolapse of vaginal wall through vagina with irritation of mucosal surface and scant blood; no external hemorrhoids or perianal bleeding, no gross blood or melena on DRE Musculoskeletal:     Comments: Trace BLE edema  Skin:    General: Skin is warm and dry.  Neurological:     Mental Status: She is alert and oriented to person, place, and time.     Comments: Fluent speech  Psychiatric:        Judgment: Judgment normal.      ED Treatments / Results  Labs (all labs ordered are listed, but only abnormal results are displayed) Labs Reviewed  COMPREHENSIVE METABOLIC PANEL - Abnormal; Notable for the following components:      Result Value   Calcium 11.1 (*)    All other components within normal limits  CBC - Abnormal; Notable for the following components:   Hemoglobin 11.7 (*)    All other components within normal limits  TYPE AND SCREEN    EKG None  Radiology No results found.  Procedures Procedures (including critical care time)  Medications Ordered in ED Medications - No data to display   Initial Impression / Assessment and Plan / ED Course  I have reviewed the triage vital signs and the nursing notes.  Pertinent labs that were available during my care of  the patient were reviewed by me and considered in my medical decision making (see chart for details).        PT had very large prolapse through vagina w/ irritation of mucosa which appeared to be source of BRB. Easily reducible, no active bleeding. No bleeding from rectum.  Hemoglobin today is 11.7 which is stable from previous.  I reviewed chart and see she follows w/ Dr. Sherron Monday for cystocele. Recommended she contact clinic for appt. I have discussed supportive measures and recommended MiraLAX to ensure soft bowel movements to minimize straining.  Return precautions reviewed.  Final Clinical Impressions(s) / ED Diagnoses   Final diagnoses:  Cystocele with prolapse    ED Discharge Orders         Ordered    polyethylene glycol powder (GLYCOLAX/MIRALAX) 17 GM/SCOOP powder     12/13/18 1121           Taris Galindo, Ambrose Finland, MD 12/13/18 1203

## 2018-12-13 NOTE — Progress Notes (Signed)
CSW received call from [place] stating that patient's daughter could not come get patient until 3p or 3:30p today due to her having to work and then attend a scheduled doctor's appointment. CSW spoke with patient who reports she lives in [place], which is too far for her to be given a taxi voucher. CSW contacted patient's daughter who reports she is willing to come get patient once she finishes her appointment and confirmed it will be around 3:30p at the latest. [place] informed charge RN. No other social work needs reported, please reconsult for further needs. CSW signing off.   [place], LCSW Transitions of Care Department [facility] ED [phone]

## 2018-12-13 NOTE — ED Triage Notes (Signed)
Per [facility] pt from home for constipation and bright red bleeding today when strained to have a BM.  Vitals: 140/92, HR 78, 16R, 98% on RA  temp 97.9

## 2018-12-13 NOTE — Discharge Instructions (Signed)
The bleeding you are having appears to be coming from your cystocele--bladder wall prolapsing through vagina. Take miralax so that you have soft bowel movements and don't have to strain. Return to the ER if you have any heavy bleeding.

## 2018-12-13 NOTE — ED Notes (Signed)
Contacted patient's daughter [name] t o pick patient up at patient's request. Daughter stated she was at work, not coming to get her mother, and that the patient would be alright until she got off at 1:30 and then completed her MD appt at 2pm. Daughter stated to leave patient in the waiting room and she would be here later to get her. Social Work contacted and notified of transportation issue. Social Work to work on alternate [employer] of transportation.

## 2018-12-15 DIAGNOSIS — N8111 Cystocele, midline: Secondary | ICD-10-CM | POA: Diagnosis not present

## 2018-12-15 DIAGNOSIS — N3946 Mixed incontinence: Secondary | ICD-10-CM | POA: Diagnosis not present

## 2018-12-23 DIAGNOSIS — R338 Other retention of urine: Secondary | ICD-10-CM | POA: Diagnosis not present

## 2018-12-23 DIAGNOSIS — K5900 Constipation, unspecified: Secondary | ICD-10-CM | POA: Diagnosis not present

## 2018-12-23 DIAGNOSIS — N3020 Other chronic cystitis without hematuria: Secondary | ICD-10-CM | POA: Diagnosis not present

## 2018-12-23 DIAGNOSIS — N8111 Cystocele, midline: Secondary | ICD-10-CM | POA: Diagnosis not present

## 2018-12-23 DIAGNOSIS — N139 Obstructive and reflux uropathy, unspecified: Secondary | ICD-10-CM | POA: Diagnosis not present

## 2018-12-23 DIAGNOSIS — R3912 Poor urinary stream: Secondary | ICD-10-CM | POA: Diagnosis not present

## 2018-12-27 ENCOUNTER — Ambulatory Visit (INDEPENDENT_AMBULATORY_CARE_PROVIDER_SITE_OTHER): Payer: MEDICARE | Admitting: Psychology

## 2018-12-27 DIAGNOSIS — F4323 Adjustment disorder with mixed anxiety and depressed mood: Secondary | ICD-10-CM | POA: Diagnosis not present

## 2019-01-01 DIAGNOSIS — R338 Other retention of urine: Secondary | ICD-10-CM | POA: Diagnosis not present

## 2019-01-05 DIAGNOSIS — N3020 Other chronic cystitis without hematuria: Secondary | ICD-10-CM | POA: Diagnosis not present

## 2019-01-05 DIAGNOSIS — N8111 Cystocele, midline: Secondary | ICD-10-CM | POA: Diagnosis not present

## 2019-01-10 ENCOUNTER — Other Ambulatory Visit: Payer: Self-pay | Admitting: Family Medicine

## 2019-01-10 NOTE — Telephone Encounter (Signed)
Forwarding medication refill request to the clinical pool for review. 

## 2019-01-10 NOTE — Telephone Encounter (Signed)
Need an office visit before any further refills. Courtesy refill has been sent

## 2019-01-11 NOTE — Telephone Encounter (Signed)
Spoke with pt and daughter. Scheduled appt for med refills

## 2019-01-24 DIAGNOSIS — N3020 Other chronic cystitis without hematuria: Secondary | ICD-10-CM | POA: Diagnosis not present

## 2019-01-24 DIAGNOSIS — N139 Obstructive and reflux uropathy, unspecified: Secondary | ICD-10-CM | POA: Diagnosis not present

## 2019-01-24 DIAGNOSIS — N8111 Cystocele, midline: Secondary | ICD-10-CM | POA: Diagnosis not present

## 2019-01-30 ENCOUNTER — Other Ambulatory Visit: Payer: Self-pay | Admitting: Family Medicine

## 2019-01-31 ENCOUNTER — Ambulatory Visit (INDEPENDENT_AMBULATORY_CARE_PROVIDER_SITE_OTHER): Payer: MEDICARE | Admitting: Psychology

## 2019-01-31 DIAGNOSIS — F4323 Adjustment disorder with mixed anxiety and depressed mood: Secondary | ICD-10-CM

## 2019-02-06 ENCOUNTER — Ambulatory Visit: Payer: MEDICARE | Admitting: Family Medicine

## 2019-02-06 NOTE — Progress Notes (Deleted)
Established Patient Office Visit  Subjective:  Patient ID: [name], female    DOB: [birth year]  Age: 78 y.o. MRN: [id number]  CC: No chief complaint on file.   HPI [name] presents for ***   Anemia Anemia: Patient presents for presents evaluation of anemia. Anemia was found by {Found:12347}.  It has been present for {Numbers; 0-10:33138} {Time Units:11}.  Associated signs & symptoms: {Symptoms:12350}. Lab Results  Component Value Date   WBC 5.7 12/13/2018   HGB 11.7 (L) 12/13/2018   HCT 37.6 12/13/2018   MCV 96.2 12/13/2018   PLT 232 12/13/2018    Cystocele She was in the ER for a large cystocele She has an appt with Dr. Jeronimo Greaves at [facility] She currently has  Rectal Bleeding Seen in the ED 12/13/2018 Advised to use fiber and avoid constipation  Colon cancer screenings?  Hypertension: Patient here for follow-up of elevated blood pressure. She {is/is not:9024} exercising and {is/is not:9024} adherent to low salt diet.  Blood pressure {is/is not:9024} well controlled at home. Cardiac symptoms {Symptoms; cardiac:12860}. Patient denies {Symptoms; cardiac:12860}.  Cardiovascular risk factors: {cv risk factors:510}. Use of agents associated with hypertension: {bp agents assoc with hypertension:511::"none"}. History of target organ damage: {target organ:[phone]}. BP Readings from Last 3 Encounters:  12/13/18 (!) 121/100  09/30/18 117/79  06/17/18 131/84   Lab Results  Component Value Date   CREATININE 0.85 12/13/2018    Past Medical History:  Diagnosis Date  . Anemia    on iron  . Anxiety   . Arthritis    shoulders, knees, hands, hips; status post left knee surgery  . Chest pain at rest 06/16/2018  . Chronic bronchitis (HCC)    CXR 07/08/16  . Depression   . GERD (gastroesophageal reflux disease)    TAKES TUMS  . H/O: pneumonia 2003  . Hyperlipidemia    on med  . Hypertension   . Morbid obesity with BMI of 45.0-49.9, adult (HCC)   . Shingles     recent 02/2017  . Thoracic aortic atherosclerosis (HCC)    CXR 07/08/16    Past Surgical History:  Procedure Laterality Date  . CARDIAC CATHETERIZATION  04/20/1996   patent coronaries, EF 74% (Dr. Aram Candela)  . CARPAL TUNNEL RELEASE Left   . CATARACT EXTRACTION, BILATERAL    . COLONOSCOPY    . KNEE SURGERY Left 11/2001  . REPLACEMENT TOTAL KNEE Left 10/2009  . REPLACEMENT TOTAL KNEE Left 10/2009  . SHOULDER OPEN ROTATOR CUFF REPAIR  03/01/2012   Procedure: ROTATOR CUFF REPAIR SHOULDER OPEN;  Surgeon: Mable Paris, MD;  Location: [facility] OR;  Service: Orthopedics;  Laterality: Left;  SUBSCAPULARIS REPAIR  . STRESS TEST - ADENOSINE THALLIUM  1997   apical and inferior segments with questionable infarct and ischemia in anteroseptal & inferoseptal segments (Dr. Standley Brooking)  . SVD     x 7  . TOTAL HIP ARTHROPLASTY Right 07/24/2016  . TOTAL HIP ARTHROPLASTY Right 07/24/2016   Procedure: RIGHT TOTAL HIP ARTHROPLASTY ANTERIOR APPROACH;  Surgeon: [name], MD;  Location: [facility] OR;  Service: Orthopedics;  Laterality: Right;  . TOTAL HIP ARTHROPLASTY Left 04/09/2017   Procedure: LEFT TOTAL HIP ARTHROPLASTY ANTERIOR APPROACH;  Surgeon: [name], MD;  Location: [facility] ORS;  Service: Orthopedics;  Laterality: Left;  . TOTAL SHOULDER ARTHROPLASTY  01/18/2012   Procedure: TOTAL SHOULDER ARTHROPLASTY;  Surgeon: Mable Paris, MD;  Location: [facility] OR;  Service: Orthopedics;  Laterality: Left;  . TOTAL SHOULDER REPLACEMENT Left  01/19/2012  . TRANSTHORACIC ECHOCARDIOGRAM  06/2011   LV mildly dilated, EF=>55%; mild mitral annular calcif, mild MR; mild TR; AV mildly sclerotic, mild aortic regurg  . TUBAL LIGATION    . WISDOM TOOTH EXTRACTION      Family History  Problem Relation Age of Onset  . Heart disease Mother   . Heart disease Father   . Heart disease Sister   . Hypertension Sister   . Heart disease Sister   . Hypertension Sister   . Heart disease Sister   . Hypertension Sister      Social History   Socioeconomic History  . Marital status: Widowed    Spouse name: Not on file  . Number of children: 7  . Years of education: Not on file  . Highest education level: Not on file  Occupational History  . Occupation: retired    Fish farm manager: RETIRED  Social Needs  . Financial resource strain: Not on file  . Food insecurity    Worry: Not on file    Inability: Not on file  . Transportation needs    Medical: Not on file    Non-medical: Not on file  Tobacco Use  . Smoking status: Former Smoker    Packs/day: 1.00    Years: 25.00    Pack years: 25.00    Types: Cigarettes    Quit date: 03/07/1984    Years since quitting: 34.9  . Smokeless tobacco: Former Network engineer and Sexual Activity  . Alcohol use: No  . Drug use: No  . Sexual activity: Not Currently    Birth control/protection: Surgical  Lifestyle  . Physical activity    Days per week: Not on file    Minutes per session: Not on file  . Stress: Not on file  Relationships  . Social Herbalist on phone: Not on file    Gets together: Not on file    Attends religious service: Not on file    Active member of club or organization: Not on file    Attends meetings of clubs or organizations: Not on file    Relationship status: Not on file  . Intimate partner violence    Fear of current or ex partner: Not on file    Emotionally abused: Not on file    Physically abused: Not on file    Forced sexual activity: Not on file  Other Topics Concern  . Not on file  Social History Narrative   Marital status:   She is a widowed since 2009; married 73 years      Children:   mother of 35, grandmother of 38.      Lives: alone with home; daughter, son lives with patient      Tobacco: quit smoking 34 years ago      Alcohol: none      ADLs:  Walks with cane; drives.     She does not drink alcohol or smoke cigarettes.    Outpatient Medications Prior to Visit  Medication Sig Dispense Refill  . aspirin 325 MG  EC tablet Take 1 tablet (325 mg total) by mouth daily. (Patient taking differently: Take 650 mg by mouth every 6 (six) hours as needed for pain. )    . aspirin 81 MG chewable tablet Chew 81 mg by mouth daily.    Marland Kitchen docusate sodium (COLACE) 100 MG capsule Take 1 capsule (100 mg total) by mouth 2 (two) times daily. (Patient taking differently: Take 100 mg by  mouth daily. ) 30 capsule 0  . Ferrous Sulfate (IRON) 325 (65 Fe) MG TABS Take 325 mg by mouth daily.     . hydrOXYzine (ATARAX/VISTARIL) 25 MG tablet TAKE 1 TABLET BY MOUTH THREE TIMES A DAY AS NEEDED (Patient taking differently: Take 25 mg by mouth 3 (three) times daily as needed for anxiety. ) 90 tablet 3  . lisinopril (ZESTRIL) 20 MG tablet TAKE 1 TABLET BY MOUTH EVERY DAY 30 tablet 0  . polyethylene glycol powder (GLYCOLAX/MIRALAX) 17 GM/SCOOP powder Mix 1 capful of powder in drink and take by mouth one to three times daily as needed for daily soft stools  OTC 225 g 0  . rosuvastatin (CRESTOR) 40 MG tablet Take 1 tablet (40 mg total) by mouth daily. 90 tablet 3  . trimethoprim (TRIMPEX) 100 MG tablet Take 100 mg by mouth daily.     No facility-administered medications prior to visit.     Allergies  Allergen Reactions  . Effexor [Venlafaxine Hcl] Swelling and Other (See Comments)    MD ADVISES PATIENT TO NOT TAKE IN FUTURE PT STOPPED, MD DC'd MED  . Sulfa Drugs Cross Reactors Itching    ROS Review of Systems    Objective:    Physical Exam  There were no vitals taken for this visit. Wt Readings from Last 3 Encounters:  09/30/18 192 lb 9.6 oz (87.4 kg)  06/17/18 194 lb 3.2 oz (88.1 kg)  03/30/18 199 lb 12.8 oz (90.6 kg)     Health Maintenance Due  Topic Date Due  . DEXA SCAN  09/03/2007  . INFLUENZA VACCINE  11/12/2018    There are no preventive care reminders to display for this patient.  Lab Results  Component Value Date   TSH 0.85 06/13/2015   Lab Results  Component Value Date   WBC 5.7 12/13/2018   HGB  11.7 (L) 12/13/2018   HCT 37.6 12/13/2018   MCV 96.2 12/13/2018   PLT 232 12/13/2018   Lab Results  Component Value Date   NA 141 12/13/2018   K 4.2 12/13/2018   CO2 28 12/13/2018   GLUCOSE 98 12/13/2018   BUN 17 12/13/2018   CREATININE 0.85 12/13/2018   BILITOT 0.4 12/13/2018   ALKPHOS 53 12/13/2018   AST 21 12/13/2018   ALT 11 12/13/2018   PROT 7.3 12/13/2018   ALBUMIN 4.0 12/13/2018   CALCIUM 11.1 (H) 12/13/2018   ANIONGAP 11 12/13/2018   Lab Results  Component Value Date   CHOL 181 02/21/2018   Lab Results  Component Value Date   HDL 87 02/21/2018   Lab Results  Component Value Date   LDLCALC 84 02/21/2018   Lab Results  Component Value Date   TRIG 49 02/21/2018   Lab Results  Component Value Date   CHOLHDL 2.1 02/21/2018   Lab Results  Component Value Date   HGBA1C 5.8 05/01/2012      Assessment & Plan:   Problem List Items Addressed This Visit    None      No orders of the defined types were placed in this encounter.   Follow-up: No follow-ups on file.    Doristine BosworthZoe A Xiomara Sevillano, MD

## 2019-02-21 ENCOUNTER — Other Ambulatory Visit: Payer: Self-pay

## 2019-02-21 ENCOUNTER — Ambulatory Visit (INDEPENDENT_AMBULATORY_CARE_PROVIDER_SITE_OTHER): Payer: MEDICARE | Admitting: Registered Nurse

## 2019-02-21 DIAGNOSIS — Z23 Encounter for immunization: Secondary | ICD-10-CM | POA: Diagnosis not present

## 2019-02-22 ENCOUNTER — Encounter: Payer: Self-pay | Admitting: Family Medicine

## 2019-02-22 ENCOUNTER — Telehealth (INDEPENDENT_AMBULATORY_CARE_PROVIDER_SITE_OTHER): Payer: MEDICARE | Admitting: Family Medicine

## 2019-02-22 DIAGNOSIS — I10 Essential (primary) hypertension: Secondary | ICD-10-CM

## 2019-02-22 DIAGNOSIS — N8189 Other female genital prolapse: Secondary | ICD-10-CM

## 2019-02-22 MED ORDER — LISINOPRIL 20 MG PO TABS: 20.0000 mg | ORAL_TABLET | Freq: Every day | ORAL | 3 refills | Status: DC

## 2019-02-22 NOTE — Patient Instructions (Signed)
° ° ° °  If you have lab work done today you will be contacted with your lab results within the next 2 weeks.  If you have not heard from us then please contact us. The fastest way to get your results is to register for My Chart. ° ° °IF you received an x-ray today, you will receive an invoice from [facility]. Please contact  Radiology at [phone] with questions or concerns regarding your invoice.  ° °IF you received labwork today, you will receive an invoice from [facility]. Please contact [facility] at [phone] with questions or concerns regarding your invoice.  ° °Our billing staff will not be able to assist you with questions regarding bills from these companies. ° °You will be contacted with the lab results as soon as they are available. The fastest way to get your results is to activate your My Chart account. Instructions are located on the last page of this paperwork. If you have not heard from us regarding the results in 2 weeks, please contact this office. °  ° ° ° °

## 2019-02-22 NOTE — Progress Notes (Signed)
Telemedicine Encounter- SOAP NOTE Established Patient  This telephone encounter was conducted with the patient's (or proxy's) verbal consent via audio telecommunications: yes/no: Yes Patient was instructed to have this encounter in a suitably private space; and to only have persons present to whom they give permission to participate. In addition, patient identity was confirmed by use of name plus two identifiers (DOB and address).  I discussed the limitations, risks, security and privacy concerns of performing an evaluation and management service by telephone and the availability of in person appointments. I also discussed with the patient that there may be a patient responsible charge related to this service. The patient expressed understanding and agreed to proceed.  I spent a total of TIME; 0 MIN TO 60 MIN: 20 minutes talking with the patient or their proxy.  No chief complaint on file.   Subjective   [name] is a 78 y.o. established patient. Telephone visit today for  HPI  Spoke to patient's daughter [name]  She states that she going to counseling for her moods  Depression screen [facility] 2/9 02/22/2019 09/30/2018 01/08/2018 08/11/2017 01/20/2017  Decreased Interest 0 0 1 0 1  Down, Depressed, Hopeless 1 0 2 0 1  PHQ - 2 Score 1 0 3 0 2  Altered sleeping - - 2 - 0  Tired, decreased energy - - 3 - 1  Change in appetite - - 0 - 0  Feeling bad or failure about yourself  - - 0 - 0  Trouble concentrating - - 1 - 0  Moving slowly or fidgety/restless - - 2 - 0  Suicidal thoughts - - 0 - 0  PHQ-9 Score - - 11 - 3  Difficult doing work/chores - - Very difficult - -   Pelvic Organ Prolapse She has pelvic organ prolapse and will see Gynceology She is wearing a pessary but states that it fell out later that night She bleeds with catheterization  She denies fevers or chills  Hypertension  She needs blood pressure med refills. She denies any chest pains or palpitations She  states that she is taking her bp medications daily. BP Readings from Last 3 Encounters:  12/13/18 (!) 121/100  09/30/18 117/79  06/17/18 131/84     Patient Active Problem List   Diagnosis Date Noted  . Chest pain, negative stress test. non cardiac. 06/16/2018  . Primary osteoarthritis of left hip 01/20/2017  . Primary osteoarthritis of right hip 11/10/2015  . Anxiety state 11/10/2015  . Preop cardiovascular exam 07/23/2014  . Bilateral edema of lower extremity 07/18/2013    Class: Chronic  . Metabolic syndrome 07/18/2013  . Essential hypertension 07/07/2012  . GERD (gastroesophageal reflux disease) 07/07/2012  . Hyperlipidemia with target LDL less than 100 07/07/2012  . Rotator cuff tear, left 03/02/2012  . Osteoarthritis of left shoulder 01/21/2012    Past Medical History:  Diagnosis Date  . Anemia    on iron  . Anxiety   . Arthritis    shoulders, knees, hands, hips; status post left knee surgery  . Chest pain at rest 06/16/2018  . Chronic bronchitis (HCC)    CXR 07/08/16  . Depression   . GERD (gastroesophageal reflux disease)    TAKES TUMS  . H/O: pneumonia 2003  . Hyperlipidemia    on med  . Hypertension   . Morbid obesity with BMI of 45.0-49.9, adult (HCC)   . Shingles    recent 02/2017  . Thoracic aortic atherosclerosis (HCC)  CXR 07/08/16    Current Outpatient Medications  Medication Sig Dispense Refill  . aspirin 81 MG chewable tablet Chew 81 mg by mouth daily.    Marland Kitchen docusate sodium (COLACE) 100 MG capsule Take 1 capsule (100 mg total) by mouth 2 (two) times daily. (Patient taking differently: Take 100 mg by mouth daily. ) 30 capsule 0  . Ferrous Sulfate (IRON) 325 (65 Fe) MG TABS Take 325 mg by mouth daily.     . hydrOXYzine (ATARAX/VISTARIL) 25 MG tablet TAKE 1 TABLET BY MOUTH THREE TIMES A DAY AS NEEDED (Patient taking differently: Take 25 mg by mouth 3 (three) times daily as needed for anxiety. ) 90 tablet 3  . lisinopril (ZESTRIL) 20 MG tablet Take 1  tablet (20 mg total) by mouth daily. 90 tablet 3  . polyethylene glycol powder (GLYCOLAX/MIRALAX) 17 GM/SCOOP powder Mix 1 capful of powder in drink and take by mouth one to three times daily as needed for daily soft stools  OTC 225 g 0  . rosuvastatin (CRESTOR) 40 MG tablet Take 1 tablet (40 mg total) by mouth daily. 90 tablet 3  . trimethoprim (TRIMPEX) 100 MG tablet Take 100 mg by mouth daily.    Marland Kitchen aspirin 325 MG EC tablet Take 1 tablet (325 mg total) by mouth daily. (Patient not taking: Reported on 02/22/2019)     No current facility-administered medications for this visit.     Allergies  Allergen Reactions  . Effexor [Venlafaxine Hcl] Swelling and Other (See Comments)    MD ADVISES PATIENT TO NOT TAKE IN FUTURE PT STOPPED, MD DC'd MED  . Sulfa Drugs Cross Reactors Itching    Social History   Socioeconomic History  . Marital status: Widowed    Spouse name: Not on file  . Number of children: 7  . Years of education: Not on file  . Highest education level: Not on file  Occupational History  . Occupation: retired    Associate Professor: RETIRED  Social Needs  . Financial resource strain: Not on file  . Food insecurity    Worry: Not on file    Inability: Not on file  . Transportation needs    Medical: Not on file    Non-medical: Not on file  Tobacco Use  . Smoking status: Former Smoker    Packs/day: 1.00    Years: 25.00    Pack years: 25.00    Types: Cigarettes    Quit date: 03/07/1984    Years since quitting: 34.9  . Smokeless tobacco: Former Engineer, water and Sexual Activity  . Alcohol use: No  . Drug use: No  . Sexual activity: Not Currently    Birth control/protection: Surgical  Lifestyle  . Physical activity    Days per week: Not on file    Minutes per session: Not on file  . Stress: Not on file  Relationships  . Social Musician on phone: Not on file    Gets together: Not on file    Attends religious service: Not on file    Active member of club  or organization: Not on file    Attends meetings of clubs or organizations: Not on file    Relationship status: Not on file  . Intimate partner violence    Fear of current or ex partner: Not on file    Emotionally abused: Not on file    Physically abused: Not on file    Forced sexual activity: Not on file  Other  Topics Concern  . Not on file  Social History Narrative   Marital status:   She is a widowed since 2009; married 87 years      Children:   mother of 72, grandmother of 17.      Lives: alone with home; daughter, son lives with patient      Tobacco: quit smoking 34 years ago      Alcohol: none      ADLs:  Walks with cane; drives.     She does not drink alcohol or smoke cigarettes.    ROS  Objective   Vitals as reported by the patient: There were no vitals filed for this visit.  Diagnoses and all orders for this visit:  Essential hypertension  Other female genital prolapse  Other orders -     lisinopril (ZESTRIL) 20 MG tablet; Take 1 tablet (20 mg total) by mouth daily.    Refilled blood pressure medication lisinopril   Pt to follow up with Uro/Gynecology for pessary placement   I discussed the assessment and treatment plan with the patient. The patient was provided an opportunity to ask questions and all were answered. The patient agreed with the plan and demonstrated an understanding of the instructions.   The patient was advised to call back or seek an in-person evaluation if the symptoms worsen or if the condition fails to improve as anticipated.  I provided 20 minutes of non-face-to-face time during this encounter.  Forrest Moron, MD  Primary Care at [facility]

## 2019-02-22 NOTE — Progress Notes (Signed)
Spoke to pt and daughter Dajanae Brophy)  Sees physic via Telemed- sees behavior health for the last 6 months (Dr. Maretta Bees re[address])  Med Refills- Lisinopril

## 2019-04-21 ENCOUNTER — Emergency Department (HOSPITAL_COMMUNITY)
Admission: EM | Admit: 2019-04-21 | Discharge: 2019-04-21 | Disposition: A | Discharge status: Home / Self Care | Payer: MEDICARE | Attending: Emergency Medicine | Admitting: Emergency Medicine

## 2019-04-21 ENCOUNTER — Ambulatory Visit: Payer: MEDICARE | Admitting: Cardiology

## 2019-04-21 DIAGNOSIS — N8110 Cystocele, unspecified: Secondary | ICD-10-CM | POA: Insufficient documentation

## 2019-04-21 DIAGNOSIS — N3941 Urge incontinence: Secondary | ICD-10-CM | POA: Insufficient documentation

## 2019-04-21 DIAGNOSIS — R32 Unspecified urinary incontinence: Secondary | ICD-10-CM | POA: Diagnosis not present

## 2019-04-21 DIAGNOSIS — Z96612 Presence of left artificial shoulder joint: Secondary | ICD-10-CM | POA: Diagnosis not present

## 2019-04-21 DIAGNOSIS — Z96642 Presence of left artificial hip joint: Secondary | ICD-10-CM | POA: Diagnosis not present

## 2019-04-21 DIAGNOSIS — Z79899 Other long term (current) drug therapy: Secondary | ICD-10-CM | POA: Diagnosis not present

## 2019-04-21 DIAGNOSIS — Z96641 Presence of right artificial hip joint: Secondary | ICD-10-CM | POA: Diagnosis not present

## 2019-04-21 DIAGNOSIS — I959 Hypotension, unspecified: Secondary | ICD-10-CM | POA: Diagnosis not present

## 2019-04-21 DIAGNOSIS — Z87891 Personal history of nicotine dependence: Secondary | ICD-10-CM | POA: Diagnosis not present

## 2019-04-21 DIAGNOSIS — R52 Pain, unspecified: Secondary | ICD-10-CM | POA: Diagnosis not present

## 2019-04-21 DIAGNOSIS — Z96652 Presence of left artificial knee joint: Secondary | ICD-10-CM | POA: Diagnosis not present

## 2019-04-21 DIAGNOSIS — I10 Essential (primary) hypertension: Secondary | ICD-10-CM | POA: Diagnosis not present

## 2019-04-21 DIAGNOSIS — Z7982 Long term (current) use of aspirin: Secondary | ICD-10-CM | POA: Insufficient documentation

## 2019-04-21 DIAGNOSIS — R58 Hemorrhage, not elsewhere classified: Secondary | ICD-10-CM | POA: Diagnosis not present

## 2019-04-21 NOTE — ED Provider Notes (Signed)
[facility] EMERGENCY DEPARTMENT Provider Note   CSN: [id number] Arrival date & time: 04/21/19  2029     History No chief complaint on file.   [name] is a 79 y.o. female presenting for bladder prolapse.  Patient states she has had issues with bladder prolapse for years.  She is also had issues with urinary incontinence for years.  Today her daughter was helping her bathe until for the first time, thus prompting an ER visit.  Patient states she has had more pain in the past several days, but otherwise her symptoms are not new.  She follows with Dr. Celso Amy, and is supposed to get a bladder tack at some point but she does not know when this is.  She denies fevers, chills, flank pain, nausea, vomiting, abdominal pain.  Patient states she occasionally has issues with her bowels, occasionally takes medicine for this.  Additional history obtained from chart review.  Patient with a history of anemia, anxiety, chronic bronchitis, GERD, hyperlipidemia, hypertension, cystocele, urinary retention.  She is followed by Dr. Idell Pickles with urology and has a scheduled appointment with Dr. Ashley Royalty with urological gynecology on 01/20.   Additional history obtained from patient's daughter.  She states she was helping her mom a for the first time today, and saw the prolapse of the first time.  As such, she called EMS.  She and her brother were concerned that this was something life-threatening or concerning, but are aware that patient is following up and has scheduled appointments in the next 4 weeks.  HPI     Past Medical History:  Diagnosis Date  . Anemia    on iron  . Anxiety   . Arthritis    shoulders, knees, hands, hips; status post left knee surgery  . Chest pain at rest 06/16/2018  . Chronic bronchitis (HCC)    CXR 07/08/16  . Depression   . GERD (gastroesophageal reflux disease)    TAKES TUMS  . H/O: pneumonia 2003  . Hyperlipidemia    on med  . Hypertension   .  Morbid obesity with BMI of 45.0-49.9, adult (HCC)   . Shingles    recent 02/2017  . Thoracic aortic atherosclerosis (HCC)    CXR 07/08/16    Patient Active Problem List   Diagnosis Date Noted  . Chest pain, negative stress test. non cardiac. 06/16/2018  . Primary osteoarthritis of left hip 01/20/2017  . Primary osteoarthritis of right hip 11/10/2015  . Anxiety state 11/10/2015  . Preop cardiovascular exam 07/23/2014  . Bilateral edema of lower extremity 07/18/2013    Class: Chronic  . Metabolic syndrome 07/18/2013  . Essential hypertension 07/07/2012  . GERD (gastroesophageal reflux disease) 07/07/2012  . Hyperlipidemia with target LDL less than 100 07/07/2012  . Rotator cuff tear, left 03/02/2012  . Osteoarthritis of left shoulder 01/21/2012    Past Surgical History:  Procedure Laterality Date  . CARDIAC CATHETERIZATION  04/20/1996   patent coronaries, EF 74% (Dr. Aram Candela)  . CARPAL TUNNEL RELEASE Left   . CATARACT EXTRACTION, BILATERAL    . COLONOSCOPY    . KNEE SURGERY Left 11/2001  . REPLACEMENT TOTAL KNEE Left 10/2009  . REPLACEMENT TOTAL KNEE Left 10/2009  . SHOULDER OPEN ROTATOR CUFF REPAIR  03/01/2012   Procedure: ROTATOR CUFF REPAIR SHOULDER OPEN;  Surgeon: Mable Paris, MD;  Location: [facility] OR;  Service: Orthopedics;  Laterality: Left;  SUBSCAPULARIS REPAIR  . STRESS TEST - ADENOSINE THALLIUM  1997  apical and inferior segments with questionable infarct and ischemia in anteroseptal & inferoseptal segments (Dr. Aileen Fass)  . SVD     x 7  . TOTAL HIP ARTHROPLASTY Right 07/24/2016  . TOTAL HIP ARTHROPLASTY Right 07/24/2016   Procedure: RIGHT TOTAL HIP ARTHROPLASTY ANTERIOR APPROACH;  Surgeon: Dorna Leitz, MD;  Location: [place];  Service: Orthopedics;  Laterality: Right;  . TOTAL HIP ARTHROPLASTY Left 04/09/2017   Procedure: LEFT TOTAL HIP ARTHROPLASTY ANTERIOR APPROACH;  Surgeon: Dorna Leitz, MD;  Location: [facility];  Service: Orthopedics;  Laterality: Left;  .  TOTAL SHOULDER ARTHROPLASTY  01/18/2012   Procedure: TOTAL SHOULDER ARTHROPLASTY;  Surgeon: Nita Sells, MD;  Location: [place];  Service: Orthopedics;  Laterality: Left;  . TOTAL SHOULDER REPLACEMENT Left 01/19/2012  . TRANSTHORACIC ECHOCARDIOGRAM  06/2011   LV mildly dilated, EF=>55%; mild mitral annular calcif, mild MR; mild TR; AV mildly sclerotic, mild aortic regurg  . TUBAL LIGATION    . WISDOM TOOTH EXTRACTION       OB History   No obstetric history on file.     Family History  Problem Relation Age of Onset  . Heart disease Mother   . Heart disease Father   . Heart disease Sister   . Hypertension Sister   . Heart disease Sister   . Hypertension Sister   . Heart disease Sister   . Hypertension Sister     Social History   Tobacco Use  . Smoking status: Former Smoker    Packs/day: 1.00    Years: 25.00    Pack years: 25.00    Types: Cigarettes    Quit date: 03/07/1984    Years since quitting: 35.1  . Smokeless tobacco: Former Network engineer Use Topics  . Alcohol use: No  . Drug use: No    Home Medications Prior to Admission medications   Medication Sig Start Date End Date Taking? Authorizing Provider  aspirin 81 MG chewable tablet Chew 81 mg by mouth daily.   Yes [provider]  Ferrous Sulfate (IRON) 325 (65 Fe) MG TABS Take 325 mg by mouth daily.    Yes [provider]  hydrOXYzine (ATARAX/VISTARIL) 25 MG tablet TAKE 1 TABLET BY MOUTH THREE TIMES A DAY AS NEEDED Patient taking differently: Take 25 mg by mouth 3 (three) times daily as needed for anxiety.  11/06/18  Yes Stallings, Zoe A, MD  lisinopril (ZESTRIL) 20 MG tablet Take 1 tablet (20 mg total) by mouth daily. 02/22/19  Yes Stallings, Zoe A, MD  polyethylene glycol powder (GLYCOLAX/MIRALAX) 17 GM/SCOOP powder Mix 1 capful of powder in drink and take by mouth one to three times daily as needed for daily soft stools  OTC Patient taking differently: Take 0.5 Containers by mouth  daily as needed for mild constipation.  12/13/18  Yes Little, Wenda Overland, MD  rosuvastatin (CRESTOR) 40 MG tablet Take 1 tablet (40 mg total) by mouth daily. 09/30/18  Yes Stallings, Zoe A, MD  trimethoprim (TRIMPEX) 100 MG tablet Take 100 mg by mouth daily.   Yes [provider]    Allergies    Effexor [venlafaxine hcl] and Sulfa drugs cross reactors  Review of Systems   Review of Systems  Genitourinary:       Bladder prolapse  All other systems reviewed and are negative.   Physical Exam Updated Vital Signs BP 114/75   Pulse 78   Temp (!) 96.7 F (35.9 C) (Temporal)   Resp 15   SpO2 100%  Physical Exam Vitals and nursing note reviewed. Exam conducted with a chaperone present.  Constitutional:      General: She is not in acute distress.    Appearance: She is well-developed.     Comments: Appears nontoxic  HENT:     Head: Normocephalic and atraumatic.  Eyes:     Conjunctiva/sclera: Conjunctivae normal.     Pupils: Pupils are equal, round, and reactive to light.  Cardiovascular:     Rate and Rhythm: Normal rate and regular rhythm.     Pulses: Normal pulses.  Pulmonary:     Effort: Pulmonary effort is normal. No respiratory distress.     Breath sounds: Normal breath sounds. No wheezing.  Abdominal:     General: There is no distension.     Palpations: Abdomen is soft. There is no mass.     Tenderness: There is no abdominal tenderness. There is no guarding or rebound.  Genitourinary:    Comments: Prolapse noted on external exam. Tissue pink and nonbloody.  Musculoskeletal:        General: Normal range of motion.     Cervical back: Normal range of motion and neck supple.  Skin:    General: Skin is warm and dry.     Capillary Refill: Capillary refill takes less than 2 seconds.  Neurological:     Mental Status: She is alert and oriented to person, place, and time.     ED Results / Procedures / Treatments   Labs (all labs ordered are listed, but only  abnormal results are displayed) Labs Reviewed - No data to display  EKG None  Radiology No results found.  Procedures Procedures (including critical care time)  Medications Ordered in ED Medications - No data to display  ED Course  I have reviewed the triage vital signs and the nursing notes.  Pertinent labs & imaging results that were available during my care of the patient were reviewed by me and considered in my medical decision making (see chart for details).    MDM Rules/Calculators/A&P                      Pt presenting for evaluation of worsened pain due to bladder prolapse.  Physical exam shows patient appears nontoxic.  Patient symptoms are chronic.  No fevers, chills, nausea, vomiting, abdominal pain today.  No reported history of UTI and that patient does not have burning or other signs of infection.  Chronic urinary retention.  Patient is currently being managed with Dr. Perley Jain with urology and has a scheduled appointment with a urological gynecology provider in the next 2 weeks.  She will likely need further management for her prolapse, but this can be done outpatient.  I do not see any life-threatening or emergent condition at this time.  Case discussed with attending, Dr. Adela Lank evaluated patient.  At this time, patient appears safe for discharge.  Return precautions given.  Patient states she understand and agrees to plan.  Final Clinical Impression(s) / ED Diagnoses Final diagnoses:  Female bladder prolapse  Urge incontinence    Rx / DC Orders ED Discharge Orders    None       Alveria Apley, PA-C 04/21/19 2221    Melene Plan, DO 04/21/19 2302

## 2019-04-21 NOTE — ED Triage Notes (Signed)
BIB EMS from home. Presents with bladder prolapse through vagina. Hx of same, seen at [facility] September. Pt reports urge to void.

## 2019-04-21 NOTE — Discharge Instructions (Signed)
Call your urologist/gynecologist tomorrow to let them know your symptoms are worsening.  Follow up as scheduled.  Return to the ER if you develop fevers, severe abdominal pain, or any new, worsening, or concerning symptoms.

## 2019-04-21 NOTE — ED Notes (Signed)
Sophia PA has updated pt daughter on plan of care and that she will be discharged.

## 2019-04-24 DIAGNOSIS — R338 Other retention of urine: Secondary | ICD-10-CM | POA: Diagnosis not present

## 2019-04-25 ENCOUNTER — Telehealth: Payer: Self-pay | Admitting: Family Medicine

## 2019-04-25 DIAGNOSIS — R413 Other amnesia: Secondary | ICD-10-CM

## 2019-04-25 NOTE — Telephone Encounter (Signed)
pts daughter called and is concerned because Mom has been taking medicine wrong way . for cholesterol Daughters feels Mom has been taking too much . Please call  Regarding this [phone]   Fr

## 2019-04-26 NOTE — Telephone Encounter (Signed)
Spoke with pt's daughter [name] with some concerns regarding her mother. Daughter stated that last Friday 8th, her mother had been complaining that she was hurting and she was wondering why her mother would not take a bath because that is unusual for her not to want to take a bath. The daughter then noticed that uterus was hanging out so they took her to the ER but she was not admitted. It was said that the pt had been taking her medications wrong/taken too much medication and this is probably the cause. She has an appt with a gynecologist with Dr. Lysle Morales on the 20th of this month. She was wondering if you could place a referral to see a neuro for early dementia.  Please Advise

## 2019-04-27 DIAGNOSIS — N130 Hydronephrosis with ureteropelvic junction obstruction: Secondary | ICD-10-CM | POA: Diagnosis not present

## 2019-04-27 DIAGNOSIS — N139 Obstructive and reflux uropathy, unspecified: Secondary | ICD-10-CM | POA: Diagnosis not present

## 2019-04-28 ENCOUNTER — Ambulatory Visit (INDEPENDENT_AMBULATORY_CARE_PROVIDER_SITE_OTHER): Payer: MEDICARE | Admitting: Podiatry

## 2019-04-28 ENCOUNTER — Encounter: Payer: Self-pay | Admitting: Podiatry

## 2019-04-28 ENCOUNTER — Other Ambulatory Visit: Payer: Self-pay

## 2019-04-28 ENCOUNTER — Encounter: Payer: Self-pay | Admitting: Neurology

## 2019-04-28 DIAGNOSIS — M2011 Hallux valgus (acquired), right foot: Secondary | ICD-10-CM

## 2019-04-28 DIAGNOSIS — B351 Tinea unguium: Secondary | ICD-10-CM

## 2019-04-28 DIAGNOSIS — M2042 Other hammer toe(s) (acquired), left foot: Secondary | ICD-10-CM | POA: Diagnosis not present

## 2019-04-28 DIAGNOSIS — M79674 Pain in right toe(s): Secondary | ICD-10-CM | POA: Diagnosis not present

## 2019-04-28 DIAGNOSIS — M79675 Pain in left toe(s): Secondary | ICD-10-CM

## 2019-04-28 DIAGNOSIS — M2041 Other hammer toe(s) (acquired), right foot: Secondary | ICD-10-CM

## 2019-04-28 DIAGNOSIS — M2012 Hallux valgus (acquired), left foot: Secondary | ICD-10-CM | POA: Diagnosis not present

## 2019-04-28 NOTE — Telephone Encounter (Signed)
Referral has been placed. Please notify family.

## 2019-04-28 NOTE — Patient Instructions (Signed)

## 2019-05-01 ENCOUNTER — Ambulatory Visit: Payer: MEDICARE | Admitting: Podiatry

## 2019-05-01 NOTE — Progress Notes (Signed)
Subjective: [name] presents today referred by [name], MD with cc of painful, discolored, thick toenails x 1 year which interfere with daily activities.  Pain is aggravated when wearing enclosed shoe gear.   Daughter, [name], is present during the visit.  Past Medical History:  Diagnosis Date  . Anemia    on iron  . Anxiety   . Arthritis    shoulders, knees, hands, hips; status post left knee surgery  . Chest pain at rest 06/16/2018  . Chronic bronchitis (HCC)    CXR 07/08/16  . Depression   . GERD (gastroesophageal reflux disease)    TAKES TUMS  . H/O: pneumonia 2003  . Hyperlipidemia    on med  . Hypertension   . Morbid obesity with BMI of 45.0-49.9, adult (HCC)   . Shingles    recent 02/2017  . Thoracic aortic atherosclerosis (HCC)    CXR 07/08/16     Patient Active Problem List   Diagnosis Date Noted  . Chest pain, negative stress test. non cardiac. 06/16/2018  . Primary osteoarthritis of left hip 01/20/2017  . Primary osteoarthritis of right hip 11/10/2015  . Anxiety state 11/10/2015  . Preop cardiovascular exam 07/23/2014  . Bilateral edema of lower extremity 07/18/2013    Class: Chronic  . Metabolic syndrome 07/18/2013  . Essential hypertension 07/07/2012  . GERD (gastroesophageal reflux disease) 07/07/2012  . Hyperlipidemia with target LDL less than 100 07/07/2012  . Rotator cuff tear, left 03/02/2012  . Osteoarthritis of left shoulder 01/21/2012     Past Surgical History:  Procedure Laterality Date  . CARDIAC CATHETERIZATION  04/20/1996   patent coronaries, EF 74% (Dr. Aram Candela)  . CARPAL TUNNEL RELEASE Left   . CATARACT EXTRACTION, BILATERAL    . COLONOSCOPY    . KNEE SURGERY Left 11/2001  . REPLACEMENT TOTAL KNEE Left 10/2009  . REPLACEMENT TOTAL KNEE Left 10/2009  . SHOULDER OPEN ROTATOR CUFF REPAIR  03/01/2012   Procedure: ROTATOR CUFF REPAIR SHOULDER OPEN;  Surgeon: Mable Paris, MD;  Location: [facility] OR;  Service: Orthopedics;   Laterality: Left;  SUBSCAPULARIS REPAIR  . STRESS TEST - ADENOSINE THALLIUM  1997   apical and inferior segments with questionable infarct and ischemia in anteroseptal & inferoseptal segments (Dr. Standley Brooking)  . SVD     x 7  . TOTAL HIP ARTHROPLASTY Right 07/24/2016  . TOTAL HIP ARTHROPLASTY Right 07/24/2016   Procedure: RIGHT TOTAL HIP ARTHROPLASTY ANTERIOR APPROACH;  Surgeon: [name], MD;  Location: [facility] OR;  Service: Orthopedics;  Laterality: Right;  . TOTAL HIP ARTHROPLASTY Left 04/09/2017   Procedure: LEFT TOTAL HIP ARTHROPLASTY ANTERIOR APPROACH;  Surgeon: [name], MD;  Location: [facility];  Service: Orthopedics;  Laterality: Left;  . TOTAL SHOULDER ARTHROPLASTY  01/18/2012   Procedure: TOTAL SHOULDER ARTHROPLASTY;  Surgeon: Mable Paris, MD;  Location: [facility] OR;  Service: Orthopedics;  Laterality: Left;  . TOTAL SHOULDER REPLACEMENT Left 01/19/2012  . TRANSTHORACIC ECHOCARDIOGRAM  06/2011   LV mildly dilated, EF=>55%; mild mitral annular calcif, mild MR; mild TR; AV mildly sclerotic, mild aortic regurg  . TUBAL LIGATION    . WISDOM TOOTH EXTRACTION       Current Outpatient Medications on File Prior to Visit  Medication Sig Dispense Refill  . aspirin 81 MG chewable tablet Chew 81 mg by mouth daily.    . Ferrous Sulfate (IRON) 325 (65 Fe) MG TABS Take 325 mg by mouth daily.     . hydrOXYzine (ATARAX/VISTARIL)  25 MG tablet TAKE 1 TABLET BY MOUTH THREE TIMES A DAY AS NEEDED (Patient taking differently: Take 25 mg by mouth 3 (three) times daily as needed for anxiety. ) 90 tablet 3  . lisinopril (ZESTRIL) 20 MG tablet Take 1 tablet (20 mg total) by mouth daily. 90 tablet 3  . polyethylene glycol powder (GLYCOLAX/MIRALAX) 17 GM/SCOOP powder Mix 1 capful of powder in drink and take by mouth one to three times daily as needed for daily soft stools  OTC (Patient taking differently: Take 0.5 Containers by mouth daily as needed for mild constipation. ) 225 g 0  . rosuvastatin  (CRESTOR) 40 MG tablet Take 1 tablet (40 mg total) by mouth daily. 90 tablet 3  . trimethoprim (TRIMPEX) 100 MG tablet Take 100 mg by mouth daily.     No current facility-administered medications on file prior to visit.     Allergies  Allergen Reactions  . Effexor [Venlafaxine Hcl] Swelling and Other (See Comments)    MD ADVISES PATIENT TO NOT TAKE IN FUTURE PT STOPPED, MD DC'd MED  . Sulfa Drugs Cross Reactors Itching     Social History   Occupational History  . Occupation: retired    Associate Professor: RETIRED  Tobacco Use  . Smoking status: Former Smoker    Packs/day: 1.00    Years: 25.00    Pack years: 25.00    Types: Cigarettes    Quit date: 03/07/1984    Years since quitting: 35.1  . Smokeless tobacco: Former Engineer, water and Sexual Activity  . Alcohol use: No  . Drug use: No  . Sexual activity: Not Currently    Birth control/protection: Surgical     Family History  Problem Relation Age of Onset  . Heart disease Mother   . Heart disease Father   . Heart disease Sister   . Hypertension Sister   . Heart disease Sister   . Hypertension Sister   . Heart disease Sister   . Hypertension Sister      Immunization History  Administered Date(s) Administered  . Fluad Quad(high Dose 65+) 02/21/2019  . Influenza, High Dose Seasonal PF 01/08/2018  . Influenza,inj,Quad PF,6+ Mos 12/06/2014, 07/10/2016, 01/20/2017  . PPD Test 07/26/2016  . Pneumococcal Conjugate-13 12/06/2014  . Pneumococcal Polysaccharide-23 01/18/2009  . Td 08/28/2015     Review of systems: Positive Findings in bold print.  Constitutional:  chills, fatigue, fever, sweats, weight change Communication: Nurse, learning disability, sign Presenter, broadcasting, hand writing, iPad/Android device Head: headaches, head injury Eyes: changes in vision, eye pain, glaucoma, cataracts, macular degeneration, diplopia, glare,  light sensitivity, eyeglasses or contacts, blindness Ears nose mouth throat: hearing impaired, hearing aids,   ringing in ears, deaf, sign language,  vertigo,   nosebleeds,  rhinitis,  cold sores, snoring, swollen glands Cardiovascular: HTN, edema, arrhythmia, pacemaker in place, defibrillator in place, chest pain/tightness, chronic anticoagulation, blood clot, heart failure, MI Peripheral Vascular: leg cramps, varicose veins, blood clots, lymphedema, varicosities Respiratory:  difficulty breathing, denies congestion, SOB, wheezing, cough, emphysema Gastrointestinal: change in appetite or weight, abdominal pain, constipation, diarrhea, nausea, vomiting, vomiting blood, change in bowel habits, abdominal pain, jaundice, rectal bleeding, hemorrhoids, GERD Genitourinary:  nocturia,  pain on urination, polyuria,  blood in urine, Foley catheter, urinary urgency, ESRD on hemodialysis Musculoskeletal: amputation, cramping, stiff joints, painful joints, decreased joint motion, fractures, OA, gout, hemiplegia, paraplegia, uses cane, wheelchair bound, uses walker, uses rollator Skin: +changes in toenails, color change, dryness, itching, mole changes,  rash, wound(s) Neurological: headaches, numbness in  feet, paresthesias in feet, burning in feet, fainting,  seizures, change in speech. denies headaches, memory problems/poor historian, cerebral palsy, weakness, paralysis, CVA, TIA Endocrine: diabetes, hypothyroidism, hyperthyroidism,  goiter, dry mouth, flushing, heat intolerance,  cold intolerance,  excessive thirst, denies polyuria,  nocturia Hematological:  easy bleeding, excessive bleeding, easy bruising, enlarged lymph nodes, on long term blood thinner, history of past transusions Allergy/immunological:  hives, eczema, frequent infections, multiple drug allergies, seasonal allergies, transplant recipient, multiple food allergies Psychiatric:  anxiety, depression, mood disorder, suicidal ideations, hallucinations, insomnia  Objective: 79 y.o. AAF, obese, in NAD. AAO x 3.   Vascular Examination: Capillary refill  time to digits <3 seconds b/l.    Dorsalis pedis pulses 1/4 b/l.   Posterior tibial pulses 1/4 b/l.   Sparse digital hair x 10 digits.  Skin temperature gradient WNL b/l.  Trace edema b/l LE.  Dermatological Examination: Skin with normal turgor, texture and tone b/l.  Toenails 1-5 b/l discolored, thick, dystrophic with subungual debris and pain with palpation to nailbeds due to thickness of nails.  Musculoskeletal: Muscle strength 5/5 with plantarflexion, inversion and eversion b/l.  Dorsiflexion noted to be 2/5 b/l.  HAV with bunion deformity b/l. Hammertoe deformity b/l.  Neurological: Sensation intact 5/5 b/l with 10 gram monofilament.  Vibratory sensation intact b/l.  Assessment: 1. Painful onychomycosis toenails 1-5 b/l   Plan: 1. Discussed onychomycosis and treatment options.  Literature dispensed on today. 2. Toenails 1-5 b/l were debrided in length and girth without iatrogenic bleeding. 3. Patient to continue soft, supportive shoe gear daily. 4. Patient to report any pedal injuries to medical professional immediately. 5. Follow up 3 months.  6. Patient/POA to call should there be a concern in the interim.

## 2019-05-02 NOTE — Telephone Encounter (Signed)
Spoke with pt's daughter Carroll Sage To let her know that Creta Levin has placed the referral to neuro and to keep any F/U appts that are scheduled.

## 2019-05-03 DIAGNOSIS — N8111 Cystocele, midline: Secondary | ICD-10-CM | POA: Diagnosis not present

## 2019-05-03 DIAGNOSIS — N813 Complete uterovaginal prolapse: Secondary | ICD-10-CM | POA: Insufficient documentation

## 2019-05-03 DIAGNOSIS — R54 Age-related physical debility: Secondary | ICD-10-CM | POA: Diagnosis not present

## 2019-05-03 DIAGNOSIS — N816 Rectocele: Secondary | ICD-10-CM | POA: Diagnosis not present

## 2019-05-03 DIAGNOSIS — N3281 Overactive bladder: Secondary | ICD-10-CM | POA: Diagnosis not present

## 2019-05-03 DIAGNOSIS — N814 Uterovaginal prolapse, unspecified: Secondary | ICD-10-CM | POA: Diagnosis not present

## 2019-05-03 DIAGNOSIS — R338 Other retention of urine: Secondary | ICD-10-CM | POA: Diagnosis not present

## 2019-05-03 DIAGNOSIS — R339 Retention of urine, unspecified: Secondary | ICD-10-CM | POA: Insufficient documentation

## 2019-05-04 ENCOUNTER — Telehealth: Payer: Self-pay | Admitting: Family Medicine

## 2019-05-04 NOTE — Telephone Encounter (Signed)
Pt daughter came in to drop off FMLA paperwork   Paperwork placed in provider box ( provider lounge)   05/04/19

## 2019-05-05 ENCOUNTER — Encounter: Payer: Self-pay | Admitting: Cardiology

## 2019-05-05 ENCOUNTER — Ambulatory Visit (INDEPENDENT_AMBULATORY_CARE_PROVIDER_SITE_OTHER): Payer: MEDICARE | Admitting: Cardiology

## 2019-05-05 ENCOUNTER — Other Ambulatory Visit: Payer: Self-pay

## 2019-05-05 VITALS — BP 101/74 | HR 85 | Ht 60.0 in | Wt 160.0 lb

## 2019-05-05 DIAGNOSIS — Z01810 Encounter for preprocedural cardiovascular examination: Secondary | ICD-10-CM

## 2019-05-05 DIAGNOSIS — I10 Essential (primary) hypertension: Secondary | ICD-10-CM

## 2019-05-05 DIAGNOSIS — R600 Localized edema: Secondary | ICD-10-CM

## 2019-05-05 DIAGNOSIS — E785 Hyperlipidemia, unspecified: Secondary | ICD-10-CM | POA: Diagnosis not present

## 2019-05-05 MED ORDER — LISINOPRIL 20 MG PO TABS: 10.0000 mg | ORAL_TABLET | Freq: Every day | ORAL | 3 refills | Status: DC

## 2019-05-05 NOTE — Patient Instructions (Signed)
Medication Instructions:   Decrease Lisinopril to 1/2 tab (10 mg) daily until you see your PCP.  *If you need a refill on your cardiac medications before your next appointment, please call your pharmacy*   Follow-Up: At [facility], you and your health needs are our priority.  As part of our continuing mission to provide you with exceptional heart care, we have created designated Provider Care Teams.  These Care Teams include your primary Cardiologist (physician) and Advanced Practice Providers (APPs -  Physician Assistants and Nurse Practitioners) who all work together to provide you with the care you need, when you need it.  Your next appointment:   12 month(s)  The format for your next appointment:   In Person  Provider:   You may see Bryan Lemma, MD or one of the following Advanced Practice Providers on your designated Care Team:    Theodore Demark, PA-C  Joni Reining, DNP, ANP  Cadence Fransico Michael, NP   Other Instructions You have been cleared for procedure with Urology. OK to hold Aspirin for 5-7 days prior.

## 2019-05-05 NOTE — Progress Notes (Signed)
Primary Care Provider: [name], MD Cardiologist: Bryan Lemma, MD Electrophysiologist:   Clinic Note: Chief Complaint  Patient presents with  . Follow-up    Annual check for blood pressure and risk factors  . Pre-op Exam    Potential surgery for bladder prolapse    HPI:    ANALY BASSFORD is a 79 y.o. female with a PMH notable for HYPERTENSION and AORTIC SCLEROSIS below who presents today for ANNUAL FOLLOW-UP/PREOP EVALUATION.  [name] was last seen back in December 2019 as she has been seen for new annual follow-up since her heart catheterization in 1998.Marland Kitchen  She was doing well clinically from cardiac standpoint.  Was having issues with frequent bladder prolapse, and there was suggestion about needing hysterectomy.  Have a lot of discomfort.  No cardiac symptoms.  Recent Hospitalizations:   June 16, 2018: Admitted with chest pain-ruled out MI.  Normal echo and Myoview.  December 13, 2018: ER visit cystocele with prolapse/rectal bleeding  April 21, 2019: ER visit for prolapsed bladder  Reviewed  CV studies:    The following studies were reviewed today: (if available, images/films reviewed: From Epic Chart or Care Everywhere) . June 16, 2018-TTE: EF 60 to 65%.  GR 1 DD.  Normal mitral valve.  Mild aortic sclerosis but no stenosis. . June 17, 2018-Myoview: EF 60-65 %.  Apical thinning.  LOW RISK no ischemia or infarction.   Interval History:   VESNA KABLE is here today for I think that was supposed be an annual follow-up, but is also mostly complaining of symptoms related to her bladder prolapse.  On a positive note, she has lost about 35 to 40 pounds in the last year or so, partly because she is not eating as much and because of issues with his bladder that she is not feeling well. She not complain of any cardiac symptoms.  She is not walking much because it makes her urinate.  As a result, she is somewhat deconditioned and has exertional dyspnea.  No PND  orthopnea.  CV Review of Symptoms (Summary): no chest pain or dyspnea on exertion positive for - Mild exercise intolerance mostly because of deconditioning and obesity. negative for - edema, irregular heartbeat, palpitations, paroxysmal nocturnal dyspnea, rapid heart rate, shortness of breath or Syncope/near syncope, TIA/amaurosis fugax  The patient does not have symptoms concerning for COVID-19 infection (fever, chills, cough, or new shortness of breath).  The patient is practicing social distancing and masking.   REVIEWED OF SYSTEMS   A comprehensive ROS was performed. Review of Systems  Constitutional: Positive for malaise/fatigue and weight loss (Significant weight loss).  HENT: Negative for congestion and nosebleeds.   Respiratory: Negative for shortness of breath.   Gastrointestinal: Positive for abdominal pain and blood in stool (Related to bladder prolapse).  Genitourinary: Positive for dysuria, frequency, hematuria (Off and on from bladder prolapse) and urgency.  Musculoskeletal: Positive for back pain and joint pain.  Neurological: Positive for dizziness and weakness (Feels like her legs get weak on her sometimes). Negative for headaches.       Poor balance.  Thankfully no recent fall  Psychiatric/Behavioral: Positive for memory loss.       Most notable finding somewhat obvious, but also identified by her daughter is memory loss and confusion.  Worse than it had been.  All other systems reviewed and are negative.  I have reviewed and (if needed) personally updated the patient's problem list, medications, allergies, past medical and surgical history,  social and family history.   PAST MEDICAL HISTORY   Past Medical History:  Diagnosis Date  . Anemia    on iron  . Anxiety   . Arthritis    shoulders, knees, hands, hips; status post left knee surgery  . Chronic bronchitis ([place])    CXR 07/08/16  . Depression   . GERD (gastroesophageal reflux disease)    TAKES TUMS  . H/O:  pneumonia 2003  . Hyperlipidemia    on med  . Hypertension   . Morbid obesity with BMI of 45.0-49.9, adult ([place])   . Shingles    recent 02/2017  . Thoracic aortic atherosclerosis ([place])    CXR 07/08/16     PAST SURGICAL HISTORY   Past Surgical History:  Procedure Laterality Date  . CARDIAC CATHETERIZATION  04/20/1996   patent coronaries, EF 74% (Dr. Domenic Moras)  . CARPAL TUNNEL RELEASE Left   . CATARACT EXTRACTION, BILATERAL    . COLONOSCOPY    . KNEE SURGERY Left 11/2001  . NM MYOVIEW LTD  06/17/2018   EF 60-65 %.  Apical thinning.  LOW RISK no ischemia or infarction.  . REPLACEMENT TOTAL KNEE Left 10/2009  . REPLACEMENT TOTAL KNEE Left 10/2009  . SHOULDER OPEN ROTATOR CUFF REPAIR  03/01/2012   Procedure: ROTATOR CUFF REPAIR SHOULDER OPEN;  Surgeon: Nita Sells, MD;  Location: [facility];  Service: Orthopedics;  Laterality: Left;  SUBSCAPULARIS REPAIR  . SVD     x 7  . TOTAL HIP ARTHROPLASTY Right 07/24/2016  . TOTAL HIP ARTHROPLASTY Right 07/24/2016   Procedure: RIGHT TOTAL HIP ARTHROPLASTY ANTERIOR APPROACH;  Surgeon: Dorna Leitz, MD;  Location: [facility];  Service: Orthopedics;  Laterality: Right;  . TOTAL HIP ARTHROPLASTY Left 04/09/2017   Procedure: LEFT TOTAL HIP ARTHROPLASTY ANTERIOR APPROACH;  Surgeon: Dorna Leitz, MD;  Location: [facility];  Service: Orthopedics;  Laterality: Left;  . TOTAL SHOULDER ARTHROPLASTY  01/18/2012   Procedure: TOTAL SHOULDER ARTHROPLASTY;  Surgeon: Nita Sells, MD;  Location: [facility];  Service: Orthopedics;  Laterality: Left;  . TOTAL SHOULDER REPLACEMENT Left 01/19/2012  . TRANSTHORACIC ECHOCARDIOGRAM  06/16/2018   EF 60 to 65%.  GR 1 DD.  Normal mitral valve.  Mild aortic sclerosis but no stenosis.  . TUBAL LIGATION    . WISDOM TOOTH EXTRACTION       MEDICATIONS/ALLERGIES   Current Meds  Medication Sig  . aspirin 81 MG chewable tablet Chew 81 mg by mouth daily.  . Ferrous Sulfate (IRON) 325 (65 Fe) MG TABS Take 325 mg by mouth  daily.   . hydrOXYzine (ATARAX/VISTARIL) 25 MG tablet TAKE 1 TABLET BY MOUTH THREE TIMES A DAY AS NEEDED (Patient taking differently: Take 25 mg by mouth 3 (three) times daily as needed for anxiety. )  . lisinopril (ZESTRIL) 20 MG tablet Take 0.5 tablets (10 mg total) by mouth daily.  . polyethylene glycol powder (GLYCOLAX/MIRALAX) 17 GM/SCOOP powder Mix 1 capful of powder in drink and take by mouth one to three times daily as needed for daily soft stools  OTC (Patient taking differently: Take 0.5 Containers by mouth daily as needed for mild constipation. )  . rosuvastatin (CRESTOR) 40 MG tablet Take 1 tablet (40 mg total) by mouth daily.  Marland Kitchen trimethoprim (TRIMPEX) 100 MG tablet Take 100 mg by mouth daily.  . [DISCONTINUED] lisinopril (ZESTRIL) 20 MG tablet Take 1 tablet (20 mg total) by mouth daily.    Allergies  Allergen Reactions  . Effexor [Venlafaxine Hcl] Swelling  and Other (See Comments)    MD ADVISES PATIENT TO NOT TAKE IN FUTURE PT STOPPED, MD DC'd MED  . Sulfa Drugs Cross Reactors Itching     SOCIAL HISTORY/FAMILY HISTORY   Social History   Tobacco Use  . Smoking status: Former Smoker    Packs/day: 1.00    Years: 25.00    Pack years: 25.00    Types: Cigarettes    Quit date: 03/07/1984    Years since quitting: 35.1  . Smokeless tobacco: Former Engineer, water Use Topics  . Alcohol use: No  . Drug use: No   Social History   Social History Narrative   Marital status:   She is a widowed since 2009; married 45 years      Children:   mother of 7, grandmother of 5.      Lives: alone with home; daughter, son lives with patient      Tobacco: quit smoking 34 years ago      Alcohol: none      ADLs:  Walks with cane; drives.     She does not drink alcohol or smoke cigarettes.    Family History family history includes Heart disease in her father, mother, sister, sister, and sister; Hypertension in her sister, sister, and sister.   OBJCTIVE -PE, EKG, labs   Wt  Readings from Last 3 Encounters:  05/05/19 160 lb (72.6 kg)  09/30/18 192 lb 9.6 oz (87.4 kg)  06/17/18 194 lb 3.2 oz (88.1 kg)     Physical Exam: BP 101/74   Pulse 85   Ht 5' (1.524 m)   Wt 160 lb (72.6 kg)   SpO2 97%   BMI 31.25 kg/m  Physical Exam  Constitutional: She is oriented to person, place, and time. She appears well-developed and well-nourished. No distress.  Obese but, but with significant weight loss.. Seems a little anxious today and confused.  Notably uncomfortable sitting.  HENT:  Head: Normocephalic and atraumatic.  Neck: No hepatojugular reflux and no JVD present. Carotid bruit is not present. No thyromegaly present.  Cardiovascular: Normal rate, regular rhythm, S1 normal, S2 normal and intact distal pulses.  No extrasystoles are present. PMI is not displaced. Exam reveals distant heart sounds. Exam reveals no gallop and no friction rub.  Murmur (Soft 1/6 SEM at RUSB) heard. Pulmonary/Chest: Effort normal and breath sounds normal. No respiratory distress. She has no wheezes. She has no rales.  Abdominal: Soft. Bowel sounds are normal. She exhibits no distension. There is no abdominal tenderness.  Musculoskeletal:        General: Edema (Trivial) present. Normal range of motion.     Cervical back: Normal range of motion and neck supple.  Neurological: She is alert and oriented to person, place, and time.  Psychiatric: She has a normal mood and affect.  A little bit more confused than usual today.  Her daughter answers most of the questions now.  Showing signs of memory loss  Vitals reviewed.    Adult ECG Report Not checked  Recent Labs:  Due for labs with PCP.  Lab Results  Component Value Date   CHOL 181 02/21/2018   HDL 87 02/21/2018   LDLCALC 84 02/21/2018   TRIG 49 02/21/2018   CHOLHDL 2.1 02/21/2018   Lab Results  Component Value Date   CREATININE 0.85 12/13/2018   BUN 17 12/13/2018   NA 141 12/13/2018   K 4.2 12/13/2018   CL 102 12/13/2018    CO2 28 12/13/2018  ASSESSMENT/PLAN    Problem List Items Addressed This Visit    Essential hypertension (Chronic)    Actually little hypotensive today.  With her having dizziness.  Would recommend backing down to half dose of lisinopril.  Would also potentially consider holding ACE inhibitor on the day of surgery if required      Relevant Medications   lisinopril (ZESTRIL) 20 MG tablet   Hyperlipidemia with target LDL less than 100 (Chronic)    On stable dose of rosuvastatin.  Lipids are to be checked soon.  Were doing much better as of November 2019.  LDL was 84, which is acceptable for her risk factors.      Relevant Medications   lisinopril (ZESTRIL) 20 MG tablet   Bilateral edema of lower extremity (Chronic)    Well-controlled now.  Not on diuretic      Preop cardiovascular exam - Primary    Bruce really has not ever had any active cardiac symptoms.  She just had a Myoview and echocardiogram done within the past year that were both relatively normal.  Her limitations are not related to any cardiac issues.  No further cardiac evaluation will be required for the surgery.  She is taking aspirin which can be held for any particular surgeries. She is on lisinopril which I would hold on the day of surgery.  Most likely bladder surgery would be considered a low risk surgery in a patient with no stroke history, normal renal function, nondiabetic and no active cardiac symptoms.    LOW RISK patient for LOW RISK surgery          COVID-19 Education: The signs and symptoms of COVID-19 were discussed with the patient and how to seek care for testing (follow up with PCP or arrange E-visit).   The importance of social distancing was discussed today.  I spent a total of with the patient and chart review. >  50% of the time was spent in direct patient consultation.  Additional time spent with chart review (studies, outside notes, etc): 8 Total Time: 26 min   Current  medicines are reviewed at length with the patient today.  (+/- concerns) none   Patient Instructions / Medication Changes & Studies & Tests Ordered   Patient Instructions  Medication Instructions:   Decrease Lisinopril to 1/2 tab (10 mg) daily until you see your PCP.  *If you need a refill on your cardiac medications before your next appointment, please call your pharmacy*   Follow-Up: At [facility], you and your health needs are our priority.  As part of our continuing mission to provide you with exceptional heart care, we have created designated Provider Care Teams.  These Care Teams include your primary Cardiologist (physician) and Advanced Practice Providers (APPs -  Physician Assistants and Nurse Practitioners) who all work together to provide you with the care you need, when you need it.  Your next appointment:   12 month(s)  The format for your next appointment:   In Person  Provider:   You may see Bryan Lemma, MD or one of the following Advanced Practice Providers on your designated Care Team:    Theodore Demark, PA-C  Joni Reining, DNP, ANP  Cadence Fransico Michael, NP   Other Instructions You have been cleared for procedure with Urology. OK to hold Aspirin for 5-7 days prior.     Studies Ordered:   No orders of the defined types were placed in this encounter.    Bryan Lemma, M.D., M.S.  Interventional Cardiologist   Pager # [phone] Phone # [phone] [address] [place], [place] [address]   Thank you for choosing Heartcare at [facility]!!

## 2019-05-07 ENCOUNTER — Encounter: Payer: Self-pay | Admitting: Cardiology

## 2019-05-07 NOTE — Assessment & Plan Note (Signed)
Well-controlled now.  Not on diuretic

## 2019-05-07 NOTE — Assessment & Plan Note (Signed)
On stable dose of rosuvastatin.  Lipids are to be checked soon.  Were doing much better as of November 2019.  LDL was 84, which is acceptable for her risk factors.

## 2019-05-07 NOTE — Assessment & Plan Note (Signed)
Actually little hypotensive today.  With her having dizziness.  Would recommend backing down to half dose of lisinopril.  Would also potentially consider holding ACE inhibitor on the day of surgery if required

## 2019-05-07 NOTE — Assessment & Plan Note (Signed)
Bruce really has not ever had any active cardiac symptoms.  She just had a Myoview and echocardiogram done within the past year that were both relatively normal.  Her limitations are not related to any cardiac issues.  No further cardiac evaluation will be required for the surgery.  She is taking aspirin which can be held for any particular surgeries. She is on lisinopril which I would hold on the day of surgery.  Most likely bladder surgery would be considered a low risk surgery in a patient with no stroke history, normal renal function, nondiabetic and no active cardiac symptoms.    LOW RISK patient for LOW RISK surgery

## 2019-05-08 NOTE — Telephone Encounter (Signed)
Forms have been located. I have filled out clinic information and given it to the provider.   FMLA form is for pt daughter as caregiver for our pt [name].   Thanks

## 2019-05-08 NOTE — Telephone Encounter (Signed)
Pt will be paying the form fee when she comes to pick it up   She will be needing the forms this week

## 2019-05-08 NOTE — Telephone Encounter (Signed)
Forms completed and placed upfront for pt daughter to pick up.  I have informed pt's daughter that the form completed today is for her to have care for mom and take her to her appointments. It is not for the surgery because the surgeon will do the FMLA form for that once the surgery is done.   Pt daughter stated that she will pick them up tomorrow and she will pay the fee then.

## 2019-05-10 DIAGNOSIS — K5900 Constipation, unspecified: Secondary | ICD-10-CM | POA: Diagnosis not present

## 2019-05-10 DIAGNOSIS — N813 Complete uterovaginal prolapse: Secondary | ICD-10-CM | POA: Diagnosis not present

## 2019-05-10 DIAGNOSIS — R339 Retention of urine, unspecified: Secondary | ICD-10-CM | POA: Diagnosis not present

## 2019-05-10 DIAGNOSIS — Z4689 Encounter for fitting and adjustment of other specified devices: Secondary | ICD-10-CM | POA: Diagnosis not present

## 2019-05-10 DIAGNOSIS — R54 Age-related physical debility: Secondary | ICD-10-CM | POA: Diagnosis not present

## 2019-05-14 ENCOUNTER — Other Ambulatory Visit: Payer: Self-pay | Admitting: Family Medicine

## 2019-06-08 DIAGNOSIS — Z4689 Encounter for fitting and adjustment of other specified devices: Secondary | ICD-10-CM | POA: Diagnosis not present

## 2019-06-08 DIAGNOSIS — R339 Retention of urine, unspecified: Secondary | ICD-10-CM | POA: Diagnosis not present

## 2019-06-08 DIAGNOSIS — N813 Complete uterovaginal prolapse: Secondary | ICD-10-CM | POA: Diagnosis not present

## 2019-06-09 ENCOUNTER — Ambulatory Visit: Payer: MEDICARE | Attending: Internal Medicine

## 2019-06-09 DIAGNOSIS — Z23 Encounter for immunization: Secondary | ICD-10-CM

## 2019-06-09 NOTE — Progress Notes (Signed)
   Covid-19 Vaccination Clinic  Name:  [name]    MRN: [id number] DOB: May 21, 1940  06/09/2019  [name] was observed post Covid-19 immunization for 15 minutes without incidence. She was provided with Vaccine Information Sheet and instruction to access the V-Safe system.   [name] was instructed to call 911 with any severe reactions post vaccine: Marland Kitchen Difficulty breathing  . Swelling of your face and throat  . A fast heartbeat  . A bad rash all over your body  . Dizziness and weakness    Immunizations Administered    Name Date Dose VIS Date Route   Pfizer COVID-19 Vaccine 06/09/2019 12:08 PM 0.3 mL 03/24/2019 Intramuscular   Manufacturer: [employer], [employer]   Lot: [id number]   NDC: [address]-3

## 2019-06-26 DIAGNOSIS — R339 Retention of urine, unspecified: Secondary | ICD-10-CM | POA: Diagnosis not present

## 2019-06-28 DIAGNOSIS — N3281 Overactive bladder: Secondary | ICD-10-CM | POA: Diagnosis not present

## 2019-06-28 DIAGNOSIS — K5900 Constipation, unspecified: Secondary | ICD-10-CM | POA: Diagnosis not present

## 2019-06-28 DIAGNOSIS — R339 Retention of urine, unspecified: Secondary | ICD-10-CM | POA: Diagnosis not present

## 2019-06-28 DIAGNOSIS — N813 Complete uterovaginal prolapse: Secondary | ICD-10-CM | POA: Diagnosis not present

## 2019-07-04 ENCOUNTER — Encounter: Payer: Self-pay | Admitting: Neurology

## 2019-07-04 ENCOUNTER — Ambulatory Visit (INDEPENDENT_AMBULATORY_CARE_PROVIDER_SITE_OTHER): Payer: MEDICARE | Admitting: Neurology

## 2019-07-04 ENCOUNTER — Other Ambulatory Visit: Payer: MEDICARE

## 2019-07-04 ENCOUNTER — Other Ambulatory Visit: Payer: Self-pay

## 2019-07-04 VITALS — BP 119/81 | HR 63 | Ht 60.0 in | Wt 171.2 lb

## 2019-07-04 DIAGNOSIS — F03A0 Unspecified dementia, mild, without behavioral disturbance, psychotic disturbance, mood disturbance, and anxiety: Secondary | ICD-10-CM

## 2019-07-04 DIAGNOSIS — F0390 Unspecified dementia without behavioral disturbance: Secondary | ICD-10-CM

## 2019-07-04 MED ORDER — DONEPEZIL HCL 10 MG PO TABS: ORAL_TABLET | ORAL | 11 refills | Status: DC

## 2019-07-04 NOTE — Patient Instructions (Addendum)
You have a brain condition called dementia that is affecting your memory, needing more help.  1. Bloodwork for TSH, B12  2. Schedule MRI brain without contrast  3. Start Donepezil 10mg : Take 1/2 tablet daily for 2 weeks, then increase to 1 tablet daily  4. No driving  5. Follow-up in 6 months, call for any changes   FALL PRECAUTIONS: Be cautious when walking. Scan the area for obstacles that may increase the risk of trips and falls. When getting up in the mornings, sit up at the edge of the bed for a few minutes before getting out of bed. Consider elevating the bed at the head end to avoid drop of blood pressure when getting up. Walk always in a well-lit room (use night lights in the walls). Avoid area rugs or power cords from appliances in the middle of the walkways. Use a walker or a cane if necessary and consider physical therapy for balance exercise. Get your eyesight checked regularly.   HOME SAFETY: Consider the safety of the kitchen when operating appliances like stoves, microwave oven, and blender. Consider having supervision and share cooking responsibilities until no longer able to participate in those. Accidents with firearms and other hazards in the house should be identified and addressed as well.   ABILITY TO BE LEFT ALONE: If patient is unable to contact 911 operator, consider using LifeLine, or when the need is there, arrange for someone to stay with patients. Smoking is a fire hazard, consider supervision or cessation. Risk of wandering should be assessed by caregiver and if detected at any point, supervision and safe proof recommendations should be instituted.   RECOMMENDATIONS FOR ALL PATIENTS WITH MEMORY PROBLEMS: 1. Continue to exercise (Recommend 30 minutes of walking everyday, or 3 hours every week) 2. Increase social interactions - continue going to [place] and enjoy social gatherings with friends and family 3. Eat healthy, avoid fried foods and eat more fruits and  vegetables 4. Maintain adequate blood pressure, blood sugar, and blood cholesterol level. Reducing the risk of stroke and cardiovascular disease also helps promoting better memory. 5. Avoid stressful situations. Live a simple life and avoid aggravations. Organize your time and prepare for the next day in anticipation. 6. Sleep well, avoid any interruptions of sleep and avoid any distractions in the bedroom that may interfere with adequate sleep quality 7. Avoid sugar, avoid sweets as there is a strong link between excessive sugar intake, diabetes, and cognitive impairment The Mediterranean diet has been shown to help patients reduce the risk of progressive memory disorders and reduces cardiovascular risk. This includes eating fish, eat fruits and green leafy vegetables, nuts like almonds and hazelnuts, walnuts, and also use olive oil. Avoid fast foods and fried foods as much as possible. Avoid sweets and sugar as sugar use has been linked to worsening of memory function.  There is always a concern of gradual progression of memory problems. If this is the case, then we may need to adjust level of care according to patient needs. Support, both to the patient and caregiver, should then be put into place.

## 2019-07-04 NOTE — Progress Notes (Signed)
NEUROLOGY CONSULTATION NOTE  [name] MRN: [id number] DOB: [id number]  Referring provider: Dr. Collie Siad Primary care provider: Dr. Collie Siad  Reason for consult:  Memory loss  Dear Dr Creta Levin:  Thank you for your kind referral of [name] for consultation of the above symptoms. Although her history is well known to you, please allow me to reiterate it for the purpose of our medical record. The patient was accompanied to the clinic by her daughter who also provides collateral information. Records and images were personally reviewed where available.   HISTORY OF PRESENT ILLNESS: This is a 79 year old right-handed woman with a history of hypertension, hyperlipidemia, uterine prolapse, presenting for evaluation of memory loss. She feels her memory "sometimes comes and goes, especially when doing something." Her daughter [name] is present during the visit and reports memory changes worsened around 6 months ago. [name] has lived with her since 2005. Around 6 months ago, [name] started noticing that she was not taking her medications correctly, the pharmacy notified her that something was going on, she was refilling medications too early. [name] started managing medications at that time, and also started taking over with finances because she would forget her zip code or information on cashier checks. The patient reports that she would write a check and get nervous, she would miss one number. She stopped driving 3 months ago, family have not let her drive due to the pandemic, but also have been concerned about accidents. She denies getting lost driving. She misplaces things frequently. She stopped cooking 2-3 months ago when she put a pot in the microwave. She is able to bathe and dress herself, but [name] had noted some hygiene issues with the uterine prolapse, she was not washing fully and there would be a smell. One time she had different clothes on with a sock on her head, ready to go  somewhere. She is able to do the laundry and dishes but has a harder time following instructions. [name] has noticed personality changes, she is more irritable and defiant, when upset talks very loudly. There may be some hallucinations/delusions, she would say there is something in the couch and she feels it when sitting on it. Her 2 sisters have dementia. No history of significant head injuries or alcohol use.   She has occasional numbness in her right thumb. Her right heel hurts and feels numb. She has constipation and urinary incontinence, catheter recently taken out after procedure for uterine prolapse. She has rare hand tremors when upset. She denies any headaches, dizziness, vision changes, neck/back pain, anosmia. Sleep is good. No falls.    PAST MEDICAL HISTORY: Past Medical History:  Diagnosis Date  . Anemia    on iron  . Anxiety   . Arthritis    shoulders, knees, hands, hips; status post left knee surgery  . Chronic bronchitis (HCC)    CXR 07/08/16  . Depression   . GERD (gastroesophageal reflux disease)    TAKES TUMS  . H/O: pneumonia 2003  . Hyperlipidemia    on med  . Hypertension   . Morbid obesity with BMI of 45.0-49.9, adult (HCC)   . Shingles    recent 02/2017  . Thoracic aortic atherosclerosis (HCC)    CXR 07/08/16    PAST SURGICAL HISTORY: Past Surgical History:  Procedure Laterality Date  . CARDIAC CATHETERIZATION  04/20/1996   patent coronaries, EF 74% (Dr. Aram Candela)  . CARPAL TUNNEL RELEASE Left   . CATARACT EXTRACTION, BILATERAL    .  COLONOSCOPY    . KNEE SURGERY Left 11/2001  . NM MYOVIEW LTD  06/17/2018   EF 60-65 %.  Apical thinning.  LOW RISK no ischemia or infarction.  . REPLACEMENT TOTAL KNEE Left 10/2009  . REPLACEMENT TOTAL KNEE Left 10/2009  . SHOULDER OPEN ROTATOR CUFF REPAIR  03/01/2012   Procedure: ROTATOR CUFF REPAIR SHOULDER OPEN;  Surgeon: Mable Paris, MD;  Location: [facility] OR;  Service: Orthopedics;  Laterality: Left;  SUBSCAPULARIS  REPAIR  . SVD     x 7  . TOTAL HIP ARTHROPLASTY Right 07/24/2016  . TOTAL HIP ARTHROPLASTY Right 07/24/2016   Procedure: RIGHT TOTAL HIP ARTHROPLASTY ANTERIOR APPROACH;  Surgeon: [name], MD;  Location: [facility] OR;  Service: Orthopedics;  Laterality: Right;  . TOTAL HIP ARTHROPLASTY Left 04/09/2017   Procedure: LEFT TOTAL HIP ARTHROPLASTY ANTERIOR APPROACH;  Surgeon: [name], MD;  Location: [facility] ORS;  Service: Orthopedics;  Laterality: Left;  . TOTAL SHOULDER ARTHROPLASTY  01/18/2012   Procedure: TOTAL SHOULDER ARTHROPLASTY;  Surgeon: Mable Paris, MD;  Location: [facility] OR;  Service: Orthopedics;  Laterality: Left;  . TOTAL SHOULDER REPLACEMENT Left 01/19/2012  . TRANSTHORACIC ECHOCARDIOGRAM  06/16/2018   EF 60 to 65%.  GR 1 DD.  Normal mitral valve.  Mild aortic sclerosis but no stenosis.  . TUBAL LIGATION    . WISDOM TOOTH EXTRACTION      MEDICATIONS: Current Outpatient Medications on File Prior to Visit  Medication Sig Dispense Refill  . aspirin 81 MG chewable tablet Chew 81 mg by mouth daily.    . hydrOXYzine (ATARAX/VISTARIL) 25 MG tablet TAKE 1 TABLET BY MOUTH THREE TIMES A DAY AS NEEDED 90 tablet 3  . lisinopril (ZESTRIL) 20 MG tablet Take 0.5 tablets (10 mg total) by mouth daily. 45 tablet 3  . polyethylene glycol powder (GLYCOLAX/MIRALAX) 17 GM/SCOOP powder Mix 1 capful of powder in drink and take by mouth one to three times daily as needed for daily soft stools  OTC (Patient taking differently: Take 0.5 Containers by mouth daily as needed for mild constipation. ) 225 g 0  . rosuvastatin (CRESTOR) 40 MG tablet Take 1 tablet (40 mg total) by mouth daily. 90 tablet 3  . trimethoprim (TRIMPEX) 100 MG tablet Take 100 mg by mouth daily.     No current facility-administered medications on file prior to visit.    ALLERGIES: Allergies  Allergen Reactions  . Effexor [Venlafaxine Hcl] Swelling and Other (See Comments)    MD ADVISES PATIENT TO NOT TAKE IN FUTURE PT STOPPED,  MD DC'd MED  . Sulfa Drugs Cross Reactors Itching    FAMILY HISTORY: Family History  Problem Relation Age of Onset  . Heart disease Mother   . Heart disease Father   . Heart disease Sister   . Hypertension Sister   . Heart disease Sister   . Hypertension Sister   . Heart disease Sister   . Hypertension Sister     SOCIAL HISTORY: Social History   Socioeconomic History  . Marital status: Widowed    Spouse name: Not on file  . Number of children: 7  . Years of education: Not on file  . Highest education level: Not on file  Occupational History  . Occupation: retired    Associate Professor: RETIRED  Tobacco Use  . Smoking status: Former Smoker    Packs/day: 1.00    Years: 25.00    Pack years: 25.00    Types: Cigarettes    Quit date: 03/07/1984  Years since quitting: 35.3  . Smokeless tobacco: Former Network engineer and Sexual Activity  . Alcohol use: No  . Drug use: No  . Sexual activity: Not Currently    Birth control/protection: Surgical  Other Topics Concern  . Not on file  Social History Narrative   Marital status:   She is a widowed since 2009; married 60 years      Children:   mother of 50, grandmother of 65.      Lives: alone with home; daughter, son lives with patient      Tobacco: quit smoking 34 years ago      Alcohol: none      ADLs:  Walks with cane; drives.     She does not drink alcohol or smoke cigarettes.   Right handed    Social Determinants of Health   Financial Resource Strain:   . Difficulty of Paying Living Expenses:   Food Insecurity:   . Worried About Charity fundraiser in the Last Year:   . Arboriculturist in the Last Year:   Transportation Needs:   . Film/video editor (Medical):   Marland Kitchen Lack of Transportation (Non-Medical):   Physical Activity:   . Days of Exercise per Week:   . Minutes of Exercise per Session:   Stress:   . Feeling of Stress :   Social Connections:   . Frequency of Communication with Friends and Family:   .  Frequency of Social Gatherings with Friends and Family:   . Attends Religious Services:   . Active Member of Clubs or Organizations:   . Attends Archivist Meetings:   Marland Kitchen Marital Status:   Intimate Partner Violence:   . Fear of Current or Ex-Partner:   . Emotionally Abused:   Marland Kitchen Physically Abused:   . Sexually Abused:     PHYSICAL EXAM: Vitals:   07/04/19 1038  BP: 119/81  Pulse: 63  SpO2: 99%   General: No acute distress Skin/Extremities: No rash, no edema Neurological Exam: Mental status: alert and oriented to person, place, and time, no dysarthria or aphasia, Fund of knowledge is appropriate.  Recent and remote memory are impaired. Attention and concentration are reduced. SLUMS score 13/30 [employer] Mental Exam 07/04/2019  Weekday Correct 1  Current year 1  What state are we in? 1  Amount spent 1  Amount left 0  # of Animals 2  5 objects recall 3  Number series 0  Hour markers 0  Time correct 0  Placed X in triangle correctly 1  Largest Figure 1  Name of female 0  Date back to work 0  Type of work 2  State she lived in 0  Total score 13    Cranial nerves: CN I: not tested CN II: pupils equal, round and reactive to light, visual fields intact CN III, IV, VI:  full range of motion, no nystagmus, no ptosis CN V: facial sensation intact CN VII: upper and lower face symmetric CN VIII: hearing intact to conversation Bulk & Tone: normal, no fasciculations. Motor: unable to abduct left arm above shoulder, otherwise 5/5 Sensation: intact to light touch, cold, pin, decreased vibration to right ankle. Romberg test negative Deep Tendon Reflexes: +1 throughout, no ankle clonus Plantar responses: downgoing bilaterally Cerebellar: no incoordination on finger to nose testing Gait: slow and cautious, no ataxia Tremor: none  IMPRESSION: This is a 79 year old right-handed woman with a history of hypertension, hyperlipidemia, uterine prolapse, presenting  for evaluation of memory loss. Her neurological exam is non-focal except for difficulty lifting left arm above shoulder level, SLUMS score 13/30. Onset and progression of dementia seems rather acute in the setting of the past 6 months now with difficulties with complex tasks. Check TSH and B12. MRI brain without contrast will be ordered to assess for underlying structural abnormality. We discussed starting Donepezil, including side effects and expectations, start 5mg  daily for 2 weeks, then increase to 10mg  daily. No further driving. Follow-up in 6 months, they know to call for any changes.   Thank you for allowing me to participate in the care of this patient. Please do not hesitate to call for any questions or concerns.   , M.D.  CC: Dr. 

## 2019-07-05 ENCOUNTER — Ambulatory Visit: Payer: MEDICARE | Attending: Internal Medicine

## 2019-07-05 DIAGNOSIS — Z23 Encounter for immunization: Secondary | ICD-10-CM

## 2019-07-05 LAB — VITAMIN B12: Vitamin B-12: 413 pg/mL (ref 200–1100)

## 2019-07-05 LAB — TSH: TSH: 0.76 mIU/L (ref 0.40–4.50)

## 2019-07-05 NOTE — Progress Notes (Signed)
   Covid-19 Vaccination Clinic  Name:  [name]    MRN: [id number] DOB: 1940-08-17  07/05/2019  [name] was observed post Covid-19 immunization for 15 minutes without incident. She was provided with Vaccine Information Sheet and instruction to access the V-Safe system.   [name] was instructed to call 911 with any severe reactions post vaccine: Marland Kitchen Difficulty breathing  . Swelling of face and throat  . A fast heartbeat  . A bad rash all over body  . Dizziness and weakness   Immunizations Administered    Name Date Dose VIS Date Route   Pfizer COVID-19 Vaccine 07/05/2019  8:45 AM 0.3 mL 03/24/2019 Intramuscular   Manufacturer: [employer], [employer]   Lot: [id number]   NDC: [address]-5

## 2019-07-06 ENCOUNTER — Telehealth: Payer: Self-pay

## 2019-07-06 NOTE — Telephone Encounter (Signed)
-----   Message from [name], MD sent at 07/06/2019  9:47 AM EDT ----- Pls let daughter know thyroid and B12 levels are normal. Thanks

## 2019-07-06 NOTE — Telephone Encounter (Signed)
Pt daughter called and informed that thyroid and B12 levels are normal

## 2019-07-19 ENCOUNTER — Telehealth: Payer: Self-pay | Admitting: *Deleted

## 2019-07-19 NOTE — Telephone Encounter (Signed)
Schedule AWV.  

## 2019-07-20 NOTE — Telephone Encounter (Signed)
Patient's daughter called in to schedule appointment for AWV. Please advise and call back

## 2019-07-20 NOTE — Telephone Encounter (Signed)
Please schedule for AWV.

## 2019-08-04 ENCOUNTER — Other Ambulatory Visit: Payer: Self-pay

## 2019-08-04 ENCOUNTER — Encounter: Payer: Self-pay | Admitting: Podiatry

## 2019-08-04 ENCOUNTER — Ambulatory Visit (INDEPENDENT_AMBULATORY_CARE_PROVIDER_SITE_OTHER): Payer: MEDICARE | Admitting: Podiatry

## 2019-08-04 VITALS — Temp 96.7°F

## 2019-08-04 DIAGNOSIS — M79675 Pain in left toe(s): Secondary | ICD-10-CM

## 2019-08-04 DIAGNOSIS — M79674 Pain in right toe(s): Secondary | ICD-10-CM | POA: Diagnosis not present

## 2019-08-04 DIAGNOSIS — B351 Tinea unguium: Secondary | ICD-10-CM

## 2019-08-04 NOTE — Patient Instructions (Addendum)
Corns and Calluses Corns are small areas of thickened skin that occur on the top, sides, or tip of a toe. They contain a cone-shaped core with a point that can press on a nerve below. This causes pain.  Calluses are areas of thickened skin that can occur anywhere on the body, including the hands, fingers, palms, soles of the feet, and heels. Calluses are usually larger than corns. What are the causes? Corns and calluses are caused by rubbing (friction) or pressure, such as from shoes that are too tight or do not fit properly. What increases the risk? Corns are more likely to develop in people who have misshapen toes (toe deformities), such as hammer toes. Calluses can occur with friction to any area of the skin. They are more likely to develop in people who:  Work with their hands.  Wear shoes that fit poorly, are too tight, or are high-heeled.  Have toe deformities. What are the signs or symptoms? Symptoms of a corn or callus include:  A hard growth on the skin.  Pain or tenderness under the skin.  Redness and swelling.  Increased discomfort while wearing tight-fitting shoes, if your feet are affected. If a corn or callus becomes infected, symptoms may include:  Redness and swelling that gets worse.  Pain.  Fluid, blood, or pus draining from the corn or callus. How is this diagnosed? Corns and calluses may be diagnosed based on your symptoms, your medical history, and a physical exam. How is this treated? Treatment for corns and calluses may include:  Removing the cause of the friction or pressure. This may involve: ? Changing your shoes. ? Wearing shoe inserts (orthotics) or other protective layers in your shoes, such as a corn pad. ? Wearing gloves.  Applying medicine to the skin (topical medicine) to help soften skin in the hardened, thickened areas.  Removing layers of dead skin with a file to reduce the size of the corn or callus.  Removing the corn or callus with a  scalpel or laser.  Taking antibiotic medicines, if your corn or callus is infected.  Having surgery, if a toe deformity is the cause. Follow these instructions at home:   Take over-the-counter and prescription medicines only as told by your health care provider.  If you were prescribed an antibiotic, take it as told by your health care provider. Do not stop taking it even if your condition starts to improve.  Wear shoes that fit well. Avoid wearing high-heeled shoes and shoes that are too tight or too loose.  Wear any padding, protective layers, gloves, or orthotics as told by your health care provider.  Soak your hands or feet and then use a file or pumice stone to soften your corn or callus. Do this as told by your health care provider.  Check your corn or callus every day for symptoms of infection. Contact a health care provider if you:  Notice that your symptoms do not improve with treatment.  Have redness or swelling that gets worse.  Notice that your corn or callus becomes painful.  Have fluid, blood, or pus coming from your corn or callus.  Have new symptoms. Summary  Corns are small areas of thickened skin that occur on the top, sides, or tip of a toe.  Calluses are areas of thickened skin that can occur anywhere on the body, including the hands, fingers, palms, and soles of the feet. Calluses are usually larger than corns.  Corns and calluses are caused by   rubbing (friction) or pressure, such as from shoes that are too tight or do not fit properly.  Treatment may include wearing any padding, protective layers, gloves, or orthotics as told by your health care provider. This information is not intended to replace advice given to you by your health care provider. Make sure you discuss any questions you have with your health care provider. Document Revised: 07/20/2018 Document Reviewed: 02/10/2017 Elsevier Patient Education  [place]  WHAT IS IT? An infection that lies within the keratin of your nail plate that is caused by a fungus.  WHY ME? Fungal infections affect all ages, sexes, races, and creeds.  There may be many factors that predispose you to a fungal infection such as age, coexisting medical conditions such as diabetes, or an autoimmune disease; stress, medications, fatigue, genetics, etc.  Bottom line: fungus thrives in a warm, moist environment and your shoes offer such a location.  IS IT CONTAGIOUS? Theoretically, yes.  You do not want to share shoes, nail clippers or files with someone who has fungal toenails.  Walking around barefoot in the same room or sleeping in the same bed is unlikely to transfer the organism.  It is important to realize, however, that fungus can spread easily from one nail to the next on the same foot.  HOW DO WE TREAT THIS?  There are several ways to treat this condition.  Treatment may depend on many factors such as age, medications, pregnancy, liver and kidney conditions, etc.  It is best to ask your doctor which options are available to you.  4. No treatment.   Unlike many other medical concerns, you can live with this condition.  However for many people this can be a painful condition and may lead to ingrown toenails or a bacterial infection.  It is recommended that you keep the nails cut short to help reduce the amount of fungal nail. 5. Topical treatment.  These range from herbal remedies to prescription strength nail lacquers.  About 40-50% effective, topicals require twice daily application for approximately 9 to 12 months or until an entirely new nail has grown out.  The most effective topicals are medical grade medications available through physicians offices. 6. Oral antifungal medications.  With an 80-90% cure rate, the most common oral medication requires 3 to 4 months of therapy and stays in your system for a year as the new nail grows out.  Oral antifungal medications do  require blood work to make sure it is a safe drug for you.  A liver function panel will be performed prior to starting the medication and after the first month of treatment.  It is important to have the blood work performed to avoid any harmful side effects.  In general, this medication safe but blood work is required. 7. Laser Therapy.  This treatment is performed by applying a specialized laser to the affected nail plate.  This therapy is noninvasive, fast, and non-painful.  It is not covered by insurance and is therefore, out of pocket.  The results have been very good with a 80-95% cure rate.  [facility] is the only practice in the area to offer this therapy. Permanent Nail Avulsion.  Removing the entire nail so that a new nail will not grow back.Bunion  A bunion is a bump on the base of the big toe that forms when the bones of the big toe joint move out of position. Bunions may be  small at first, but they often get larger over time. They can make walking painful. What are the causes? A bunion may be caused by:  Wearing narrow or pointed shoes that force the big toe to press against the other toes.  Abnormal foot development that causes the foot to roll inward (pronate).  Changes in the foot that are caused by certain diseases, such as rheumatoid arthritis or polio.  A foot injury. What increases the risk? The following factors may make you more likely to develop this condition:  Wearing shoes that squeeze the toes together.  Having certain diseases, such as: ? Rheumatoid arthritis. ? Polio. ? Cerebral palsy.  Having family members who have bunions.  Being born with a foot deformity, such as flat feet or low arches.  Doing activities that put a lot of pressure on the feet, such as ballet dancing. What are the signs or symptoms? The main symptom of a bunion is a noticeable bump on the big toe. Other symptoms may include:  Pain.  Swelling around the big toe.  Redness  and inflammation.  Thick or hardened skin on the big toe or between the toes.  Stiffness or loss of motion in the big toe.  Trouble with walking. How is this diagnosed? A bunion may be diagnosed based on your symptoms, medical history, and activities. You may have tests, such as:  X-rays. These allow your health care provider to check the position of the bones in your foot and look for damage to your joint. They also help your health care provider determine the severity of your bunion and the best way to treat it.  Joint aspiration. In this test, a sample of fluid is removed from the toe joint. This test may be done if you are in a lot of pain. It helps rule out diseases that cause painful swelling of the joints, such as arthritis. How is this treated? Treatment depends on the severity of your symptoms. The goal of treatment is to relieve symptoms and prevent the bunion from getting worse. Your health care provider may recommend:  Wearing shoes that have a wide toe box.  Using bunion pads to cushion the affected area.  Taping your toes together to keep them in a normal position.  Placing a device inside your shoe (orthotics) to help reduce pressure on your toe joint.  Taking medicine to ease pain, inflammation, and swelling.  Applying heat or ice to the affected area.  Doing stretching exercises.  Surgery to remove scar tissue and move the toes back into their normal position. This treatment is rare. Follow these instructions at home: Managing pain, stiffness, and swelling   If directed, put ice on the painful area: ? Put ice in a plastic bag. ? Place a towel between your skin and the bag. ? Leave the ice on for 20 minutes, 2-3 times a day. Activity   If directed, apply heat to the affected area before you exercise. Use the heat source that your health care provider recommends, such as a moist heat pack or a heating pad. ? Place a towel between your skin and the heat  source. ? Leave the heat on for 20-30 minutes. ? Remove the heat if your skin turns bright red. This is especially important if you are unable to feel pain, heat, or cold. You may have a greater risk of getting burned.  Do exercises as told by your health care provider. General instructions  Support your toe joint with  proper footwear, shoe padding, or taping as told by your health care provider.  Take over-the-counter and prescription medicines only as told by your health care provider.  Keep all follow-up visits as told by your health care provider. This is important. Contact a health care provider if your symptoms:  Get worse.  Do not improve in 2 weeks. Get help right away if you have:  Severe pain and trouble with walking. Summary  A bunion is a bump on the base of the big toe that forms when the bones of the big toe joint move out of position.  Bunions can make walking painful.  Treatment depends on the severity of your symptoms.  Support your toe joint with proper footwear, shoe padding, or taping as told by your health care provider. This information is not intended to replace advice given to you by your health care provider. Make sure you discuss any questions you have with your health care provider. Document Revised: 10/04/2017 Document Reviewed: 08/10/2017 Elsevier Patient Education  2020 [employer]  Hammer Toe  Hammer toe is a change in the shape (a deformity) of your toe. The deformity causes the middle joint of your toe to stay bent. This causes pain, especially when you are wearing shoes. Hammer toe starts gradually. At first, the toe can be straightened. Gradually over time, the deformity becomes stiff and permanent. Early treatments to keep the toe straight may relieve pain. As the deformity becomes stiff and permanent, surgery may be needed to straighten the toe. What are the causes? Hammer toe is caused by abnormal bending of the toe joint that is closest  to your foot. It happens gradually over time. This pulls on the muscles and connections (tendons) of the toe joint, making them weak and stiff. It is often related to wearing shoes that are too short or narrow and do not let your toes straighten. What increases the risk? You may be at greater risk for hammer toe if you:  Are female.  Are older.  Wear shoes that are too small.  Wear high-heeled shoes that pinch your toes.  Are a Advertising account planner.  Have a second toe that is longer than your big toe (first toe).  Injure your foot or toe.  Have arthritis.  Have a family history of hammer toe.  Have a nerve or muscle disorder. What are the signs or symptoms? The main symptoms of this condition are pain and deformity of the toe. The pain is worse when wearing shoes, walking, or running. Other symptoms may include:  Corns or calluses over the bent part of the toe or between the toes.  Redness and a burning feeling on the toe.  An open sore that forms on the top of the toe.  Not being able to straighten the toe. How is this diagnosed? This condition is diagnosed based on your symptoms and a physical exam. During the exam, your health care provider will try to straighten your toe to see how stiff the deformity is. You may also have tests, such as:  A blood test to check for rheumatoid arthritis.  An X-ray to show how severe the deformity is. How is this treated? Treatment for this condition will depend on how stiff the deformity is. Surgery is often needed. However, sometimes a hammer toe can be straightened without surgery. Treatments that do not involve surgery include:  Taping the toe into a straightened position.  Using pads and cushions to protect the toe (orthotics).  Wearing  shoes that provide enough room for the toes.  Doing toe-stretching exercises at home.  Taking an NSAID to reduce pain and swelling. If these treatments do not help or the toe cannot be straightened,  surgery is the next option. The most common surgeries used to straighten a hammer toe include:  Arthroplasty. In this procedure, part of the joint is removed, and that allows the toe to straighten.  Fusion. In this procedure, cartilage between the two bones of the joint is taken out and the bones are fused together into one longer bone.  Implantation. In this procedure, part of the bone is removed and replaced with an implant to let the toe move again.  Flexor tendon transfer. In this procedure, the tendons that curl the toes down (flexor tendons) are repositioned. Follow these instructions at home:  Take over-the-counter and prescription medicines only as told by your health care provider.  Do toe straightening and stretching exercises as told by your health care provider.  Keep all follow-up visits as told by your health care provider. This is important. How is this prevented?  Wear shoes that give your toes enough room and do not cause pain.  Do not wear high-heeled shoes. Contact a health care provider if:  Your pain gets worse.  Your toe becomes red or swollen.  You develop an open sore on your toe. This information is not intended to replace advice given to you by your health care provider. Make sure you discuss any questions you have with your health care provider. Document Revised: 03/12/2017 Document Reviewed: 07/24/2015 Elsevier Patient Education  2020 [employer].

## 2019-08-07 ENCOUNTER — Other Ambulatory Visit: Payer: Self-pay

## 2019-08-07 ENCOUNTER — Ambulatory Visit
Admission: RE | Admit: 2019-08-07 | Discharge: 2019-08-07 | Disposition: A | Discharge status: Home / Self Care | Payer: MEDICARE | Source: Ambulatory Visit | Attending: Neurology | Admitting: Neurology

## 2019-08-07 DIAGNOSIS — F03A0 Unspecified dementia, mild, without behavioral disturbance, psychotic disturbance, mood disturbance, and anxiety: Secondary | ICD-10-CM

## 2019-08-07 DIAGNOSIS — F0390 Unspecified dementia without behavioral disturbance: Secondary | ICD-10-CM

## 2019-08-08 ENCOUNTER — Telehealth: Payer: Self-pay

## 2019-08-08 NOTE — Telephone Encounter (Signed)
Spoke to pt daughter MRI brain did not show any evidence of tumor, stroke, or bleed. It showed age-related changes. Pt daughter verbalized understanding

## 2019-08-08 NOTE — Telephone Encounter (Signed)
-----   Message from [name], MD sent at 08/08/2019 12:22 PM EDT ----- Pls let patient/ family know the MRI brain did not show any evidence of tumor, stroke, or bleed. It showed age-related changes. thanks

## 2019-08-11 NOTE — Progress Notes (Signed)
Subjective:  [name] presents to clinic today with cc of  painful, thick, discolored, elongated toenails  of both feet that become tender and patient cannot cut because of thickness. Pain is aggravated when wearing enclosed shoe gear and relieved with periodic professional debridement.  Patient voices no new pedal concerns on today's visit.  Medications reviewed in chart.  Allergies  Allergen Reactions  . Effexor [Venlafaxine Hcl] Swelling and Other (See Comments)    MD ADVISES PATIENT TO NOT TAKE IN FUTURE PT STOPPED, MD DC'd MED  . Sulfa Drugs Cross Reactors Itching     Objective:  Physical Examination:  Vascular Examination: Capillary fill time to digits <3 seconds b/l. Faintly palpable pedal pulses b/l. Pedal hair sparse b/l. Skin temperature gradient within normal limits b/l. Trace edema noted b/l feet.  Dermatological Examination: Pedal skin with normal turgor, texture and tone bilaterally. No open wounds bilaterally. No interdigital macerations bilaterally. Toenails 1-5 b/l elongated, dystrophic, thickened, crumbly with subungual debris and tenderness to dorsal palpation.  Musculoskeletal Examination: No pain crepitus or joint limitation noted with ROM b/l. Hallux valgus with bunion deformity noted b/l. Hammertoes noted to the 2-5 bilaterally. Muscle strength 5/5 b/l with plantarflexion, inversion and eversion b/l. DF noted to be 2/5 b/l.  Neurological Examination: Protective sensation intact 5/5 intact bilaterally with 10g monofilament b/l. Vibratory sensation intact b/l. Proprioception intact bilaterally.  Assessment: 1. Pain due to onychomycosis of toenails of both feet    Plan: -Toenails 1-5 b/l were debrided in length and girth with sterile nail nippers and dremel without iatrogenic bleeding.  -Patient to continue soft, supportive shoe gear daily. -Patient to report any pedal injuries to medical professional immediately. -Patient/POA to call should there be  question/concern in the interim.

## 2019-08-21 ENCOUNTER — Other Ambulatory Visit: Payer: Self-pay

## 2019-08-21 ENCOUNTER — Ambulatory Visit: Payer: MEDICARE | Admitting: Family Medicine

## 2019-08-21 ENCOUNTER — Encounter: Payer: Self-pay | Admitting: Family Medicine

## 2019-08-21 DIAGNOSIS — I10 Essential (primary) hypertension: Secondary | ICD-10-CM

## 2019-08-21 MED ORDER — LISINOPRIL 10 MG PO TABS: 10.0000 mg | ORAL_TABLET | Freq: Every day | ORAL | 1 refills | Status: DC

## 2019-08-21 NOTE — Patient Instructions (Addendum)
° ° ° °  If you have lab work done today you will be contacted with your lab results within the next 2 weeks.  If you have not heard from us then please contact us. The fastest way to get your results is to register for My Chart. ° ° °IF you received an x-ray today, you will receive an invoice from [facility]. Please contact [facility] at [phone] with questions or concerns regarding your invoice.  ° °IF you received labwork today, you will receive an invoice from [facility]. Please contact [facility] at [phone] with questions or concerns regarding your invoice.  ° °Our billing staff will not be able to assist you with questions regarding bills from these companies. ° °You will be contacted with the lab results as soon as they are available. The fastest way to get your results is to activate your My Chart account. Instructions are located on the last page of this paperwork. If you have not heard from us regarding the results in 2 weeks, please contact this office. °  ° ° ° °

## 2019-08-21 NOTE — Progress Notes (Incomplete)
Established Patient Office Visit  Subjective:  Patient ID: [name], female    DOB: 10-13-1940  Age: 79 y.o. MRN: [id number]  CC:  Chief Complaint  Patient presents with  . Follow-up    f/u on MRI which shows no evidence of tumor, stroke, or bleed. It showed age-related changes. Which patient was informed of the MRI results    HPI [name] presents for  Dementia Patient is with Glenis Smoker. Patient reports that she was started on Aricept and she has some bizarre dreams. Patient is here with her daughter to discuss the MRI.   Muscle cramps She has some muscle cramps occasionally She states that she is taking her lisinopril 10mg  She is also on crestor   Past Medical History:  Diagnosis Date  . Anemia    on iron  . Anxiety   . Arthritis    shoulders, knees, hands, hips; status post left knee surgery  . Chronic bronchitis (HCC)    CXR 07/08/16  . Depression   . GERD (gastroesophageal reflux disease)    TAKES TUMS  . H/O: pneumonia 2003  . Hyperlipidemia    on med  . Hypertension   . Morbid obesity with BMI of 45.0-49.9, adult (HCC)   . Shingles    recent 02/2017  . Thoracic aortic atherosclerosis (HCC)    CXR 07/08/16    Past Surgical History:  Procedure Laterality Date  . CARDIAC CATHETERIZATION  04/20/1996   patent coronaries, EF 74% (Dr. 06/18/1996)  . CARPAL TUNNEL RELEASE Left   . CATARACT EXTRACTION, BILATERAL    . COLONOSCOPY    . KNEE SURGERY Left 11/2001  . NM MYOVIEW LTD  06/17/2018   EF 60-65 %.  Apical thinning.  LOW RISK no ischemia or infarction.  . REPLACEMENT TOTAL KNEE Left 10/2009  . REPLACEMENT TOTAL KNEE Left 10/2009  . SHOULDER OPEN ROTATOR CUFF REPAIR  03/01/2012   Procedure: ROTATOR CUFF REPAIR SHOULDER OPEN;  Surgeon: 03/03/2012, MD;  Location: [facility] OR;  Service: Orthopedics;  Laterality: Left;  SUBSCAPULARIS REPAIR  . SVD     x 7  . TOTAL HIP ARTHROPLASTY Right 07/24/2016  . TOTAL HIP ARTHROPLASTY Right 07/24/2016     Procedure: RIGHT TOTAL HIP ARTHROPLASTY ANTERIOR APPROACH;  Surgeon: 07/26/2016, MD;  Location: [facility] OR;  Service: Orthopedics;  Laterality: Right;  . TOTAL HIP ARTHROPLASTY Left 04/09/2017   Procedure: LEFT TOTAL HIP ARTHROPLASTY ANTERIOR APPROACH;  Surgeon: 04/11/2017, MD;  Location: [facility];  Service: Orthopedics;  Laterality: Left;  . TOTAL SHOULDER ARTHROPLASTY  01/18/2012   Procedure: TOTAL SHOULDER ARTHROPLASTY;  Surgeon: 03/19/2012, MD;  Location: [facility] OR;  Service: Orthopedics;  Laterality: Left;  . TOTAL SHOULDER REPLACEMENT Left 01/19/2012  . TRANSTHORACIC ECHOCARDIOGRAM  06/16/2018   EF 60 to 65%.  GR 1 DD.  Normal mitral valve.  Mild aortic sclerosis but no stenosis.  . TUBAL LIGATION    . WISDOM TOOTH EXTRACTION      Family History  Problem Relation Age of Onset  . Heart disease Mother   . Heart disease Father   . Heart disease Sister   . Hypertension Sister   . Heart disease Sister   . Hypertension Sister   . Heart disease Sister   . Hypertension Sister     Social History   Socioeconomic History  . Marital status: Widowed    Spouse name: Not on file  . Number of children: 7  . Years  of education: Not on file  . Highest education level: Not on file  Occupational History  . Occupation: retired    Associate Professor: RETIRED  Tobacco Use  . Smoking status: Former Smoker    Packs/day: 1.00    Years: 25.00    Pack years: 25.00    Types: Cigarettes    Quit date: 03/07/1984    Years since quitting: 35.4  . Smokeless tobacco: Former Engineer, water and Sexual Activity  . Alcohol use: No  . Drug use: No  . Sexual activity: Not Currently    Birth control/protection: Surgical  Other Topics Concern  . Not on file  Social History Narrative   Marital status:   She is a widowed since 2009; married 45 years      Children:   mother of 74, grandmother of 5.      Lives: alone with home; daughter, son lives with patient      Tobacco: quit smoking 34 years ago       Alcohol: none      ADLs:  Walks with cane; drives.     She does not drink alcohol or smoke cigarettes.   Right handed    Social Determinants of Health   Financial Resource Strain:   . Difficulty of Paying Living Expenses:   Food Insecurity:   . Worried About Programme researcher, broadcasting/film/video in the Last Year:   . Barista in the Last Year:   Transportation Needs:   . Freight forwarder (Medical):   Marland Kitchen Lack of Transportation (Non-Medical):   Physical Activity:   . Days of Exercise per Week:   . Minutes of Exercise per Session:   Stress:   . Feeling of Stress :   Social Connections:   . Frequency of Communication with Friends and Family:   . Frequency of Social Gatherings with Friends and Family:   . Attends Religious Services:   . Active Member of Clubs or Organizations:   . Attends Banker Meetings:   Marland Kitchen Marital Status:   Intimate Partner Violence:   . Fear of Current or Ex-Partner:   . Emotionally Abused:   Marland Kitchen Physically Abused:   . Sexually Abused:     Outpatient Medications Prior to Visit  Medication Sig Dispense Refill  . aspirin 81 MG chewable tablet Chew 81 mg by mouth daily.    Marland Kitchen donepezil (ARICEPT) 10 MG tablet Take 1/2 tablet daily for 2 weeks, then increase to 1 tablet daily 30 tablet 11  . hydrOXYzine (ATARAX/VISTARIL) 25 MG tablet TAKE 1 TABLET BY MOUTH THREE TIMES A DAY AS NEEDED 90 tablet 3  . lisinopril (ZESTRIL) 20 MG tablet Take 0.5 tablets (10 mg total) by mouth daily. 45 tablet 3  . polyethylene glycol powder (GLYCOLAX/MIRALAX) 17 GM/SCOOP powder Mix 1 capful of powder in drink and take by mouth one to three times daily as needed for daily soft stools  OTC (Patient taking differently: Take 0.5 Containers by mouth daily as needed for mild constipation. ) 225 g 0  . rosuvastatin (CRESTOR) 40 MG tablet Take 1 tablet (40 mg total) by mouth daily. 90 tablet 3  . trimethoprim (TRIMPEX) 100 MG tablet Take 100 mg by mouth daily.     No  facility-administered medications prior to visit.    Allergies  Allergen Reactions  . Effexor [Venlafaxine Hcl] Swelling and Other (See Comments)    MD ADVISES PATIENT TO NOT TAKE IN FUTURE PT STOPPED, MD DC'd MED  .  Sulfa Drugs Cross Reactors Itching    ROS Review of Systems    Objective:    Physical Exam  BP 99/74 (BP Location: Right Arm, Patient Position: Sitting, Cuff Size: Normal)   Pulse 63   Temp 97.9 F (36.6 C) (Temporal)   Ht 5\' 1"  (1.549 m)   Wt 159 lb 3.2 oz (72.2 kg)   SpO2 99%   BMI 30.08 kg/m  Wt Readings from Last 3 Encounters:  08/21/19 159 lb 3.2 oz (72.2 kg)  07/04/19 171 lb 3.2 oz (77.7 kg)  05/05/19 160 lb (72.6 kg)     Health Maintenance Due  Topic Date Due  . DEXA SCAN  Never done    There are no preventive care reminders to display for this patient.  Lab Results  Component Value Date   TSH 0.76 07/04/2019   Lab Results  Component Value Date   WBC 5.7 12/13/2018   HGB 11.7 (L) 12/13/2018   HCT 37.6 12/13/2018   MCV 96.2 12/13/2018   PLT 232 12/13/2018   Lab Results  Component Value Date   NA 141 12/13/2018   K 4.2 12/13/2018   CO2 28 12/13/2018   GLUCOSE 98 12/13/2018   BUN 17 12/13/2018   CREATININE 0.85 12/13/2018   BILITOT 0.4 12/13/2018   ALKPHOS 53 12/13/2018   AST 21 12/13/2018   ALT 11 12/13/2018   PROT 7.3 12/13/2018   ALBUMIN 4.0 12/13/2018   CALCIUM 11.1 (H) 12/13/2018   ANIONGAP 11 12/13/2018   Lab Results  Component Value Date   CHOL 181 02/21/2018   Lab Results  Component Value Date   HDL 87 02/21/2018   Lab Results  Component Value Date   LDLCALC 84 02/21/2018   Lab Results  Component Value Date   TRIG 49 02/21/2018   Lab Results  Component Value Date   CHOLHDL 2.1 02/21/2018   Lab Results  Component Value Date   HGBA1C 5.8 05/01/2012      Assessment & Plan:   Problem List Items Addressed This Visit    None      No orders of the defined types were placed in this  encounter.   Follow-up: No follow-ups on file.    Forrest Moron, MD

## 2019-08-22 LAB — BASIC METABOLIC PANEL
BUN/Creatinine Ratio: 16 (ref 12–28)
BUN: 18 mg/dL (ref 8–27)
CO2: 24 mmol/L (ref 20–29)
Calcium: 10.3 mg/dL (ref 8.7–10.3)
Chloride: 101 mmol/L (ref 96–106)
Creatinine, Ser: 1.15 mg/dL — ABNORMAL HIGH (ref 0.57–1.00)
GFR calc Af Amer: 53 mL/min/{1.73_m2} — ABNORMAL LOW (ref >59)
GFR calc non Af Amer: 46 mL/min/{1.73_m2} — ABNORMAL LOW (ref >59)
Glucose: 81 mg/dL (ref 65–99)
Potassium: 4.6 mmol/L (ref 3.5–5.2)
Sodium: 141 mmol/L (ref 134–144)

## 2019-08-29 DIAGNOSIS — Z4689 Encounter for fitting and adjustment of other specified devices: Secondary | ICD-10-CM | POA: Diagnosis not present

## 2019-08-29 DIAGNOSIS — N813 Complete uterovaginal prolapse: Secondary | ICD-10-CM | POA: Diagnosis not present

## 2019-09-06 ENCOUNTER — Telehealth: Payer: Self-pay | Admitting: Family Medicine

## 2019-09-06 NOTE — Telephone Encounter (Signed)
Messgae sent to [place] on 07/19/2019

## 2019-10-25 ENCOUNTER — Other Ambulatory Visit: Payer: Self-pay

## 2019-10-25 DIAGNOSIS — E785 Hyperlipidemia, unspecified: Secondary | ICD-10-CM

## 2019-10-25 MED ORDER — ROSUVASTATIN CALCIUM 40 MG PO TABS: 40.0000 mg | ORAL_TABLET | Freq: Every day | ORAL | 0 refills | Status: DC

## 2019-11-03 ENCOUNTER — Ambulatory Visit: Payer: MEDICARE | Admitting: Podiatry

## 2019-11-14 ENCOUNTER — Other Ambulatory Visit: Payer: Self-pay | Admitting: Family Medicine

## 2019-11-14 DIAGNOSIS — E785 Hyperlipidemia, unspecified: Secondary | ICD-10-CM

## 2019-11-30 DIAGNOSIS — K5900 Constipation, unspecified: Secondary | ICD-10-CM | POA: Diagnosis not present

## 2019-11-30 DIAGNOSIS — Z4689 Encounter for fitting and adjustment of other specified devices: Secondary | ICD-10-CM | POA: Diagnosis not present

## 2019-11-30 DIAGNOSIS — N813 Complete uterovaginal prolapse: Secondary | ICD-10-CM | POA: Diagnosis not present

## 2019-11-30 DIAGNOSIS — R339 Retention of urine, unspecified: Secondary | ICD-10-CM | POA: Diagnosis not present

## 2019-12-12 ENCOUNTER — Telehealth: Payer: Self-pay | Admitting: Cardiology

## 2019-12-12 ENCOUNTER — Other Ambulatory Visit: Payer: Self-pay | Admitting: Neurology

## 2019-12-12 DIAGNOSIS — E785 Hyperlipidemia, unspecified: Secondary | ICD-10-CM

## 2019-12-12 DIAGNOSIS — I10 Essential (primary) hypertension: Secondary | ICD-10-CM

## 2019-12-12 NOTE — Telephone Encounter (Signed)
OK to refill all meds requested? Even non-cardiac medications?

## 2019-12-12 NOTE — Telephone Encounter (Signed)
Patient daughter needs to speak to someone about a couple of the medications patient is on please call

## 2019-12-12 NOTE — Telephone Encounter (Signed)
    Pt c/o medication issue:  1. Name of Medication:   hydrOXYzine (ATARAX/VISTARIL) 25 MG tablet    lisinopril (ZESTRIL) 10 MG tablet    rosuvastatin (CRESTOR) 40 MG tablet    trimethoprim (TRIMPEX) 100 MG tablet   2. How are you currently taking this medication (dosage and times per day)?   3. Are you having a reaction (difficulty breathing--STAT)?   4. What is your medication issue? Pt's daughter calling, would like to ask DR. Herbie Baltimore if he can refill these medications for pt. She said pt's p[facility] is no longer with [facility] and she is about to ran out of meds. If Dr. Herbie Baltimore can refill medications please send [facility] to [id number] [place], [place] - [address]

## 2019-12-14 MED ORDER — ROSUVASTATIN CALCIUM 40 MG PO TABS: 40.0000 mg | ORAL_TABLET | Freq: Every day | ORAL | 1 refills | Status: DC

## 2019-12-14 MED ORDER — TRIMETHOPRIM 100 MG PO TABS
100.0000 mg | ORAL_TABLET | Freq: Every day | ORAL | 0 refills | Status: DC
Start: 2019-12-14 — End: 2020-05-20

## 2019-12-14 MED ORDER — LISINOPRIL 10 MG PO TABS: 10.0000 mg | ORAL_TABLET | Freq: Every day | ORAL | 1 refills | Status: DC

## 2019-12-14 MED ORDER — HYDROXYZINE HCL 25 MG PO TABS: 25.0000 mg | ORAL_TABLET | Freq: Three times a day (TID) | ORAL | 0 refills | Status: DC | PRN

## 2019-12-14 NOTE — Telephone Encounter (Signed)
[facility] for patient/daughter that MD will refill lisinopril/crestor and one time refill of the other 2 meds, with request to defer future refills to (new) PCP

## 2019-12-14 NOTE — Telephone Encounter (Signed)
I usually would not do the Trimpex and Vistaril.  If she is having a hard time getting her PCP to do it I am happy to do it, but this should probably be through the PCP just for follow-up on refills. Bryan Lemma, MD

## 2019-12-15 NOTE — Telephone Encounter (Signed)
Returned call. Left message for a call back.

## 2020-01-26 ENCOUNTER — Encounter: Payer: Self-pay | Admitting: Neurology

## 2020-01-26 ENCOUNTER — Ambulatory Visit (INDEPENDENT_AMBULATORY_CARE_PROVIDER_SITE_OTHER): Payer: MEDICARE | Admitting: Neurology

## 2020-01-26 ENCOUNTER — Other Ambulatory Visit: Payer: Self-pay

## 2020-01-26 VITALS — BP 89/54 | HR 80 | Ht 61.0 in | Wt 140.8 lb

## 2020-01-26 DIAGNOSIS — F03A0 Unspecified dementia, mild, without behavioral disturbance, psychotic disturbance, mood disturbance, and anxiety: Secondary | ICD-10-CM

## 2020-01-26 DIAGNOSIS — E785 Hyperlipidemia, unspecified: Secondary | ICD-10-CM | POA: Diagnosis not present

## 2020-01-26 DIAGNOSIS — F0390 Unspecified dementia without behavioral disturbance: Secondary | ICD-10-CM | POA: Diagnosis not present

## 2020-01-26 MED ORDER — ROSUVASTATIN CALCIUM 40 MG PO TABS: 40.0000 mg | ORAL_TABLET | Freq: Every day | ORAL | 1 refills | Status: DC

## 2020-01-26 MED ORDER — DONEPEZIL HCL 10 MG PO TABS
ORAL_TABLET | ORAL | 3 refills | Status: DC
Start: 2020-01-26 — End: 2020-01-26

## 2020-01-26 MED ORDER — HYDROXYZINE HCL 25 MG PO TABS
25.0000 mg | ORAL_TABLET | Freq: Three times a day (TID) | ORAL | 3 refills | Status: DC | PRN
Start: 2020-01-26 — End: 2020-06-10

## 2020-01-26 MED ORDER — DONEPEZIL HCL 10 MG PO TABS
ORAL_TABLET | ORAL | 3 refills | Status: DC
Start: 2020-01-26 — End: 2020-03-03

## 2020-01-26 MED ORDER — HYDROXYZINE HCL 25 MG PO TABS
25.0000 mg | ORAL_TABLET | Freq: Three times a day (TID) | ORAL | 3 refills | Status: DC | PRN
Start: 2020-01-26 — End: 2020-01-26

## 2020-01-26 NOTE — Patient Instructions (Signed)
1. Bloodwork for CBC, CMP, iron studies  2. Continue Aricept 10mg  daily  3. Refills for hydroxyzine and Crestor were sent until she can follow-up with new PCP  4. Follow-up in 6-8 months, call for any changes   FALL PRECAUTIONS: Be cautious when walking. Scan the area for obstacles that may increase the risk of trips and falls. When getting up in the mornings, sit up at the edge of the bed for a few minutes before getting out of bed. Consider elevating the bed at the head end to avoid drop of blood pressure when getting up. Walk always in a well-lit room (use night lights in the walls). Avoid area rugs or power cords from appliances in the middle of the walkways. Use a walker or a cane if necessary and consider physical therapy for balance exercise. Get your eyesight checked regularly.  HOME SAFETY: Consider the safety of the kitchen when operating appliances like stoves, microwave oven, and blender. Consider having supervision and share cooking responsibilities until no longer able to participate in those. Accidents with firearms and other hazards in the house should be identified and addressed as well.   ABILITY TO BE LEFT ALONE: If patient is unable to contact 911 operator, consider using LifeLine, or when the need is there, arrange for someone to stay with patients. Smoking is a fire hazard, consider supervision or cessation. Risk of wandering should be assessed by caregiver and if detected at any point, supervision and safe proof recommendations should be instituted.  RECOMMENDATIONS FOR ALL PATIENTS WITH MEMORY PROBLEMS: 1. Continue to exercise (Recommend 30 minutes of walking everyday, or 3 hours every week) 2. Increase social interactions - continue going to [place] and enjoy social gatherings with friends and family 3. Eat healthy, avoid fried foods and eat more fruits and vegetables 4. Maintain adequate blood pressure, blood sugar, and blood cholesterol level. Reducing the risk of stroke  and cardiovascular disease also helps promoting better memory. 5. Avoid stressful situations. Live a simple life and avoid aggravations. Organize your time and prepare for the next day in anticipation. 6. Sleep well, avoid any interruptions of sleep and avoid any distractions in the bedroom that may interfere with adequate sleep quality 7. Avoid sugar, avoid sweets as there is a strong link between excessive sugar intake, diabetes, and cognitive impairment The Mediterranean diet has been shown to help patients reduce the risk of progressive memory disorders and reduces cardiovascular risk. This includes eating fish, eat fruits and green leafy vegetables, nuts like almonds and hazelnuts, walnuts, and also use olive oil. Avoid fast foods and fried foods as much as possible. Avoid sweets and sugar as sugar use has been linked to worsening of memory function.  There is always a concern of gradual progression of memory problems. If this is the case, then we may need to adjust level of care according to patient needs. Support, both to the patient and caregiver, should then be put into place.

## 2020-01-26 NOTE — Progress Notes (Signed)
Asking about taken an iron pill, pt stated she is cold all the time, no energy

## 2020-01-26 NOTE — Progress Notes (Signed)
NEUROLOGY FOLLOW UP OFFICE NOTE  [name] [id number] 04-Mar-1941  HISTORY OF PRESENT ILLNESS: I had the pleasure of seeing [name] in follow-up in the neurology clinic on 01/26/2020.  The patient was last seen 7 months ago for dementia. She is again accompanied by her daughter [name] who helps supplement the history today.  Records and images were personally reviewed where available.  I personally reviewed MRI brain without contrast done 07/2019 which did not show any acute changes. There was mild diffuse atrophy, minimal chronic microvascular disease. TSH and B12 normal. Her SLUMS score in 06/2019 was 13/30. She was started on Donepezil 10mg  daily which she is tolerating without side effects. She states her memory is fine. [name] reports she is doing pretty good, better than last time. [name] manages finances and medications, also calling three times a day to ensure she is taking them. She takes hydroxyzine for anxiety around twice a day as needed. She does not drive. She is independent with dressing and bathing. [name] reports she "eats like a bird." No paranoia or hallucinations. Sleep is good, at times having a harder time getting to sleep. BP today is low 89/54, she is asymptomatic. She denies any headaches, dizziness, vision changes, focal numbness/tingling/weakness, no falls. They are awaiting new PCP appointment.    History on Initial Assessment 07/04/2019: This is a 79 year old right-handed woman with a history of hypertension, hyperlipidemia, uterine prolapse, presenting for evaluation of memory loss. She feels her memory "sometimes comes and goes, especially when doing something." Her daughter 73 is present during the visit and reports memory changes worsened around 6 months ago. [name] has lived with her since 2005. Around 6 months ago, [name] started noticing that she was not taking her medications correctly, the pharmacy notified her that something was going on, she was refilling  medications too early. [name] started managing medications at that time, and also started taking over with finances because she would forget her zip code or information on cashier checks. The patient reports that she would write a check and get nervous, she would miss one number. She stopped driving 3 months ago, family have not let her drive due to the pandemic, but also have been concerned about accidents. She denies getting lost driving. She misplaces things frequently. She stopped cooking 2-3 months ago when she put a pot in the microwave. She is able to bathe and dress herself, but [name] had noted some hygiene issues with the uterine prolapse, she was not washing fully and there would be a smell. One time she had different clothes on with a sock on her head, ready to go somewhere. She is able to do the laundry and dishes but has a harder time following instructions. [name] has noticed personality changes, she is more irritable and defiant, when upset talks very loudly. There may be some hallucinations/delusions, she would say there is something in the couch and she feels it when sitting on it. Her 2 sisters have dementia. No history of significant head injuries or alcohol use.   She has occasional numbness in her right thumb. Her right heel hurts and feels numb. She has constipation and urinary incontinence, catheter recently taken out after procedure for uterine prolapse. She has rare hand tremors when upset. She denies any headaches, dizziness, vision changes, neck/back pain, anosmia. Sleep is good. No falls.   Laboratory Data: Lab Results  Component Value Date   TSH 0.76 07/04/2019   Lab Results  Component Value Date  [id number] 07/04/2019      PAST MEDICAL HISTORY: Past Medical History:  Diagnosis Date  . Anemia    on iron  . Anxiety   . Arthritis    shoulders, knees, hands, hips; status post left knee surgery  . Chronic bronchitis (HCC)    CXR 07/08/16  . Depression   . GERD  (gastroesophageal reflux disease)    TAKES TUMS  . H/O: pneumonia 2003  . Hyperlipidemia    on med  . Hypertension   . Morbid obesity with BMI of 45.0-49.9, adult (HCC)   . Shingles    recent 02/2017  . Thoracic aortic atherosclerosis (HCC)    CXR 07/08/16    MEDICATIONS: Current Outpatient Medications on File Prior to Visit  Medication Sig Dispense Refill  . aspirin 81 MG chewable tablet Chew 81 mg by mouth daily.    Marland Kitchen donepezil (ARICEPT) 10 MG tablet Take 1/2 tablet daily for 2 weeks, then increase to 1 tablet daily 30 tablet 11  . hydrOXYzine (ATARAX/VISTARIL) 25 MG tablet Take 1 tablet (25 mg total) by mouth 3 (three) times daily as needed. 90 tablet 0  . lisinopril (ZESTRIL) 10 MG tablet Take 1 tablet (10 mg total) by mouth daily. 90 tablet 1  . polyethylene glycol powder (GLYCOLAX/MIRALAX) 17 GM/SCOOP powder Mix 1 capful of powder in drink and take by mouth one to three times daily as needed for daily soft stools  OTC (Patient taking differently: Take 0.5 Containers by mouth daily as needed for mild constipation. ) 225 g 0  . rosuvastatin (CRESTOR) 40 MG tablet Take 1 tablet (40 mg total) by mouth daily. 90 tablet 1  . trimethoprim (TRIMPEX) 100 MG tablet Take 1 tablet (100 mg total) by mouth daily. 30 tablet 0   No current facility-administered medications on file prior to visit.    ALLERGIES: Allergies  Allergen Reactions  . Effexor [Venlafaxine Hcl] Swelling and Other (See Comments)    MD ADVISES PATIENT TO NOT TAKE IN FUTURE PT STOPPED, MD DC'd MED  . Sulfa Drugs Cross Reactors Itching    FAMILY HISTORY: Family History  Problem Relation Age of Onset  . Heart disease Mother   . Heart disease Father   . Heart disease Sister   . Hypertension Sister   . Heart disease Sister   . Hypertension Sister   . Heart disease Sister   . Hypertension Sister     SOCIAL HISTORY: Social History   Socioeconomic History  . Marital status: Widowed    Spouse name: Not on  file  . Number of children: 7  . Years of education: Not on file  . Highest education level: Not on file  Occupational History  . Occupation: retired    Associate Professor: RETIRED  Tobacco Use  . Smoking status: Former Smoker    Packs/day: 1.00    Years: 25.00    Pack years: 25.00    Types: Cigarettes    Quit date: 03/07/1984    Years since quitting: 35.9  . Smokeless tobacco: Former Clinical biochemist  . Vaping Use: Unknown  Substance and Sexual Activity  . Alcohol use: No  . Drug use: No  . Sexual activity: Not Currently    Birth control/protection: Surgical  Other Topics Concern  . Not on file  Social History Narrative   Marital status:   She is a widowed since 2009; married 45 years      Children:   mother of 42, grandmother of 5.  Lives: alone with home; daughter, son lives with patient      Tobacco: quit smoking 34 years ago      Alcohol: none      ADLs:  Walks with cane; drives.     She does not drink alcohol or smoke cigarettes.   Right handed    Social Determinants of Health   Financial Resource Strain:   . Difficulty of Paying Living Expenses: Not on file  Food Insecurity:   . Worried About Programme researcher, broadcasting/film/video in the Last Year: Not on file  . Ran Out of Food in the Last Year: Not on file  Transportation Needs:   . Lack of Transportation (Medical): Not on file  . Lack of Transportation (Non-Medical): Not on file  Physical Activity:   . Days of Exercise per Week: Not on file  . Minutes of Exercise per Session: Not on file  Stress:   . Feeling of Stress : Not on file  Social Connections:   . Frequency of Communication with Friends and Family: Not on file  . Frequency of Social Gatherings with Friends and Family: Not on file  . Attends Religious Services: Not on file  . Active Member of Clubs or Organizations: Not on file  . Attends Banker Meetings: Not on file  . Marital Status: Not on file  Intimate Partner Violence:   . Fear of Current or  Ex-Partner: Not on file  . Emotionally Abused: Not on file  . Physically Abused: Not on file  . Sexually Abused: Not on file     PHYSICAL EXAM: Vitals:   01/26/20 1558  BP: (!) 89/54  Pulse: 80  SpO2: 96%   General: No acute distress, initially patient had poor eye contact Head:  Normocephalic/atraumatic Skin/Extremities: No rash, no edema Neurological Exam: alert and oriented to person, place, month/year. No aphasia or dysarthria. Fund of knowledge is reduced. Recent and remote memory are impaired.  Attention and concentration are reduced. MMSE 23/30 MMSE - Mini Mental State Exam 01/26/2020  Orientation to time 3  Orientation to Place 4  Registration 3  Attention/ Calculation 3  Recall 2  Language- name 2 objects 2  Language- repeat 1  Language- follow 3 step command 3  Language- read & follow direction 1  Write a sentence 1  Copy design 0  Total score 23   Cranial nerves: Pupils equal, round. Extraocular movements intact with no nystagmus. Visual fields full.  No facial asymmetry.  Motor: cogwheeling bilaterally. Muscle strength 5/5 throughout with no pronator drift.   Finger to nose testing intact.  Gait slow and cautious with cane.    IMPRESSION: This is a 79 yo RH woman with a history of hypertension, hyperlipidemia, uterine prolapse, with mild dementia. MMSE today 23/30. MRI brain showed mild diffuse atrophy, minimal chronic microvascular disease. She is on Donepezil 10mg  daily without side effects. She is awaiting new PCP appointment at the beginning of the year, in the meantime we will order safety labs due to report of decreased appetite, hypotension. Check CBC,CMP, iron studies for possible anemia. One time refills for hydroxyzine and rosuvastatin will be sent until she sees her new PCP. Continue close supervision. Follow-up in 6-8 months, they know to call for any changes.   Thank you for allowing me to participate in her care.  Please do not hesitate to call for any  questions or concerns.   , M.D.   CC: Dr. Patrcia Dolly

## 2020-01-30 ENCOUNTER — Other Ambulatory Visit: Payer: Self-pay

## 2020-01-30 ENCOUNTER — Other Ambulatory Visit (INDEPENDENT_AMBULATORY_CARE_PROVIDER_SITE_OTHER): Payer: MEDICARE

## 2020-01-30 DIAGNOSIS — E785 Hyperlipidemia, unspecified: Secondary | ICD-10-CM | POA: Diagnosis not present

## 2020-01-30 DIAGNOSIS — F0390 Unspecified dementia without behavioral disturbance: Secondary | ICD-10-CM | POA: Diagnosis not present

## 2020-01-30 DIAGNOSIS — F03A0 Unspecified dementia, mild, without behavioral disturbance, psychotic disturbance, mood disturbance, and anxiety: Secondary | ICD-10-CM

## 2020-01-30 LAB — CBC
HCT: 34.8 % — ABNORMAL LOW (ref 36.0–46.0)
Hemoglobin: 11.4 g/dL — ABNORMAL LOW (ref 12.0–15.0)
MCHC: 32.6 g/dL (ref 30.0–36.0)
MCV: 88.6 fl (ref 78.0–100.0)
Platelets: 250 10*3/uL (ref 150.0–400.0)
RBC: 3.93 Mil/uL (ref 3.87–5.11)
RDW: 16.3 % — ABNORMAL HIGH (ref 11.5–15.5)
WBC: 6 10*3/uL (ref 4.0–10.5)

## 2020-01-30 LAB — COMPREHENSIVE METABOLIC PANEL
ALT: 10 U/L (ref 0–35)
AST: 24 U/L (ref 0–37)
Albumin: 4.3 g/dL (ref 3.5–5.2)
Alkaline Phosphatase: 60 U/L (ref 39–117)
BUN: 27 mg/dL — ABNORMAL HIGH (ref 6–23)
CO2: 29 mEq/L (ref 19–32)
Calcium: 10.2 mg/dL (ref 8.4–10.5)
Chloride: 104 mEq/L (ref 96–112)
Creatinine, Ser: 2.1 mg/dL — ABNORMAL HIGH (ref 0.40–1.20)
GFR: 22.08 mL/min — ABNORMAL LOW (ref >60.00)
Glucose, Bld: 108 mg/dL — ABNORMAL HIGH (ref 70–99)
Potassium: 3.7 mEq/L (ref 3.5–5.1)
Sodium: 141 mEq/L (ref 135–145)
Total Bilirubin: 0.5 mg/dL (ref 0.2–1.2)
Total Protein: 7.7 g/dL (ref 6.0–8.3)

## 2020-01-31 ENCOUNTER — Telehealth: Payer: Self-pay | Admitting: Neurology

## 2020-01-31 LAB — IRON,TIBC AND FERRITIN PANEL
%SAT: 20 % (calc) (ref 16–45)
Ferritin: 290 ng/mL — ABNORMAL HIGH (ref 16–288)
Iron: 56 ug/dL (ref 45–160)
TIBC: 286 mcg/dL (calc) (ref 250–450)

## 2020-01-31 NOTE — Telephone Encounter (Signed)
Patient's daughter needs a recommendation for her mom's skin. "She has not been bathing properly," per her daughter. She'd like a prescription or an over the counter soap to help with gentle clensing and exfoliating."  [facility] [place]

## 2020-01-31 NOTE — Telephone Encounter (Signed)
Patient notified

## 2020-01-31 NOTE — Telephone Encounter (Signed)
Any suggestions>?

## 2020-01-31 NOTE — Telephone Encounter (Signed)
Pls let her know this is not something I can recommend on, this is something she should discuss with her upcoming PCP visit. Thanks

## 2020-02-01 ENCOUNTER — Telehealth: Payer: Self-pay | Admitting: Neurology

## 2020-02-01 DIAGNOSIS — Z23 Encounter for immunization: Secondary | ICD-10-CM | POA: Diagnosis not present

## 2020-02-01 NOTE — Telephone Encounter (Signed)
Patient's daughter called in and left a message wanting to get lab results for the patient.

## 2020-02-02 NOTE — Telephone Encounter (Signed)
Pt called

## 2020-02-02 NOTE — Telephone Encounter (Signed)
Neysa Bonito called her about results on 10/21 (see Result Note). Copied here: Pls let dtr know the bloodwork shows slight worsening of kidney function, please have her mother increase water intake, but also have her call again PCP office and see if they can move up her appointment. Thanks

## 2020-02-10 ENCOUNTER — Emergency Department (HOSPITAL_COMMUNITY): Payer: MEDICARE

## 2020-02-10 ENCOUNTER — Encounter (HOSPITAL_COMMUNITY): Payer: Self-pay | Admitting: Emergency Medicine

## 2020-02-10 ENCOUNTER — Emergency Department (HOSPITAL_COMMUNITY)
Admission: EM | Admit: 2020-02-10 | Discharge: 2020-02-10 | Disposition: A | Discharge status: Home / Self Care | Payer: MEDICARE | Attending: Emergency Medicine | Admitting: Emergency Medicine

## 2020-02-10 ENCOUNTER — Other Ambulatory Visit: Payer: Self-pay

## 2020-02-10 DIAGNOSIS — I10 Essential (primary) hypertension: Secondary | ICD-10-CM | POA: Diagnosis not present

## 2020-02-10 DIAGNOSIS — Z87891 Personal history of nicotine dependence: Secondary | ICD-10-CM | POA: Insufficient documentation

## 2020-02-10 DIAGNOSIS — S0990XA Unspecified injury of head, initial encounter: Secondary | ICD-10-CM | POA: Diagnosis not present

## 2020-02-10 DIAGNOSIS — R55 Syncope and collapse: Secondary | ICD-10-CM | POA: Diagnosis not present

## 2020-02-10 DIAGNOSIS — S199XXA Unspecified injury of neck, initial encounter: Secondary | ICD-10-CM | POA: Diagnosis not present

## 2020-02-10 DIAGNOSIS — I739 Peripheral vascular disease, unspecified: Secondary | ICD-10-CM | POA: Diagnosis not present

## 2020-02-10 DIAGNOSIS — R52 Pain, unspecified: Secondary | ICD-10-CM | POA: Diagnosis not present

## 2020-02-10 DIAGNOSIS — Z79899 Other long term (current) drug therapy: Secondary | ICD-10-CM | POA: Diagnosis not present

## 2020-02-10 DIAGNOSIS — R42 Dizziness and giddiness: Secondary | ICD-10-CM | POA: Diagnosis not present

## 2020-02-10 DIAGNOSIS — M4322 Fusion of spine, cervical region: Secondary | ICD-10-CM | POA: Diagnosis not present

## 2020-02-10 DIAGNOSIS — Z7982 Long term (current) use of aspirin: Secondary | ICD-10-CM | POA: Insufficient documentation

## 2020-02-10 DIAGNOSIS — I6523 Occlusion and stenosis of bilateral carotid arteries: Secondary | ICD-10-CM | POA: Diagnosis not present

## 2020-02-10 DIAGNOSIS — N3090 Cystitis, unspecified without hematuria: Secondary | ICD-10-CM

## 2020-02-10 DIAGNOSIS — R404 Transient alteration of awareness: Secondary | ICD-10-CM | POA: Diagnosis not present

## 2020-02-10 DIAGNOSIS — M47812 Spondylosis without myelopathy or radiculopathy, cervical region: Secondary | ICD-10-CM | POA: Diagnosis not present

## 2020-02-10 DIAGNOSIS — R0902 Hypoxemia: Secondary | ICD-10-CM | POA: Diagnosis not present

## 2020-02-10 LAB — URINALYSIS, ROUTINE W REFLEX MICROSCOPIC
Bilirubin Urine: NEGATIVE
Glucose, UA: NEGATIVE mg/dL
Ketones, ur: 5 mg/dL — AB
Nitrite: NEGATIVE
Protein, ur: 30 mg/dL — AB
Specific Gravity, Urine: 1.008 (ref 1.005–1.030)
WBC, UA: >50 WBC/hpf — ABNORMAL HIGH (ref 0–5)
pH: 6 (ref 5.0–8.0)

## 2020-02-10 LAB — CBC
HCT: 37.7 % (ref 36.0–46.0)
Hemoglobin: 11.6 g/dL — ABNORMAL LOW (ref 12.0–15.0)
MCH: 28.2 pg (ref 26.0–34.0)
MCHC: 30.8 g/dL (ref 30.0–36.0)
MCV: 91.7 fL (ref 80.0–100.0)
Platelets: 261 10*3/uL (ref 150–400)
RBC: 4.11 MIL/uL (ref 3.87–5.11)
RDW: 16 % — ABNORMAL HIGH (ref 11.5–15.5)
WBC: 6.1 10*3/uL (ref 4.0–10.5)
nRBC: 0 % (ref 0.0–0.2)

## 2020-02-10 LAB — BASIC METABOLIC PANEL
Anion gap: 11 (ref 5–15)
BUN: 27 mg/dL — ABNORMAL HIGH (ref 8–23)
CO2: 23 mmol/L (ref 22–32)
Calcium: 10.1 mg/dL (ref 8.9–10.3)
Chloride: 105 mmol/L (ref 98–111)
Creatinine, Ser: 1.97 mg/dL — ABNORMAL HIGH (ref 0.44–1.00)
GFR, Estimated: 26 mL/min — ABNORMAL LOW (ref >60)
Glucose, Bld: 130 mg/dL — ABNORMAL HIGH (ref 70–99)
Potassium: 4.2 mmol/L (ref 3.5–5.1)
Sodium: 139 mmol/L (ref 135–145)

## 2020-02-10 MED ORDER — CEPHALEXIN 250 MG PO CAPS
500.0000 mg | ORAL_CAPSULE | Freq: Once | ORAL | Status: AC
  Administered 2020-02-10: 500 mg via ORAL
  Filled 2020-02-10: qty 2

## 2020-02-10 MED ORDER — SODIUM CHLORIDE 0.9 % IV BOLUS
500.0000 mL | Freq: Once | INTRAVENOUS | Status: AC
  Administered 2020-02-10: 500 mL via INTRAVENOUS

## 2020-02-10 MED ORDER — CEPHALEXIN 500 MG PO CAPS: 500.0000 mg | ORAL_CAPSULE | Freq: Two times a day (BID) | ORAL | 0 refills | Status: AC

## 2020-02-10 NOTE — ED Provider Notes (Signed)
[facility] EMERGENCY DEPARTMENT Provider Note   CSN: [id number] Arrival date & time: 02/10/20  1255     History Chief Complaint  Patient presents with  . Loss of Consciousness    [name] is a 79 y.o. female.  79 year old female with prior medical history as detailed below presents for evaluation of near syncopal event.  Patient was at home with her family.  She was in the kitchen.  Patient reportedly had just taken her medications.  She felt a wave of nausea.  She then proceeded to have a brief syncopal episode.  Family caught her before she had significant impact with the floor.  Patient did strike the back of her head.  She was minimally responsive for approximately 30 seconds.  She is now returned to baseline.  She is without current complaint.  She denies associated chest pain or shortness of breath.  The history is provided by the patient and medical records.  Loss of Consciousness Episode history:  Single Most recent episode:  Today Duration:  30 seconds Timing:  Unable to specify Progression:  Resolved Chronicity:  New Witnessed: yes   Relieved by:  Nothing Worsened by:  Nothing      Past Medical History:  Diagnosis Date  . Anemia    on iron  . Anxiety   . Arthritis    shoulders, knees, hands, hips; status post left knee surgery  . Chronic bronchitis (HCC)    CXR 07/08/16  . Depression   . GERD (gastroesophageal reflux disease)    TAKES TUMS  . H/O: pneumonia 2003  . Hyperlipidemia    on med  . Hypertension   . Morbid obesity with BMI of 45.0-49.9, adult (HCC)   . Shingles    recent 02/2017  . Thoracic aortic atherosclerosis (HCC)    CXR 07/08/16    Patient Active Problem List   Diagnosis Date Noted  . Midline cystocele 05/03/2019  . OAB (overactive bladder) 05/03/2019  . Urinary retention 05/03/2019  . Uterine procidentia 05/03/2019  . Chest pain, negative stress test. non cardiac. 06/16/2018  . Primary osteoarthritis of  left hip 01/20/2017  . Primary osteoarthritis of right hip 11/10/2015  . Anxiety state 11/10/2015  . Preop cardiovascular exam 07/23/2014  . Bilateral edema of lower extremity 07/18/2013    Class: Chronic  . Metabolic syndrome 07/18/2013  . Rectocele 07/07/2012  . Essential hypertension 07/07/2012  . GERD (gastroesophageal reflux disease) 07/07/2012  . Hyperlipidemia with target LDL less than 100 07/07/2012  . Rotator cuff tear, left 03/02/2012  . Osteoarthritis of left shoulder 01/21/2012    Past Surgical History:  Procedure Laterality Date  . CARDIAC CATHETERIZATION  04/20/1996   patent coronaries, EF 74% (Dr. Aram Candela)  . CARPAL TUNNEL RELEASE Left   . CATARACT EXTRACTION, BILATERAL    . COLONOSCOPY    . KNEE SURGERY Left 11/2001  . NM MYOVIEW LTD  06/17/2018   EF 60-65 %.  Apical thinning.  LOW RISK no ischemia or infarction.  . REPLACEMENT TOTAL KNEE Left 10/2009  . REPLACEMENT TOTAL KNEE Left 10/2009  . SHOULDER OPEN ROTATOR CUFF REPAIR  03/01/2012   Procedure: ROTATOR CUFF REPAIR SHOULDER OPEN;  Surgeon: Mable Paris, MD;  Location: [facility] OR;  Service: Orthopedics;  Laterality: Left;  SUBSCAPULARIS REPAIR  . SVD     x 7  . TOTAL HIP ARTHROPLASTY Right 07/24/2016  . TOTAL HIP ARTHROPLASTY Right 07/24/2016   Procedure: RIGHT TOTAL HIP ARTHROPLASTY ANTERIOR APPROACH;  Surgeon: [name], MD;  Location: [facility] OR;  Service: Orthopedics;  Laterality: Right;  . TOTAL HIP ARTHROPLASTY Left 04/09/2017   Procedure: LEFT TOTAL HIP ARTHROPLASTY ANTERIOR APPROACH;  Surgeon: [name], MD;  Location: [facility] ORS;  Service: Orthopedics;  Laterality: Left;  . TOTAL SHOULDER ARTHROPLASTY  01/18/2012   Procedure: TOTAL SHOULDER ARTHROPLASTY;  Surgeon: Mable Paris, MD;  Location: [facility] OR;  Service: Orthopedics;  Laterality: Left;  . TOTAL SHOULDER REPLACEMENT Left 01/19/2012  . TRANSTHORACIC ECHOCARDIOGRAM  06/16/2018   EF 60 to 65%.  GR 1 DD.  Normal mitral valve.  Mild aortic  sclerosis but no stenosis.  . TUBAL LIGATION    . WISDOM TOOTH EXTRACTION       OB History   No obstetric history on file.     Family History  Problem Relation Age of Onset  . Heart disease Mother   . Heart disease Father   . Heart disease Sister   . Hypertension Sister   . Heart disease Sister   . Hypertension Sister   . Heart disease Sister   . Hypertension Sister     Social History   Tobacco Use  . Smoking status: Former Smoker    Packs/day: 1.00    Years: 25.00    Pack years: 25.00    Types: Cigarettes    Quit date: 03/07/1984    Years since quitting: 35.9  . Smokeless tobacco: Former Clinical biochemist  . Vaping Use: Never used  Substance Use Topics  . Alcohol use: No  . Drug use: No    Home Medications Prior to Admission medications   Medication Sig Start Date End Date Taking? Authorizing Provider  aspirin 81 MG chewable tablet Chew 81 mg by mouth daily.    [provider]  donepezil (ARICEPT) 10 MG tablet Take 1 tablet daily 01/26/20   [name], MD  hydrOXYzine (ATARAX/VISTARIL) 25 MG tablet Take 1 tablet (25 mg total) by mouth 3 (three) times daily as needed. 01/26/20   [name], MD  lisinopril (ZESTRIL) 10 MG tablet Take 1 tablet (10 mg total) by mouth daily. 12/14/19   [name], MD  Multiple Vitamins-Minerals (MULTI-VITAMIN GUMMIES PO) Take by mouth.    [provider]  polyethylene glycol powder (GLYCOLAX/MIRALAX) 17 GM/SCOOP powder Mix 1 capful of powder in drink and take by mouth one to three times daily as needed for daily soft stools  OTC Patient taking differently: Take 0.5 Containers by mouth daily as needed for mild constipation.  12/13/18   Little, Ambrose Finland, MD  rosuvastatin (CRESTOR) 40 MG tablet Take 1 tablet (40 mg total) by mouth daily. 01/26/20   [name], MD  trimethoprim (TRIMPEX) 100 MG tablet Take 1 tablet (100 mg total) by mouth daily. 12/14/19   [name], MD    Allergies      Effexor [venlafaxine hcl] and Sulfa drugs cross reactors  Review of Systems   Review of Systems  Cardiovascular: Positive for syncope.  All other systems reviewed and are negative.   Physical Exam Updated Vital Signs There were no vitals taken for this visit.  Physical Exam Vitals and nursing note reviewed.  Constitutional:      General: She is not in acute distress.    Appearance: Normal appearance. She is well-developed.  HENT:     Head: Normocephalic and atraumatic.  Eyes:     Conjunctiva/sclera: Conjunctivae normal.     Pupils: Pupils are equal, round,  and reactive to light.  Cardiovascular:     Rate and Rhythm: Normal rate and regular rhythm.     Heart sounds: Normal heart sounds.  Pulmonary:     Effort: Pulmonary effort is normal. No respiratory distress.     Breath sounds: Normal breath sounds.  Abdominal:     General: There is no distension.     Palpations: Abdomen is soft.     Tenderness: There is no abdominal tenderness.  Musculoskeletal:        General: No deformity. Normal range of motion.     Cervical back: Normal range of motion and neck supple.  Skin:    General: Skin is warm and dry.  Neurological:     Mental Status: She is alert and oriented to person, place, and time.     ED Results / Procedures / Treatments   Labs (all labs ordered are listed, but only abnormal results are displayed) Labs Reviewed  BASIC METABOLIC PANEL - Abnormal; Notable for the following components:      Result Value   Glucose, Bld 130 (*)    BUN 27 (*)    Creatinine, Ser 1.97 (*)    GFR, Estimated 26 (*)    All other components within normal limits  CBC - Abnormal; Notable for the following components:   Hemoglobin 11.6 (*)    RDW 16.0 (*)    All other components within normal limits  URINALYSIS, ROUTINE W REFLEX MICROSCOPIC  CBG MONITORING, ED    EKG EKG Interpretation  Date/Time:  Saturday February 10 2020 12:59:07 EDT Ventricular Rate:  76 PR  Interval:  164 QRS Duration: 90 QT Interval:  420 QTC Calculation: 472 R Axis:   48 Text Interpretation: Normal sinus rhythm Low voltage QRS Nonspecific T wave abnormality Prolonged QT Abnormal ECG Confirmed by Kristine RoyalMessick, Shaquan Missey [phone]([id number]) on 02/10/2020 1:40:08 PM   Radiology No results found.  Procedures Procedures (including critical care time)  Medications Ordered in ED Medications  sodium chloride 0.9 % bolus 500 mL (has no administration in time range)    ED Course  I have reviewed the triage vital signs and the nursing notes.  Pertinent labs & imaging results that were available during my care of the patient were reviewed by me and considered in my medical decision making (see chart for details).    MDM Rules/Calculators/A&P                          MDM  Screen complete  [name] was evaluated in Emergency Department on 02/10/2020 for the symptoms described in the history of present illness. She was evaluated in the context of the global COVID-19 pandemic, which necessitated consideration that the patient might be at risk for infection with the SARS-CoV-2 virus that causes COVID-19. Institutional protocols and algorithms that pertain to the evaluation of patients at risk for COVID-19 are in a state of rapid change based on information released by regulatory bodies including the CDC and federal and state organizations. These policies and algorithms were followed during the patient's care in the ED.  Patient is presenting for evaluation of reported syncopal versus near syncopal event.  Symptoms may have been preceded by a wave of nausea after taking medications.  Patient is currently without significant complaint.  CT imaging did not reveal evidence of significant injury related to her fall.  Work-up thus far is without significant abnormality.  UA is pending. Dr Renaye Rakersrifan (EDP) is aware of  pending UA and likely DC home.   Final Clinical Impression(s) / ED  Diagnoses Final diagnoses:  Near syncope    Rx / DC Orders ED Discharge Orders    None       Wynetta Fines, MD 02/10/20 1537

## 2020-02-10 NOTE — ED Notes (Signed)
Pt back from CT

## 2020-02-10 NOTE — Discharge Instructions (Addendum)
Please return for any problem.   Follow up with your regular care provider as instructed.   Your test showed a likely urine infection.  We gave you an antibiotic in the ER today.  Your next dose is due tomorrow morning on 02/11/20.  Please fill the prescription at a local pharmacy.

## 2020-02-10 NOTE — ED Triage Notes (Signed)
Pt to triage via [facility] from home.  She was standing in kitchen and felt hot and dizzy and had syncopal episode hitting head.  No blood thinners.  Family reported brief LOC.  Pt states she remembers everything.  C-collar in place by EMS.  Reports pain to back of head.

## 2020-02-10 NOTE — ED Provider Notes (Signed)
Pt signed out to me by Dr Rodena Medin.  Briefly 79 yo female presenting to the ED with near syncope at home while in her kitchen.  Suspected vasovagal episode. Small head trauma while falling.  CTH and C spine negative for acute injuries.  Labs unremarkable.  Awaiting UA.  Pt back to baseline mental state.  She wishes to go home if possible today.    UA reviewed - likely UTI.  Will give keflex here and start on 5 day course at home.  Okay for discharge   Terald Sleeper, MD 02/10/20 1843

## 2020-02-10 NOTE — ED Notes (Signed)
Pt voided on herself and was not able to provide a urine sample at this time, Place [place] on pt will attempt again

## 2020-02-10 NOTE — ED Notes (Signed)
Pt d/c by MD and is provided w/ d.c instructions and follow up care, Pt is out of the ED in wheel chair accompany with family

## 2020-02-12 ENCOUNTER — Telehealth: Payer: Self-pay | Admitting: Cardiology

## 2020-02-12 NOTE — Telephone Encounter (Signed)
Pt c/o medication issue:  1. Name of Medication: lisinopril (ZESTRIL) 10 MG tablet  2. How are you currently taking this medication (dosage and times per day)? Pt did not take the medication yesterday (02/11/20)  3. Are you having a reaction (difficulty breathing--STAT)? no  4. What is your medication issue? Pt had EMS called to her house because she passed out on Saturday. When the pt got to the ER they said her BP ws low so they advised her to hold her BP medication on Sunday. The Daughter did what the ER Doctor advised but wanted to know what to do now as it is Monday

## 2020-02-12 NOTE — Telephone Encounter (Signed)
Spoke with pt's daughter Carroll Sage (okay per DPR). Daughter wanted to know when it was safe to resume administration of lisinopril, which had been held over the weekend per the ER physician who attended her mother after a syncopal episode and discovery of UTI. Daughter states she is planning to purchase BP cuff for home use today. Daughter says mother is not currently experiencing any lightheadedness, dizziness or lethargy. Daughter also states she has been encouraging hydration. Advised daughter that in the absence of hypotensive symptoms, it was all right to resume medication regimen, but that she was welcome to wait until she had a reading per the home cuff. Advised that systolic greater than 110 was a safe threshold for administration of BP medication. Advised daughter to let us know if she has any concerns about pt's blood pressure once she gets a reading with home cuff. Daughter verbalizes understanding.

## 2020-02-13 ENCOUNTER — Encounter: Payer: Self-pay | Admitting: Neurology

## 2020-02-13 ENCOUNTER — Ambulatory Visit: Payer: Self-pay

## 2020-02-13 LAB — URINE CULTURE: Culture: >=100000 — AB

## 2020-02-13 NOTE — Telephone Encounter (Signed)
Called pt's daughter back regarding administration of BP med.  Read note written 02/12/20 by Georgia Duff Apple RN (nurse at cardiologists office):  Spoke with pt's daughter Carroll Sage (okay per DPR). Daughter wanted to know when it was safe to resume administration of lisinopril, which had been held over the weekend per the ER physician who attended her mother after a syncopal episode and discovery of UTI. Daughter states she is planning to purchase BP cuff for home use today. Daughter says mother is not currently experiencing any lightheadedness, dizziness or lethargy. Daughter also states she has been encouraging hydration. Advised daughter that in the absence of hypotensive symptoms, it was all right to resume medication regimen, but that she was welcome to wait until she had a reading per the home cuff. Advised that systolic greater than 110 was a safe threshold for administration of BP medication. Advised daughter to let us know if she has any concerns about pt's blood pressure once she gets a reading with home cuff. Daughter verbalizes understanding.  Daughter verbalized understanding.   Reason for Disposition . Health Information question, no triage required and triager able to answer question  Answer Assessment - Initial Assessment Questions 1. REASON FOR CALL or QUESTION: "What is your reason for calling today?" or "How can I best help you?" or "What question do you have that I can help answer?"     Daughter called wanting to know if ok to restart BP med.  Protocols used: INFORMATION ONLY CALL - NO TRIAGE-A-AH

## 2020-02-13 NOTE — Telephone Encounter (Signed)
Pt's daughter CB after checking pt's BP, 100/72. Advised to hold as instructed in earlier encounter. Continue to hydrate well. Will check again this evening. Advised on best times, frequency to check BP, verbalizes understanding.  Advised to [facility] for any additional questions.

## 2020-02-15 ENCOUNTER — Telehealth: Payer: Self-pay | Admitting: *Deleted

## 2020-02-15 NOTE — Telephone Encounter (Signed)
Post ED Visit - Positive Culture Follow-up  Culture report reviewed by antimicrobial stewardship pharmacist: [facility] []  , Pharm.D. []  Enzo Bi, Pharm.D., BCPS AQ-ID []  , Pharm.D., BCPS []  Celedonio Miyamoto, Pharm.D., BCPS []  [place], Garvin Fila.D., BCPS, AAHIVP []  , Pharm.D., BCPS, AAHIVP []  Georgina Pillion, PharmD, BCPS []  , PharmD, BCPS []  [place], PharmD, BCPS []  [address], PharmD []  , PharmD, BCPS []  Estella Husk, PharmD  Pharmacy Team []  Lysle Pearl, PharmD []  , PharmD []  Phillips Climes, PharmD []  , Rph []  Agapito Games) , PharmD []  Verlan Friends, PharmD []  , PharmD []  Mervyn Gay, PharmD []  , PharmD []  Vinnie Level, PharmD []  [facility], PharmD []  , PharmD []  Len Childs, PharmD   Positive urine culture Treated with Cephalexin, organism sensitive to the same and no further patient follow-up is required at this time. , PharmD  Greer Pickerel Talley 02/15/2020, 10:49 AM

## 2020-02-23 ENCOUNTER — Ambulatory Visit: Payer: Self-pay

## 2020-02-23 DIAGNOSIS — M25552 Pain in left hip: Secondary | ICD-10-CM | POA: Diagnosis not present

## 2020-02-23 DIAGNOSIS — M25551 Pain in right hip: Secondary | ICD-10-CM | POA: Diagnosis not present

## 2020-02-23 DIAGNOSIS — M545 Low back pain, unspecified: Secondary | ICD-10-CM | POA: Diagnosis not present

## 2020-02-23 NOTE — Telephone Encounter (Signed)
Pt. States pt. Slid out of bed Wednesday night, onto her bottom. Unsure if she hit her head. No lumps noted to head. No bruising or cuts. Pt. Is sore and "moving slower than normal." No availability in the practice today.Daughter will take pt. To UC.  Answer Assessment - Initial Assessment Questions 1. MECHANISM: "How did the fall happen?"     Slid at bed 2. DOMESTIC VIOLENCE AND ELDER ABUSE SCREENING: "Did you fall because someone pushed you or tried to hurt you?" If Yes, ask: "Are you safe now?"     No 3. ONSET: "When did the fall happen?" (e.g., minutes, hours, or days ago)     Wed. 4. LOCATION: "What part of the body hit the ground?" (e.g., back, buttocks, head, hips, knees, hands, head, stomach)     Buttocks 5. INJURY: "Did you hurt (injure) yourself when you fell?" If Yes, ask: "What did you injure? Tell me more about this?" (e.g., body area; type of injury; pain severity)"     Unsure if hit head 6. PAIN: "Is there any pain?" If Yes, ask: "How bad is the pain?" (e.g., Scale 1-10; or mild,  moderate, severe)   - NONE (0): no pain   - MILD (1-3): doesn't interfere with normal activities    - MODERATE (4-7): interferes with normal activities or awakens from sleep    - SEVERE (8-10): excruciating pain, unable to do any normal activities      Buttocks and legs - Moderate 7. SIZE: For cuts, bruises, or swelling, ask: "How large is it?" (e.g., inches or centimeters)      No 8. PREGNANCY: "Is there any chance you are pregnant?" "When was your last menstrual period?"     No 9. OTHER SYMPTOMS: "Do you have any other symptoms?" (e.g., dizziness, fever, weakness; new onset or worsening).      Weaker 10. CAUSE: "What do you think caused the fall (or falling)?" (e.g., tripped, dizzy spell)       Slid out of bed  Protocols used: FALLS AND FALLING-A-AH

## 2020-02-26 ENCOUNTER — Emergency Department (HOSPITAL_COMMUNITY): Payer: MEDICARE

## 2020-02-26 ENCOUNTER — Ambulatory Visit: Payer: Self-pay

## 2020-02-26 ENCOUNTER — Inpatient Hospital Stay (HOSPITAL_COMMUNITY): Payer: MEDICARE

## 2020-02-26 ENCOUNTER — Inpatient Hospital Stay (HOSPITAL_COMMUNITY)
Admission: EM | Admit: 2020-02-26 | Discharge: 2020-03-03 | DRG: 315 | Disposition: A | Discharge status: Skilled Nursing Facility | Payer: MEDICARE | Attending: Internal Medicine | Admitting: Internal Medicine

## 2020-02-26 ENCOUNTER — Encounter (HOSPITAL_COMMUNITY): Payer: Self-pay

## 2020-02-26 DIAGNOSIS — N179 Acute kidney failure, unspecified: Secondary | ICD-10-CM | POA: Diagnosis present

## 2020-02-26 DIAGNOSIS — F419 Anxiety disorder, unspecified: Secondary | ICD-10-CM | POA: Diagnosis present

## 2020-02-26 DIAGNOSIS — B9620 Unspecified Escherichia coli [E. coli] as the cause of diseases classified elsewhere: Secondary | ICD-10-CM | POA: Diagnosis present

## 2020-02-26 DIAGNOSIS — Z96652 Presence of left artificial knee joint: Secondary | ICD-10-CM | POA: Diagnosis present

## 2020-02-26 DIAGNOSIS — Z20822 Contact with and (suspected) exposure to covid-19: Secondary | ICD-10-CM | POA: Diagnosis present

## 2020-02-26 DIAGNOSIS — N183 Chronic kidney disease, stage 3 unspecified: Secondary | ICD-10-CM | POA: Diagnosis present

## 2020-02-26 DIAGNOSIS — M6282 Rhabdomyolysis: Secondary | ICD-10-CM | POA: Diagnosis present

## 2020-02-26 DIAGNOSIS — M6258 Muscle wasting and atrophy, not elsewhere classified, other site: Secondary | ICD-10-CM | POA: Diagnosis not present

## 2020-02-26 DIAGNOSIS — R0902 Hypoxemia: Secondary | ICD-10-CM | POA: Diagnosis not present

## 2020-02-26 DIAGNOSIS — R41841 Cognitive communication deficit: Secondary | ICD-10-CM | POA: Diagnosis not present

## 2020-02-26 DIAGNOSIS — R296 Repeated falls: Secondary | ICD-10-CM | POA: Diagnosis present

## 2020-02-26 DIAGNOSIS — I213 ST elevation (STEMI) myocardial infarction of unspecified site: Secondary | ICD-10-CM | POA: Diagnosis not present

## 2020-02-26 DIAGNOSIS — R2681 Unsteadiness on feet: Secondary | ICD-10-CM | POA: Diagnosis not present

## 2020-02-26 DIAGNOSIS — Z96643 Presence of artificial hip joint, bilateral: Secondary | ICD-10-CM | POA: Diagnosis present

## 2020-02-26 DIAGNOSIS — Z87440 Personal history of urinary (tract) infections: Secondary | ICD-10-CM

## 2020-02-26 DIAGNOSIS — R2689 Other abnormalities of gait and mobility: Secondary | ICD-10-CM | POA: Diagnosis not present

## 2020-02-26 DIAGNOSIS — I959 Hypotension, unspecified: Secondary | ICD-10-CM | POA: Diagnosis not present

## 2020-02-26 DIAGNOSIS — N3001 Acute cystitis with hematuria: Secondary | ICD-10-CM | POA: Diagnosis present

## 2020-02-26 DIAGNOSIS — I10 Essential (primary) hypertension: Secondary | ICD-10-CM | POA: Diagnosis not present

## 2020-02-26 DIAGNOSIS — Z888 Allergy status to other drugs, medicaments and biological substances status: Secondary | ICD-10-CM

## 2020-02-26 DIAGNOSIS — I129 Hypertensive chronic kidney disease with stage 1 through stage 4 chronic kidney disease, or unspecified chronic kidney disease: Secondary | ICD-10-CM | POA: Diagnosis present

## 2020-02-26 DIAGNOSIS — M6281 Muscle weakness (generalized): Secondary | ICD-10-CM | POA: Diagnosis not present

## 2020-02-26 DIAGNOSIS — Z7401 Bed confinement status: Secondary | ICD-10-CM | POA: Diagnosis not present

## 2020-02-26 DIAGNOSIS — E785 Hyperlipidemia, unspecified: Secondary | ICD-10-CM | POA: Diagnosis present

## 2020-02-26 DIAGNOSIS — Z87891 Personal history of nicotine dependence: Secondary | ICD-10-CM

## 2020-02-26 DIAGNOSIS — R011 Cardiac murmur, unspecified: Secondary | ICD-10-CM | POA: Diagnosis not present

## 2020-02-26 DIAGNOSIS — G44209 Tension-type headache, unspecified, not intractable: Secondary | ICD-10-CM | POA: Diagnosis present

## 2020-02-26 DIAGNOSIS — Z043 Encounter for examination and observation following other accident: Secondary | ICD-10-CM | POA: Diagnosis not present

## 2020-02-26 DIAGNOSIS — Z96612 Presence of left artificial shoulder joint: Secondary | ICD-10-CM | POA: Diagnosis present

## 2020-02-26 DIAGNOSIS — R54 Age-related physical debility: Secondary | ICD-10-CM | POA: Diagnosis present

## 2020-02-26 DIAGNOSIS — Z7982 Long term (current) use of aspirin: Secondary | ICD-10-CM

## 2020-02-26 DIAGNOSIS — R5381 Other malaise: Secondary | ICD-10-CM | POA: Diagnosis not present

## 2020-02-26 DIAGNOSIS — R531 Weakness: Secondary | ICD-10-CM

## 2020-02-26 DIAGNOSIS — R600 Localized edema: Secondary | ICD-10-CM | POA: Diagnosis present

## 2020-02-26 DIAGNOSIS — Z79899 Other long term (current) drug therapy: Secondary | ICD-10-CM | POA: Diagnosis not present

## 2020-02-26 DIAGNOSIS — I517 Cardiomegaly: Secondary | ICD-10-CM | POA: Diagnosis not present

## 2020-02-26 DIAGNOSIS — F0390 Unspecified dementia without behavioral disturbance: Secondary | ICD-10-CM | POA: Diagnosis present

## 2020-02-26 DIAGNOSIS — M2550 Pain in unspecified joint: Secondary | ICD-10-CM | POA: Diagnosis not present

## 2020-02-26 DIAGNOSIS — Z8249 Family history of ischemic heart disease and other diseases of the circulatory system: Secondary | ICD-10-CM

## 2020-02-26 DIAGNOSIS — N3000 Acute cystitis without hematuria: Secondary | ICD-10-CM | POA: Diagnosis not present

## 2020-02-26 DIAGNOSIS — R1312 Dysphagia, oropharyngeal phase: Secondary | ICD-10-CM | POA: Diagnosis not present

## 2020-02-26 DIAGNOSIS — R278 Other lack of coordination: Secondary | ICD-10-CM | POA: Diagnosis not present

## 2020-02-26 DIAGNOSIS — I361 Nonrheumatic tricuspid (valve) insufficiency: Secondary | ICD-10-CM | POA: Diagnosis not present

## 2020-02-26 DIAGNOSIS — Z882 Allergy status to sulfonamides status: Secondary | ICD-10-CM

## 2020-02-26 DIAGNOSIS — R609 Edema, unspecified: Secondary | ICD-10-CM | POA: Diagnosis not present

## 2020-02-26 LAB — URINALYSIS, ROUTINE W REFLEX MICROSCOPIC
Bilirubin Urine: NEGATIVE
Glucose, UA: NEGATIVE mg/dL
Ketones, ur: 5 mg/dL — AB
Nitrite: NEGATIVE
Protein, ur: 100 mg/dL — AB
Specific Gravity, Urine: 1.017 (ref 1.005–1.030)
WBC, UA: >50 WBC/hpf — ABNORMAL HIGH (ref 0–5)
pH: 5 (ref 5.0–8.0)

## 2020-02-26 LAB — CBC WITH DIFFERENTIAL/PLATELET
Abs Immature Granulocytes: 0.05 10*3/uL (ref 0.00–0.07)
Basophils Absolute: 0 10*3/uL (ref 0.0–0.1)
Basophils Relative: 0 %
Eosinophils Absolute: 0 10*3/uL (ref 0.0–0.5)
Eosinophils Relative: 0 %
HCT: 33.9 % — ABNORMAL LOW (ref 36.0–46.0)
Hemoglobin: 10.4 g/dL — ABNORMAL LOW (ref 12.0–15.0)
Immature Granulocytes: 1 %
Lymphocytes Relative: 10 %
Lymphs Abs: 1 10*3/uL (ref 0.7–4.0)
MCH: 28.7 pg (ref 26.0–34.0)
MCHC: 30.7 g/dL (ref 30.0–36.0)
MCV: 93.4 fL (ref 80.0–100.0)
Monocytes Absolute: 0.8 10*3/uL (ref 0.1–1.0)
Monocytes Relative: 8 %
Neutro Abs: 8.2 10*3/uL — ABNORMAL HIGH (ref 1.7–7.7)
Neutrophils Relative %: 81 %
Platelets: 249 10*3/uL (ref 150–400)
RBC: 3.63 MIL/uL — ABNORMAL LOW (ref 3.87–5.11)
RDW: 16.2 % — ABNORMAL HIGH (ref 11.5–15.5)
WBC: 10.1 10*3/uL (ref 4.0–10.5)
nRBC: 0 % (ref 0.0–0.2)

## 2020-02-26 LAB — LACTIC ACID, PLASMA: Lactic Acid, Venous: 1.1 mmol/L (ref 0.5–1.9)

## 2020-02-26 LAB — COMPREHENSIVE METABOLIC PANEL
ALT: 42 U/L (ref 0–44)
AST: 76 U/L — ABNORMAL HIGH (ref 15–41)
Albumin: 3.7 g/dL (ref 3.5–5.0)
Alkaline Phosphatase: 63 U/L (ref 38–126)
Anion gap: 13 (ref 5–15)
BUN: 34 mg/dL — ABNORMAL HIGH (ref 8–23)
CO2: 24 mmol/L (ref 22–32)
Calcium: 10.2 mg/dL (ref 8.9–10.3)
Chloride: 101 mmol/L (ref 98–111)
Creatinine, Ser: 2.04 mg/dL — ABNORMAL HIGH (ref 0.44–1.00)
GFR, Estimated: 25 mL/min — ABNORMAL LOW (ref >60)
Glucose, Bld: 120 mg/dL — ABNORMAL HIGH (ref 70–99)
Potassium: 4.1 mmol/L (ref 3.5–5.1)
Sodium: 138 mmol/L (ref 135–145)
Total Bilirubin: 0.8 mg/dL (ref 0.3–1.2)
Total Protein: 7.1 g/dL (ref 6.5–8.1)

## 2020-02-26 LAB — TSH: TSH: 0.644 u[IU]/mL (ref 0.350–4.500)

## 2020-02-26 LAB — RESPIRATORY PANEL BY RT PCR (FLU A&B, COVID)
Influenza A by PCR: NEGATIVE
Influenza B by PCR: NEGATIVE
SARS Coronavirus 2 by RT PCR: NEGATIVE

## 2020-02-26 LAB — TROPONIN I (HIGH SENSITIVITY)
Troponin I (High Sensitivity): 46 ng/L — ABNORMAL HIGH (ref <18)
Troponin I (High Sensitivity): 45 ng/L — ABNORMAL HIGH (ref <18)

## 2020-02-26 LAB — CORTISOL: Cortisol, Plasma: 13.1 ug/dL

## 2020-02-26 MED ORDER — SODIUM CHLORIDE 0.9 % IV BOLUS
500.0000 mL | Freq: Once | INTRAVENOUS | Status: AC
  Administered 2020-02-26: 500 mL via INTRAVENOUS

## 2020-02-26 MED ORDER — HEPARIN SODIUM (PORCINE) 5000 UNIT/ML IJ SOLN
5000.0000 [IU] | Freq: Two times a day (BID) | INTRAMUSCULAR | Status: DC
  Administered 2020-02-26 – 2020-03-03 (×11): 5000 [IU] via SUBCUTANEOUS
  Filled 2020-02-26 (×11): qty 1

## 2020-02-26 MED ORDER — SODIUM CHLORIDE 0.9 % IV BOLUS
1000.0000 mL | Freq: Once | INTRAVENOUS | Status: AC
  Administered 2020-02-26: 1000 mL via INTRAVENOUS

## 2020-02-26 MED ORDER — SENNOSIDES-DOCUSATE SODIUM 8.6-50 MG PO TABS
1.0000 | ORAL_TABLET | Freq: Every day | ORAL | Status: DC
  Administered 2020-02-27 – 2020-03-03 (×6): 1 via ORAL
  Filled 2020-02-26 (×6): qty 1

## 2020-02-26 MED ORDER — DONEPEZIL HCL 10 MG PO TABS
10.0000 mg | ORAL_TABLET | Freq: Every day | ORAL | Status: DC
  Administered 2020-02-27 – 2020-02-29 (×3): 10 mg via ORAL
  Filled 2020-02-26 (×2): qty 1
  Filled 2020-02-26: qty 2

## 2020-02-26 MED ORDER — SODIUM CHLORIDE 0.9 % IV SOLN: INTRAVENOUS | Status: DC

## 2020-02-26 MED ORDER — HYDROXYZINE HCL 25 MG PO TABS: 25.0000 mg | ORAL_TABLET | Freq: Three times a day (TID) | ORAL | Status: DC | PRN

## 2020-02-26 MED ORDER — POLYETHYLENE GLYCOL 3350 17 G PO PACK: 1.0000 | PACK | Freq: Every day | ORAL | Status: DC | PRN

## 2020-02-26 MED ORDER — ADULT MULTIVITAMIN W/MINERALS CH
ORAL_TABLET | Freq: Every day | ORAL | Status: DC
  Administered 2020-02-27 – 2020-03-03 (×6): 1 via ORAL
  Filled 2020-02-26 (×6): qty 1

## 2020-02-26 MED ORDER — SODIUM CHLORIDE 0.9 % IV SOLN
1.0000 g | INTRAVENOUS | Status: DC
  Administered 2020-02-27 – 2020-03-01 (×4): 1 g via INTRAVENOUS
  Filled 2020-02-26 (×4): qty 1
  Filled 2020-02-26: qty 10

## 2020-02-26 MED ORDER — ACETAMINOPHEN 500 MG PO TABS
500.0000 mg | ORAL_TABLET | Freq: Four times a day (QID) | ORAL | Status: DC | PRN
  Administered 2020-02-28 – 2020-02-29 (×2): 500 mg via ORAL
  Filled 2020-02-26 (×2): qty 1

## 2020-02-26 MED ORDER — ROSUVASTATIN CALCIUM 5 MG PO TABS: 40.0000 mg | ORAL_TABLET | Freq: Every day | ORAL | Status: DC

## 2020-02-26 MED ORDER — ASPIRIN 81 MG PO CHEW
81.0000 mg | CHEWABLE_TABLET | Freq: Every day | ORAL | Status: DC
  Administered 2020-02-27 – 2020-03-03 (×6): 81 mg via ORAL
  Filled 2020-02-26 (×6): qty 1

## 2020-02-26 MED ORDER — SODIUM CHLORIDE 0.9 % IV SOLN
1.0000 g | Freq: Once | INTRAVENOUS | Status: AC
  Administered 2020-02-26: 1 g via INTRAVENOUS
  Filled 2020-02-26: qty 10

## 2020-02-26 NOTE — ED Notes (Signed)
Paged triad hospitalist about pt SBP 80 and dropping with confusion.

## 2020-02-26 NOTE — H&P (Signed)
History and Physical    DATRA CLARY CVE:[id number] DOB: [birth year] DOA: 02/26/2020  PCP: Patient, No Pcp Per (Confirm with patient/family/NH records and if not entered, this has to be entered at [facility] point of entry) Patient coming from: Home  I have personally briefly reviewed patient's old medical records in [facility]  Chief Complaint: Weakness  HPI: NANCEY KREITZ is a 79 y.o. female with medical history significant of HTN, dementia, HLD, presented with generalized weakness and frequent falls.  Symptoms started 2-3 months ago, and gradually getting worse.  Came to ED about 6 weeks ago with a near syncope episode was found to have a UTI, treated with 5 days of Keflex.  During the same ER visit, it was found the patient blood pressure on the lower side, kidney function evaluated, as per ER record lisinopril was discontinued.  Daughter at bedside confirmed that lisinopril was never restarted after last ER visit.  Symptoms improved afterwards however started getting worse for last 2 weeks with recurrent generalized weakness, frequent falls.  Denied any loss of consciousness, admitted sometimes has lightheadedness.  Daughter at bedside reported patient has been having unsteady gait was recommended by her neurology for PT evaluation which was not started yet.  Daughter also reported patient has had decreasing appetite, no abdominal pain, and daughter has been checking patient blood pressure at home and systolic was in the 70 to 80s range in the last 2 weeks.  Daughter reported patient used to weigh >200 lbs last year and he states her weight down to 140 LBS, and patient has been complaining of feeling cold all the time. She denied any diarrhea, no urinary symptoms no abdominal pain.  No fever chills no cough. ED Course: Systolic blood pressure in the 80s, creatinine 2.0 compared to 2.1, 3 weeks ago.  Review of Systems: As per HPI otherwise 14 point review of systems negative.    Past Medical  History:  Diagnosis Date  . Anemia    on iron  . Anxiety   . Arthritis    shoulders, knees, hands, hips; status post left knee surgery  . Chronic bronchitis (HCC)    CXR 07/08/16  . Depression   . GERD (gastroesophageal reflux disease)    TAKES TUMS  . H/O: pneumonia 2003  . Hyperlipidemia    on med  . Hypertension   . Morbid obesity with BMI of 45.0-49.9, adult (HCC)   . Shingles    recent 02/2017  . Thoracic aortic atherosclerosis (HCC)    CXR 07/08/16    Past Surgical History:  Procedure Laterality Date  . CARDIAC CATHETERIZATION  04/20/1996   patent coronaries, EF 74% (Dr. Aram Candela)  . CARPAL TUNNEL RELEASE Left   . CATARACT EXTRACTION, BILATERAL    . COLONOSCOPY    . KNEE SURGERY Left 11/2001  . NM MYOVIEW LTD  06/17/2018   EF 60-65 %.  Apical thinning.  LOW RISK no ischemia or infarction.  . REPLACEMENT TOTAL KNEE Left 10/2009  . REPLACEMENT TOTAL KNEE Left 10/2009  . SHOULDER OPEN ROTATOR CUFF REPAIR  03/01/2012   Procedure: ROTATOR CUFF REPAIR SHOULDER OPEN;  Surgeon: Mable Paris, MD;  Location: [facility] OR;  Service: Orthopedics;  Laterality: Left;  SUBSCAPULARIS REPAIR  . SVD     x 7  . TOTAL HIP ARTHROPLASTY Right 07/24/2016  . TOTAL HIP ARTHROPLASTY Right 07/24/2016   Procedure: RIGHT TOTAL HIP ARTHROPLASTY ANTERIOR APPROACH;  Surgeon: [name], MD;  Location: [facility] OR;  Service:  Orthopedics;  Laterality: Right;  . TOTAL HIP ARTHROPLASTY Left 04/09/2017   Procedure: LEFT TOTAL HIP ARTHROPLASTY ANTERIOR APPROACH;  Surgeon: [name], MD;  Location: [facility] ORS;  Service: Orthopedics;  Laterality: Left;  . TOTAL SHOULDER ARTHROPLASTY  01/18/2012   Procedure: TOTAL SHOULDER ARTHROPLASTY;  Surgeon: Mable Paris, MD;  Location: [facility] OR;  Service: Orthopedics;  Laterality: Left;  . TOTAL SHOULDER REPLACEMENT Left 01/19/2012  . TRANSTHORACIC ECHOCARDIOGRAM  06/16/2018   EF 60 to 65%.  GR 1 DD.  Normal mitral valve.  Mild aortic sclerosis but no stenosis.  .  TUBAL LIGATION    . WISDOM TOOTH EXTRACTION       reports that she quit smoking about 35 years ago. Her smoking use included cigarettes. She has a 25.00 pack-year smoking history. She has quit using smokeless tobacco. She reports that she does not drink alcohol and does not use drugs.  Allergies  Allergen Reactions  . Effexor [Venlafaxine Hcl] Swelling and Other (See Comments)    MD ADVISES PATIENT TO NOT TAKE IN FUTURE PT STOPPED, MD DC'd MED  . Sulfa Drugs Cross Reactors Itching    Family History  Problem Relation Age of Onset  . Heart disease Mother   . Heart disease Father   . Heart disease Sister   . Hypertension Sister   . Heart disease Sister   . Hypertension Sister   . Heart disease Sister   . Hypertension Sister     Prior to Admission medications   Medication Sig Start Date End Date Taking? Authorizing Provider  acetaminophen (TYLENOL) 500 MG tablet Take 500 mg by mouth every 6 (six) hours as needed for mild pain.   Yes [provider]  aspirin 81 MG chewable tablet Chew 81 mg by mouth daily.   Yes [provider]  donepezil (ARICEPT) 10 MG tablet Take 1 tablet daily Patient taking differently: Take 10 mg by mouth daily. Take 1 tablet daily 01/26/20  Yes [name], MD  hydrOXYzine (ATARAX/VISTARIL) 25 MG tablet Take 1 tablet (25 mg total) by mouth 3 (three) times daily as needed. Patient taking differently: Take 25 mg by mouth 3 (three) times daily as needed for anxiety.  01/26/20  Yes [name], MD  Multiple Vitamins-Minerals (MULTI-VITAMIN GUMMIES PO) Take 1 tablet by mouth daily.    Yes [provider]  polyethylene glycol powder (GLYCOLAX/MIRALAX) 17 GM/SCOOP powder Mix 1 capful of powder in drink and take by mouth one to three times daily as needed for daily soft stools  OTC Patient taking differently: Take 0.5 Containers by mouth daily as needed for mild constipation.  12/13/18  Yes Little, Ambrose Finland, MD  rosuvastatin  (CRESTOR) 40 MG tablet Take 1 tablet (40 mg total) by mouth daily. 01/26/20  Yes [name], MD  sennosides-docusate sodium (SENOKOT-S) 8.6-50 MG tablet Take 1 tablet by mouth daily.   Yes [provider]  trimethoprim (TRIMPEX) 100 MG tablet Take 1 tablet (100 mg total) by mouth daily. 12/14/19  Yes [name], MD  lisinopril (ZESTRIL) 10 MG tablet Take 1 tablet (10 mg total) by mouth daily. 12/14/19   [name], MD    Physical Exam: Vitals:   02/26/20 1630 02/26/20 1700 02/26/20 1800 02/26/20 1830  BP:  (!) 88/58 (!) 84/57 (!) 84/47  Pulse:  (!) 55 (!) 54 62  Resp:  15 16 17   Temp: 97.8 F (36.6 C)     TempSrc: Rectal  SpO2:  100% 100% 100%    Constitutional: NAD, calm, comfortable Vitals:   02/26/20 1630 02/26/20 1700 02/26/20 1800 02/26/20 1830  BP:  (!) 88/58 (!) 84/57 (!) 84/47  Pulse:  (!) 55 (!) 54 62  Resp:  15 16 17   Temp: 97.8 F (36.6 C)     TempSrc: Rectal     SpO2:  100% 100% 100%   Eyes: PERRL, lids and conjunctivae normal ENMT: Mucous membranes are moist. Posterior pharynx clear of any exudate or lesions.Normal dentition.  Neck: normal, supple, no masses, no thyromegaly Respiratory: clear to auscultation bilaterally, no wheezing, no crackles. Normal respiratory effort. No accessory muscle use.  Cardiovascular: Regular rate and rhythm, systolic murmurs on heart base. 2+ extremity edema. 2+ pedal pulses. No carotid bruits.  Abdomen: no tenderness, no masses palpated. No hepatosplenomegaly. Bowel sounds positive.  Musculoskeletal: no clubbing / cyanosis. No joint deformity upper and lower extremities. Good ROM, no contractures. Normal muscle tone.  Skin: no rashes, lesions, ulcers. No induration Neurologic: CN 2-12 grossly intact. Sensation intact, DTR normal. Strength 5/5 in all 4.  Psychiatric: Normal judgment and insight. Alert and oriented x 3. Normal mood.     Labs on Admission: I have personally reviewed following labs and imaging  studies  CBC: Recent Labs  Lab 02/26/20 1520  WBC 10.1  NEUTROABS 8.2*  HGB 10.4*  HCT 33.9*  MCV 93.4  PLT 249   Basic Metabolic Panel: Recent Labs  Lab 02/26/20 1520  NA 138  K 4.1  CL 101  CO2 24  GLUCOSE 120*  BUN 34*  CREATININE 2.04*  CALCIUM 10.2   GFR: CrCl cannot be calculated (Unknown ideal weight.). Liver Function Tests: Recent Labs  Lab 02/26/20 1520  AST 76*  ALT 42  ALKPHOS 63  BILITOT 0.8  PROT 7.1  ALBUMIN 3.7   No results for input(s): LIPASE, AMYLASE in the last 168 hours. No results for input(s): AMMONIA in the last 168 hours. Coagulation Profile: No results for input(s): INR, PROTIME in the last 168 hours. Cardiac Enzymes: No results for input(s): CKTOTAL, CKMB, CKMBINDEX, TROPONINI in the last 168 hours. BNP (last 3 results) No results for input(s): PROBNP in the last 8760 hours. HbA1C: No results for input(s): HGBA1C in the last 72 hours. CBG: No results for input(s): GLUCAP in the last 168 hours. Lipid Profile: No results for input(s): CHOL, HDL, LDLCALC, TRIG, CHOLHDL, LDLDIRECT in the last 72 hours. Thyroid Function Tests: No results for input(s): TSH, T4TOTAL, FREET4, T3FREE, THYROIDAB in the last 72 hours. Anemia Panel: No results for input(s): VITAMINB12, FOLATE, FERRITIN, TIBC, IRON, RETICCTPCT in the last 72 hours. Urine analysis:    Component Value Date/Time   COLORURINE YELLOW 02/26/2020 1815   APPEARANCEUR CLOUDY (A) 02/26/2020 1815   LABSPEC 1.017 02/26/2020 1815   PHURINE 5.0 02/26/2020 1815   GLUCOSEU NEGATIVE 02/26/2020 1815   HGBUR MODERATE (A) 02/26/2020 1815   BILIRUBINUR NEGATIVE 02/26/2020 1815   BILIRUBINUR negative 08/11/2017 1420   BILIRUBINUR neg 01/11/2014 0939   KETONESUR 5 (A) 02/26/2020 1815   PROTEINUR 100 (A) 02/26/2020 1815   UROBILINOGEN 0.2 08/11/2017 1420   UROBILINOGEN 0.2 01/11/2012 1027   NITRITE NEGATIVE 02/26/2020 1815   LEUKOCYTESUR MODERATE (A) 02/26/2020 1815    Radiological  Exams on Admission: CT Head Wo Contrast  Result Date: 02/26/2020 CLINICAL DATA:  Fall EXAM: CT HEAD WITHOUT CONTRAST TECHNIQUE: Contiguous axial images were obtained from the base of the skull through the vertex without intravenous contrast. COMPARISON:  02/10/2020 FINDINGS: Brain: There is atrophy and chronic small vessel disease changes. No acute intracranial abnormality. Specifically, no hemorrhage, hydrocephalus, mass lesion, acute infarction, or significant intracranial injury. Vascular: No hyperdense vessel or unexpected calcification. Skull: No acute calvarial abnormality. Sinuses/Orbits: Visualized paranasal sinuses and mastoids clear. Orbital soft tissues unremarkable. Other: None IMPRESSION: Atrophy, chronic microvascular disease. No acute intracranial abnormality. Electronically Signed   By: Charlett Nose M.D.   On: 02/26/2020 15:08   DG Chest Port 1 View  Result Date: 02/26/2020 CLINICAL DATA:  79 year old female with weakness and fall to the floor. EXAM: PORTABLE CHEST 1 VIEW COMPARISON:  Chest radiographs 06/16/2018 and earlier. FINDINGS: Portable AP semi upright view at 1439 hours. Stable cardiomegaly and tortuous thoracic aorta. Other mediastinal contours are within normal limits. Lung volumes are within normal limits. Allowing for portable technique the lungs are clear. No pneumothorax. Visualized tracheal air column is within normal limits. Stable visualized osseous structures, including advanced degenerative changes at the shoulders and previous left shoulder arthroplasty. Negative visible bowel gas pattern. IMPRESSION: No acute cardiopulmonary abnormality or acute traumatic injury identified. Electronically Signed   By: Odessa Fleming M.D.   On: 02/26/2020 14:48    EKG: Independently reviewed.  Sinus rhythm, no acute ST-T changes  Assessment/Plan Active Problems:   AKI (acute kidney injury) (HCC)   Hypotension  (please populate well all problems here in Problem List. (For example, if  patient is on BP meds at home and you resume or decide to hold them, it is a problem that needs to be her. Same for CAD, COPD, HLD and so on)  Hypotension -Lactic acid white count within normal limits, appears to be euvolemic.  Doubt patient septic although she might have a recurrent UTI. -She is hypotensive, but no tachycardia, recent concern about the potential CHF, hypothyroidism, adrenal insufficiency. -Check echo, TSH, cortisol level. -Trial of short course of IV fluid normal saline 100 mL/h x 12 hours and reevaluate in a.m. -Orthostatic vital signs starting in the morning.  AKI -Check renal ultrasound -Check uric acid -IVF  Generalized weakness -Treat hypotension and UTI, PT evaluation  Recurrent UTI -Most recent urine culture showed pansensitive E. coli, will continue Rocephin for now.  Peripheral edema -DVT study  DVT prophylaxis: Heparin subcu Code Status: Full code Family Communication: Daughter at bedside Disposition Plan: Likely will need more than 2 midnight hospital stay to treat AKI, UTI and work-up for hypertension. Consults called: None Admission status: Telemetry admission   Emeline General MD [facility] Pager [phone]  02/26/2020, 7:12 PM

## 2020-02-26 NOTE — ED Notes (Signed)
Opyd, MD put in orders for bolus. Monica Becton agrees and told this RN to call after bolus if BP has not improved.

## 2020-02-26 NOTE — ED Notes (Signed)
Patient transported to CT 

## 2020-02-26 NOTE — ED Notes (Signed)
Monica Becton MD paged about pt BP 72/47 MAP 57 after NS bolus is halfway done.

## 2020-02-26 NOTE — ED Notes (Signed)
[name] wants to continue the rest of the bolus and to obtain manual blood pressure and make her aware of the results.

## 2020-02-26 NOTE — ED Provider Notes (Addendum)
[facility] EMERGENCY DEPARTMENT Provider Note   CSN: [id number] Arrival date & time: 02/26/20  1355     History Chief Complaint  Patient presents with  . Weakness    [name] is a 79 y.o. female.  79 year old female brought in by EMS from home for weakness, ongoing for the past 2 weeks. Patient's daughter is at bedside and provides history. Daughter reports patient sliding out of bed, too weak to stand, normally able to stand and care for self during the day while daughter is at work. Patient called daughter today to day she had fallen out of bed again which prompted ER visit. No significant falls (reports sliding out of bed, no injury), no injuries from the falls. History of prior left shoulder surgery but daughter feels left arm and leg are more weak than usual. Patient was seen in the ER 2 weeks ago, diagnosed with a UTI and treated with keflex (senstive on culture). Patient states her symptoms really started about 3 weeks ago when she got her COVID booster.         Past Medical History:  Diagnosis Date  . Anemia    on iron  . Anxiety   . Arthritis    shoulders, knees, hands, hips; status post left knee surgery  . Chronic bronchitis (HCC)    CXR 07/08/16  . Depression   . GERD (gastroesophageal reflux disease)    TAKES TUMS  . H/O: pneumonia 2003  . Hyperlipidemia    on med  . Hypertension   . Morbid obesity with BMI of 45.0-49.9, adult (HCC)   . Shingles    recent 02/2017  . Thoracic aortic atherosclerosis (HCC)    CXR 07/08/16    Patient Active Problem List   Diagnosis Date Noted  . Midline cystocele 05/03/2019  . OAB (overactive bladder) 05/03/2019  . Urinary retention 05/03/2019  . Uterine procidentia 05/03/2019  . Chest pain, negative stress test. non cardiac. 06/16/2018  . Primary osteoarthritis of left hip 01/20/2017  . Primary osteoarthritis of right hip 11/10/2015  . Anxiety state 11/10/2015  . Preop cardiovascular exam  07/23/2014  . Bilateral edema of lower extremity 07/18/2013    Class: Chronic  . Metabolic syndrome 07/18/2013  . Rectocele 07/07/2012  . Essential hypertension 07/07/2012  . GERD (gastroesophageal reflux disease) 07/07/2012  . Hyperlipidemia with target LDL less than 100 07/07/2012  . Rotator cuff tear, left 03/02/2012  . Osteoarthritis of left shoulder 01/21/2012    Past Surgical History:  Procedure Laterality Date  . CARDIAC CATHETERIZATION  04/20/1996   patent coronaries, EF 74% (Dr. Aram Candela)  . CARPAL TUNNEL RELEASE Left   . CATARACT EXTRACTION, BILATERAL    . COLONOSCOPY    . KNEE SURGERY Left 11/2001  . NM MYOVIEW LTD  06/17/2018   EF 60-65 %.  Apical thinning.  LOW RISK no ischemia or infarction.  . REPLACEMENT TOTAL KNEE Left 10/2009  . REPLACEMENT TOTAL KNEE Left 10/2009  . SHOULDER OPEN ROTATOR CUFF REPAIR  03/01/2012   Procedure: ROTATOR CUFF REPAIR SHOULDER OPEN;  Surgeon: Mable Paris, MD;  Location: [facility] OR;  Service: Orthopedics;  Laterality: Left;  SUBSCAPULARIS REPAIR  . SVD     x 7  . TOTAL HIP ARTHROPLASTY Right 07/24/2016  . TOTAL HIP ARTHROPLASTY Right 07/24/2016   Procedure: RIGHT TOTAL HIP ARTHROPLASTY ANTERIOR APPROACH;  Surgeon: [name], MD;  Location: [facility] OR;  Service: Orthopedics;  Laterality: Right;  . TOTAL HIP ARTHROPLASTY  Left 04/09/2017   Procedure: LEFT TOTAL HIP ARTHROPLASTY ANTERIOR APPROACH;  Surgeon: [name], MD;  Location: [facility] ORS;  Service: Orthopedics;  Laterality: Left;  . TOTAL SHOULDER ARTHROPLASTY  01/18/2012   Procedure: TOTAL SHOULDER ARTHROPLASTY;  Surgeon: Mable Paris, MD;  Location: [facility] OR;  Service: Orthopedics;  Laterality: Left;  . TOTAL SHOULDER REPLACEMENT Left 01/19/2012  . TRANSTHORACIC ECHOCARDIOGRAM  06/16/2018   EF 60 to 65%.  GR 1 DD.  Normal mitral valve.  Mild aortic sclerosis but no stenosis.  . TUBAL LIGATION    . WISDOM TOOTH EXTRACTION       OB History   No obstetric history on file.       Family History  Problem Relation Age of Onset  . Heart disease Mother   . Heart disease Father   . Heart disease Sister   . Hypertension Sister   . Heart disease Sister   . Hypertension Sister   . Heart disease Sister   . Hypertension Sister     Social History   Tobacco Use  . Smoking status: Former Smoker    Packs/day: 1.00    Years: 25.00    Pack years: 25.00    Types: Cigarettes    Quit date: 03/07/1984    Years since quitting: 35.9  . Smokeless tobacco: Former Clinical biochemist  . Vaping Use: Never used  Substance Use Topics  . Alcohol use: No  . Drug use: No    Home Medications Prior to Admission medications   Medication Sig Start Date End Date Taking? Authorizing Provider  acetaminophen (TYLENOL) 500 MG tablet Take 500 mg by mouth every 6 (six) hours as needed for mild pain.   Yes [provider]  aspirin 81 MG chewable tablet Chew 81 mg by mouth daily.   Yes [provider]  donepezil (ARICEPT) 10 MG tablet Take 1 tablet daily Patient taking differently: Take 10 mg by mouth daily. Take 1 tablet daily 01/26/20  Yes [name], MD  hydrOXYzine (ATARAX/VISTARIL) 25 MG tablet Take 1 tablet (25 mg total) by mouth 3 (three) times daily as needed. Patient taking differently: Take 25 mg by mouth 3 (three) times daily as needed for anxiety.  01/26/20  Yes [name], MD  Multiple Vitamins-Minerals (MULTI-VITAMIN GUMMIES PO) Take 1 tablet by mouth daily.    Yes [provider]  polyethylene glycol powder (GLYCOLAX/MIRALAX) 17 GM/SCOOP powder Mix 1 capful of powder in drink and take by mouth one to three times daily as needed for daily soft stools  OTC Patient taking differently: Take 0.5 Containers by mouth daily as needed for mild constipation.  12/13/18  Yes Little, Ambrose Finland, MD  rosuvastatin (CRESTOR) 40 MG tablet Take 1 tablet (40 mg total) by mouth daily. 01/26/20  Yes [name], MD  sennosides-docusate sodium  (SENOKOT-S) 8.6-50 MG tablet Take 1 tablet by mouth daily.   Yes [provider]  trimethoprim (TRIMPEX) 100 MG tablet Take 1 tablet (100 mg total) by mouth daily. 12/14/19  Yes [name], MD  lisinopril (ZESTRIL) 10 MG tablet Take 1 tablet (10 mg total) by mouth daily. 12/14/19   [name], MD    Allergies    Effexor [venlafaxine hcl] and Sulfa drugs cross reactors  Review of Systems   Review of Systems  Unable to perform ROS: Dementia  Constitutional: Negative for fever.  Respiratory: Negative for shortness of breath.   Cardiovascular: Negative for chest pain.  Gastrointestinal:  Negative for abdominal pain, constipation, diarrhea, nausea and vomiting.  Genitourinary: Negative for dysuria.  Musculoskeletal: Negative for back pain, neck pain and neck stiffness.  Skin: Negative for rash and wound.  Neurological: Positive for weakness.    Physical Exam Updated Vital Signs BP (!) 84/47   Pulse 62   Temp 97.8 F (36.6 C) (Rectal)   Resp 17   SpO2 100%   Physical Exam Vitals and nursing note reviewed.  Constitutional:      General: She is not in acute distress.    Appearance: She is well-developed. She is not diaphoretic.  HENT:     Head: Normocephalic and atraumatic.     Mouth/Throat:     Mouth: Mucous membranes are moist.  Eyes:     Pupils: Pupils are equal, round, and reactive to light.  Cardiovascular:     Rate and Rhythm: Normal rate and regular rhythm.     Heart sounds: Normal heart sounds.  Pulmonary:     Effort: Pulmonary effort is normal.     Breath sounds: Normal breath sounds.  Abdominal:     Palpations: Abdomen is soft.     Tenderness: There is no abdominal tenderness.  Musculoskeletal:        General: No tenderness or deformity.     Right lower leg: No edema.     Left lower leg: Edema present.     Comments: Minor/small areas of bruising to anterior lower legs. No pain with ROM legs/hips. Mild swelling to left leg, no calf tenderness.     Skin:    General: Skin is warm and dry.     Findings: Bruising present. No erythema or rash.  Neurological:     Mental Status: She is alert and oriented to person, place, and time.     Cranial Nerves: No cranial nerve deficit.     Sensory: No sensory deficit.     Motor: Weakness present.     Comments: Alert to person, place, year Legs fall to gravity Left arm weak compared to right but does not hit bed  Psychiatric:        Behavior: Behavior normal.     ED Results / Procedures / Treatments   Labs (all labs ordered are listed, but only abnormal results are displayed) Labs Reviewed  COMPREHENSIVE METABOLIC PANEL - Abnormal; Notable for the following components:      Result Value   Glucose, Bld 120 (*)    BUN 34 (*)    Creatinine, Ser 2.04 (*)    AST 76 (*)    GFR, Estimated 25 (*)    All other components within normal limits  CBC WITH DIFFERENTIAL/PLATELET - Abnormal; Notable for the following components:   RBC 3.63 (*)    Hemoglobin 10.4 (*)    HCT 33.9 (*)    RDW 16.2 (*)    Neutro Abs 8.2 (*)    All other components within normal limits  URINALYSIS, ROUTINE W REFLEX MICROSCOPIC - Abnormal; Notable for the following components:   APPearance CLOUDY (*)    Hgb urine dipstick MODERATE (*)    Ketones, ur 5 (*)    Protein, ur 100 (*)    Leukocytes,Ua MODERATE (*)    WBC, UA >50 (*)    Bacteria, UA MANY (*)    All other components within normal limits  TROPONIN I (HIGH SENSITIVITY) - Abnormal; Notable for the following components:   Troponin I (High Sensitivity) 46 (*)    All other components within normal limits  RESPIRATORY PANEL BY RT PCR (FLU A&B, COVID)  CULTURE, BLOOD (ROUTINE X 2)  CULTURE, BLOOD (ROUTINE X 2)  URINE CULTURE  LACTIC ACID, PLASMA  TROPONIN I (HIGH SENSITIVITY)    EKG EKG Interpretation  Date/Time:  Monday February 26 2020 14:10:25 EST Ventricular Rate:  71 PR Interval:    QRS Duration: 122 QT Interval:  451 QTC Calculation: 491 R  Axis:   -36 Text Interpretation: Sinus rhythm Nonspecific IVCD with LAD Nonspecific T abnrm, anterolateral leads Confirmed by Benjiman Core [phone]) on 02/26/2020 2:12:40 PM   Radiology CT Head Wo Contrast  Result Date: 02/26/2020 CLINICAL DATA:  Fall EXAM: CT HEAD WITHOUT CONTRAST TECHNIQUE: Contiguous axial images were obtained from the base of the skull through the vertex without intravenous contrast. COMPARISON:  02/10/2020 FINDINGS: Brain: There is atrophy and chronic small vessel disease changes. No acute intracranial abnormality. Specifically, no hemorrhage, hydrocephalus, mass lesion, acute infarction, or significant intracranial injury. Vascular: No hyperdense vessel or unexpected calcification. Skull: No acute calvarial abnormality. Sinuses/Orbits: Visualized paranasal sinuses and mastoids clear. Orbital soft tissues unremarkable. Other: None IMPRESSION: Atrophy, chronic microvascular disease. No acute intracranial abnormality. Electronically Signed   By: Charlett Nose M.D.   On: 02/26/2020 15:08   DG Chest Port 1 View  Result Date: 02/26/2020 CLINICAL DATA:  79 year old female with weakness and fall to the floor. EXAM: PORTABLE CHEST 1 VIEW COMPARISON:  Chest radiographs 06/16/2018 and earlier. FINDINGS: Portable AP semi upright view at 1439 hours. Stable cardiomegaly and tortuous thoracic aorta. Other mediastinal contours are within normal limits. Lung volumes are within normal limits. Allowing for portable technique the lungs are clear. No pneumothorax. Visualized tracheal air column is within normal limits. Stable visualized osseous structures, including advanced degenerative changes at the shoulders and previous left shoulder arthroplasty. Negative visible bowel gas pattern. IMPRESSION: No acute cardiopulmonary abnormality or acute traumatic injury identified. Electronically Signed   By: Odessa Fleming M.D.   On: 02/26/2020 14:48    Procedures Procedures (including critical care  time)  Medications Ordered in ED Medications  cefTRIAXone (ROCEPHIN) 1 g in sodium chloride 0.9 % 100 mL IVPB (has no administration in time range)  sodium chloride 0.9 % bolus 500 mL (0 mLs Intravenous Stopped 02/26/20 1630)  sodium chloride 0.9 % bolus 500 mL (500 mLs Intravenous New Bag/Given 02/26/20 1815)    ED Course  I have reviewed the triage vital signs and the nursing notes.  Pertinent labs & imaging results that were available during my care of the patient were reviewed by me and considered in my medical decision making (see chart for details).  Clinical Course as of Feb 25 1857  Mon Feb 26, 2020  2650 79 year old female brought in by EMS from home for progressively worsening weakness over the past 2-3 weeks, now unable to get out of bed safely and care for self.  Patient found to be hypotensive, given fluid bolus by EMS with additional 500cc in the ER.  Patient with bilateral leg weakness, slight left arm weakness although is able to support arm, this deficit is not acute/new within the last 24 hours. Review of labs, CBC with normal WBC, hgb 104. CMP with Cr 2.04 (review of priors, 01/26/20 Cr had increased from 1.15 to 2.1- patient was told to increase fluid intake, 02/10/20 Cr 1.97- diagnosed with UTI and given Keflex).  Trop today ordered for complaint of weakness, no chest pain, elevated at 46, repeat troponin pending, possibly elevated due to AKI.  COVID/flu  negative. Lactic 1.1. BP improved with fluid bolus, has decreased back to the 80s systolic, additional fluids ordered with consult to hospitalist for admission.     [LM]  1836 Case discussed with Dr. Bernette Mayers, ER attending, agrees with hydration and admission for weakness, AKI.dehydration.  Case discussed with Dr. Chipper Herb with [facility] who will consult for admission.    [LM]  1857 Urine returns concerning for UTI, rocephin added, culture previously ordered.    [LM]    Clinical Course User Index [LM]  Alden Hipp   MDM Rules/Calculators/A&P                          Final Clinical Impression(s) / ED Diagnoses Final diagnoses:  Hypotension, unspecified hypotension type  Weakness  AKI (acute kidney injury) (HCC)  Acute cystitis with hematuria    Rx / DC Orders ED Discharge Orders    None       Jeannie Fend, PA-C 02/26/20 1838    Jeannie Fend, PA-C 02/26/20 1858    Pollyann Savoy, MD 02/26/20 2019

## 2020-02-26 NOTE — ED Triage Notes (Signed)
Pt arrived to ED vis EMS. Pt was at home and went to get up from recliner and she slid to the floor. Per EMS no trauma note, pt was hypotension sbp 70-90, fluid bolus given. Pt with c/o generalized weakness for weeks, denies pain, gcs 15, no LOC, abrasion to anterior chest wall

## 2020-02-26 NOTE — Telephone Encounter (Signed)
Pt with difficulty to get up OOB slid off bed twice-pt  Fainted per daughter stated "due to UTI" beginning 02/21/20.   The left leg are swollen up to knee. Normal sensation and movement to left foot - sx started 2 days ago. No warmth or redness noted. Daughter stated that pt has difficulty getting herself up. Not sure if due swelling in left leg or other etiology. Saw ortho on Friday and xrays were taken and ok but noted to have weakness and ordered PT. Pt has no PCP. Daughter not sure what is going. Pt fell off bed again this am and daughter is afraid to leave her unattended. Daughter stated that when assisted she can stand but is unable to walk due to balance begin off.  . Left arm drift noted. No speech or visual disturbances. Smile is equal. Advised daughter to call 911 to get pt to hospital to rule out stroke or other neurological issues.  Daughter agrees to call EMS. Routing to [facility].  Reason for Disposition . [1] Weakness (i.e., paralysis, loss of muscle strength) of the face, arm / hand, or leg / foot on one side of the body AND [2] sudden onset AND [3] present now (Exception: Bell's palsy suspected [i.e., weakness only one side of the face, developing over hours to days, no other symptoms])  Additional Information . Commented on: [1] SEVERE weakness (i.e., unable to walk or barely able to walk, requires support) AND [2] new-onset or worsening    unale to get OOB independently and and loss of balance  Answer Assessment - Initial Assessment Questions 1. SYMPTOM: "What is the main symptom you are concerned about?" (e.g., weakness, numbness)     Not being able to get up to a standing position and falling to the floor 2. ONSET: "When did this start?" (minutes, hours, days; while sleeping)     Started initially last week with the falling out of bed but has continued 3. LAST NORMAL: "When was the last time you were normal (no symptoms)?"     Prior to UTI sx 4. PATTERN "Does this come and go, or  has it been constant since it started?"  "Is it present now?"   yes 5. CARDIAC SYMPTOMS: "Have you had any of the following symptoms: chest pain, difficulty breathing, palpitations?"      6. NEUROLOGIC SYMPTOMS: "Have you had any of the following symptoms: headache, dizziness, vision loss, double vision, changes in speech, unsteady on your feet?"     Left arm drift, unsteady on feet, unable to independently get OOB 7. OTHER SYMPTOMS: "Do you have any other symptoms?"     swelling to left arm and leg 8. PREGNANCY: "Is there any chance you are pregnant?" "When was your last menstrual period?" n/a  Protocols used: NEUROLOGIC DEFICIT-A-AH

## 2020-02-27 ENCOUNTER — Inpatient Hospital Stay (HOSPITAL_COMMUNITY): Payer: MEDICARE

## 2020-02-27 DIAGNOSIS — I959 Hypotension, unspecified: Principal | ICD-10-CM

## 2020-02-27 DIAGNOSIS — R011 Cardiac murmur, unspecified: Secondary | ICD-10-CM | POA: Diagnosis not present

## 2020-02-27 DIAGNOSIS — R609 Edema, unspecified: Secondary | ICD-10-CM | POA: Diagnosis not present

## 2020-02-27 DIAGNOSIS — I361 Nonrheumatic tricuspid (valve) insufficiency: Secondary | ICD-10-CM | POA: Diagnosis not present

## 2020-02-27 DIAGNOSIS — N179 Acute kidney failure, unspecified: Secondary | ICD-10-CM

## 2020-02-27 LAB — ECHOCARDIOGRAM COMPLETE
Area-P 1/2: 2.48 cm2
Calc EF: 60 %
S' Lateral: 2.5 cm
Single Plane A2C EF: 59.3 %
Single Plane A4C EF: 59.7 %

## 2020-02-27 LAB — CBG MONITORING, ED
Glucose-Capillary: 59 mg/dL — ABNORMAL LOW (ref 70–99)
Glucose-Capillary: 74 mg/dL (ref 70–99)

## 2020-02-27 LAB — BASIC METABOLIC PANEL
Anion gap: 9 (ref 5–15)
BUN: 27 mg/dL — ABNORMAL HIGH (ref 8–23)
CO2: 21 mmol/L — ABNORMAL LOW (ref 22–32)
Calcium: 8.6 mg/dL — ABNORMAL LOW (ref 8.9–10.3)
Chloride: 113 mmol/L — ABNORMAL HIGH (ref 98–111)
Creatinine, Ser: 1.76 mg/dL — ABNORMAL HIGH (ref 0.44–1.00)
GFR, Estimated: 29 mL/min — ABNORMAL LOW (ref >60)
Glucose, Bld: 94 mg/dL (ref 70–99)
Potassium: 3.5 mmol/L (ref 3.5–5.1)
Sodium: 143 mmol/L (ref 135–145)

## 2020-02-27 LAB — CK: Total CK: 1775 U/L — ABNORMAL HIGH (ref 38–234)

## 2020-02-27 LAB — GLUCOSE, CAPILLARY: Glucose-Capillary: 103 mg/dL — ABNORMAL HIGH (ref 70–99)

## 2020-02-27 LAB — LACTIC ACID, PLASMA: Lactic Acid, Venous: 0.8 mmol/L (ref 0.5–1.9)

## 2020-02-27 MED ORDER — LACTATED RINGERS IV SOLN: INTRAVENOUS | Status: AC

## 2020-02-27 MED ORDER — PERFLUTREN LIPID MICROSPHERE
1.0000 mL | INTRAVENOUS | Status: AC | PRN
  Administered 2020-02-27: 2 mL via INTRAVENOUS
  Filled 2020-02-27: qty 10

## 2020-02-27 MED ORDER — LIDOCAINE 5 % EX PTCH
1.0000 | MEDICATED_PATCH | CUTANEOUS | Status: DC
  Administered 2020-02-27 – 2020-03-03 (×6): 1 via TRANSDERMAL
  Filled 2020-02-27 (×6): qty 1

## 2020-02-27 MED ORDER — SODIUM CHLORIDE 0.9 % IV BOLUS
500.0000 mL | Freq: Once | INTRAVENOUS | Status: AC
  Administered 2020-02-27: 500 mL via INTRAVENOUS

## 2020-02-27 NOTE — Progress Notes (Addendum)
PROGRESS NOTE    [name]  NOB:[id number] DOB: Jun 17, 1940 DOA: 02/26/2020 PCP: Patient, No Pcp Per   Brief Narrative: This 79 years old female with medical history significant for hypertension, dementia, hyperlipidemia presents in the emergency department with generalized weakness and frequent falls.  History is obtained from daughter, she reports symptoms for last few months and progressively getting worse.  Patient has poor appetite, chronically low blood pressure for few months.  Daughter reports similar episode 6 weeks ago,  found to have a UTI and acute kidney injury.  she was discharged on Keflex after kidney function slightly improved. Patient is admitted for hypotension, acute kidney injury, generalized weakness secondary to recurrent UTI and started on  IV. antibiotics.  Assessment & Plan:   Active Problems:   AKI (acute kidney injury) (HCC)   Hypotension   Hypotension: -Daughter reports she remains with chronically low blood pressure. -Lactic acid , white count within normal limits, appears to be euvolemic.    -Doubt patient septic although she might have a recurrent UTI. -She is hypotensive, but no tachycardia, recent concern about the potential CHF, hypothyroidism, adrenal insufficiency. -Patient has received 3 L of IV fluids blood pressure  still remained low. -PCCM consulted blood pressure is slightly improved.  PCCM recommended continue monitoring in progressive. -Echocardiogram unremarkable EF 55 consistent grade 1 diastolic dysfunction.  TSH and cortisol level normal - check Orthostatic vital signs  AKI >>> improving -Renal ultrasound unremarkable -Check uric acid -Continue IVF  Generalized weakness -Treat hypotension and UTI,  - PT/OT evaluation  Recurrent UTI -Most recent urine culture showed pansensitive E. coli, will continue Rocephin for now. Follow-up blood and urine cultures  Peripheral edema -Venous duplex completed report  pending.  Elevated CK: This could be due to fall Continue IVF, recheck CK   DVT prophylaxis: Heparin subcu Code Status: Full code Family Communication: No one at bedside Disposition Plan:  Status is: Inpatient  Remains inpatient appropriate because:Hemodynamically unstable and Inpatient level of care appropriate due to severity of illness   Dispo: The patient is from: Home              Anticipated d/c is to: SNF              Anticipated d/c date is: 2 days              Patient currently is not medically stable to d/c.   Consultants:    PCCM  Procedures:  Antimicrobials:  Anti-infectives (From admission, onward)   Start     Dose/Rate Route Frequency Ordered Stop   02/27/20 2000  cefTRIAXone (ROCEPHIN) 1 g in sodium chloride 0.9 % 100 mL IVPB        1 g 200 mL/hr over 30 Minutes Intravenous Every 24 hours 02/26/20 1912 03/03/20 1959   02/26/20 1900  cefTRIAXone (ROCEPHIN) 1 g in sodium chloride 0.9 % 100 mL IVPB        1 g 200 mL/hr over 30 Minutes Intravenous  Once 02/26/20 1856 02/26/20 2107     Subjective: Patient was seen and examined at bedside. Overnight events noted.  Patient appears asymptomatic.   She denies any chest pain,  shortness of breath,  urinary burning or abdominal pain.  Systolic blood pressure is 94.  Objective: Vitals:   02/27/20 1030 02/27/20 1300 02/27/20 1415 02/27/20 1600  BP: 100/64 91/61 (!) 102/58 (!) 87/58  Pulse: 75 (!) 56 66 92  Resp: 20 20 17 18   Temp:  TempSrc:      SpO2: 99% 100% 97% (!) 51%   No intake or output data in the 24 hours ending 02/27/20 1634 There were no vitals filed for this visit.  Examination:  General exam: Appears calm and comfortable  Respiratory system: Clear to auscultation. Respiratory effort normal. Cardiovascular system: S1 & S2 heard, RRR. No JVD, murmurs, rubs, gallops or clicks. No pedal edema. Gastrointestinal system: Abdomen is nondistended, soft and nontender. No organomegaly or masses  felt. Normal bowel sounds heard. Central nervous system: Alert and oriented. No focal neurological deficits. Extremities: Symmetric 5 x 5 power. Skin: No rashes, lesions or ulcers Psychiatry: Judgement and insight appear normal. Mood & affect appropriate.     Data Reviewed: I have personally reviewed following labs and imaging studies  CBC: Recent Labs  Lab 02/26/20 1520  WBC 10.1  NEUTROABS 8.2*  HGB 10.4*  HCT 33.9*  MCV 93.4  PLT 249   Basic Metabolic Panel: Recent Labs  Lab 02/26/20 1520 02/27/20 0316  NA 138 143  K 4.1 3.5  CL 101 113*  CO2 24 21*  GLUCOSE 120* 94  BUN 34* 27*  CREATININE 2.04* 1.76*  CALCIUM 10.2 8.6*   GFR: CrCl cannot be calculated (Unknown ideal weight.). Liver Function Tests: Recent Labs  Lab 02/26/20 1520  AST 76*  ALT 42  ALKPHOS 63  BILITOT 0.8  PROT 7.1  ALBUMIN 3.7   No results for input(s): LIPASE, AMYLASE in the last 168 hours. No results for input(s): AMMONIA in the last 168 hours. Coagulation Profile: No results for input(s): INR, PROTIME in the last 168 hours. Cardiac Enzymes: Recent Labs  Lab 02/27/20 0316  CKTOTAL 1,775*   BNP (last 3 results) No results for input(s): PROBNP in the last 8760 hours. HbA1C: No results for input(s): HGBA1C in the last 72 hours. CBG: Recent Labs  Lab 02/27/20 0808 02/27/20 0936  GLUCAP 59* 74   Lipid Profile: No results for input(s): CHOL, HDL, LDLCALC, TRIG, CHOLHDL, LDLDIRECT in the last 72 hours. Thyroid Function Tests: Recent Labs    02/26/20 1909  TSH 0.644   Anemia Panel: No results for input(s): VITAMINB12, FOLATE, FERRITIN, TIBC, IRON, RETICCTPCT in the last 72 hours. Sepsis Labs: Recent Labs  Lab 02/26/20 1520 02/27/20 0316  LATICACIDVEN 1.1 0.8    Recent Results (from the past 240 hour(s))  Respiratory Panel by RT PCR (Flu A&B, Covid) - Nasopharyngeal Swab     Status: None   Collection Time: 02/26/20  2:32 PM   Specimen: Nasopharyngeal Swab  Result  Value Ref Range Status   SARS Coronavirus 2 by RT PCR NEGATIVE NEGATIVE Final    Comment: (NOTE) SARS-CoV-2 target nucleic acids are NOT DETECTED.  The SARS-CoV-2 RNA is generally detectable in upper respiratoy specimens during the acute phase of infection. The lowest concentration of SARS-CoV-2 viral copies this assay can detect is 131 copies/mL. A negative result does not preclude SARS-Cov-2 infection and should not be used as the sole basis for treatment or other patient management decisions. A negative result may occur with  improper specimen collection/handling, submission of specimen other than nasopharyngeal swab, presence of viral mutation(s) within the areas targeted by this assay, and inadequate number of viral copies (<131 copies/mL). A negative result must be combined with clinical observations, patient history, and epidemiological information. The expected result is Negative.  Fact Sheet for Patients:  https://www.moore.com/  Fact Sheet for Healthcare Providers:  https://www.young.biz/  This test is no t yet approved or cleared  by the [place] FDA and  has been authorized for detection and/or diagnosis of SARS-CoV-2 by FDA under an Emergency Use Authorization (EUA). This EUA will remain  in effect (meaning this test can be used) for the duration of the COVID-19 declaration under Section 564(b)(1) of the Act, 21 U.S.C. section 360bbb-3(b)(1), unless the authorization is terminated or revoked sooner.     Influenza A by PCR NEGATIVE NEGATIVE Final   Influenza B by PCR NEGATIVE NEGATIVE Final    Comment: (NOTE) The Xpert Xpress SARS-CoV-2/FLU/RSV assay is intended as an aid in  the diagnosis of influenza from Nasopharyngeal swab specimens and  should not be used as a sole basis for treatment. Nasal washings and  aspirates are unacceptable for Xpert Xpress SARS-CoV-2/FLU/RSV  testing.  Fact Sheet for  Patients: https://www.moore.com/https://www.fda.gov/media/142436/download  Fact Sheet for Healthcare Providers: https://www.young.biz/https://www.fda.gov/media/142435/download  This test is not yet approved or cleared by the [place] FDA and  has been authorized for detection and/or diagnosis of SARS-CoV-2 by  FDA under an Emergency Use Authorization (EUA). This EUA will remain  in effect (meaning this test can be used) for the duration of the  Covid-19 declaration under Section 564(b)(1) of the Act, 21  U.S.C. section 360bbb-3(b)(1), unless the authorization is  terminated or revoked. Performed at [facility], [address] [place], [place] [address]   Culture, blood (routine x 2)     Status: None (Preliminary result)   Collection Time: 02/26/20  3:30 PM   Specimen: BLOOD  Result Value Ref Range Status   Specimen Description BLOOD SITE NOT SPECIFIED  Final   Special Requests   Final    BOTTLES DRAWN AEROBIC AND ANAEROBIC Blood Culture adequate volume   Culture   Final    NO GROWTH < 24 HOURS Performed at [facility], [address] [place], [place] [address]    Report Status PENDING  Incomplete  Culture, blood (routine x 2)     Status: None (Preliminary result)   Collection Time: 02/26/20  5:20 PM   Specimen: BLOOD RIGHT ARM  Result Value Ref Range Status   Specimen Description BLOOD RIGHT ARM  Final   Special Requests   Final    BOTTLES DRAWN AEROBIC AND ANAEROBIC Blood Culture adequate volume   Culture   Final    NO GROWTH < 24 HOURS Performed at [facility], [address] [place], [place] [address]    Report Status PENDING  Incomplete         Radiology Studies: CT Head Wo Contrast  Result Date: 02/26/2020 CLINICAL DATA:  Fall EXAM: CT HEAD WITHOUT CONTRAST TECHNIQUE: Contiguous axial images were obtained from the base of the skull through the vertex without intravenous contrast. COMPARISON:  02/10/2020 FINDINGS: Brain: There is atrophy and chronic small vessel disease  changes. No acute intracranial abnormality. Specifically, no hemorrhage, hydrocephalus, mass lesion, acute infarction, or significant intracranial injury. Vascular: No hyperdense vessel or unexpected calcification. Skull: No acute calvarial abnormality. Sinuses/Orbits: Visualized paranasal sinuses and mastoids clear. Orbital soft tissues unremarkable. Other: None IMPRESSION: Atrophy, chronic microvascular disease. No acute intracranial abnormality. Electronically Signed   By: Charlett NoseKevin  Dover M.D.   On: 02/26/2020 15:08   US RENAL  Result Date: 02/26/2020 CLINICAL DATA:  Acute renal insufficiency EXAM: RENAL / URINARY TRACT ULTRASOUND COMPLETE COMPARISON:  None. FINDINGS: Right Kidney: Renal measurements: 8.3 x 4.4 x 5.0 cm = volume: 101 mL. Echogenicity within normal limits. No mass or hydronephrosis visualized. Three  simple cortical cysts are seen scattered throughout the right kidney. Left Kidney: Renal measurements: 9.0 x 5.2 x 5.4 cm = volume: 134 mL. There is limited visualization of the lower pole of the left kidney due to overlying bowel gas. Echogenicity within normal limits. No mass or hydronephrosis visualized. Bladder: Appears normal for degree of bladder distention. Other: None. IMPRESSION: Slightly limited but unremarkable examination of the kidneys. Electronically Signed   By: Helyn Numbers MD   On: 02/26/2020 23:07   DG Chest Port 1 View  Result Date: 02/26/2020 CLINICAL DATA:  79 year old female with weakness and fall to the floor. EXAM: PORTABLE CHEST 1 VIEW COMPARISON:  Chest radiographs 06/16/2018 and earlier. FINDINGS: Portable AP semi upright view at 1439 hours. Stable cardiomegaly and tortuous thoracic aorta. Other mediastinal contours are within normal limits. Lung volumes are within normal limits. Allowing for portable technique the lungs are clear. No pneumothorax. Visualized tracheal air column is within normal limits. Stable visualized osseous structures, including advanced  degenerative changes at the shoulders and previous left shoulder arthroplasty. Negative visible bowel gas pattern. IMPRESSION: No acute cardiopulmonary abnormality or acute traumatic injury identified. Electronically Signed   By: Odessa Fleming M.D.   On: 02/26/2020 14:48   ECHOCARDIOGRAM COMPLETE  Result Date: 02/27/2020    ECHOCARDIOGRAM REPORT   Patient Name:   [name] Date of Exam: 02/27/2020 Medical Rec #:  [id number]      Height:       61.0 in Accession #:    [id number]     Weight:       140.0 lb Date of Birth:  31-Mar-1941      BSA:          1.623 m Patient Age:    79 years       BP:           80/52 mmHg Patient Gender: F              HR:           63 bpm. Exam Location:  Inpatient Procedure: 2D Echo and Intracardiac Opacification Agent Indications:    Murmur 785.2 / R01.1  History:        Patient has prior history of Echocardiogram examinations, most                 recent 06/16/2018. Signs/Symptoms:dementia; Risk                 Factors:Hypertension and Dyslipidemia. 6 weeks ago had a near                 syncopal event.  Sonographer:    Leta Jungling RDCS Referring Phys: [id number] Emeline General IMPRESSIONS  1. Left ventricular ejection fraction, by estimation, is 55%. The left ventricle has normal function. The left ventricle has no regional wall motion abnormalities. There is mild asymmetric left ventricular hypertrophy of the basal-septal segment. Left ventricular diastolic parameters are consistent with Grade I diastolic dysfunction (impaired relaxation).  2. Right ventricular systolic function is normal. The right ventricular size is mildly enlarged. There is normal pulmonary artery systolic pressure. The estimated right ventricular systolic pressure is 27.4 mmHg.  3. The mitral valve is normal in structure. No evidence of mitral valve regurgitation. No evidence of mitral stenosis.  4. The aortic valve is normal in structure. Aortic valve regurgitation is trivial. No aortic stenosis is present.  5. The  inferior vena cava is normal in size with greater than 50% respiratory  variability, suggesting right atrial pressure of 3 mmHg. FINDINGS  Left Ventricle: Left ventricular ejection fraction, by estimation, is 55%. The left ventricle has normal function. The left ventricle has no regional wall motion abnormalities. Definity contrast agent was given IV to delineate the left ventricular endocardial borders. The left ventricular internal cavity size was normal in size. There is mild asymmetric left ventricular hypertrophy of the basal-septal segment. Left ventricular diastolic parameters are consistent with Grade I diastolic dysfunction (impaired relaxation). Right Ventricle: The right ventricular size is mildly enlarged. No increase in right ventricular wall thickness. Right ventricular systolic function is normal. There is normal pulmonary artery systolic pressure. The tricuspid regurgitant velocity is 2.47  m/s, and with an assumed right atrial pressure of 3 mmHg, the estimated right ventricular systolic pressure is 27.4 mmHg. Left Atrium: Left atrial size was normal in size. Right Atrium: Right atrial size was normal in size. Pericardium: There is no evidence of pericardial effusion. Mitral Valve: The mitral valve is normal in structure. No evidence of mitral valve regurgitation. No evidence of mitral valve stenosis. Tricuspid Valve: The tricuspid valve is normal in structure. Tricuspid valve regurgitation is mild . No evidence of tricuspid stenosis. Aortic Valve: The aortic valve is normal in structure. Aortic valve regurgitation is trivial. No aortic stenosis is present. Pulmonic Valve: The pulmonic valve was normal in structure. Pulmonic valve regurgitation is not visualized. No evidence of pulmonic stenosis. Aorta: The aortic root is normal in size and structure. Venous: The inferior vena cava is normal in size with greater than 50% respiratory variability, suggesting right atrial pressure of 3 mmHg. IAS/Shunts:  No atrial level shunt detected by color flow Doppler.  LEFT VENTRICLE PLAX 2D LVIDd:         3.60 cm      Diastology LVIDs:         2.50 cm      LV e' medial:    5.44 cm/s LV PW:         0.90 cm      LV E/e' medial:  9.2 LV IVS:        1.10 cm      LV e' lateral:   7.18 cm/s LVOT diam:     1.90 cm      LV E/e' lateral: 7.0 LV SV:         60 LV SV Index:   37 LVOT Area:     2.84 cm  LV Volumes (MOD) LV vol d, MOD A2C: 94.7 ml LV vol d, MOD A4C: 110.0 ml LV vol s, MOD A2C: 38.5 ml LV vol s, MOD A4C: 44.3 ml LV SV MOD A2C:     56.2 ml LV SV MOD A4C:     110.0 ml LV SV MOD BP:      63.7 ml RIGHT VENTRICLE RV S prime:     15.60 cm/s TAPSE (M-mode): 2.0 cm LEFT ATRIUM             Index       RIGHT ATRIUM           Index LA diam:        1.60 cm 0.99 cm/m  RA Area:     15.70 cm LA Vol (A2C):   20.0 ml 12.34 ml/m RA Volume:   42.30 ml  26.06 ml/m LA Vol (A4C):   45.2 ml 27.85 ml/m LA Biplane Vol: 34.5 ml 21.26 ml/m  AORTIC VALVE LVOT Vmax:   106.00 cm/s LVOT Vmean:  84.000 cm/s LVOT VTI:    0.210 m  AORTA Ao Root diam: 2.90 cm MITRAL VALVE               TRICUSPID VALVE MV Area (PHT): 2.48 cm    TR Peak grad:   24.4 mmHg MV Decel Time: 306 msec    TR Vmax:        247.00 cm/s MV E velocity: 50.10 cm/s MV A velocity: 63.00 cm/s  SHUNTS MV E/A ratio:  0.80        Systemic VTI:  0.21 m                            Systemic Diam: 1.90 cm Weston Brass MD Electronically signed by Weston Brass MD Signature Date/Time: 02/27/2020/10:04:40 AM    Final    VAS [place] LOWER EXTREMITY VENOUS (DVT)  Result Date: 02/27/2020  Lower Venous DVT Study Indications: Edema.  Comparison Study: no prior Performing Technologist: Blanch Media RVS  Examination Guidelines: A complete evaluation includes B-mode imaging, spectral Doppler, color Doppler, and power Doppler as needed of all accessible portions of each vessel. Bilateral testing is considered an integral part of a complete examination. Limited examinations for reoccurring  indications may be performed as noted. The reflux portion of the exam is performed with the patient in reverse Trendelenburg.  +---------+---------------+---------+-----------+----------+-------------------+ RIGHT    CompressibilityPhasicitySpontaneityPropertiesThrombus Aging      +---------+---------------+---------+-----------+----------+-------------------+ CFV      Full           Yes      Yes                                      +---------+---------------+---------+-----------+----------+-------------------+ SFJ      Full                                                             +---------+---------------+---------+-----------+----------+-------------------+ FV Prox  Full                                                             +---------+---------------+---------+-----------+----------+-------------------+ FV Mid   Full                                                             +---------+---------------+---------+-----------+----------+-------------------+ FV DistalFull                                                             +---------+---------------+---------+-----------+----------+-------------------+ PFV      Full                                                             +---------+---------------+---------+-----------+----------+-------------------+  POP      Full           Yes      Yes                                      +---------+---------------+---------+-----------+----------+-------------------+ PTV      Full                                                             +---------+---------------+---------+-----------+----------+-------------------+ PERO                                                  Not well visualized +---------+---------------+---------+-----------+----------+-------------------+   +---------+---------------+---------+-----------+----------+-------------------+ LEFT      CompressibilityPhasicitySpontaneityPropertiesThrombus Aging      +---------+---------------+---------+-----------+----------+-------------------+ CFV      Full           Yes      Yes                                      +---------+---------------+---------+-----------+----------+-------------------+ SFJ      Full                                                             +---------+---------------+---------+-----------+----------+-------------------+ FV Prox  Full                                                             +---------+---------------+---------+-----------+----------+-------------------+ FV Mid   Full                                                             +---------+---------------+---------+-----------+----------+-------------------+ FV DistalFull                                                             +---------+---------------+---------+-----------+----------+-------------------+ PFV      Full                                                             +---------+---------------+---------+-----------+----------+-------------------+ POP      Full  Yes      Yes                                      +---------+---------------+---------+-----------+----------+-------------------+ PTV      Full                                                             +---------+---------------+---------+-----------+----------+-------------------+ PERO                                                  Not well visualized +---------+---------------+---------+-----------+----------+-------------------+     *See table(s) above for measurements and observations.    Preliminary    Scheduled Meds: . aspirin  81 mg Oral Daily  . donepezil  10 mg Oral Daily  . heparin  5,000 Units Subcutaneous Q12H  . lidocaine  1 patch Transdermal Q24H  . multivitamin with minerals   Oral Daily  . senna-docusate  1 tablet Oral Daily   Continuous  Infusions: . cefTRIAXone (ROCEPHIN)  IV       LOS: 1 day    Time spent: 35 mins    Vuong Musa, MD [facility]   If 7PM-7AM, please contact night-coverage

## 2020-02-27 NOTE — ED Notes (Signed)
This Clinical research associate spoke with the daughter and gave her an update on pt status. 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

## 2020-02-27 NOTE — Progress Notes (Signed)
  Echocardiogram 2D Echocardiogram with definity has been performed.  Leta Jungling M 02/27/2020, 9:20 AM

## 2020-02-27 NOTE — ED Notes (Signed)
[name] made paged about manual BP.

## 2020-02-27 NOTE — Consult Note (Signed)
NAME:  [name], MRN:  [id number], DOB:  [birth year], LOS: 1 ADMISSION DATE:  02/26/2020, CONSULTATION DATE:  02/27/20 REFERRING MD:  Loney Loh TRH, CHIEF COMPLAINT:  Hypotension  Brief History   79 yo F admitted to [facility] with AKI and UTI. Hx recurring UTIs. Overnight in ED, hypotensive. SBPs 80s after 3L IVF, Asymptomatic   History of present illness   Hx per chart review  (discussed with patient however not sure that patient is accurate historian)  79 yo F PMH recurrent UTIs (pansensitive e.coli approx 6 weeks ago), HLD, mild dementia who presented to ED 11/15 with weakness and frequent falls. These symptoms began approx 2 months ago and have gradually worsened.  The patient notably presented to ED 10/30 with near syncope, striking head and having transient AMS -- CT H and C spine without acute abnormality at that time. Since that time, the patient has had home SBP often 70-80s.   In ED 11/15, the patients CT H also was without acute abnormality. Labs significant for AKI (Cr 2.04) and UA with many bacteria, >50 WBC.  With hx ecoli UTI, started on rocephin, and given IVF for AKI. Overnight, pt with perisistantly soft SBPs in 80s in ED. The patient has received 3L IVF, and is without symptoms of hypotension.  PCCM consulted in this setting   Past Medical History  UTI  HLD Dementia Anxiety Depression  Significant Hospital Events   11/15 admitted to [facility] 11/16 PCCM consult for hypotension   Consults:  PCCM  Procedures:    Significant Diagnostic Tests:  11/15 CT H > no acute intracranial abnormality 11/15 Renal US> no hydronephrosis    Micro Data:  11/15 BCx>> 11/15 UCx>>  Antimicrobials:  11/15 Rocephin >>   Interim history/subjective:  SBPs high 80s in ED. Has received 2.5 L NS boluses since admission + maintenance 170ml/hr NS  Has not had further IVF bolus since approx 0000   Objective   Blood pressure (!) 88/66, pulse (!) 58, temperature 98 F (36.7 C), resp.  rate 15, SpO2 100 %.        Intake/Output Summary (Last 24 hours) at 02/27/2020 0158 Last data filed at 02/26/2020 1409 Gross per 24 hour  Intake 400 ml  Output --  Net 400 ml   There were no vitals filed for this visit.  Examination: General: Chronically ill, frail elderly F, supine in bed NAD HENT: NCAT pink mm trachea midline anicteric sclera Lungs: CTAb symmetrical chest expansion. Even and unlabored on RA Cardiovascular: rrr s1s2. Cap refill brisk Abdomen: soft round ndnt Extremities: Symmetrical. No cyanosis or clubbing Neuro: Awake alert oriented x3 following commands.  GU: defer   Resolved Hospital Problem list     Assessment & Plan:   Hypotension -asymptomatic. Recently home SBPs 70-80s x 2 weeks  -has had 2.5L IVF bolus + maintenance IVF -UTI but does not have systemic findings c/w septic shock  P -discussed with PCCM MD -- ok for SDU. No indication for pressors  -continue IVF. Would recommend switching to LR with AKI -goal MAP 65  -repeat LA   AKI -IVF  -trend renal indices   UTI -ceftriaxone per primary, follow cx data -trend WBC, fever curve   Myalgia -describes non-specific mild myalgias BLE > BUE  P -dc statin  -check CK  Tension HA -lido patch  -APAP  Thank you for consulting PCCM. At this time we will sign off. Please re-engage if we can be of further assistance or if patient's clinical  status changes.    Best practice:  Diet: thin liquid Pain/Anxiety/Delirium protocol (if indicated): APAP VAP protocol (if indicated): na DVT prophylaxis: heparin GI prophylaxis: na Glucose control: monitor Mobility: Assist Code Status: full  Family Communication: per primary  Disposition: SDU   Labs   CBC: Recent Labs  Lab 02/26/20 1520  WBC 10.1  NEUTROABS 8.2*  HGB 10.4*  HCT 33.9*  MCV 93.4  PLT 249    Basic Metabolic Panel: Recent Labs  Lab 02/26/20 1520  NA 138  K 4.1  CL 101  CO2 24  GLUCOSE 120*  BUN 34*  CREATININE  2.04*  CALCIUM 10.2   GFR: CrCl cannot be calculated (Unknown ideal weight.). Recent Labs  Lab 02/26/20 1520  WBC 10.1  LATICACIDVEN 1.1    Liver Function Tests: Recent Labs  Lab 02/26/20 1520  AST 76*  ALT 42  ALKPHOS 63  BILITOT 0.8  PROT 7.1  ALBUMIN 3.7   No results for input(s): LIPASE, AMYLASE in the last 168 hours. No results for input(s): AMMONIA in the last 168 hours.  ABG No results found for: PHART, PCO2ART, PO2ART, HCO3, TCO2, ACIDBASEDEF, O2SAT   Coagulation Profile: No results for input(s): INR, PROTIME in the last 168 hours.  Cardiac Enzymes: No results for input(s): CKTOTAL, CKMB, CKMBINDEX, TROPONINI in the last 168 hours.  HbA1C: Hemoglobin A1C  Date/Time Value Ref Range Status  05/01/2012 11:19 AM 5.8  Final    CBG: No results for input(s): GLUCAP in the last 168 hours.  Review of Systems:   + HA + dizziness  +weakness + fatigue + myalgia   Remaining ROS performed and negative  Past Medical History  She,  has a past medical history of Anemia, Anxiety, Arthritis, Chronic bronchitis (HCC), Depression, GERD (gastroesophageal reflux disease), H/O: pneumonia (2003), Hyperlipidemia, Hypertension, Morbid obesity with BMI of 45.0-49.9, adult (HCC), Shingles, and Thoracic aortic atherosclerosis (HCC).   Surgical History    Past Surgical History:  Procedure Laterality Date  . CARDIAC CATHETERIZATION  04/20/1996   patent coronaries, EF 74% (Dr. Aram Candela)  . CARPAL TUNNEL RELEASE Left   . CATARACT EXTRACTION, BILATERAL    . COLONOSCOPY    . KNEE SURGERY Left 11/2001  . NM MYOVIEW LTD  06/17/2018   EF 60-65 %.  Apical thinning.  LOW RISK no ischemia or infarction.  . REPLACEMENT TOTAL KNEE Left 10/2009  . REPLACEMENT TOTAL KNEE Left 10/2009  . SHOULDER OPEN ROTATOR CUFF REPAIR  03/01/2012   Procedure: ROTATOR CUFF REPAIR SHOULDER OPEN;  Surgeon: Mable Paris, MD;  Location: [facility] OR;  Service: Orthopedics;  Laterality: Left;  SUBSCAPULARIS  REPAIR  . SVD     x 7  . TOTAL HIP ARTHROPLASTY Right 07/24/2016  . TOTAL HIP ARTHROPLASTY Right 07/24/2016   Procedure: RIGHT TOTAL HIP ARTHROPLASTY ANTERIOR APPROACH;  Surgeon: [name], MD;  Location: [facility] OR;  Service: Orthopedics;  Laterality: Right;  . TOTAL HIP ARTHROPLASTY Left 04/09/2017   Procedure: LEFT TOTAL HIP ARTHROPLASTY ANTERIOR APPROACH;  Surgeon: [name], MD;  Location: [facility] ORS;  Service: Orthopedics;  Laterality: Left;  . TOTAL SHOULDER ARTHROPLASTY  01/18/2012   Procedure: TOTAL SHOULDER ARTHROPLASTY;  Surgeon: Mable Paris, MD;  Location: [facility] OR;  Service: Orthopedics;  Laterality: Left;  . TOTAL SHOULDER REPLACEMENT Left 01/19/2012  . TRANSTHORACIC ECHOCARDIOGRAM  06/16/2018   EF 60 to 65%.  GR 1 DD.  Normal mitral valve.  Mild aortic sclerosis but no stenosis.  . TUBAL LIGATION    .  WISDOM TOOTH EXTRACTION       Social History   reports that she quit smoking about 36 years ago. Her smoking use included cigarettes. She has a 25.00 pack-year smoking history. She has quit using smokeless tobacco. She reports that she does not drink alcohol and does not use drugs.   Family History   Her family history includes Heart disease in her father, mother, sister, sister, and sister; Hypertension in her sister, sister, and sister.   Allergies Allergies  Allergen Reactions  . Effexor [Venlafaxine Hcl] Swelling and Other (See Comments)    MD ADVISES PATIENT TO NOT TAKE IN FUTURE PT STOPPED, MD DC'd MED  . Sulfa Drugs Cross Reactors Itching     Home Medications  Prior to Admission medications   Medication Sig Start Date End Date Taking? Authorizing Provider  acetaminophen (TYLENOL) 500 MG tablet Take 500 mg by mouth every 6 (six) hours as needed for mild pain.   Yes [provider]  aspirin 81 MG chewable tablet Chew 81 mg by mouth daily.   Yes [provider]  donepezil (ARICEPT) 10 MG tablet Take 1 tablet daily Patient taking differently:  Take 10 mg by mouth daily. Take 1 tablet daily 01/26/20  Yes [name], MD  hydrOXYzine (ATARAX/VISTARIL) 25 MG tablet Take 1 tablet (25 mg total) by mouth 3 (three) times daily as needed. Patient taking differently: Take 25 mg by mouth 3 (three) times daily as needed for anxiety.  01/26/20  Yes [name], MD  Multiple Vitamins-Minerals (MULTI-VITAMIN GUMMIES PO) Take 1 tablet by mouth daily.    Yes [provider]  polyethylene glycol powder (GLYCOLAX/MIRALAX) 17 GM/SCOOP powder Mix 1 capful of powder in drink and take by mouth one to three times daily as needed for daily soft stools  OTC Patient taking differently: Take 0.5 Containers by mouth daily as needed for mild constipation.  12/13/18  Yes Little, Ambrose Finland, MD  rosuvastatin (CRESTOR) 40 MG tablet Take 1 tablet (40 mg total) by mouth daily. 01/26/20  Yes [name], MD  sennosides-docusate sodium (SENOKOT-S) 8.6-50 MG tablet Take 1 tablet by mouth daily.   Yes [provider]  trimethoprim (TRIMPEX) 100 MG tablet Take 1 tablet (100 mg total) by mouth daily. 12/14/19  Yes [name], MD  lisinopril (ZESTRIL) 10 MG tablet Take 1 tablet (10 mg total) by mouth daily. 12/14/19   [name], MD     Tessie Fass MSN, AGACNP-BC [facility] [phone] If no answer, [phone] 02/27/2020, 2:57 AM

## 2020-02-27 NOTE — ED Notes (Signed)
Report to Sherry RN

## 2020-02-27 NOTE — Progress Notes (Signed)
Floor coverage  Patient continues to be hypotensive with systolic in the 80s and MAP <65 despite a total of 3 L fluid boluses plus maintenance fluid at 100 cc/h.  Somnolent but easily arousable.  She is getting treatment for a UTI but no signs of sepsis at this time.  No significant leukocytosis.  Lactic acid within normal limits.  Not febrile or tachycardic.  TSH and cortisol levels within normal limits.  Discussed the case with Dr. Jayme Cloud, PCCM will consult.  Patient is being transferred to the ICU for close monitoring.

## 2020-02-27 NOTE — ED Notes (Signed)
Provided pt with ample food and drink to correct hypoglycemia. Will reassess

## 2020-02-27 NOTE — ED Notes (Signed)
Pt resting in bed. NADN 

## 2020-02-27 NOTE — Progress Notes (Signed)
PT Cancellation Note  Patient Details Name: LEDONNA DORMER MRN: [id number] DOB: 1940/12/30   Cancelled Treatment:    Reason Eval/Treat Not Completed: Other (comment) Pt currently resting and pt's family requesting to allow her sleep. Will follow up as schedule allows.   Cindee Salt, DPT  Acute Rehabilitation Services  Pager: [phone] Office: [phone]    Lehman Prom 02/27/2020, 12:28 PM

## 2020-02-27 NOTE — ED Notes (Signed)
First contact. Change of shift. Pt reoriented to event and place. Readjusted for comfort. Heels floated. NADN.

## 2020-02-27 NOTE — Progress Notes (Signed)
Lower extremity venous has been completed.   Preliminary results in CV Proc.   Blanch Media 02/27/2020 9:31 AM

## 2020-02-27 NOTE — ED Notes (Signed)
Alert  Comfortable  Daughter at bedside

## 2020-02-28 ENCOUNTER — Inpatient Hospital Stay: Payer: MEDICARE | Admitting: Physician Assistant

## 2020-02-28 ENCOUNTER — Other Ambulatory Visit: Payer: Self-pay

## 2020-02-28 DIAGNOSIS — N3001 Acute cystitis with hematuria: Secondary | ICD-10-CM

## 2020-02-28 DIAGNOSIS — I959 Hypotension, unspecified: Secondary | ICD-10-CM | POA: Diagnosis not present

## 2020-02-28 DIAGNOSIS — N179 Acute kidney failure, unspecified: Secondary | ICD-10-CM | POA: Diagnosis not present

## 2020-02-28 LAB — CBC
HCT: 30.8 % — ABNORMAL LOW (ref 36.0–46.0)
Hemoglobin: 9.4 g/dL — ABNORMAL LOW (ref 12.0–15.0)
MCH: 29.2 pg (ref 26.0–34.0)
MCHC: 30.5 g/dL (ref 30.0–36.0)
MCV: 95.7 fL (ref 80.0–100.0)
Platelets: 205 10*3/uL (ref 150–400)
RBC: 3.22 MIL/uL — ABNORMAL LOW (ref 3.87–5.11)
RDW: 16.6 % — ABNORMAL HIGH (ref 11.5–15.5)
WBC: 7 10*3/uL (ref 4.0–10.5)
nRBC: 0 % (ref 0.0–0.2)

## 2020-02-28 LAB — BASIC METABOLIC PANEL WITH GFR
Anion gap: 9 (ref 5–15)
BUN: 20 mg/dL (ref 8–23)
CO2: 18 mmol/L — ABNORMAL LOW (ref 22–32)
Calcium: 8.9 mg/dL (ref 8.9–10.3)
Chloride: 114 mmol/L — ABNORMAL HIGH (ref 98–111)
Creatinine, Ser: 1.48 mg/dL — ABNORMAL HIGH (ref 0.44–1.00)
GFR, Estimated: 36 mL/min — ABNORMAL LOW
Glucose, Bld: 88 mg/dL (ref 70–99)
Potassium: 3.5 mmol/L (ref 3.5–5.1)
Sodium: 141 mmol/L (ref 135–145)

## 2020-02-28 LAB — MAGNESIUM: Magnesium: 2.1 mg/dL (ref 1.7–2.4)

## 2020-02-28 LAB — CK: Total CK: 2192 U/L — ABNORMAL HIGH (ref 38–234)

## 2020-02-28 LAB — PHOSPHORUS: Phosphorus: 2.1 mg/dL — ABNORMAL LOW (ref 2.5–4.6)

## 2020-02-28 MED ORDER — POTASSIUM CHLORIDE IN NACL 20-0.9 MEQ/L-% IV SOLN
INTRAVENOUS | Status: DC
  Filled 2020-02-28 (×4): qty 1000

## 2020-02-28 MED ORDER — POTASSIUM CHLORIDE 20 MEQ PO PACK
20.0000 meq | PACK | Freq: Once | ORAL | Status: AC
  Administered 2020-02-28: 20 meq via ORAL
  Filled 2020-02-28: qty 1

## 2020-02-28 MED ORDER — MIDODRINE HCL 5 MG PO TABS
5.0000 mg | ORAL_TABLET | Freq: Three times a day (TID) | ORAL | Status: DC
  Administered 2020-02-28 – 2020-03-01 (×8): 5 mg via ORAL
  Filled 2020-02-28 (×6): qty 1

## 2020-02-28 NOTE — Progress Notes (Signed)
PROGRESS NOTE    Sherilyn BankerBernice W Jaye  ZOX:[id number] DOB: 1940/09/11 DOA: 02/26/2020 PCP: Patient, No Pcp Per   Brief Narrative:  This 79 years old female with medical history significant for hypertension, dementia, hyperlipidemia presents in the emergency department with generalized weakness and frequent falls.  History is obtained from daughter, she reports symptoms for last few months and progressively getting worse.  Patient has poor appetite, chronically low blood pressure for few months.  Daughter reports similar episode 6 weeks ago,  found to have a UTI and acute kidney injury.  she was discharged on Keflex after kidney function slightly improved. Patient is admitted for hypotension, acute kidney injury, generalized weakness secondary to recurrent UTI and started on  IV. antibiotics.  Subjective:  She does report generalized weakness, she denies any chest pain, shortness of breath, fever or chills.  Assessment & Plan:   Active Problems:   AKI (acute kidney injury) (HCC)   Hypotension   Hypotension: -Daughter reports she remains with chronically low blood pressure. -Lactic acid , white count within normal limits, appears to be euvolemic.    -Doubt patient septic although she might have a recurrent UTI. -Patient has received 3 L of IV fluids blood pressure  still remained low. -PCCM consulted blood pressure is slightly improved.  PCCM recommended continue monitoring in progressive. -Echocardiogram unremarkable EF 55 consistent grade 1 diastolic dysfunction. -   TSH and cortisol level normal -blood pressure on the lower side, he will be started on Midodrin  AKI >>> improving -Renal ultrasound unremarkable -Check uric acid -improving with IV fluids  Generalized weakness -Treat hypotension and UTI,  - PT/OT evaluation  Recurrent UTI -Most recent urine culture showed pansensitive E. coli, will continue Rocephin for now. Follow-up blood and urine cultures  Peripheral  edema -Venous duplex completed report pending.  Elevated CK: This could be due to fall -Total CK trending down, continue with current dose of IV fluids, continue to trend closely to ensure it is trending down.   DVT prophylaxis: Heparin subcu Code Status: Full code Family Communication: No one at bedside Disposition Plan:  Status is: Inpatient  Remains inpatient appropriate because:Hemodynamically unstable and Inpatient level of care appropriate due to severity of illness   Dispo: The patient is from: Home              Anticipated d/c is to: SNF              Anticipated d/c date is: 2 days              Patient currently is not medically stable to d/c.   Consultants:    PCCM  Procedures:  Antimicrobials:  Anti-infectives (From admission, onward)   Start     Dose/Rate Route Frequency Ordered Stop   02/27/20 2000  cefTRIAXone (ROCEPHIN) 1 g in sodium chloride 0.9 % 100 mL IVPB        1 g 200 mL/hr over 30 Minutes Intravenous Every 24 hours 02/26/20 1912 03/03/20 1959   02/26/20 1900  cefTRIAXone (ROCEPHIN) 1 g in sodium chloride 0.9 % 100 mL IVPB        1 g 200 mL/hr over 30 Minutes Intravenous  Once 02/26/20 1856 02/26/20 2107       Objective: Vitals:   02/28/20 0415 02/28/20 0858 02/28/20 0951 02/28/20 1220  BP: (!) 88/51 (!) 78/50 100/62 (!) 88/58  Pulse: 73 72  69  Resp: 19 18  14   Temp: 97.8 F (36.6 C)   98.4  F (36.9 C)  TempSrc: Oral   Oral  SpO2: 100% 99%      Intake/Output Summary (Last 24 hours) at 02/28/2020 1425 Last data filed at 02/28/2020 2993 Gross per 24 hour  Intake 120 ml  Output 1700 ml  Net -1580 ml   There were no vitals filed for this visit.  Examination:  Awake Alert, Oriented X 3, frail, no new F.N deficits, Normal affect Symmetrical Chest wall movement, Good air movement bilaterally, CTAB RRR,No Gallops,Rubs or new Murmurs, No Parasternal Heave +ve B.Sounds, Abd Soft, No tenderness, No rebound - guarding or rigidity. No  Cyanosis, Clubbing or edema, No new Rash or bruise     Data Reviewed: I have personally reviewed following labs and imaging studies  CBC: Recent Labs  Lab 02/26/20 1520 02/28/20 0159  WBC 10.1 7.0  NEUTROABS 8.2*  --   HGB 10.4* 9.4*  HCT 33.9* 30.8*  MCV 93.4 95.7  PLT 249 205   Basic Metabolic Panel: Recent Labs  Lab 02/26/20 1520 02/27/20 0316 02/28/20 0159  NA 138 143 141  K 4.1 3.5 3.5  CL 101 113* 114*  CO2 24 21* 18*  GLUCOSE 120* 94 88  BUN 34* 27* 20  CREATININE 2.04* 1.76* 1.48*  CALCIUM 10.2 8.6* 8.9  MG  --   --  2.1  PHOS  --   --  2.1*   GFR: CrCl cannot be calculated (Unknown ideal weight.). Liver Function Tests: Recent Labs  Lab 02/26/20 1520  AST 76*  ALT 42  ALKPHOS 63  BILITOT 0.8  PROT 7.1  ALBUMIN 3.7   No results for input(s): LIPASE, AMYLASE in the last 168 hours. No results for input(s): AMMONIA in the last 168 hours. Coagulation Profile: No results for input(s): INR, PROTIME in the last 168 hours. Cardiac Enzymes: Recent Labs  Lab 02/27/20 0316 02/28/20 0159  CKTOTAL 1,775* 2,192*   BNP (last 3 results) No results for input(s): PROBNP in the last 8760 hours. HbA1C: No results for input(s): HGBA1C in the last 72 hours. CBG: Recent Labs  Lab 02/27/20 0808 02/27/20 0936 02/27/20 2225  GLUCAP 59* 74 103*   Lipid Profile: No results for input(s): CHOL, HDL, LDLCALC, TRIG, CHOLHDL, LDLDIRECT in the last 72 hours. Thyroid Function Tests: Recent Labs    02/26/20 1909  TSH 0.644   Anemia Panel: No results for input(s): VITAMINB12, FOLATE, FERRITIN, TIBC, IRON, RETICCTPCT in the last 72 hours. Sepsis Labs: Recent Labs  Lab 02/26/20 1520 02/27/20 0316  LATICACIDVEN 1.1 0.8    Recent Results (from the past 240 hour(s))  Respiratory Panel by RT PCR (Flu A&B, Covid) - Nasopharyngeal Swab     Status: None   Collection Time: 02/26/20  2:32 PM   Specimen: Nasopharyngeal Swab  Result Value Ref Range Status   SARS  Coronavirus 2 by RT PCR NEGATIVE NEGATIVE Final    Comment: (NOTE) SARS-CoV-2 target nucleic acids are NOT DETECTED.  The SARS-CoV-2 RNA is generally detectable in upper respiratoy specimens during the acute phase of infection. The lowest concentration of SARS-CoV-2 viral copies this assay can detect is 131 copies/mL. A negative result does not preclude SARS-Cov-2 infection and should not be used as the sole basis for treatment or other patient management decisions. A negative result may occur with  improper specimen collection/handling, submission of specimen other than nasopharyngeal swab, presence of viral mutation(s) within the areas targeted by this assay, and inadequate number of viral copies (<131 copies/mL). A negative result must be combined  with clinical observations, patient history, and epidemiological information. The expected result is Negative.  Fact Sheet for Patients:  https://www.moore.com/  Fact Sheet for Healthcare Providers:  https://www.young.biz/  This test is no t yet approved or cleared by the [place] FDA and  has been authorized for detection and/or diagnosis of SARS-CoV-2 by FDA under an Emergency Use Authorization (EUA). This EUA will remain  in effect (meaning this test can be used) for the duration of the COVID-19 declaration under Section 564(b)(1) of the Act, 21 U.S.C. section 360bbb-3(b)(1), unless the authorization is terminated or revoked sooner.     Influenza A by PCR NEGATIVE NEGATIVE Final   Influenza B by PCR NEGATIVE NEGATIVE Final    Comment: (NOTE) The Xpert Xpress SARS-CoV-2/FLU/RSV assay is intended as an aid in  the diagnosis of influenza from Nasopharyngeal swab specimens and  should not be used as a sole basis for treatment. Nasal washings and  aspirates are unacceptable for Xpert Xpress SARS-CoV-2/FLU/RSV  testing.  Fact Sheet for  Patients: https://www.moore.com/  Fact Sheet for Healthcare Providers: https://www.young.biz/  This test is not yet approved or cleared by the [place] FDA and  has been authorized for detection and/or diagnosis of SARS-CoV-2 by  FDA under an Emergency Use Authorization (EUA). This EUA will remain  in effect (meaning this test can be used) for the duration of the  Covid-19 declaration under Section 564(b)(1) of the Act, 21  U.S.C. section 360bbb-3(b)(1), unless the authorization is  terminated or revoked. Performed at [facility], [address], [place], [place] [address]   Culture, blood (routine x 2)     Status: None (Preliminary result)   Collection Time: 02/26/20  3:30 PM   Specimen: BLOOD  Result Value Ref Range Status   Specimen Description BLOOD SITE NOT SPECIFIED  Final   Special Requests   Final    BOTTLES DRAWN AEROBIC AND ANAEROBIC Blood Culture adequate volume   Culture   Final    NO GROWTH 2 DAYS Performed at [facility], [address] [place], [place] [address]    Report Status PENDING  Incomplete  Culture, blood (routine x 2)     Status: None (Preliminary result)   Collection Time: 02/26/20  5:20 PM   Specimen: BLOOD RIGHT ARM  Result Value Ref Range Status   Specimen Description BLOOD RIGHT ARM  Final   Special Requests   Final    BOTTLES DRAWN AEROBIC AND ANAEROBIC Blood Culture adequate volume   Culture   Final    NO GROWTH 2 DAYS Performed at [facility], [address], [place], [place] [address]    Report Status PENDING  Incomplete  Urine culture     Status: Abnormal (Preliminary result)   Collection Time: 02/26/20  6:15 PM   Specimen: Urine, Random  Result Value Ref Range Status   Specimen Description URINE, RANDOM  Final   Special Requests NONE  Final   Culture (A)  Final    >=100,000 COLONIES/mL ESCHERICHIA COLI SUSCEPTIBILITIES TO FOLLOW Performed at [facility],  [address], [place], [place] [address]    Report Status PENDING  Incomplete         Radiology Studies: CT Head Wo Contrast  Result Date: 02/26/2020 CLINICAL DATA:  Fall EXAM: CT HEAD WITHOUT CONTRAST TECHNIQUE: Contiguous axial images were obtained from the base of the skull through the vertex without intravenous contrast. COMPARISON:  02/10/2020 FINDINGS: Brain: There is atrophy and  chronic small vessel disease changes. No acute intracranial abnormality. Specifically, no hemorrhage, hydrocephalus, mass lesion, acute infarction, or significant intracranial injury. Vascular: No hyperdense vessel or unexpected calcification. Skull: No acute calvarial abnormality. Sinuses/Orbits: Visualized paranasal sinuses and mastoids clear. Orbital soft tissues unremarkable. Other: None IMPRESSION: Atrophy, chronic microvascular disease. No acute intracranial abnormality. Electronically Signed   By: Charlett Nose M.D.   On: 02/26/2020 15:08   US RENAL  Result Date: 02/26/2020 CLINICAL DATA:  Acute renal insufficiency EXAM: RENAL / URINARY TRACT ULTRASOUND COMPLETE COMPARISON:  None. FINDINGS: Right Kidney: Renal measurements: 8.3 x 4.4 x 5.0 cm = volume: 101 mL. Echogenicity within normal limits. No mass or hydronephrosis visualized. Three simple cortical cysts are seen scattered throughout the right kidney. Left Kidney: Renal measurements: 9.0 x 5.2 x 5.4 cm = volume: 134 mL. There is limited visualization of the lower pole of the left kidney due to overlying bowel gas. Echogenicity within normal limits. No mass or hydronephrosis visualized. Bladder: Appears normal for degree of bladder distention. Other: None. IMPRESSION: Slightly limited but unremarkable examination of the kidneys. Electronically Signed   By: Helyn Numbers MD   On: 02/26/2020 23:07   DG Chest Port 1 View  Result Date: 02/26/2020 CLINICAL DATA:  79 year old female with weakness and fall to the floor. EXAM: PORTABLE CHEST 1 VIEW COMPARISON:   Chest radiographs 06/16/2018 and earlier. FINDINGS: Portable AP semi upright view at 1439 hours. Stable cardiomegaly and tortuous thoracic aorta. Other mediastinal contours are within normal limits. Lung volumes are within normal limits. Allowing for portable technique the lungs are clear. No pneumothorax. Visualized tracheal air column is within normal limits. Stable visualized osseous structures, including advanced degenerative changes at the shoulders and previous left shoulder arthroplasty. Negative visible bowel gas pattern. IMPRESSION: No acute cardiopulmonary abnormality or acute traumatic injury identified. Electronically Signed   By: Odessa Fleming M.D.   On: 02/26/2020 14:48   ECHOCARDIOGRAM COMPLETE  Result Date: 02/27/2020    ECHOCARDIOGRAM REPORT   Patient Name:   MYANA SCHLUP Date of Exam: 02/27/2020 Medical Rec #:  [id number]      Height:       61.0 in Accession #:    [id number]     Weight:       140.0 lb Date of Birth:  Feb 09, 1941      BSA:          1.623 m Patient Age:    79 years       BP:           80/52 mmHg Patient Gender: F              HR:           63 bpm. Exam Location:  Inpatient Procedure: 2D Echo and Intracardiac Opacification Agent Indications:    Murmur 785.2 / R01.1  History:        Patient has prior history of Echocardiogram examinations, most                 recent 06/16/2018. Signs/Symptoms:dementia; Risk                 Factors:Hypertension and Dyslipidemia. 6 weeks ago had a near                 syncopal event.  Sonographer:    Leta Jungling RDCS Referring Phys: [id number] Emeline General IMPRESSIONS  1. Left ventricular ejection fraction, by estimation, is 55%. The left ventricle has normal function. The  left ventricle has no regional wall motion abnormalities. There is mild asymmetric left ventricular hypertrophy of the basal-septal segment. Left ventricular diastolic parameters are consistent with Grade I diastolic dysfunction (impaired relaxation).  2. Right ventricular systolic  function is normal. The right ventricular size is mildly enlarged. There is normal pulmonary artery systolic pressure. The estimated right ventricular systolic pressure is 27.4 mmHg.  3. The mitral valve is normal in structure. No evidence of mitral valve regurgitation. No evidence of mitral stenosis.  4. The aortic valve is normal in structure. Aortic valve regurgitation is trivial. No aortic stenosis is present.  5. The inferior vena cava is normal in size with greater than 50% respiratory variability, suggesting right atrial pressure of 3 mmHg. FINDINGS  Left Ventricle: Left ventricular ejection fraction, by estimation, is 55%. The left ventricle has normal function. The left ventricle has no regional wall motion abnormalities. Definity contrast agent was given IV to delineate the left ventricular endocardial borders. The left ventricular internal cavity size was normal in size. There is mild asymmetric left ventricular hypertrophy of the basal-septal segment. Left ventricular diastolic parameters are consistent with Grade I diastolic dysfunction (impaired relaxation). Right Ventricle: The right ventricular size is mildly enlarged. No increase in right ventricular wall thickness. Right ventricular systolic function is normal. There is normal pulmonary artery systolic pressure. The tricuspid regurgitant velocity is 2.47  m/s, and with an assumed right atrial pressure of 3 mmHg, the estimated right ventricular systolic pressure is 27.4 mmHg. Left Atrium: Left atrial size was normal in size. Right Atrium: Right atrial size was normal in size. Pericardium: There is no evidence of pericardial effusion. Mitral Valve: The mitral valve is normal in structure. No evidence of mitral valve regurgitation. No evidence of mitral valve stenosis. Tricuspid Valve: The tricuspid valve is normal in structure. Tricuspid valve regurgitation is mild . No evidence of tricuspid stenosis. Aortic Valve: The aortic valve is normal in  structure. Aortic valve regurgitation is trivial. No aortic stenosis is present. Pulmonic Valve: The pulmonic valve was normal in structure. Pulmonic valve regurgitation is not visualized. No evidence of pulmonic stenosis. Aorta: The aortic root is normal in size and structure. Venous: The inferior vena cava is normal in size with greater than 50% respiratory variability, suggesting right atrial pressure of 3 mmHg. IAS/Shunts: No atrial level shunt detected by color flow Doppler.  LEFT VENTRICLE PLAX 2D LVIDd:         3.60 cm      Diastology LVIDs:         2.50 cm      LV e' medial:    5.44 cm/s LV PW:         0.90 cm      LV E/e' medial:  9.2 LV IVS:        1.10 cm      LV e' lateral:   7.18 cm/s LVOT diam:     1.90 cm      LV E/e' lateral: 7.0 LV SV:         60 LV SV Index:   37 LVOT Area:     2.84 cm  LV Volumes (MOD) LV vol d, MOD A2C: 94.7 ml LV vol d, MOD A4C: 110.0 ml LV vol s, MOD A2C: 38.5 ml LV vol s, MOD A4C: 44.3 ml LV SV MOD A2C:     56.2 ml LV SV MOD A4C:     110.0 ml LV SV MOD BP:      63.7  ml RIGHT VENTRICLE RV S prime:     15.60 cm/s TAPSE (M-mode): 2.0 cm LEFT ATRIUM             Index       RIGHT ATRIUM           Index LA diam:        1.60 cm 0.99 cm/m  RA Area:     15.70 cm LA Vol (A2C):   20.0 ml 12.34 ml/m RA Volume:   42.30 ml  26.06 ml/m LA Vol (A4C):   45.2 ml 27.85 ml/m LA Biplane Vol: 34.5 ml 21.26 ml/m  AORTIC VALVE LVOT Vmax:   106.00 cm/s LVOT Vmean:  84.000 cm/s LVOT VTI:    0.210 m  AORTA Ao Root diam: 2.90 cm MITRAL VALVE               TRICUSPID VALVE MV Area (PHT): 2.48 cm    TR Peak grad:   24.4 mmHg MV Decel Time: 306 msec    TR Vmax:        247.00 cm/s MV E velocity: 50.10 cm/s MV A velocity: 63.00 cm/s  SHUNTS MV E/A ratio:  0.80        Systemic VTI:  0.21 m                            Systemic Diam: 1.90 cm Weston Brass MD Electronically signed by Weston Brass MD Signature Date/Time: 02/27/2020/10:04:40 AM    Final    VAS [place] LOWER EXTREMITY VENOUS (DVT)  Result  Date: 02/27/2020  Lower Venous DVT Study Indications: Edema.  Comparison Study: no prior Performing Technologist: Blanch Media RVS  Examination Guidelines: A complete evaluation includes B-mode imaging, spectral Doppler, color Doppler, and power Doppler as needed of all accessible portions of each vessel. Bilateral testing is considered an integral part of a complete examination. Limited examinations for reoccurring indications may be performed as noted. The reflux portion of the exam is performed with the patient in reverse Trendelenburg.  +---------+---------------+---------+-----------+----------+-------------------+ RIGHT    CompressibilityPhasicitySpontaneityPropertiesThrombus Aging      +---------+---------------+---------+-----------+----------+-------------------+ CFV      Full           Yes      Yes                                      +---------+---------------+---------+-----------+----------+-------------------+ SFJ      Full                                                             +---------+---------------+---------+-----------+----------+-------------------+ FV Prox  Full                                                             +---------+---------------+---------+-----------+----------+-------------------+ FV Mid   Full                                                             +---------+---------------+---------+-----------+----------+-------------------+  FV DistalFull                                                             +---------+---------------+---------+-----------+----------+-------------------+ PFV      Full                                                             +---------+---------------+---------+-----------+----------+-------------------+ POP      Full           Yes      Yes                                      +---------+---------------+---------+-----------+----------+-------------------+ PTV      Full                                                              +---------+---------------+---------+-----------+----------+-------------------+ PERO                                                  Not well visualized +---------+---------------+---------+-----------+----------+-------------------+   +---------+---------------+---------+-----------+----------+-------------------+ LEFT     CompressibilityPhasicitySpontaneityPropertiesThrombus Aging      +---------+---------------+---------+-----------+----------+-------------------+ CFV      Full           Yes      Yes                                      +---------+---------------+---------+-----------+----------+-------------------+ SFJ      Full                                                             +---------+---------------+---------+-----------+----------+-------------------+ FV Prox  Full                                                             +---------+---------------+---------+-----------+----------+-------------------+ FV Mid   Full                                                             +---------+---------------+---------+-----------+----------+-------------------+ FV DistalFull                                                             +---------+---------------+---------+-----------+----------+-------------------+  PFV      Full                                                             +---------+---------------+---------+-----------+----------+-------------------+ POP      Full           Yes      Yes                                      +---------+---------------+---------+-----------+----------+-------------------+ PTV      Full                                                             +---------+---------------+---------+-----------+----------+-------------------+ PERO                                                  Not well visualized  +---------+---------------+---------+-----------+----------+-------------------+     *See table(s) above for measurements and observations.    Preliminary    Scheduled Meds: . aspirin  81 mg Oral Daily  . donepezil  10 mg Oral Daily  . heparin  5,000 Units Subcutaneous Q12H  . lidocaine  1 patch Transdermal Q24H  . midodrine  5 mg Oral TID WC  . multivitamin with minerals   Oral Daily  . senna-docusate  1 tablet Oral Daily   Continuous Infusions: . cefTRIAXone (ROCEPHIN)  IV 1 g (02/27/20 2125)     LOS: 2 days      Huey Bienenstock, MD [facility]   If 7PM-7AM, please contact night-coverage

## 2020-02-28 NOTE — Evaluation (Signed)
Physical Therapy Evaluation Patient Details Name: [name] MRN: [id number] DOB: [birth year] Today's Date: 02/28/2020   History of Present Illness  Pt presents to hospital with weakness and frequent falls. Pt found to have hypotension, AKI, and recurrent UTI. PMH - htn, dementia, THR, arthritis.  Clinical Impression  Pt presents to PT with poor mobility and requiring heavy assist to get to EOB and to stand. Unable to ambulate. Currently recommend ST-SNF for further rehab unless family can provide significant 24 hour assist at home.  Orthostatic BPs  Supine 78/50  Sitting 93/66  Standing 93/68       Follow Up Recommendations SNF;Supervision/Assistance - 24 hour    Equipment Recommendations  None recommended by PT    Recommendations for Other Services OT consult     Precautions / Restrictions Precautions Precautions: Fall;Other (comment) Precaution Comments: watch BP Restrictions Weight Bearing Restrictions: No      Mobility  Bed Mobility Overal bed mobility: Needs Assistance Bed Mobility: Supine to Sit     Supine to sit: Max assist;HOB elevated     General bed mobility comments: Assist to bring legs off of bed, elevate trunk into sitting, and bring hips to EOB.     Transfers Overall transfer level: Needs assistance Equipment used: 4-wheeled walker;Ambulation equipment used Transfers: Sit to/from Stand Sit to Stand: Mod assist;Max assist;From elevated surface         General transfer comment: Assist to bring hips up and for balance. Stood from bed with rollator but didn't feel pt could perform pivot to bed. Used Stedy for bed to chair.   Ambulation/Gait             General Gait Details: Unable  Stairs            Wheelchair Mobility    Modified Rankin (Stroke Patients Only)       Balance Overall balance assessment: Needs assistance Sitting-balance support: Bilateral upper extremity supported;Feet supported Sitting balance-Leahy  Scale: Poor Sitting balance - Comments: Sat EOB with UE support and min guard assist for static sitting   Standing balance support: Bilateral upper extremity supported Standing balance-Leahy Scale: Poor Standing balance comment: Mod assist for static standing x 1 minute                             Pertinent Vitals/Pain Pain Assessment: Faces Faces Pain Scale: Hurts a little bit Pain Location: feet Pain Descriptors / Indicators: Grimacing Pain Intervention(s): Repositioned    Home Living Family/patient expects to be discharged to:: Private residence Living Arrangements: Children Available Help at Discharge: Family;Available PRN/intermittently Type of Home: House       Home Layout: One level Home Equipment: Cane - single point;Walker - 4 wheels Additional Comments: Unsure of accuracy due to dementia. Pt states she lives with oldest daughter but that daughter works.     Prior Function Level of Independence: Needs assistance   Gait / Transfers Assistance Needed: Pt reports she walks with cane at home  ADL's / Homemaking Assistance Needed: Pt reports assist from daughter  Comments: Questionable accuracy due to dementia. Reports she had to call daughter a lot to help her up     Hand Dominance   Dominant Hand: Right    Extremity/Trunk Assessment   Upper Extremity Assessment Upper Extremity Assessment: Defer to OT evaluation    Lower Extremity Assessment Lower Extremity Assessment: Generalized weakness       Communication   Communication: No difficulties  Cognition Arousal/Alertness: Awake/alert (kept eyes closed much of time but was awake) Behavior During Therapy: WFL for tasks assessed/performed Overall Cognitive Status: No family/caregiver present to determine baseline cognitive functioning                                 General Comments: Pt with dementia. Follows one step commands. Oriented to person only. Eyes closed during most of  session but would open them when asked.      General Comments General comments (skin integrity, edema, etc.): See assessment or vitals flowsheet for orthostatics.    Exercises     Assessment/Plan    PT Assessment Patient needs continued PT services  PT Problem List Decreased strength;Decreased activity tolerance;Decreased balance;Decreased mobility;Decreased cognition       PT Treatment Interventions DME instruction;Gait training;Functional mobility training;Therapeutic activities;Therapeutic exercise;Balance training;Patient/family education    PT Goals (Current goals can be found in the Care Plan section)  Acute Rehab PT Goals Patient Stated Goal: not stated PT Goal Formulation: With patient Time For Goal Achievement: 03/13/20 Potential to Achieve Goals: Fair    Frequency Min 3X/week   Barriers to discharge Decreased caregiver support Unsure if pt has 24 hour assist available    Co-evaluation               AM-PAC PT "6 Clicks" Mobility  Outcome Measure Help needed turning from your back to your side while in a flat bed without using bedrails?: A Lot Help needed moving from lying on your back to sitting on the side of a flat bed without using bedrails?: A Lot Help needed moving to and from a bed to a chair (including a wheelchair)?: Total Help needed standing up from a chair using your arms (e.g., wheelchair or bedside chair)?: A Lot Help needed to walk in hospital room?: Total Help needed climbing 3-5 steps with a railing? : Total 6 Click Score: 9    End of Session Equipment Utilized During Treatment: Gait belt Activity Tolerance: Patient limited by fatigue Patient left: in chair;with call bell/phone within reach;with chair alarm set Nurse Communication: Mobility status;Need for lift equipment PT Visit Diagnosis: Other abnormalities of gait and mobility (R26.89);History of falling (Z91.81);Muscle weakness (generalized) (M62.81)    Time: 8338-2505 PT Time  Calculation (min) (ACUTE ONLY): 36 min   Charges:   PT Evaluation $PT Eval Moderate Complexity: 1 Mod PT Treatments $Therapeutic Activity: 8-22 mins        [facility] PT Acute Rehabilitation Services Pager [phone] Office [phone]   Angelina Ok [facility] 02/28/2020, 10:58 AM

## 2020-02-28 NOTE — TOC Initial Note (Signed)
Transition of Care [facility]) - Initial/Assessment Note    Patient Details  Name: [name] MRN: [id number] Date of Birth: 01-Feb-1941  Transition of Care [facility]) CM/SW Contact:    Lorri Frederick, LCSW Phone Number: 02/28/2020, 1:58 PM  Clinical Narrative:   CSW spoke with pt regarding discharge plan.  Pt agreeable to go to SNF and said she has been to [facility] several times before.  Permission given to speak to SNF, [facility], and to daughter Angelita.  Pt is vaccinated for covid.  CSW spoke with pt daughter Carroll Sage, who does not want pt to go to [facility] and agreed to speak with pt about choice when she comes to visit after work.  Choice document given to pt and daughter will look at it tonight.                 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" CMS Medicare.gov Compare Post Acute Care list provided to:: Patient Choice offered to / list presented to : Patient  Expected Discharge Plan and Services Expected Discharge Plan: Skilled Nursing Facility     Post Acute Care Choice: Skilled Nursing Facility Living arrangements for the past 2 months: Single Family Home                                      Prior Living Arrangements/Services Living arrangements for the past 2 months: Single Family Home Lives with:: Adult Children Patient language and need for interpreter reviewed:: Yes Do you feel safe going back to the place where you live?: Yes      Need for Family Participation in Patient Care: Yes (Comment) Care giver support system in place?: Yes (comment)   Criminal Activity/Legal Involvement Pertinent to Current Situation/Hospitalization: No - Comment as needed  Activities of Daily Living Home Assistive Devices/Equipment:  None ADL Screening (condition at time of admission) Patient's cognitive ability adequate to safely complete daily activities?: No Is the patient deaf or have difficulty hearing?: No Does the patient have difficulty seeing, even when wearing glasses/contacts?: No Does the patient have difficulty concentrating, remembering, or making decisions?: Yes Patient able to express need for assistance with ADLs?: No Does the patient have difficulty dressing or bathing?: Yes Independently performs ADLs?: No Communication: Needs assistance Does the patient have difficulty walking or climbing stairs?: Yes Weakness of Legs: Both Weakness of Arms/Hands: Both  Permission Sought/Granted Permission sought to share information with : Facility Medical sales representative, Family Supports Permission granted to share information with : Yes, Verbal Permission Granted  Share Information with NAME: daughter, Angelita  Permission granted to share info w AGENCY: SNF/[facility]        Emotional Assessment Appearance:: Appears stated age Attitude/Demeanor/Rapport: Engaged Affect (typically observed): Appropriate, Pleasant Orientation: : Oriented to Self, Oriented to Place Alcohol / Substance Use: Not Applicable Psych Involvement: No (comment)  Admission diagnosis:  Weakness [R53.1] Acute cystitis with hematuria [N30.01] AKI (acute kidney injury) (HCC) [N17.9] Hypotension [I95.9] Hypotension, unspecified hypotension type [I95.9] Patient Active Problem List   Diagnosis Date Noted  . AKI (acute kidney injury) (HCC) 02/26/2020  . Hypotension 02/26/2020  . Weakness   . Midline cystocele 05/03/2019  . OAB (overactive bladder) 05/03/2019  .  Urinary retention 05/03/2019  . Uterine procidentia 05/03/2019  . Chest pain, negative stress test. non cardiac. 06/16/2018  . Primary osteoarthritis of left hip 01/20/2017  . Primary osteoarthritis of right hip 11/10/2015  . Anxiety state 11/10/2015  . Preop  cardiovascular exam 07/23/2014  . Bilateral edema of lower extremity 07/18/2013    Class: Chronic  . Metabolic syndrome 07/18/2013  . Rectocele 07/07/2012  . Essential hypertension 07/07/2012  . GERD (gastroesophageal reflux disease) 07/07/2012  . Hyperlipidemia with target LDL less than 100 07/07/2012  . Rotator cuff tear, left 03/02/2012  . Osteoarthritis of left shoulder 01/21/2012   PCP:  Patient, No Pcp Per Pharmacy:   [id number] Judithann Sheen, [place] - [address] [place] [address] Phone: [phone] Fax: [phone]     Social Determinants of Health (SDOH) Interventions    Readmission Risk Interventions No flowsheet data found.

## 2020-02-28 NOTE — NC FL2 (Signed)
[place] MEDICAID FL2 LEVEL OF CARE SCREENING TOOL     IDENTIFICATION  Patient Name: [name] Birthdate: 19-Aug-1940 Sex: female Admission Date (Current Location): 02/26/2020  [facility] and [place] Number:  Producer, television/film/video and Address:  [facility], [address] [place], [place] [address]      Provider Number: [id number]  Attending Physician Name and Address:  Starleen Arms, MD  Relative Name and Phone Number:  [place], Luther Daughter [phone]    Current Level of Care: Hospital Recommended Level of Care: Skilled Nursing Facility Prior Approval Number:    Date Approved/Denied:   PASRR Number: [id number]  Discharge Plan: SNF    Current Diagnoses: Patient Active Problem List   Diagnosis Date Noted  . AKI (acute kidney injury) (HCC) 02/26/2020  . Hypotension 02/26/2020  . Weakness   . Midline cystocele 05/03/2019  . OAB (overactive bladder) 05/03/2019  . Urinary retention 05/03/2019  . Uterine procidentia 05/03/2019  . Chest pain, negative stress test. non cardiac. 06/16/2018  . Primary osteoarthritis of left hip 01/20/2017  . Primary osteoarthritis of right hip 11/10/2015  . Anxiety state 11/10/2015  . Preop cardiovascular exam 07/23/2014  . Bilateral edema of lower extremity 07/18/2013  . Metabolic syndrome 07/18/2013  . Rectocele 07/07/2012  . Essential hypertension 07/07/2012  . GERD (gastroesophageal reflux disease) 07/07/2012  . Hyperlipidemia with target LDL less than 100 07/07/2012  . Rotator cuff tear, left 03/02/2012  . Osteoarthritis of left shoulder 01/21/2012    Orientation RESPIRATION BLADDER Height & Weight     Self, Place  Normal Incontinent Weight:   Height:     BEHAVIORAL SYMPTOMS/MOOD NEUROLOGICAL BOWEL NUTRITION STATUS      Continent Diet (Heart diet. See discharge summary)  AMBULATORY STATUS COMMUNICATION OF NEEDS Skin   Total Care Verbally Normal                       Personal Care  Assistance Level of Assistance  Bathing, Feeding, Dressing Bathing Assistance: Maximum assistance Feeding assistance: Limited assistance Dressing Assistance: Maximum assistance     Functional Limitations Info  Sight, Hearing, Speech Sight Info: Adequate Hearing Info: Adequate Speech Info: Adequate    SPECIAL CARE FACTORS FREQUENCY  PT (By licensed PT)     PT Frequency: 5x week              Contractures Contractures Info: Not present    Additional Factors Info  Code Status, Allergies Code Status Info: full Allergies Info: Effexor Venlafaxine Hcl, Sulfa Drugs Cross Reactors           Current Medications (02/28/2020):  This is the current hospital active medication list Current Facility-Administered Medications  Medication Dose Route Frequency Provider Last Rate Last Admin  . acetaminophen (TYLENOL) tablet 500 mg  500 mg Oral Q6H PRN Mikey College T, MD   500 mg at 02/28/20 0933  . aspirin chewable tablet 81 mg  81 mg Oral Daily Mikey College T, MD   81 mg at 02/28/20 0933  . cefTRIAXone (ROCEPHIN) 1 g in sodium chloride 0.9 % 100 mL IVPB  1 g Intravenous Q24H Mikey College T, MD 200 mL/hr at 02/27/20 2125 1 g at 02/27/20 2125  . donepezil (ARICEPT) tablet 10 mg  10 mg Oral Daily Mikey College T, MD   10 mg at 02/28/20 0933  . heparin injection 5,000 Units  5,000 Units Subcutaneous Q12H Emeline General, MD   5,000 Units at 02/28/20 0900  .  hydrOXYzine (ATARAX/VISTARIL) tablet 25 mg  25 mg Oral TID PRN Mikey College T, MD      . lidocaine (LIDODERM) 5 % 1 patch  1 patch Transdermal Q24H Lanier Clam, NP   1 patch at 02/28/20 [phone]  . midodrine (PROAMATINE) tablet 5 mg  5 mg Oral TID WC Elgergawy, Leana Roe, MD   5 mg at 02/28/20 0700  . multivitamin with minerals tablet   Oral Daily Emeline General, MD   1 tablet at 02/28/20 0933  . polyethylene glycol (MIRALAX / GLYCOLAX) packet 17 g  1 packet Oral Daily PRN Mikey College T, MD      . senna-docusate (Senokot-S) tablet 1 tablet  1 tablet  Oral Daily Emeline General, MD   1 tablet at 02/28/20 2831     Discharge Medications: Please see discharge summary for a list of discharge medications.  Relevant Imaging Results:  Relevant Lab Results:   Additional Information SSN: [id number]  Lorri Frederick, LCSW

## 2020-02-29 ENCOUNTER — Encounter (HOSPITAL_COMMUNITY): Payer: Self-pay | Admitting: Internal Medicine

## 2020-02-29 DIAGNOSIS — R531 Weakness: Secondary | ICD-10-CM | POA: Diagnosis not present

## 2020-02-29 DIAGNOSIS — I959 Hypotension, unspecified: Secondary | ICD-10-CM | POA: Diagnosis not present

## 2020-02-29 DIAGNOSIS — N3001 Acute cystitis with hematuria: Secondary | ICD-10-CM | POA: Diagnosis not present

## 2020-02-29 LAB — BASIC METABOLIC PANEL
Anion gap: 8 (ref 5–15)
BUN: 17 mg/dL (ref 8–23)
CO2: 22 mmol/L (ref 22–32)
Calcium: 8.8 mg/dL — ABNORMAL LOW (ref 8.9–10.3)
Chloride: 112 mmol/L — ABNORMAL HIGH (ref 98–111)
Creatinine, Ser: 1.49 mg/dL — ABNORMAL HIGH (ref 0.44–1.00)
GFR, Estimated: 36 mL/min — ABNORMAL LOW (ref >60)
Glucose, Bld: 116 mg/dL — ABNORMAL HIGH (ref 70–99)
Potassium: 3.6 mmol/L (ref 3.5–5.1)
Sodium: 142 mmol/L (ref 135–145)

## 2020-02-29 LAB — URINE CULTURE: Culture: >=100000 — AB

## 2020-02-29 LAB — CBC
HCT: 28.7 % — ABNORMAL LOW (ref 36.0–46.0)
Hemoglobin: 9 g/dL — ABNORMAL LOW (ref 12.0–15.0)
MCH: 29.1 pg (ref 26.0–34.0)
MCHC: 31.4 g/dL (ref 30.0–36.0)
MCV: 92.9 fL (ref 80.0–100.0)
Platelets: 217 10*3/uL (ref 150–400)
RBC: 3.09 MIL/uL — ABNORMAL LOW (ref 3.87–5.11)
RDW: 16.6 % — ABNORMAL HIGH (ref 11.5–15.5)
WBC: 6.1 10*3/uL (ref 4.0–10.5)
nRBC: 0 % (ref 0.0–0.2)

## 2020-02-29 LAB — CK: Total CK: 1647 U/L — ABNORMAL HIGH (ref 38–234)

## 2020-02-29 NOTE — Progress Notes (Signed)
PROGRESS NOTE    [name]  XFG:[id number] DOB: Aug 07, 1940 DOA: 02/26/2020 PCP: Patient, No Pcp Per   Brief Narrative:  This 79 years old female with medical history significant for hypertension, dementia, hyperlipidemia presents in the emergency department with generalized weakness and frequent falls.  History is obtained from daughter, she reports symptoms for last few months and progressively getting worse.  Patient has poor appetite, chronically low blood pressure for few months.  Daughter reports similar episode 6 weeks ago,  found to have a UTI and acute kidney injury.  she was discharged on Keflex after kidney function slightly improved. Patient is admitted for hypotension, acute kidney injury, generalized weakness secondary to recurrent UTI and started on  IV. antibiotics.  Subjective:  Reports her weakness has been improving, reports she is feeling better today, reports her appetite has been improving as well, denies any dizziness or lightheadedness .  Assessment & Plan:   Active Problems:   AKI (acute kidney injury) (HCC)   Hypotension   Hypotension: -Daughter reports she remains with chronically low blood pressure. -Lactic acid , white count within normal limits, appears to be euvolemic.    -Doubt patient septic although she might have a recurrent UTI. -She was appropriately hydrated and treated with IV fluids. -PCCM consulted blood pressure is slightly improved.  PCCM recommended continue monitoring in progressive. -Echocardiogram unremarkable EF 55 consistent grade 1 diastolic dysfunction. - TSH and cortisol level normal -Blood pressure has improved after starting midodrine.  Further orthostasis.  CKD stage III/rhabdomyolysis -Recent creatinine around 2, but it does appear her baseline around one, was elevated to on admission, there is evidence of some rhabdomyolysis with elevated total CK, this has been improving with IV fluids, renal function is improving as  well.  Generalized weakness -Treat hypotension and UTI,  - PT/OT evaluation  Recurrent UTI -Most recent urine culture showed pansensitive E. coli, will continue Rocephin for now.  Urine culture growing gram-negative rods.  Follow-up blood and urine cultures  Peripheral edema -Venous duplex completed report pending.  Elevated CK: This could be due to fall -Total CK trending down, continue with current dose of IV fluids, continue to trend closely to ensure it is trending down.   DVT prophylaxis: Heparin subcu Code Status: Full code Family Communication: None at bedside Disposition Plan:  Status is: Inpatient  Remains inpatient appropriate because:Hemodynamically unstable and Inpatient level of care appropriate due to severity of illness   Dispo: The patient is from: Home              Anticipated d/c is to: SNF              Anticipated d/c date is: 2 days              Patient currently is not medically stable to d/c.   Consultants:    PCCM  Procedures:  Antimicrobials:  Anti-infectives (From admission, onward)   Start     Dose/Rate Route Frequency Ordered Stop   02/27/20 2000  cefTRIAXone (ROCEPHIN) 1 g in sodium chloride 0.9 % 100 mL IVPB        1 g 200 mL/hr over 30 Minutes Intravenous Every 24 hours 02/26/20 1912 03/03/20 1959   02/26/20 1900  cefTRIAXone (ROCEPHIN) 1 g in sodium chloride 0.9 % 100 mL IVPB        1 g 200 mL/hr over 30 Minutes Intravenous  Once 02/26/20 1856 02/26/20 2107       Objective: Vitals:   02/28/20  2225 02/29/20 0624 02/29/20 0716 02/29/20 0730  BP:   103/62   Pulse:   (!) 49 64  Resp:   (!) 9 16  Temp: 98.1 F (36.7 C) 98.1 F (36.7 C) 98.1 F (36.7 C)   TempSrc: Oral Oral Oral   SpO2:   100%     Intake/Output Summary (Last 24 hours) at 02/29/2020 1159 Last data filed at 02/29/2020 2505 Gross per 24 hour  Intake 772.94 ml  Output 800 ml  Net -27.06 ml   There were no vitals filed for this  visit.  Examination:  Awake Alert, Oriented X 3, frail, no new F.N deficits, Normal affect Symmetrical Chest wall movement, Good air movement bilaterally, CTAB RRR,No Gallops,Rubs or new Murmurs, No Parasternal Heave +ve B.Sounds, Abd Soft, No tenderness, No rebound - guarding or rigidity. No Cyanosis, Clubbing or edema, No new Rash or bruise     Data Reviewed: I have personally reviewed following labs and imaging studies  CBC: Recent Labs  Lab 02/26/20 1520 02/28/20 0159 02/29/20 0103  WBC 10.1 7.0 6.1  NEUTROABS 8.2*  --   --   HGB 10.4* 9.4* 9.0*  HCT 33.9* 30.8* 28.7*  MCV 93.4 95.7 92.9  PLT 249 205 217   Basic Metabolic Panel: Recent Labs  Lab 02/26/20 1520 02/27/20 0316 02/28/20 0159 02/29/20 0103  NA 138 143 141 142  K 4.1 3.5 3.5 3.6  CL 101 113* 114* 112*  CO2 24 21* 18* 22  GLUCOSE 120* 94 88 116*  BUN 34* 27* 20 17  CREATININE 2.04* 1.76* 1.48* 1.49*  CALCIUM 10.2 8.6* 8.9 8.8*  MG  --   --  2.1  --   PHOS  --   --  2.1*  --    GFR: CrCl cannot be calculated (Unknown ideal weight.). Liver Function Tests: Recent Labs  Lab 02/26/20 1520  AST 76*  ALT 42  ALKPHOS 63  BILITOT 0.8  PROT 7.1  ALBUMIN 3.7   No results for input(s): LIPASE, AMYLASE in the last 168 hours. No results for input(s): AMMONIA in the last 168 hours. Coagulation Profile: No results for input(s): INR, PROTIME in the last 168 hours. Cardiac Enzymes: Recent Labs  Lab 02/27/20 0316 02/28/20 0159 02/29/20 0103  CKTOTAL 1,775* 2,192* 1,647*   BNP (last 3 results) No results for input(s): PROBNP in the last 8760 hours. HbA1C: No results for input(s): HGBA1C in the last 72 hours. CBG: Recent Labs  Lab 02/27/20 0808 02/27/20 0936 02/27/20 2225  GLUCAP 59* 74 103*   Lipid Profile: No results for input(s): CHOL, HDL, LDLCALC, TRIG, CHOLHDL, LDLDIRECT in the last 72 hours. Thyroid Function Tests: Recent Labs    02/26/20 1909  TSH 0.644   Anemia Panel: No  results for input(s): VITAMINB12, FOLATE, FERRITIN, TIBC, IRON, RETICCTPCT in the last 72 hours. Sepsis Labs: Recent Labs  Lab 02/26/20 1520 02/27/20 0316  LATICACIDVEN 1.1 0.8    Recent Results (from the past 240 hour(s))  Respiratory Panel by RT PCR (Flu A&B, Covid) - Nasopharyngeal Swab     Status: None   Collection Time: 02/26/20  2:32 PM   Specimen: Nasopharyngeal Swab  Result Value Ref Range Status   SARS Coronavirus 2 by RT PCR NEGATIVE NEGATIVE Final    Comment: (NOTE) SARS-CoV-2 target nucleic acids are NOT DETECTED.  The SARS-CoV-2 RNA is generally detectable in upper respiratoy specimens during the acute phase of infection. The lowest concentration of SARS-CoV-2 viral copies this assay can detect is  131 copies/mL. A negative result does not preclude SARS-Cov-2 infection and should not be used as the sole basis for treatment or other patient management decisions. A negative result may occur with  improper specimen collection/handling, submission of specimen other than nasopharyngeal swab, presence of viral mutation(s) within the areas targeted by this assay, and inadequate number of viral copies (<131 copies/mL). A negative result must be combined with clinical observations, patient history, and epidemiological information. The expected result is Negative.  Fact Sheet for Patients:  https://www.moore.com/  Fact Sheet for Healthcare Providers:  https://www.young.biz/  This test is no t yet approved or cleared by the [place] FDA and  has been authorized for detection and/or diagnosis of SARS-CoV-2 by FDA under an Emergency Use Authorization (EUA). This EUA will remain  in effect (meaning this test can be used) for the duration of the COVID-19 declaration under Section 564(b)(1) of the Act, 21 U.S.C. section 360bbb-3(b)(1), unless the authorization is terminated or revoked sooner.     Influenza A by PCR NEGATIVE NEGATIVE  Final   Influenza B by PCR NEGATIVE NEGATIVE Final    Comment: (NOTE) The Xpert Xpress SARS-CoV-2/FLU/RSV assay is intended as an aid in  the diagnosis of influenza from Nasopharyngeal swab specimens and  should not be used as a sole basis for treatment. Nasal washings and  aspirates are unacceptable for Xpert Xpress SARS-CoV-2/FLU/RSV  testing.  Fact Sheet for Patients: https://www.moore.com/  Fact Sheet for Healthcare Providers: https://www.young.biz/  This test is not yet approved or cleared by the [place] FDA and  has been authorized for detection and/or diagnosis of SARS-CoV-2 by  FDA under an Emergency Use Authorization (EUA). This EUA will remain  in effect (meaning this test can be used) for the duration of the  Covid-19 declaration under Section 564(b)(1) of the Act, 21  U.S.C. section 360bbb-3(b)(1), unless the authorization is  terminated or revoked. Performed at [facility], [address], [place], [place] [address]   Culture, blood (routine x 2)     Status: None (Preliminary result)   Collection Time: 02/26/20  3:30 PM   Specimen: BLOOD  Result Value Ref Range Status   Specimen Description BLOOD SITE NOT SPECIFIED  Final   Special Requests   Final    BOTTLES DRAWN AEROBIC AND ANAEROBIC Blood Culture adequate volume   Culture   Final    NO GROWTH 2 DAYS Performed at [facility], [address], [place], [place] [address]    Report Status PENDING  Incomplete  Culture, blood (routine x 2)     Status: None (Preliminary result)   Collection Time: 02/26/20  5:20 PM   Specimen: BLOOD RIGHT ARM  Result Value Ref Range Status   Specimen Description BLOOD RIGHT ARM  Final   Special Requests   Final    BOTTLES DRAWN AEROBIC AND ANAEROBIC Blood Culture adequate volume   Culture   Final    NO GROWTH 2 DAYS Performed at [facility], [address], [place], [place] [address]    Report Status PENDING   Incomplete  Urine culture     Status: Abnormal   Collection Time: 02/26/20  6:15 PM   Specimen: Urine, Random  Result Value Ref Range Status   Specimen Description URINE, RANDOM  Final   Special Requests   Final    NONE Performed at [facility], [address], [place], [place] [address]    Culture >=100,000 COLONIES/mL ESCHERICHIA COLI (  A)  Final   Report Status 02/29/2020 FINAL  Final   Organism ID, Bacteria ESCHERICHIA COLI (A)  Final      Susceptibility   Escherichia coli - MIC*    AMPICILLIN <=2 SENSITIVE Sensitive     CEFAZOLIN <=4 SENSITIVE Sensitive     CEFEPIME <=0.12 SENSITIVE Sensitive     CEFTRIAXONE <=0.25 SENSITIVE Sensitive     CIPROFLOXACIN <=0.25 SENSITIVE Sensitive     GENTAMICIN >=16 RESISTANT Resistant     IMIPENEM <=0.25 SENSITIVE Sensitive     NITROFURANTOIN <=16 SENSITIVE Sensitive     TRIMETH/SULFA >=320 RESISTANT Resistant     AMPICILLIN/SULBACTAM <=2 SENSITIVE Sensitive     PIP/TAZO <=4 SENSITIVE Sensitive     * >=100,000 COLONIES/mL ESCHERICHIA COLI         Radiology Studies: No results found. Scheduled Meds: . aspirin  81 mg Oral Daily  . donepezil  10 mg Oral Daily  . heparin  5,000 Units Subcutaneous Q12H  . lidocaine  1 patch Transdermal Q24H  . midodrine  5 mg Oral TID WC  . multivitamin with minerals   Oral Daily  . senna-docusate  1 tablet Oral Daily   Continuous Infusions: . 0.9 % NaCl with KCl 20 mEq / L 50 mL/hr at 02/28/20 1602  . cefTRIAXone (ROCEPHIN)  IV 1 g (02/28/20 2035)     LOS: 3 days      Huey Bienenstockawood Linnea Todisco, MD [facility]   If 7PM-7AM, please contact night-coverage

## 2020-02-29 NOTE — Plan of Care (Signed)
Patient remains free falls. Up in bedside chair with x 2 assist. Denies pain or discomfort. Safety precautions maintained.

## 2020-02-29 NOTE — Evaluation (Addendum)
Occupational Therapy Evaluation Patient Details Name: [name] MRN: [id number] DOB: 1940-05-07 Today's Date: 02/29/2020    History of Present Illness Pt presents to hospital with weakness and frequent falls. Pt found to have hypotension, AKI, and recurrent UTI. PMH - htn, dementia, THR, arthritis.   Clinical Impression   PTA patient reports needing some assist from daughter for ADLs, mobility using cane but questionable historian due to dementia.  Admitted for above and limited by problem list below, including impaired balance, generalized weakness, decreased functional use of L shoulder, decreased activity tolerance.  Patient currently requires max assist for bed mobility, max assist for sit to stand and min assist +2 safety to pivot to recliner using RW, and up to max-total assist for ADLs.  She follows simple commands, demonstrates decreased awareness to deficits, poor problem solving; voices fear of falling and anxious with mobility.  She will benefit from continued OT services while admitted and after dc at SNF level to optimize independence with ADls and decrease burden of care. Will follow acutely.    BP supine 94/79 HR 67        EOB  104/82 HR 75       Standing 108/81 HR 84   Follow Up Recommendations  Supervision/Assistance - 24 hour;SNF    Equipment Recommendations  3 in 1 bedside commode;Other (comment) (RW)    Recommendations for Other Services       Precautions / Restrictions Precautions Precautions: Fall;Other (comment) Precaution Comments: watch BP Restrictions Weight Bearing Restrictions: No      Mobility Bed Mobility Overal bed mobility: Needs Assistance Bed Mobility: Supine to Sit     Supine to sit: Max assist;HOB elevated     General bed mobility comments: for LB mgmt to EOB, scooting and trunk support     Transfers Overall transfer level: Needs assistance Equipment used: Rolling walker (2 wheeled) Transfers: Sit to/from [employer] Sit to Stand: Max assist;+2 safety/equipment Stand pivot transfers: Min assist;+2 safety/equipment       General transfer comment: cueing for hand placement, max assist to power up fully and shift hips anteriorly; pivoting to recliner with increased time and cueing     Balance Overall balance assessment: Needs assistance Sitting-balance support: No upper extremity supported;Feet supported Sitting balance-Leahy Scale: Fair Sitting balance - Comments: statically with min guard to close supervision    Standing balance support: Bilateral upper extremity supported;During functional activity Standing balance-Leahy Scale: Poor Standing balance comment: relies on BUE and external support, posterior shift noted                           ADL either performed or assessed with clinical judgement   ADL Overall ADL's : Needs assistance/impaired     Grooming: Minimal assistance;Sitting   Upper Body Bathing: Sitting;Minimal assistance   Lower Body Bathing: Moderate assistance;+2 for safety/equipment;Sit to/from stand   Upper Body Dressing : Minimal assistance;Sitting   Lower Body Dressing: Maximal assistance;Sit to/from stand;+2 for safety/equipment   Toilet Transfer: Maximal assistance;+2 for safety/equipment;Stand-pivot;RW Toilet Transfer Details (indicate cue type and reason): simulated to recliner          Functional mobility during ADLs: Maximal assistance;+2 for safety/equipment;Rolling walker;Cueing for sequencing;Cueing for safety General ADL Comments: pt limited by weakness, cognition, impaired balance, generalized weakness      Vision         Perception     Praxis      Pertinent Vitals/Pain Pain Assessment:  Faces Faces Pain Scale: Hurts a little bit Pain Location: feet Pain Descriptors / Indicators: Grimacing Pain Intervention(s): Limited activity within patient's tolerance;Monitored during session;Repositioned     Hand Dominance Right    Extremity/Trunk Assessment Upper Extremity Assessment Upper Extremity Assessment: Generalized weakness;LUE deficits/detail LUE Deficits / Details: reports hx of difficulty using L shoulder- requires support to flex from R UE  LUE Coordination: decreased gross motor   Lower Extremity Assessment Lower Extremity Assessment: Defer to PT evaluation       Communication Communication Communication: No difficulties   Cognition Arousal/Alertness: Awake/alert Behavior During Therapy: Anxious Overall Cognitive Status: No family/caregiver present to determine baseline cognitive functioning                                 General Comments: pt with hx of dementia, follows commands with increased time; oriented to place and self. Anxious with mobility and fear of falling.    General Comments  VSS during session, orthostatics assessed     Exercises     Shoulder Instructions      Home Living Family/patient expects to be discharged to:: Private residence Living Arrangements: Children Available Help at Discharge: Family;Available PRN/intermittently Type of Home: House       Home Layout: One level               Home Equipment: Cane - single point;Walker - 4 wheels   Additional Comments: pt poor historian       Prior Functioning/Environment Level of Independence: Needs assistance  Gait / Transfers Assistance Needed: Pt reports she walks with cane at home ADL's / Homemaking Assistance Needed: Pt reports assist from daughter   Comments: Questionable accuracy due to dementia. Reports she had to call daughter a lot to help her up        OT Problem List: Decreased strength;Decreased range of motion;Decreased activity tolerance;Impaired balance (sitting and/or standing);Decreased coordination;Decreased cognition;Decreased safety awareness;Decreased knowledge of use of DME or AE;Decreased knowledge of precautions;Pain;Impaired UE functional use      OT  Treatment/Interventions: Self-care/ADL training;DME and/or AE instruction;Therapeutic exercise;Therapeutic activities;Patient/family education;Balance training    OT Goals(Current goals can be found in the care plan section) Acute Rehab OT Goals Patient Stated Goal: not stated Time For Goal Achievement: 03/14/20 Potential to Achieve Goals: Good  OT Frequency: Min 2X/week   Barriers to D/C:            Co-evaluation              AM-PAC OT "6 Clicks" Daily Activity     Outcome Measure Help from another person eating meals?: A Little Help from another person taking care of personal grooming?: A Little Help from another person toileting, which includes using toliet, bedpan, or urinal?: A Lot Help from another person bathing (including washing, rinsing, drying)?: A Lot Help from another person to put on and taking off regular upper body clothing?: A Little Help from another person to put on and taking off regular lower body clothing?: A Lot 6 Click Score: 15   End of Session Equipment Utilized During Treatment: Gait belt;Rolling walker Nurse Communication: Mobility status;Precautions  Activity Tolerance: Patient tolerated treatment well Patient left: in chair;with call bell/phone within reach;with chair alarm set;with nursing/sitter in room  OT Visit Diagnosis: Other abnormalities of gait and mobility (R26.89);Muscle weakness (generalized) (M62.81);Other symptoms and signs involving cognitive function;History of falling (Z91.81)  Time: 1201-1224 OT Time Calculation (min): 23 min Charges:  OT General Charges $OT Visit: 1 Visit OT Evaluation $OT Eval Moderate Complexity: 1 Mod OT Treatments $Self Care/Home Management : 8-22 mins  Barry Brunner, OT Acute Rehabilitation Services Pager [phone] Office [phone]   Chancy Milroy 02/29/2020, 12:52 PM

## 2020-02-29 NOTE — TOC Progression Note (Signed)
Transition of Care [facility]) - Progression Note    Patient Details  Name: SEVIN FARONE MRN: [id number] Date of Birth: 1940-08-01  [facility] CM/SW Contact  Lorri Frederick, LCSW Phone Number: 02/29/2020, 1:18 PM  Clinical Narrative:   CSW spoke with pt daughter Angelita about bed offers and she wants to choose [facility].  Confirmed with Everardo Pacific at [facility] that family has accepted bed offer.     Expected Discharge Plan: Skilled Nursing Facility Barriers to Discharge: Continued Medical Work up, SNF Pending bed offer  Expected Discharge Plan and Services Expected Discharge Plan: Skilled Nursing Facility     Post Acute Care Choice: Skilled Nursing Facility Living arrangements for the past 2 months: Single Family Home                                       Social Determinants of Health (SDOH) Interventions    Readmission Risk Interventions No flowsheet data found.

## 2020-02-29 NOTE — Plan of Care (Signed)

## 2020-03-01 DIAGNOSIS — I959 Hypotension, unspecified: Secondary | ICD-10-CM | POA: Diagnosis not present

## 2020-03-01 DIAGNOSIS — N3001 Acute cystitis with hematuria: Secondary | ICD-10-CM | POA: Diagnosis not present

## 2020-03-01 DIAGNOSIS — N179 Acute kidney failure, unspecified: Secondary | ICD-10-CM | POA: Diagnosis not present

## 2020-03-01 LAB — CBC
HCT: 29 % — ABNORMAL LOW (ref 36.0–46.0)
Hemoglobin: 9.1 g/dL — ABNORMAL LOW (ref 12.0–15.0)
MCH: 28.9 pg (ref 26.0–34.0)
MCHC: 31.4 g/dL (ref 30.0–36.0)
MCV: 92.1 fL (ref 80.0–100.0)
Platelets: 243 10*3/uL (ref 150–400)
RBC: 3.15 MIL/uL — ABNORMAL LOW (ref 3.87–5.11)
RDW: 16.6 % — ABNORMAL HIGH (ref 11.5–15.5)
WBC: 6 10*3/uL (ref 4.0–10.5)
nRBC: 0 % (ref 0.0–0.2)

## 2020-03-01 LAB — BASIC METABOLIC PANEL
Anion gap: 5 (ref 5–15)
BUN: 15 mg/dL (ref 8–23)
CO2: 24 mmol/L (ref 22–32)
Calcium: 8.8 mg/dL — ABNORMAL LOW (ref 8.9–10.3)
Chloride: 113 mmol/L — ABNORMAL HIGH (ref 98–111)
Creatinine, Ser: 1.57 mg/dL — ABNORMAL HIGH (ref 0.44–1.00)
GFR, Estimated: 34 mL/min — ABNORMAL LOW (ref >60)
Glucose, Bld: 119 mg/dL — ABNORMAL HIGH (ref 70–99)
Potassium: 3.9 mmol/L (ref 3.5–5.1)
Sodium: 142 mmol/L (ref 135–145)

## 2020-03-01 LAB — CK: Total CK: 1133 U/L — ABNORMAL HIGH (ref 38–234)

## 2020-03-01 MED ORDER — MIDODRINE HCL 5 MG PO TABS
10.0000 mg | ORAL_TABLET | Freq: Two times a day (BID) | ORAL | Status: DC
  Administered 2020-03-01 – 2020-03-03 (×5): 10 mg via ORAL
  Filled 2020-03-01 (×5): qty 2

## 2020-03-01 MED ORDER — MIDODRINE HCL 5 MG PO TABS: 10.0000 mg | ORAL_TABLET | Freq: Three times a day (TID) | ORAL | Status: DC

## 2020-03-01 NOTE — Progress Notes (Signed)
Physical Therapy Treatment Patient Details Name: [name] MRN: [id number] DOB: 16-May-1940 Today's Date: 03/01/2020    History of Present Illness Pt presents to hospital with weakness and frequent falls. Pt found to have hypotension, AKI, and recurrent UTI. PMH - htn, dementia, THR, arthritis.    PT Comments    Pt presents to PT with increased independence with bed mobility, transfers, and the ability to perform gait this session. Pt used Stedy again this session but was determined to not need it secondary to min assist x2 with it and overall strength. Pt was able to ambulate but with some multimodal cues needed for turning. Pt remains appropriate for acute PT secondary to assistance level and cognitive remediation needed for bed mob, transfers, and ambulation. Pts d/c plan remains appropriate secondary to lack of support at home at d/c, fall risk, and cognitive status.    Follow Up Recommendations  SNF;Supervision/Assistance - 24 hour     Equipment Recommendations  None recommended by PT    Recommendations for Other Services       Precautions / Restrictions Precautions Precautions: Fall Restrictions Weight Bearing Restrictions: No    Mobility  Bed Mobility Overal bed mobility: Needs Assistance Bed Mobility: Supine to Sit     Supine to sit: Mod assist;HOB elevated     General bed mobility comments: for LE management and trunk support; pt had L lateral trunk lean in static sitting  Transfers Overall transfer level: Needs assistance Equipment used: Rolling walker (2 wheeled) Transfers: Sit to/from Stand Sit to Stand: Mod assist         General transfer comment: pt initially performed sit to stand with Stedy and then performed x10 sit to stands in [place]; pt was min assist x2 with Antony Salmon, so sit to stand with RW was attempted; Pt able to perform x2 sit to stands with RW, the first one being mod assist +2 and second one being min assist  +1  Ambulation/Gait Ambulation/Gait assistance: Min assist;+2 safety/equipment (+2 for chair follow) Gait Distance (Feet): 120 Feet Assistive device: Rolling walker (2 wheeled) Gait Pattern/deviations: Step-to pattern;Decreased step length - right;Decreased step length - left;Decreased stride length;Wide base of support   Gait velocity interpretation: <1.8 ft/sec, indicate of risk for recurrent falls General Gait Details: pt needed multimodal cueing for turning around during gait   Stairs             Wheelchair Mobility    Modified Rankin (Stroke Patients Only)       Balance Overall balance assessment: Needs assistance Sitting-balance support: Feet supported;No upper extremity supported Sitting balance-Leahy Scale: Fair Sitting balance - Comments: statically with min guard to close supervision; pt preferred L lateral trunk lean with L UE support   Standing balance support: Bilateral upper extremity supported;During functional activity Standing balance-Leahy Scale: Poor Standing balance comment: relies on BUE RW or stedy support                            Cognition Arousal/Alertness: Awake/alert Behavior During Therapy: WFL for tasks assessed/performed Overall Cognitive Status: No family/caregiver present to determine baseline cognitive functioning                                 General Comments: pt has a hx of dementia but able to follow commands with increased time and multimodal cueing; A&X x4; fear of falling  Exercises      General Comments        Pertinent Vitals/Pain Pain Assessment: Faces Faces Pain Scale: No hurt    Home Living                      Prior Function            PT Goals (current goals can now be found in the care plan section) Progress towards PT goals: Progressing toward goals    Frequency    Min 3X/week      PT Plan Current plan remains appropriate    Co-evaluation               AM-PAC PT "6 Clicks" Mobility   Outcome Measure  Help needed turning from your back to your side while in a flat bed without using bedrails?: A Lot Help needed moving from lying on your back to sitting on the side of a flat bed without using bedrails?: A Lot Help needed moving to and from a bed to a chair (including a wheelchair)?: A Little Help needed standing up from a chair using your arms (e.g., wheelchair or bedside chair)?: A Little Help needed to walk in hospital room?: A Little Help needed climbing 3-5 steps with a railing? : Total 6 Click Score: 14    End of Session Equipment Utilized During Treatment: Gait belt Activity Tolerance: Patient tolerated treatment well Patient left: in chair;with call bell/phone within reach;with chair alarm set   PT Visit Diagnosis: Other abnormalities of gait and mobility (R26.89);History of falling (Z91.81);Muscle weakness (generalized) (M62.81)     Time: 0160-1093 PT Time Calculation (min) (ACUTE ONLY): 33 min  Charges:  $Gait Training: 8-22 mins $Therapeutic Activity: 8-22 mins                     [place], SPT [id number]   Niambi Smoak 03/01/2020, 11:41 AM

## 2020-03-01 NOTE — Progress Notes (Signed)
PROGRESS NOTE    LEDONNA DORMER  FWY:[id number] DOB: Jan 11, 1941 DOA: 02/26/2020 PCP: Patient, No Pcp Per   Brief Narrative:  This 79 years old female with medical history significant for hypertension, dementia, hyperlipidemia presents in the emergency department with generalized weakness and frequent falls.  History is obtained from daughter, she reports symptoms for last few months and progressively getting worse.  Patient has poor appetite, chronically low blood pressure for few months.  Daughter reports similar episode 6 weeks ago,  found to have a UTI and acute kidney injury.  she was discharged on Keflex after kidney function slightly improved. Patient is admitted for hypotension, acute kidney injury, generalized weakness secondary to recurrent UTI and started on  IV. antibiotics.  Subjective:  Reports she is feeling better today,, reports dizziness and light headedness has significantly improved.  Assessment & Plan:   Active Problems:   AKI (acute kidney injury) (HCC)   Hypotension   Hypotension: -Lactic acid , white count within normal limits, appears to be euvolemic.    No evidence of sepsis on admission -She was appropriately hydrated and treated with IV fluids. -PCCM consulted blood pressure is slightly improved.  PCCM recommended continue monitoring in progressive. -Echocardiogram unremarkable EF 55 consistent grade 1 diastolic dysfunction. - TSH and cortisol level normal -Blood pressure has improved after starting midodrine.  But she remains orthostatic, so I have increased dosing. -Reviewing her medications, it does appear she was started on Aricept few months ago, and this may contribute to bradycardia and hypotension, so Aricept has been stopped.. -Encouraged to wear TED hose  CKD stage III/rhabdomyolysis -Recent creatinine around 2, but it does appear her baseline around one, was elevated to on admission, there is evidence of some rhabdomyolysis with elevated total  CK, this has been improving with IV fluids, renal function is improving as well.  Generalized weakness -Treat hypotension and UTI,  - PT/OT evaluation  Recurrent UTI -Most recent urine culture showed pansensitive E. coli, will continue Rocephin for now.  Urine culture growing gram-negative rods.  Follow-up blood and urine cultures  Peripheral edema -Evidence of DVT on venous Doppler, she will be put TED hose.  Elevated CK: This could be due to fall -Total CK trending down, continue with current dose of IV fluids, continue to trend closely to ensure it is trending down.   DVT prophylaxis: Heparin subcu Code Status: Full code Family Communication: None at bedside Disposition Plan:  Status is: Inpatient  Remains inpatient appropriate because:Hemodynamically unstable and Inpatient level of care appropriate due to severity of illness   Dispo: The patient is from: Home              Anticipated d/c is to: SNF              Anticipated d/c date is: 2 days              Patient currently is not medically stable to d/c.  Pressure remains on the lower side.   Consultants:    PCCM  Procedures:  Antimicrobials:  Anti-infectives (From admission, onward)   Start     Dose/Rate Route Frequency Ordered Stop   02/27/20 2000  cefTRIAXone (ROCEPHIN) 1 g in sodium chloride 0.9 % 100 mL IVPB        1 g 200 mL/hr over 30 Minutes Intravenous Every 24 hours 02/26/20 1912 03/03/20 1959   02/26/20 1900  cefTRIAXone (ROCEPHIN) 1 g in sodium chloride 0.9 % 100 mL IVPB  1 g 200 mL/hr over 30 Minutes Intravenous  Once 02/26/20 1856 02/26/20 2107       Objective: Vitals:   03/01/20 0500 03/01/20 0728 03/01/20 0729 03/01/20 0731  BP: (!) 83/55 95/64 99/74  (!) 85/63  Pulse: (!) 59 66 74 84  Resp: 16 15 19 15   Temp: 98 F (36.7 C) 98.3 F (36.8 C)    TempSrc: Oral Oral    SpO2:   100% 100%  Weight:      Height:        Intake/Output Summary (Last 24 hours) at 03/01/2020 1310 Last  data filed at 03/01/2020 0800 Gross per 24 hour  Intake 220 ml  Output 600 ml  Net -380 ml   Filed Weights   02/29/20 1631  Weight: 63.5 kg    Examination:  Awake Alert, Oriented X 3, No new F.N deficits, Normal affect Symmetrical Chest wall movement, Good air movement bilaterally, CTAB RRR,No Gallops,Rubs or new Murmurs, No Parasternal Heave +ve B.Sounds, Abd Soft, No tenderness, No rebound - guarding or rigidity. No Cyanosis, Clubbing or edema, No new Rash or bruise      Data Reviewed: I have personally reviewed following labs and imaging studies  CBC: Recent Labs  Lab 02/26/20 1520 02/28/20 0159 02/29/20 0103 03/01/20 0103  WBC 10.1 7.0 6.1 6.0  NEUTROABS 8.2*  --   --   --   HGB 10.4* 9.4* 9.0* 9.1*  HCT 33.9* 30.8* 28.7* 29.0*  MCV 93.4 95.7 92.9 92.1  PLT 249 205 217 243   Basic Metabolic Panel: Recent Labs  Lab 02/26/20 1520 02/27/20 0316 02/28/20 0159 02/29/20 0103 03/01/20 0103  NA 138 143 141 142 142  K 4.1 3.5 3.5 3.6 3.9  CL 101 113* 114* 112* 113*  CO2 24 21* 18* 22 24  GLUCOSE 120* 94 88 116* 119*  BUN 34* 27* 20 17 15   CREATININE 2.04* 1.76* 1.48* 1.49* 1.57*  CALCIUM 10.2 8.6* 8.9 8.8* 8.8*  MG  --   --  2.1  --   --   PHOS  --   --  2.1*  --   --    GFR: Estimated Creatinine Clearance: 25.6 mL/min (A) (by C-G formula based on SCr of 1.57 mg/dL (H)). Liver Function Tests: Recent Labs  Lab 02/26/20 1520  AST 76*  ALT 42  ALKPHOS 63  BILITOT 0.8  PROT 7.1  ALBUMIN 3.7   No results for input(s): LIPASE, AMYLASE in the last 168 hours. No results for input(s): AMMONIA in the last 168 hours. Coagulation Profile: No results for input(s): INR, PROTIME in the last 168 hours. Cardiac Enzymes: Recent Labs  Lab 02/27/20 0316 02/28/20 0159 02/29/20 0103 03/01/20 0103  CKTOTAL 1,775* 2,192* 1,647* 1,133*   BNP (last 3 results) No results for input(s): PROBNP in the last 8760 hours. HbA1C: No results for input(s): HGBA1C in the  last 72 hours. CBG: Recent Labs  Lab 02/27/20 0808 02/27/20 0936 02/27/20 2225  GLUCAP 59* 74 103*   Lipid Profile: No results for input(s): CHOL, HDL, LDLCALC, TRIG, CHOLHDL, LDLDIRECT in the last 72 hours. Thyroid Function Tests: No results for input(s): TSH, T4TOTAL, FREET4, T3FREE, THYROIDAB in the last 72 hours. Anemia Panel: No results for input(s): VITAMINB12, FOLATE, FERRITIN, TIBC, IRON, RETICCTPCT in the last 72 hours. Sepsis Labs: Recent Labs  Lab 02/26/20 1520 02/27/20 0316  LATICACIDVEN 1.1 0.8    Recent Results (from the past 240 hour(s))  Respiratory Panel by RT PCR (Flu A&B, Covid) -  Nasopharyngeal Swab     Status: None   Collection Time: 02/26/20  2:32 PM   Specimen: Nasopharyngeal Swab  Result Value Ref Range Status   SARS Coronavirus 2 by RT PCR NEGATIVE NEGATIVE Final    Comment: (NOTE) SARS-CoV-2 target nucleic acids are NOT DETECTED.  The SARS-CoV-2 RNA is generally detectable in upper respiratoy specimens during the acute phase of infection. The lowest concentration of SARS-CoV-2 viral copies this assay can detect is 131 copies/mL. A negative result does not preclude SARS-Cov-2 infection and should not be used as the sole basis for treatment or other patient management decisions. A negative result may occur with  improper specimen collection/handling, submission of specimen other than nasopharyngeal swab, presence of viral mutation(s) within the areas targeted by this assay, and inadequate number of viral copies (<131 copies/mL). A negative result must be combined with clinical observations, patient history, and epidemiological information. The expected result is Negative.  Fact Sheet for Patients:  https://www.moore.com/https://www.fda.gov/media/142436/download  Fact Sheet for Healthcare Providers:  https://www.young.biz/https://www.fda.gov/media/142435/download  This test is no t yet approved or cleared by the [place] FDA and  has been authorized for detection and/or diagnosis  of SARS-CoV-2 by FDA under an Emergency Use Authorization (EUA). This EUA will remain  in effect (meaning this test can be used) for the duration of the COVID-19 declaration under Section 564(b)(1) of the Act, 21 U.S.C. section 360bbb-3(b)(1), unless the authorization is terminated or revoked sooner.     Influenza A by PCR NEGATIVE NEGATIVE Final   Influenza B by PCR NEGATIVE NEGATIVE Final    Comment: (NOTE) The Xpert Xpress SARS-CoV-2/FLU/RSV assay is intended as an aid in  the diagnosis of influenza from Nasopharyngeal swab specimens and  should not be used as a sole basis for treatment. Nasal washings and  aspirates are unacceptable for Xpert Xpress SARS-CoV-2/FLU/RSV  testing.  Fact Sheet for Patients: https://www.moore.com/https://www.fda.gov/media/142436/download  Fact Sheet for Healthcare Providers: https://www.young.biz/https://www.fda.gov/media/142435/download  This test is not yet approved or cleared by the [place] FDA and  has been authorized for detection and/or diagnosis of SARS-CoV-2 by  FDA under an Emergency Use Authorization (EUA). This EUA will remain  in effect (meaning this test can be used) for the duration of the  Covid-19 declaration under Section 564(b)(1) of the Act, 21  U.S.C. section 360bbb-3(b)(1), unless the authorization is  terminated or revoked. Performed at [facility], [address] [place], [place] [address]   Culture, blood (routine x 2)     Status: None (Preliminary result)   Collection Time: 02/26/20  3:30 PM   Specimen: BLOOD  Result Value Ref Range Status   Specimen Description BLOOD SITE NOT SPECIFIED  Final   Special Requests   Final    BOTTLES DRAWN AEROBIC AND ANAEROBIC Blood Culture adequate volume   Culture   Final    NO GROWTH 3 DAYS Performed at [facility], [address] [place], [place] [address]    Report Status PENDING  Incomplete  Culture, blood (routine x 2)     Status: None (Preliminary result)   Collection Time: 02/26/20  5:20 PM    Specimen: BLOOD RIGHT ARM  Result Value Ref Range Status   Specimen Description BLOOD RIGHT ARM  Final   Special Requests   Final    BOTTLES DRAWN AEROBIC AND ANAEROBIC Blood Culture adequate volume   Culture   Final    NO GROWTH 3 DAYS Performed at [facility], [address]  [place], [place] [address]    Report Status PENDING  Incomplete  Urine culture     Status: Abnormal   Collection Time: 02/26/20  6:15 PM   Specimen: Urine, Random  Result Value Ref Range Status   Specimen Description URINE, RANDOM  Final   Special Requests   Final    NONE Performed at [facility], [address], [place], [place] [address]    Culture >=100,000 COLONIES/mL ESCHERICHIA COLI (A)  Final   Report Status 02/29/2020 FINAL  Final   Organism ID, Bacteria ESCHERICHIA COLI (A)  Final      Susceptibility   Escherichia coli - MIC*    AMPICILLIN <=2 SENSITIVE Sensitive     CEFAZOLIN <=4 SENSITIVE Sensitive     CEFEPIME <=0.12 SENSITIVE Sensitive     CEFTRIAXONE <=0.25 SENSITIVE Sensitive     CIPROFLOXACIN <=0.25 SENSITIVE Sensitive     GENTAMICIN >=16 RESISTANT Resistant     IMIPENEM <=0.25 SENSITIVE Sensitive     NITROFURANTOIN <=16 SENSITIVE Sensitive     TRIMETH/SULFA >=320 RESISTANT Resistant     AMPICILLIN/SULBACTAM <=2 SENSITIVE Sensitive     PIP/TAZO <=4 SENSITIVE Sensitive     * >=100,000 COLONIES/mL ESCHERICHIA COLI         Radiology Studies: No results found. Scheduled Meds: . aspirin  81 mg Oral Daily  . heparin  5,000 Units Subcutaneous Q12H  . lidocaine  1 patch Transdermal Q24H  . midodrine  5 mg Oral TID WC  . multivitamin with minerals   Oral Daily  . senna-docusate  1 tablet Oral Daily   Continuous Infusions: . 0.9 % NaCl with KCl 20 mEq / L 50 mL/hr at 03/01/20 0806  . cefTRIAXone (ROCEPHIN)  IV Stopped (03/01/20 0723)     LOS: 4 days      Huey Bienenstock, MD [facility]   If 7PM-7AM, please contact night-coverage

## 2020-03-01 NOTE — Plan of Care (Signed)

## 2020-03-01 NOTE — Care Management Important Message (Signed)
Important Message  Patient Details  Name: [name] MRN: [id number] Date of Birth: 10/05/1940   Medicare Important Message Given:  Yes     Hurley Sobel Stefan Church 03/01/2020, 9:58 AM

## 2020-03-02 DIAGNOSIS — N3001 Acute cystitis with hematuria: Secondary | ICD-10-CM | POA: Diagnosis not present

## 2020-03-02 DIAGNOSIS — N179 Acute kidney failure, unspecified: Secondary | ICD-10-CM | POA: Diagnosis not present

## 2020-03-02 DIAGNOSIS — I959 Hypotension, unspecified: Secondary | ICD-10-CM | POA: Diagnosis not present

## 2020-03-02 LAB — BASIC METABOLIC PANEL
Anion gap: 6 (ref 5–15)
BUN: 14 mg/dL (ref 8–23)
CO2: 24 mmol/L (ref 22–32)
Calcium: 8.8 mg/dL — ABNORMAL LOW (ref 8.9–10.3)
Chloride: 112 mmol/L — ABNORMAL HIGH (ref 98–111)
Creatinine, Ser: 1.26 mg/dL — ABNORMAL HIGH (ref 0.44–1.00)
GFR, Estimated: 44 mL/min — ABNORMAL LOW (ref >60)
Glucose, Bld: 100 mg/dL — ABNORMAL HIGH (ref 70–99)
Potassium: 4.1 mmol/L (ref 3.5–5.1)
Sodium: 142 mmol/L (ref 135–145)

## 2020-03-02 LAB — CBC
HCT: 28.9 % — ABNORMAL LOW (ref 36.0–46.0)
Hemoglobin: 8.9 g/dL — ABNORMAL LOW (ref 12.0–15.0)
MCH: 28.6 pg (ref 26.0–34.0)
MCHC: 30.8 g/dL (ref 30.0–36.0)
MCV: 92.9 fL (ref 80.0–100.0)
Platelets: 252 10*3/uL (ref 150–400)
RBC: 3.11 MIL/uL — ABNORMAL LOW (ref 3.87–5.11)
RDW: 16.5 % — ABNORMAL HIGH (ref 11.5–15.5)
WBC: 6.4 10*3/uL (ref 4.0–10.5)
nRBC: 0 % (ref 0.0–0.2)

## 2020-03-02 LAB — CK: Total CK: 640 U/L — ABNORMAL HIGH (ref 38–234)

## 2020-03-02 LAB — CULTURE, BLOOD (ROUTINE X 2)
Culture: NO GROWTH
Culture: NO GROWTH
Special Requests: ADEQUATE
Special Requests: ADEQUATE

## 2020-03-02 NOTE — Progress Notes (Signed)
PROGRESS NOTE    [name]  ZOX:[id number] DOB: December 02, 1940 DOA: 02/26/2020 PCP: Patient, No Pcp Per   Brief Narrative:  This 79 years old female with medical history significant for hypertension, dementia, hyperlipidemia presents in the emergency department with generalized weakness and frequent falls.  History is obtained from daughter, she reports symptoms for last few months and progressively getting worse.  Patient has poor appetite, chronically low blood pressure for few months.  Daughter reports similar episode 6 weeks ago,  found to have a UTI and acute kidney injury.  she was discharged on Keflex after kidney function slightly improved. Patient is admitted for hypotension, acute kidney injury, generalized weakness secondary to recurrent UTI and started on  IV. antibiotics.  Subjective:  She denies any complaints today, she reports good appetite, no shortness of breath, no chest pain, no dizziness or lightheadedness.  Assessment & Plan:   Active Problems:   AKI (acute kidney injury) (HCC)   Hypotension   Hypotension: -Lactic acid , white count within normal limits, appears to be euvolemic.    No evidence of sepsis on admission -She was appropriately hydrated and treated with IV fluids. -PCCM consulted blood pressure is slightly improved.  PCCM recommended continue monitoring in progressive. -Echocardiogram unremarkable EF 55 consistent grade 1 diastolic dysfunction. - TSH and cortisol level normal -No further orthostasis after increasing her midodrine doses. -Reviewing her medications, it does appear she was started on Aricept few months ago, and this may contribute to bradycardia and hypotension, so Aricept has been stopped.. -Encouraged to wear TED hose  CKD stage III/rhabdomyolysis -Recent creatinine around 2, but it does appear her baseline around one, was elevated to on admission, there is evidence of some rhabdomyolysis with elevated total CK, it is improving with  IV fluids, as well total CK trending down which is reassuring.   Generalized weakness -Treat hypotension and UTI,  - PT/OT evaluation  Recurrent UTI -Culture showing E. coli, treated with IV Rocephin   Peripheral edema -Evidence of DVT on venous Doppler, she will be put TED hose.  Elevated CK: -Trending down with hydration.   DVT prophylaxis: Heparin subcu Code Status: Full code Family Communication: D/W daughter by phone 03/01/2020 Disposition Plan:  Status is: Inpatient  Remains inpatient appropriate because:Hemodynamically unstable and Inpatient level of care appropriate due to severity of illness   Dispo: The patient is from: Home              Anticipated d/c is to: SNF              Anticipated d/c date is: 1 day              Patient currently is not medically stable to d/c.     Consultants:    PCCM  Procedures:  Antimicrobials:  Anti-infectives (From admission, onward)   Start     Dose/Rate Route Frequency Ordered Stop   02/27/20 2000  cefTRIAXone (ROCEPHIN) 1 g in sodium chloride 0.9 % 100 mL IVPB        1 g 200 mL/hr over 30 Minutes Intravenous Every 24 hours 02/26/20 1912 03/03/20 1959   02/26/20 1900  cefTRIAXone (ROCEPHIN) 1 g in sodium chloride 0.9 % 100 mL IVPB        1 g 200 mL/hr over 30 Minutes Intravenous  Once 02/26/20 1856 02/26/20 2107       Objective: Vitals:   03/02/20 0752 03/02/20 0756 03/02/20 0758 03/02/20 0800  BP: 95/67 99/67 (!) 82/54 96/70  Pulse: 74 78 91 77  Resp: 15 11 13 19   Temp:      TempSrc:      SpO2: 100% 100% 100% 100%  Weight:      Height:        Intake/Output Summary (Last 24 hours) at 03/02/2020 1119 Last data filed at 03/02/2020 0800 Gross per 24 hour  Intake 1859.6 ml  Output 700 ml  Net 1159.6 ml   Filed Weights   02/29/20 1631  Weight: 63.5 kg    Examination:  Awake Alert, Oriented X 3, No new F.N deficits, Normal affect Symmetrical Chest wall movement, Good air movement bilaterally,  CTAB RRR,No Gallops,Rubs or new Murmurs, No Parasternal Heave +ve B.Sounds, Abd Soft, No tenderness, No rebound - guarding or rigidity. No Cyanosis, Clubbing or edema, No new Rash or bruise       Data Reviewed: I have personally reviewed following labs and imaging studies  CBC: Recent Labs  Lab 02/26/20 1520 02/28/20 0159 02/29/20 0103 03/01/20 0103 03/02/20 0349  WBC 10.1 7.0 6.1 6.0 6.4  NEUTROABS 8.2*  --   --   --   --   HGB 10.4* 9.4* 9.0* 9.1* 8.9*  HCT 33.9* 30.8* 28.7* 29.0* 28.9*  MCV 93.4 95.7 92.9 92.1 92.9  PLT 249 205 217 243 252   Basic Metabolic Panel: Recent Labs  Lab 02/27/20 0316 02/28/20 0159 02/29/20 0103 03/01/20 0103 03/02/20 0349  NA 143 141 142 142 142  K 3.5 3.5 3.6 3.9 4.1  CL 113* 114* 112* 113* 112*  CO2 21* 18* 22 24 24   GLUCOSE 94 88 116* 119* 100*  BUN 27* 20 17 15 14   CREATININE 1.76* 1.48* 1.49* 1.57* 1.26*  CALCIUM 8.6* 8.9 8.8* 8.8* 8.8*  MG  --  2.1  --   --   --   PHOS  --  2.1*  --   --   --    GFR: Estimated Creatinine Clearance: 31.9 mL/min (A) (by C-G formula based on SCr of 1.26 mg/dL (H)). Liver Function Tests: Recent Labs  Lab 02/26/20 1520  AST 76*  ALT 42  ALKPHOS 63  BILITOT 0.8  PROT 7.1  ALBUMIN 3.7   No results for input(s): LIPASE, AMYLASE in the last 168 hours. No results for input(s): AMMONIA in the last 168 hours. Coagulation Profile: No results for input(s): INR, PROTIME in the last 168 hours. Cardiac Enzymes: Recent Labs  Lab 02/27/20 0316 02/28/20 0159 02/29/20 0103 03/01/20 0103 03/02/20 0349  CKTOTAL 1,775* 2,192* 1,647* 1,133* 640*   BNP (last 3 results) No results for input(s): PROBNP in the last 8760 hours. HbA1C: No results for input(s): HGBA1C in the last 72 hours. CBG: Recent Labs  Lab 02/27/20 0808 02/27/20 0936 02/27/20 2225  GLUCAP 59* 74 103*   Lipid Profile: No results for input(s): CHOL, HDL, LDLCALC, TRIG, CHOLHDL, LDLDIRECT in the last 72 hours. Thyroid  Function Tests: No results for input(s): TSH, T4TOTAL, FREET4, T3FREE, THYROIDAB in the last 72 hours. Anemia Panel: No results for input(s): VITAMINB12, FOLATE, FERRITIN, TIBC, IRON, RETICCTPCT in the last 72 hours. Sepsis Labs: Recent Labs  Lab 02/26/20 1520 02/27/20 0316  LATICACIDVEN 1.1 0.8    Recent Results (from the past 240 hour(s))  Respiratory Panel by RT PCR (Flu A&B, Covid) - Nasopharyngeal Swab     Status: None   Collection Time: 02/26/20  2:32 PM   Specimen: Nasopharyngeal Swab  Result Value Ref Range Status   SARS Coronavirus 2 by RT  PCR NEGATIVE NEGATIVE Final    Comment: (NOTE) SARS-CoV-2 target nucleic acids are NOT DETECTED.  The SARS-CoV-2 RNA is generally detectable in upper respiratoy specimens during the acute phase of infection. The lowest concentration of SARS-CoV-2 viral copies this assay can detect is 131 copies/mL. A negative result does not preclude SARS-Cov-2 infection and should not be used as the sole basis for treatment or other patient management decisions. A negative result may occur with  improper specimen collection/handling, submission of specimen other than nasopharyngeal swab, presence of viral mutation(s) within the areas targeted by this assay, and inadequate number of viral copies (<131 copies/mL). A negative result must be combined with clinical observations, patient history, and epidemiological information. The expected result is Negative.  Fact Sheet for Patients:  https://www.moore.com/  Fact Sheet for Healthcare Providers:  https://www.young.biz/  This test is no t yet approved or cleared by the [place] FDA and  has been authorized for detection and/or diagnosis of SARS-CoV-2 by FDA under an Emergency Use Authorization (EUA). This EUA will remain  in effect (meaning this test can be used) for the duration of the COVID-19 declaration under Section 564(b)(1) of the Act, 21  U.S.C. section 360bbb-3(b)(1), unless the authorization is terminated or revoked sooner.     Influenza A by PCR NEGATIVE NEGATIVE Final   Influenza B by PCR NEGATIVE NEGATIVE Final    Comment: (NOTE) The Xpert Xpress SARS-CoV-2/FLU/RSV assay is intended as an aid in  the diagnosis of influenza from Nasopharyngeal swab specimens and  should not be used as a sole basis for treatment. Nasal washings and  aspirates are unacceptable for Xpert Xpress SARS-CoV-2/FLU/RSV  testing.  Fact Sheet for Patients: https://www.moore.com/  Fact Sheet for Healthcare Providers: https://www.young.biz/  This test is not yet approved or cleared by the [place] FDA and  has been authorized for detection and/or diagnosis of SARS-CoV-2 by  FDA under an Emergency Use Authorization (EUA). This EUA will remain  in effect (meaning this test can be used) for the duration of the  Covid-19 declaration under Section 564(b)(1) of the Act, 21  U.S.C. section 360bbb-3(b)(1), unless the authorization is  terminated or revoked. Performed at [facility], [address], [place], [place] [address]   Culture, blood (routine x 2)     Status: None   Collection Time: 02/26/20  3:30 PM   Specimen: BLOOD  Result Value Ref Range Status   Specimen Description BLOOD SITE NOT SPECIFIED  Final   Special Requests   Final    BOTTLES DRAWN AEROBIC AND ANAEROBIC Blood Culture adequate volume   Culture   Final    NO GROWTH 5 DAYS Performed at [facility], [address], [place], [place] [address]    Report Status 03/02/2020 FINAL  Final  Culture, blood (routine x 2)     Status: None   Collection Time: 02/26/20  5:20 PM   Specimen: BLOOD RIGHT ARM  Result Value Ref Range Status   Specimen Description BLOOD RIGHT ARM  Final   Special Requests   Final    BOTTLES DRAWN AEROBIC AND ANAEROBIC Blood Culture adequate volume   Culture   Final    NO GROWTH 5 DAYS Performed  at [facility], [address] [place], [place] [address]    Report Status 03/02/2020 FINAL  Final  Urine culture     Status: Abnormal   Collection Time: 02/26/20  6:15 PM   Specimen: Urine, Random  Result Value  Ref Range Status   Specimen Description URINE, RANDOM  Final   Special Requests   Final    NONE Performed at [facility], [address], [place], [place] [address]    Culture >=100,000 COLONIES/mL ESCHERICHIA COLI (A)  Final   Report Status 02/29/2020 FINAL  Final   Organism ID, Bacteria ESCHERICHIA COLI (A)  Final      Susceptibility   Escherichia coli - MIC*    AMPICILLIN <=2 SENSITIVE Sensitive     CEFAZOLIN <=4 SENSITIVE Sensitive     CEFEPIME <=0.12 SENSITIVE Sensitive     CEFTRIAXONE <=0.25 SENSITIVE Sensitive     CIPROFLOXACIN <=0.25 SENSITIVE Sensitive     GENTAMICIN >=16 RESISTANT Resistant     IMIPENEM <=0.25 SENSITIVE Sensitive     NITROFURANTOIN <=16 SENSITIVE Sensitive     TRIMETH/SULFA >=320 RESISTANT Resistant     AMPICILLIN/SULBACTAM <=2 SENSITIVE Sensitive     PIP/TAZO <=4 SENSITIVE Sensitive     * >=100,000 COLONIES/mL ESCHERICHIA COLI         Radiology Studies: No results found. Scheduled Meds: . aspirin  81 mg Oral Daily  . heparin  5,000 Units Subcutaneous Q12H  . lidocaine  1 patch Transdermal Q24H  . midodrine  10 mg Oral BID WC  . multivitamin with minerals   Oral Daily  . senna-docusate  1 tablet Oral Daily   Continuous Infusions: . 0.9 % NaCl with KCl 20 mEq / L 50 mL/hr at 03/02/20 0445  . cefTRIAXone (ROCEPHIN)  IV 1 g (03/01/20 1951)     LOS: 5 days      Huey Bienenstock, MD [facility]   If 7PM-7AM, please contact night-coverage

## 2020-03-02 NOTE — Plan of Care (Signed)

## 2020-03-03 DIAGNOSIS — Z789 Other specified health status: Secondary | ICD-10-CM | POA: Diagnosis not present

## 2020-03-03 DIAGNOSIS — D649 Anemia, unspecified: Secondary | ICD-10-CM | POA: Diagnosis not present

## 2020-03-03 DIAGNOSIS — R1312 Dysphagia, oropharyngeal phase: Secondary | ICD-10-CM | POA: Diagnosis not present

## 2020-03-03 DIAGNOSIS — R2689 Other abnormalities of gait and mobility: Secondary | ICD-10-CM | POA: Diagnosis not present

## 2020-03-03 DIAGNOSIS — M1990 Unspecified osteoarthritis, unspecified site: Secondary | ICD-10-CM | POA: Diagnosis not present

## 2020-03-03 DIAGNOSIS — R5381 Other malaise: Secondary | ICD-10-CM | POA: Diagnosis not present

## 2020-03-03 DIAGNOSIS — R2681 Unsteadiness on feet: Secondary | ICD-10-CM | POA: Diagnosis not present

## 2020-03-03 DIAGNOSIS — Z7401 Bed confinement status: Secondary | ICD-10-CM | POA: Diagnosis not present

## 2020-03-03 DIAGNOSIS — Z4689 Encounter for fitting and adjustment of other specified devices: Secondary | ICD-10-CM | POA: Diagnosis not present

## 2020-03-03 DIAGNOSIS — N813 Complete uterovaginal prolapse: Secondary | ICD-10-CM | POA: Diagnosis not present

## 2020-03-03 DIAGNOSIS — N814 Uterovaginal prolapse, unspecified: Secondary | ICD-10-CM | POA: Diagnosis not present

## 2020-03-03 DIAGNOSIS — M6258 Muscle wasting and atrophy, not elsewhere classified, other site: Secondary | ICD-10-CM | POA: Diagnosis not present

## 2020-03-03 DIAGNOSIS — R278 Other lack of coordination: Secondary | ICD-10-CM | POA: Diagnosis not present

## 2020-03-03 DIAGNOSIS — R41841 Cognitive communication deficit: Secondary | ICD-10-CM | POA: Diagnosis not present

## 2020-03-03 DIAGNOSIS — E782 Mixed hyperlipidemia: Secondary | ICD-10-CM | POA: Diagnosis not present

## 2020-03-03 DIAGNOSIS — N390 Urinary tract infection, site not specified: Secondary | ICD-10-CM | POA: Diagnosis not present

## 2020-03-03 DIAGNOSIS — Z9181 History of falling: Secondary | ICD-10-CM | POA: Diagnosis not present

## 2020-03-03 DIAGNOSIS — R338 Other retention of urine: Secondary | ICD-10-CM | POA: Diagnosis not present

## 2020-03-03 DIAGNOSIS — N3001 Acute cystitis with hematuria: Secondary | ICD-10-CM | POA: Diagnosis not present

## 2020-03-03 DIAGNOSIS — N183 Chronic kidney disease, stage 3 unspecified: Secondary | ICD-10-CM | POA: Diagnosis not present

## 2020-03-03 DIAGNOSIS — M2550 Pain in unspecified joint: Secondary | ICD-10-CM | POA: Diagnosis not present

## 2020-03-03 DIAGNOSIS — N823 Fistula of vagina to large intestine: Secondary | ICD-10-CM | POA: Diagnosis not present

## 2020-03-03 DIAGNOSIS — I9589 Other hypotension: Secondary | ICD-10-CM | POA: Diagnosis not present

## 2020-03-03 DIAGNOSIS — R531 Weakness: Secondary | ICD-10-CM | POA: Diagnosis not present

## 2020-03-03 DIAGNOSIS — F0390 Unspecified dementia without behavioral disturbance: Secondary | ICD-10-CM | POA: Diagnosis not present

## 2020-03-03 DIAGNOSIS — I959 Hypotension, unspecified: Secondary | ICD-10-CM | POA: Diagnosis not present

## 2020-03-03 DIAGNOSIS — I5189 Other ill-defined heart diseases: Secondary | ICD-10-CM | POA: Diagnosis not present

## 2020-03-03 DIAGNOSIS — M6281 Muscle weakness (generalized): Secondary | ICD-10-CM | POA: Diagnosis not present

## 2020-03-03 DIAGNOSIS — N179 Acute kidney failure, unspecified: Secondary | ICD-10-CM | POA: Diagnosis not present

## 2020-03-03 DIAGNOSIS — F411 Generalized anxiety disorder: Secondary | ICD-10-CM | POA: Diagnosis not present

## 2020-03-03 DIAGNOSIS — K5900 Constipation, unspecified: Secondary | ICD-10-CM | POA: Diagnosis not present

## 2020-03-03 DIAGNOSIS — R627 Adult failure to thrive: Secondary | ICD-10-CM | POA: Diagnosis not present

## 2020-03-03 MED ORDER — MIDODRINE HCL 10 MG PO TABS
10.0000 mg | ORAL_TABLET | Freq: Two times a day (BID) | ORAL | Status: DC
Start: 2020-03-03 — End: 2020-04-04

## 2020-03-03 MED ORDER — SENNA-DOCUSATE SODIUM 8.6-50 MG PO TABS
2.0000 | ORAL_TABLET | Freq: Every day | ORAL | Status: DC
Start: 2020-03-03 — End: 2020-04-19

## 2020-03-03 NOTE — Discharge Summary (Signed)
[name], is a 79 y.o. female  DOB [birth year]  MRN [id number].  Admission date:  02/26/2020  Admitting Physician  Emeline General, MD  Discharge Date:  03/03/2020   Primary MD  Patient, No Pcp Per  Recommendations for primary care physician for things to follow:  -Please check CBC, CMP in 3 days. -Continue with thigh-high TED hose given her known orthostasis. -Blood pressure in the lower 90s at baseline .  Admission Diagnosis  Weakness [R53.1] Acute cystitis with hematuria [N30.01] AKI (acute kidney injury) (HCC) [N17.9] Hypotension [I95.9] Hypotension, unspecified hypotension type [I95.9]   Discharge Diagnosis  Weakness [R53.1] Acute cystitis with hematuria [N30.01] AKI (acute kidney injury) (HCC) [N17.9] Hypotension [I95.9] Hypotension, unspecified hypotension type [I95.9]    Active Problems:   AKI (acute kidney injury) (HCC)   Hypotension      Past Medical History:  Diagnosis Date  . Anemia    on iron  . Anxiety   . Arthritis    shoulders, knees, hands, hips; status post left knee surgery  . Chronic bronchitis (HCC)    CXR 07/08/16  . Depression   . GERD (gastroesophageal reflux disease)    TAKES TUMS  . H/O: pneumonia 2003  . Hyperlipidemia    on med  . Hypertension   . Morbid obesity with BMI of 45.0-49.9, adult (HCC)   . Shingles    recent 02/2017  . Thoracic aortic atherosclerosis (HCC)    CXR 07/08/16    Past Surgical History:  Procedure Laterality Date  . CARDIAC CATHETERIZATION  04/20/1996   patent coronaries, EF 74% (Dr. Aram Candela)  . CARPAL TUNNEL RELEASE Left   . CATARACT EXTRACTION, BILATERAL    . COLONOSCOPY    . KNEE SURGERY Left 11/2001  . NM MYOVIEW LTD  06/17/2018   EF 60-65 %.  Apical thinning.  LOW RISK no ischemia or infarction.  . REPLACEMENT TOTAL KNEE Left 10/2009  . REPLACEMENT TOTAL KNEE Left 10/2009  . SHOULDER OPEN ROTATOR CUFF REPAIR   03/01/2012   Procedure: ROTATOR CUFF REPAIR SHOULDER OPEN;  Surgeon: Mable Paris, MD;  Location: [facility] OR;  Service: Orthopedics;  Laterality: Left;  SUBSCAPULARIS REPAIR  . SVD     x 7  . TOTAL HIP ARTHROPLASTY Right 07/24/2016  . TOTAL HIP ARTHROPLASTY Right 07/24/2016   Procedure: RIGHT TOTAL HIP ARTHROPLASTY ANTERIOR APPROACH;  Surgeon: [name], MD;  Location: [facility] OR;  Service: Orthopedics;  Laterality: Right;  . TOTAL HIP ARTHROPLASTY Left 04/09/2017   Procedure: LEFT TOTAL HIP ARTHROPLASTY ANTERIOR APPROACH;  Surgeon: [name], MD;  Location: [facility] ORS;  Service: Orthopedics;  Laterality: Left;  . TOTAL SHOULDER ARTHROPLASTY  01/18/2012   Procedure: TOTAL SHOULDER ARTHROPLASTY;  Surgeon: Mable Paris, MD;  Location: [facility] OR;  Service: Orthopedics;  Laterality: Left;  . TOTAL SHOULDER REPLACEMENT Left 01/19/2012  . TRANSTHORACIC ECHOCARDIOGRAM  06/16/2018   EF 60 to 65%.  GR 1 DD.  Normal mitral valve.  Mild aortic sclerosis but no stenosis.  . TUBAL  LIGATION    . WISDOM TOOTH EXTRACTION         History of present illness and  Hospital Course:     Kindly see H&P for history of present illness and admission details, please review complete Labs, Consult reports and Test reports for all details in brief  HPI  from the history and physical done on the day of admission 02/26/2020  HPI: [name] is a 79 y.o. female with medical history significant of HTN, dementia, HLD, presented with generalized weakness and frequent falls.  Symptoms started 2-3 months ago, and gradually getting worse.  Came to ED about 6 weeks ago with a near syncope episode was found to have a UTI, treated with 5 days of Keflex.  During the same ER visit, it was found the patient blood pressure on the lower side, kidney function evaluated, as per ER record lisinopril was discontinued.  Daughter at bedside confirmed that lisinopril was never restarted after last ER visit.  Symptoms improved  afterwards however started getting worse for last 2 weeks with recurrent generalized weakness, frequent falls.  Denied any loss of consciousness, admitted sometimes has lightheadedness.  Daughter at bedside reported patient has been having unsteady gait was recommended by her neurology for PT evaluation which was not started yet.  Daughter also reported patient has had decreasing appetite, no abdominal pain, and daughter has been checking patient blood pressure at home and systolic was in the 70 to 80s range in the last 2 weeks.  Daughter reported patient used to weigh >200 lbs last year and he states her weight down to 140 LBS, and patient has been complaining of feeling cold all the time. She denied any diarrhea, no urinary symptoms no abdominal pain.  No fever chills no cough. ED Course: Systolic blood pressure in the 80s, creatinine 2.0 compared to 2.1, 3 weeks ago.  Hospital Course   Hypotension: -Lactic acid , white count within normal limits, appears to be euvolemic.  No evidence of sepsis on admission -She was appropriately hydrated and treated with IV fluids. -PCCM consulted blood pressure is slightly improved.  PCCM recommended continue monitoring in progressive. -Echocardiogram unremarkable EF 55 consistent grade 1 diastolic dysfunction. - TSH and cortisol level normal -Despite appropriate IV hydration, she did remain orthostatic with soft blood pressure, for which she has started on Midodrin, her dose has been uptitrated, she is currently on 10 mg oral twice daily, blood pressure remains soft, but this appears to be her baseline, she is tolerating tolerated blood pressure in upper 80s to lower 90s. -Reviewing her medications, it does appear she was started on Aricept few months ago, and this may contribute to bradycardia and hypotension, so Aricept has been stopped.. -Encouraged to wear TED hose  CKD stage III/rhabdomyolysis -Recent creatinine around 2, but it does appear her  baseline around one, was elevated to on admission, there is evidence of some rhabdomyolysis with elevated total CK, it is improving with IV fluids, as well total CK trending down which is reassuring.   Generalized weakness -Treat hypotension and UTI, plan for SNF placement  Recurrent UTI -Culture showing E. coli, treated with IV Rocephin , appears to be chronically on trimethoprim, resumed on discharge  Peripheral edema -Evidence of DVT on venous Doppler, she will be put TED hose.  Dementia -Aricept has been stopped due to hypotension     Discharge Condition:  stable   Follow UP   Contact information for after-discharge care    Destination  [facility]erred [facility] .   Service: Skilled Nursing Contact information: [address] [place] [address] [phone]                    Discharge Instructions  and  Discharge Medications     Discharge Instructions    Discharge instructions   Complete by: As directed    Follow with Primary MD  Or SNF physician  Get CBC, CMP,checked  by Primary MD next visit.    Activity: As tolerated with Full fall precautions use walker/cane & assistance as needed   Disposition SNF   Diet: Heart Healthy  , with feeding assistance and aspiration precautions.   On your next visit with your primary care physician please Get Medicines reviewed and adjusted.   Please request your Prim.MD to go over all Hospital Tests and Procedure/Radiological results at the follow up, please get all Hospital records sent to your Prim MD by signing hospital release before you go home.   If you experience worsening of your admission symptoms, develop shortness of breath, life threatening emergency, suicidal or homicidal thoughts you must seek medical attention immediately by calling 911 or calling your MD immediately  if symptoms less severe.  You Must read complete instructions/literature along with all the possible  adverse reactions/side effects for all the Medicines you take and that have been prescribed to you. Take any new Medicines after you have completely understood and accpet all the possible adverse reactions/side effects.   Do not drive, operating heavy machinery, perform activities at heights, swimming or participation in water activities or provide baby sitting services if your were admitted for syncope or siezures until you have seen by Primary MD or a Neurologist and advised to do so again.  Do not drive when taking Pain medications.    Do not take more than prescribed Pain, Sleep and Anxiety Medications  Special Instructions: If you have smoked or chewed Tobacco  in the last 2 yrs please stop smoking, stop any regular Alcohol  and or any Recreational drug use.  Wear Seat belts while driving.   Please note  You were cared for by a hospitalist during your hospital stay. If you have any questions about your discharge medications or the care you received while you were in the hospital after you are discharged, you can call the unit and asked to speak with the hospitalist on call if the hospitalist that took care of you is not available. Once you are discharged, your primary care physician will handle any further medical issues. Please note that NO REFILLS for any discharge medications will be authorized once you are discharged, as it is imperative that you return to your primary care physician (or establish a relationship with a primary care physician if you do not have one) for your aftercare needs so that they can reassess your need for medications and monitor your lab values.   Increase activity slowly   Complete by: As directed      Allergies as of 03/03/2020      Reactions   Effexor [venlafaxine Hcl] Swelling, Other (See Comments)   MD ADVISES PATIENT TO NOT TAKE IN FUTURE PT STOPPED, MD DC'd MED   Sulfa Drugs Cross Reactors Itching      Medication List    STOP taking these  medications   donepezil 10 MG tablet Commonly known as: ARICEPT   lisinopril 10 MG tablet Commonly known as: ZESTRIL     TAKE these medications  acetaminophen 500 MG tablet Commonly known as: TYLENOL Take 500 mg by mouth every 6 (six) hours as needed for mild pain.   aspirin 81 MG chewable tablet Chew 81 mg by mouth daily.   hydrOXYzine 25 MG tablet Commonly known as: ATARAX/VISTARIL Take 1 tablet (25 mg total) by mouth 3 (three) times daily as needed. What changed: reasons to take this   midodrine 10 MG tablet Commonly known as: PROAMATINE Take 1 tablet (10 mg total) by mouth 2 (two) times daily with a meal.   MULTI-VITAMIN GUMMIES PO Take 1 tablet by mouth daily.   polyethylene glycol powder 17 GM/SCOOP powder Commonly known as: GLYCOLAX/MIRALAX Mix 1 capful of powder in drink and take by mouth one to three times daily as needed for daily soft stools  OTC What changed:   how much to take  how to take this  when to take this  reasons to take this  additional instructions   rosuvastatin 40 MG tablet Commonly known as: CRESTOR Take 1 tablet (40 mg total) by mouth daily.   sennosides-docusate sodium 8.6-50 MG tablet Commonly known as: SENOKOT-S Take 2 tablets by mouth daily. What changed: how much to take   trimethoprim 100 MG tablet Commonly known as: TRIMPEX Take 1 tablet (100 mg total) by mouth daily.         Diet and Activity recommendation: See Discharge Instructions above   Consults obtained - PCCM   Major procedures and Radiology Reports - PLEASE review detailed and final reports for all details, in brief -      CT Head Wo Contrast  Result Date: 02/26/2020 CLINICAL DATA:  Fall EXAM: CT HEAD WITHOUT CONTRAST TECHNIQUE: Contiguous axial images were obtained from the base of the skull through the vertex without intravenous contrast. COMPARISON:  02/10/2020 FINDINGS: Brain: There is atrophy and chronic small vessel disease changes. No  acute intracranial abnormality. Specifically, no hemorrhage, hydrocephalus, mass lesion, acute infarction, or significant intracranial injury. Vascular: No hyperdense vessel or unexpected calcification. Skull: No acute calvarial abnormality. Sinuses/Orbits: Visualized paranasal sinuses and mastoids clear. Orbital soft tissues unremarkable. Other: None IMPRESSION: Atrophy, chronic microvascular disease. No acute intracranial abnormality. Electronically Signed   By: Charlett Nose M.D.   On: 02/26/2020 15:08   CT Head Wo Contrast  Result Date: 02/10/2020 CLINICAL DATA:  Head trauma.  Syncopal episode. EXAM: CT HEAD WITHOUT CONTRAST TECHNIQUE: Contiguous axial images were obtained from the base of the skull through the vertex without intravenous contrast. COMPARISON:  MRI of the head August 07, 2019 FINDINGS: Brain: No evidence of acute infarction, hemorrhage, hydrocephalus, extra-axial collection or mass lesion/mass effect. Minimal deep white matter microangiopathy. Vascular: Calcific atherosclerotic disease of the intra cavernous carotid arteries. Skull: Normal. Negative for fracture or focal lesion. Sinuses/Orbits: No acute finding. Other: None. IMPRESSION: 1. No acute intracranial abnormality. 2. Minimal chronic microvascular disease. Electronically Signed   By: Ted Mcalpine M.D.   On: 02/10/2020 14:46   CT Cervical Spine Wo Contrast  Result Date: 02/10/2020 CLINICAL DATA:  Neck trauma post syncopal episode. EXAM: CT CERVICAL SPINE WITHOUT CONTRAST TECHNIQUE: Multidetector CT imaging of the cervical spine was performed without intravenous contrast. Multiplanar CT image reconstructions were also generated. COMPARISON:  None. FINDINGS: Alignment: If Arctic positioning of the cervical spine likely positional. Mild posterior listhesis of C4 on C5, likely degenerative. Skull base and vertebrae: No acute fracture. No primary bone lesion or focal pathologic process. Congenital non fusion of the posterior  process of C7 and T1. Soft  tissues and spinal canal: No prevertebral fluid or swelling. No visible canal hematoma. Disc levels: Multilevel osteoarthritic changes of the cervical spine. Upper chest: Negative. Other: None. IMPRESSION: 1. No evidence of acute traumatic injury to the cervical spine. 2. Multilevel osteoarthritic changes of the cervical spine. 3. Mild posterior listhesis of C4 on C5, likely degenerative. Electronically Signed   By: Ted Mcalpine M.D.   On: 02/10/2020 14:52   US RENAL  Result Date: 02/26/2020 CLINICAL DATA:  Acute renal insufficiency EXAM: RENAL / URINARY TRACT ULTRASOUND COMPLETE COMPARISON:  None. FINDINGS: Right Kidney: Renal measurements: 8.3 x 4.4 x 5.0 cm = volume: 101 mL. Echogenicity within normal limits. No mass or hydronephrosis visualized. Three simple cortical cysts are seen scattered throughout the right kidney. Left Kidney: Renal measurements: 9.0 x 5.2 x 5.4 cm = volume: 134 mL. There is limited visualization of the lower pole of the left kidney due to overlying bowel gas. Echogenicity within normal limits. No mass or hydronephrosis visualized. Bladder: Appears normal for degree of bladder distention. Other: None. IMPRESSION: Slightly limited but unremarkable examination of the kidneys. Electronically Signed   By: Helyn Numbers MD   On: 02/26/2020 23:07   DG Chest Port 1 View  Result Date: 02/26/2020 CLINICAL DATA:  79 year old female with weakness and fall to the floor. EXAM: PORTABLE CHEST 1 VIEW COMPARISON:  Chest radiographs 06/16/2018 and earlier. FINDINGS: Portable AP semi upright view at 1439 hours. Stable cardiomegaly and tortuous thoracic aorta. Other mediastinal contours are within normal limits. Lung volumes are within normal limits. Allowing for portable technique the lungs are clear. No pneumothorax. Visualized tracheal air column is within normal limits. Stable visualized osseous structures, including advanced degenerative changes at the  shoulders and previous left shoulder arthroplasty. Negative visible bowel gas pattern. IMPRESSION: No acute cardiopulmonary abnormality or acute traumatic injury identified. Electronically Signed   By: Odessa Fleming M.D.   On: 02/26/2020 14:48   ECHOCARDIOGRAM COMPLETE  Result Date: 02/27/2020    ECHOCARDIOGRAM REPORT   Patient Name:   [name] Date of Exam: 02/27/2020 Medical Rec #:  [id number]      Height:       61.0 in Accession #:    [id number]     Weight:       140.0 lb Date of Birth:  07/17/1940      BSA:          1.623 m Patient Age:    79 years       BP:           80/52 mmHg Patient Gender: F              HR:           63 bpm. Exam Location:  Inpatient Procedure: 2D Echo and Intracardiac Opacification Agent Indications:    Murmur 785.2 / R01.1  History:        Patient has prior history of Echocardiogram examinations, most                 recent 06/16/2018. Signs/Symptoms:dementia; Risk                 Factors:Hypertension and Dyslipidemia. 6 weeks ago had a near                 syncopal event.  Sonographer:    Leta Jungling RDCS Referring Phys: [id number] Emeline General IMPRESSIONS  1. Left ventricular ejection fraction, by estimation, is 55%. The left ventricle has normal  function. The left ventricle has no regional wall motion abnormalities. There is mild asymmetric left ventricular hypertrophy of the basal-septal segment. Left ventricular diastolic parameters are consistent with Grade I diastolic dysfunction (impaired relaxation).  2. Right ventricular systolic function is normal. The right ventricular size is mildly enlarged. There is normal pulmonary artery systolic pressure. The estimated right ventricular systolic pressure is 27.4 mmHg.  3. The mitral valve is normal in structure. No evidence of mitral valve regurgitation. No evidence of mitral stenosis.  4. The aortic valve is normal in structure. Aortic valve regurgitation is trivial. No aortic stenosis is present.  5. The inferior vena cava is normal  in size with greater than 50% respiratory variability, suggesting right atrial pressure of 3 mmHg. FINDINGS  Left Ventricle: Left ventricular ejection fraction, by estimation, is 55%. The left ventricle has normal function. The left ventricle has no regional wall motion abnormalities. Definity contrast agent was given IV to delineate the left ventricular endocardial borders. The left ventricular internal cavity size was normal in size. There is mild asymmetric left ventricular hypertrophy of the basal-septal segment. Left ventricular diastolic parameters are consistent with Grade I diastolic dysfunction (impaired relaxation). Right Ventricle: The right ventricular size is mildly enlarged. No increase in right ventricular wall thickness. Right ventricular systolic function is normal. There is normal pulmonary artery systolic pressure. The tricuspid regurgitant velocity is 2.47  m/s, and with an assumed right atrial pressure of 3 mmHg, the estimated right ventricular systolic pressure is 27.4 mmHg. Left Atrium: Left atrial size was normal in size. Right Atrium: Right atrial size was normal in size. Pericardium: There is no evidence of pericardial effusion. Mitral Valve: The mitral valve is normal in structure. No evidence of mitral valve regurgitation. No evidence of mitral valve stenosis. Tricuspid Valve: The tricuspid valve is normal in structure. Tricuspid valve regurgitation is mild . No evidence of tricuspid stenosis. Aortic Valve: The aortic valve is normal in structure. Aortic valve regurgitation is trivial. No aortic stenosis is present. Pulmonic Valve: The pulmonic valve was normal in structure. Pulmonic valve regurgitation is not visualized. No evidence of pulmonic stenosis. Aorta: The aortic root is normal in size and structure. Venous: The inferior vena cava is normal in size with greater than 50% respiratory variability, suggesting right atrial pressure of 3 mmHg. IAS/Shunts: No atrial level shunt  detected by color flow Doppler.  LEFT VENTRICLE PLAX 2D LVIDd:         3.60 cm      Diastology LVIDs:         2.50 cm      LV e' medial:    5.44 cm/s LV PW:         0.90 cm      LV E/e' medial:  9.2 LV IVS:        1.10 cm      LV e' lateral:   7.18 cm/s LVOT diam:     1.90 cm      LV E/e' lateral: 7.0 LV SV:         60 LV SV Index:   37 LVOT Area:     2.84 cm  LV Volumes (MOD) LV vol d, MOD A2C: 94.7 ml LV vol d, MOD A4C: 110.0 ml LV vol s, MOD A2C: 38.5 ml LV vol s, MOD A4C: 44.3 ml LV SV MOD A2C:     56.2 ml LV SV MOD A4C:     110.0 ml LV SV MOD BP:  63.7 ml RIGHT VENTRICLE RV S prime:     15.60 cm/s TAPSE (M-mode): 2.0 cm LEFT ATRIUM             Index       RIGHT ATRIUM           Index LA diam:        1.60 cm 0.99 cm/m  RA Area:     15.70 cm LA Vol (A2C):   20.0 ml 12.34 ml/m RA Volume:   42.30 ml  26.06 ml/m LA Vol (A4C):   45.2 ml 27.85 ml/m LA Biplane Vol: 34.5 ml 21.26 ml/m  AORTIC VALVE LVOT Vmax:   106.00 cm/s LVOT Vmean:  84.000 cm/s LVOT VTI:    0.210 m  AORTA Ao Root diam: 2.90 cm MITRAL VALVE               TRICUSPID VALVE MV Area (PHT): 2.48 cm    TR Peak grad:   24.4 mmHg MV Decel Time: 306 msec    TR Vmax:        247.00 cm/s MV E velocity: 50.10 cm/s MV A velocity: 63.00 cm/s  SHUNTS MV E/A ratio:  0.80        Systemic VTI:  0.21 m                            Systemic Diam: 1.90 cm Weston Brass MD Electronically signed by Weston Brass MD Signature Date/Time: 02/27/2020/10:04:40 AM    Final    VAS [place] LOWER EXTREMITY VENOUS (DVT)  Result Date: 02/28/2020  Lower Venous DVT Study Indications: Edema.  Comparison Study: no prior Performing Technologist: Blanch Media RVS  Examination Guidelines: A complete evaluation includes B-mode imaging, spectral Doppler, color Doppler, and power Doppler as needed of all accessible portions of each vessel. Bilateral testing is considered an integral part of a complete examination. Limited examinations for reoccurring indications may be performed as  noted. The reflux portion of the exam is performed with the patient in reverse Trendelenburg.  +---------+---------------+---------+-----------+----------+-------------------+ RIGHT    CompressibilityPhasicitySpontaneityPropertiesThrombus Aging      +---------+---------------+---------+-----------+----------+-------------------+ CFV      Full           Yes      Yes                                      +---------+---------------+---------+-----------+----------+-------------------+ SFJ      Full                                                             +---------+---------------+---------+-----------+----------+-------------------+ FV Prox  Full                                                             +---------+---------------+---------+-----------+----------+-------------------+ FV Mid   Full                                                             +---------+---------------+---------+-----------+----------+-------------------+  FV DistalFull                                                             +---------+---------------+---------+-----------+----------+-------------------+ PFV      Full                                                             +---------+---------------+---------+-----------+----------+-------------------+ POP      Full           Yes      Yes                                      +---------+---------------+---------+-----------+----------+-------------------+ PTV      Full                                                             +---------+---------------+---------+-----------+----------+-------------------+ PERO                                                  Not well visualized +---------+---------------+---------+-----------+----------+-------------------+   +---------+---------------+---------+-----------+----------+-------------------+ LEFT     CompressibilityPhasicitySpontaneityPropertiesThrombus  Aging      +---------+---------------+---------+-----------+----------+-------------------+ CFV      Full           Yes      Yes                                      +---------+---------------+---------+-----------+----------+-------------------+ SFJ      Full                                                             +---------+---------------+---------+-----------+----------+-------------------+ FV Prox  Full                                                             +---------+---------------+---------+-----------+----------+-------------------+ FV Mid   Full                                                             +---------+---------------+---------+-----------+----------+-------------------+ FV DistalFull                                                             +---------+---------------+---------+-----------+----------+-------------------+  PFV      Full                                                             +---------+---------------+---------+-----------+----------+-------------------+ POP      Full           Yes      Yes                                      +---------+---------------+---------+-----------+----------+-------------------+ PTV      Full                                                             +---------+---------------+---------+-----------+----------+-------------------+ PERO                                                  Not well visualized +---------+---------------+---------+-----------+----------+-------------------+     Summary: BILATERAL: - No evidence of deep vein thrombosis seen in the lower extremities, bilaterally. - No evidence of superficial venous thrombosis in the lower extremities, bilaterally. -   *See table(s) above for measurements and observations. Electronically signed by Coral Else MD on 02/28/2020 at 8:20:36 PM.    Final     Micro Results     Recent Results (from the past 240  hour(s))  Respiratory Panel by RT PCR (Flu A&B, Covid) - Nasopharyngeal Swab     Status: None   Collection Time: 02/26/20  2:32 PM   Specimen: Nasopharyngeal Swab  Result Value Ref Range Status   SARS Coronavirus 2 by RT PCR NEGATIVE NEGATIVE Final    Comment: (NOTE) SARS-CoV-2 target nucleic acids are NOT DETECTED.  The SARS-CoV-2 RNA is generally detectable in upper respiratoy specimens during the acute phase of infection. The lowest concentration of SARS-CoV-2 viral copies this assay can detect is 131 copies/mL. A negative result does not preclude SARS-Cov-2 infection and should not be used as the sole basis for treatment or other patient management decisions. A negative result may occur with  improper specimen collection/handling, submission of specimen other than nasopharyngeal swab, presence of viral mutation(s) within the areas targeted by this assay, and inadequate number of viral copies (<131 copies/mL). A negative result must be combined with clinical observations, patient history, and epidemiological information. The expected result is Negative.  Fact Sheet for Patients:  https://www.moore.com/  Fact Sheet for Healthcare Providers:  https://www.young.biz/  This test is no t yet approved or cleared by the [place] FDA and  has been authorized for detection and/or diagnosis of SARS-CoV-2 by FDA under an Emergency Use Authorization (EUA). This EUA will remain  in effect (meaning this test can be used) for the duration of the COVID-19 declaration under Section 564(b)(1) of the Act, 21 U.S.C. section 360bbb-3(b)(1), unless the authorization is terminated or revoked sooner.     Influenza A by PCR NEGATIVE NEGATIVE Final   Influenza B by PCR  NEGATIVE NEGATIVE Final    Comment: (NOTE) The Xpert Xpress SARS-CoV-2/FLU/RSV assay is intended as an aid in  the diagnosis of influenza from Nasopharyngeal swab specimens and  should not  be used as a sole basis for treatment. Nasal washings and  aspirates are unacceptable for Xpert Xpress SARS-CoV-2/FLU/RSV  testing.  Fact Sheet for Patients: https://www.moore.com/  Fact Sheet for Healthcare Providers: https://www.young.biz/  This test is not yet approved or cleared by the [place] FDA and  has been authorized for detection and/or diagnosis of SARS-CoV-2 by  FDA under an Emergency Use Authorization (EUA). This EUA will remain  in effect (meaning this test can be used) for the duration of the  Covid-19 declaration under Section 564(b)(1) of the Act, 21  U.S.C. section 360bbb-3(b)(1), unless the authorization is  terminated or revoked. Performed at [facility], [address], [place], [place] [address]   Culture, blood (routine x 2)     Status: None   Collection Time: 02/26/20  3:30 PM   Specimen: BLOOD  Result Value Ref Range Status   Specimen Description BLOOD SITE NOT SPECIFIED  Final   Special Requests   Final    BOTTLES DRAWN AEROBIC AND ANAEROBIC Blood Culture adequate volume   Culture   Final    NO GROWTH 5 DAYS Performed at [facility], [address], [place], [place] [address]    Report Status 03/02/2020 FINAL  Final  Culture, blood (routine x 2)     Status: None   Collection Time: 02/26/20  5:20 PM   Specimen: BLOOD RIGHT ARM  Result Value Ref Range Status   Specimen Description BLOOD RIGHT ARM  Final   Special Requests   Final    BOTTLES DRAWN AEROBIC AND ANAEROBIC Blood Culture adequate volume   Culture   Final    NO GROWTH 5 DAYS Performed at [facility], [address], [place], [place] [address]    Report Status 03/02/2020 FINAL  Final  Urine culture     Status: Abnormal   Collection Time: 02/26/20  6:15 PM   Specimen: Urine, Random  Result Value Ref Range Status   Specimen Description URINE, RANDOM  Final   Special Requests   Final    NONE Performed at [facility], [address] [place], [place] [address]    Culture >=100,000 COLONIES/mL ESCHERICHIA COLI (A)  Final   Report Status 02/29/2020 FINAL  Final   Organism ID, Bacteria ESCHERICHIA COLI (A)  Final      Susceptibility   Escherichia coli - MIC*    AMPICILLIN <=2 SENSITIVE Sensitive     CEFAZOLIN <=4 SENSITIVE Sensitive     CEFEPIME <=0.12 SENSITIVE Sensitive     CEFTRIAXONE <=0.25 SENSITIVE Sensitive     CIPROFLOXACIN <=0.25 SENSITIVE Sensitive     GENTAMICIN >=16 RESISTANT Resistant     IMIPENEM <=0.25 SENSITIVE Sensitive     NITROFURANTOIN <=16 SENSITIVE Sensitive     TRIMETH/SULFA >=320 RESISTANT Resistant     AMPICILLIN/SULBACTAM <=2 SENSITIVE Sensitive     PIP/TAZO <=4 SENSITIVE Sensitive     * >=100,000 COLONIES/mL ESCHERICHIA COLI       Today   Subjective:   Leiana Rund today has no headache,no chest or abdominal pain, denies fever, chills or shortness of breath, reports appetite has improved.  Objective:   Blood pressure 96/62, pulse 65, temperature 98.4 F (36.9 C), temperature source Oral, resp. rate 15, height 5' 0.98" (1.549 m),  weight 63.5 kg, SpO2 100 %.   Intake/Output Summary (Last 24 hours) at 03/03/2020 1011 Last data filed at 03/03/2020 0400 Gross per 24 hour  Intake 559.97 ml  Output 1700 ml  Net -1140.03 ml    Exam Awake Alert, pleasant, answering question appropriately . Symmetrical Chest wall movement, Good air movement bilaterally, CTAB RRR,No Gallops,Rubs or new Murmurs, No Parasternal Heave +ve B.Sounds, Abd Soft, Non tender, No organomegaly appriciated, No rebound -guarding or rigidity. No Cyanosis, Clubbing , wearing TED hose  Data Review   CBC w Diff:  Lab Results  Component Value Date   WBC 6.4 03/02/2020   HGB 8.9 (L) 03/02/2020   HGB 11.9 08/11/2017   HCT 28.9 (L) 03/02/2020   HCT 36.7 08/11/2017   PLT 252 03/02/2020   PLT 280 08/11/2017   LYMPHOPCT 10 02/26/2020   MONOPCT 8 02/26/2020   EOSPCT 0 02/26/2020    BASOPCT 0 02/26/2020    CMP:  Lab Results  Component Value Date   NA 142 03/02/2020   NA 141 08/21/2019   K 4.1 03/02/2020   CL 112 (H) 03/02/2020   CO2 24 03/02/2020   BUN 14 03/02/2020   BUN 18 08/21/2019   CREATININE 1.26 (H) 03/02/2020   CREATININE 0.66 10/22/2015   PROT 7.1 02/26/2020   PROT 7.0 02/21/2018   ALBUMIN 3.7 02/26/2020   ALBUMIN 4.2 02/21/2018   BILITOT 0.8 02/26/2020   BILITOT 0.3 02/21/2018   ALKPHOS 63 02/26/2020   AST 76 (H) 02/26/2020   ALT 42 02/26/2020  .   Total Time in preparing paper work, data evaluation and todays exam - 35 minutes  Huey Bienenstock M.D on 03/03/2020 at 10:11 AM  [facility]   Office  [phone]

## 2020-03-03 NOTE — Discharge Instructions (Signed)
Follow with Primary MD  Or SNF physician  Get CBC, CMP,checked  by Primary MD next visit.    Activity: As tolerated with Full fall precautions use walker/cane & assistance as needed   Disposition SNF   Diet: Heart Healthy  , with feeding assistance and aspiration precautions.   On your next visit with your primary care physician please Get Medicines reviewed and adjusted.   Please request your Prim.MD to go over all Hospital Tests and Procedure/Radiological results at the follow up, please get all Hospital records sent to your Prim MD by signing hospital release before you go home.   If you experience worsening of your admission symptoms, develop shortness of breath, life threatening emergency, suicidal or homicidal thoughts you must seek medical attention immediately by calling 911 or calling your MD immediately  if symptoms less severe.  You Must read complete instructions/literature along with all the possible adverse reactions/side effects for all the Medicines you take and that have been prescribed to you. Take any new Medicines after you have completely understood and accpet all the possible adverse reactions/side effects.   Do not drive, operating heavy machinery, perform activities at heights, swimming or participation in water activities or provide baby sitting services if your were admitted for syncope or siezures until you have seen by Primary MD or a Neurologist and advised to do so again.  Do not drive when taking Pain medications.    Do not take more than prescribed Pain, Sleep and Anxiety Medications  Special Instructions: If you have smoked or chewed Tobacco  in the last 2 yrs please stop smoking, stop any regular Alcohol  and or any Recreational drug use.  Wear Seat belts while driving.   Please note  You were cared for by a hospitalist during your hospital stay. If you have any questions about your discharge medications or the care you received while you were  in the hospital after you are discharged, you can call the unit and asked to speak with the hospitalist on call if the hospitalist that took care of you is not available. Once you are discharged, your primary care physician will handle any further medical issues. Please note that NO REFILLS for any discharge medications will be authorized once you are discharged, as it is imperative that you return to your primary care physician (or establish a relationship with a primary care physician if you do not have one) for your aftercare needs so that they can reassess your need for medications and monitor your lab values.

## 2020-03-03 NOTE — TOC Transition Note (Signed)
Transition of Care [facility]) - CM/SW Discharge Note   Patient Details  Name: [name] MRN: [id number] Date of Birth: 11/23/1940  [facility] CM/SW Contact:  Carley Hammed, LCSWA Phone Number: 03/03/2020, 10:31 AM   Clinical Narrative:    Nurse to call report to [phone]. 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

## 2020-03-03 NOTE — Progress Notes (Addendum)
Attempted to call report to [facility] with no answer. Will attempt again at a later time.  1416: again attempted to call report to [facility] with no answer.

## 2020-03-04 ENCOUNTER — Telehealth: Payer: Self-pay | Admitting: Neurology

## 2020-03-04 DIAGNOSIS — N390 Urinary tract infection, site not specified: Secondary | ICD-10-CM | POA: Diagnosis not present

## 2020-03-04 DIAGNOSIS — F0390 Unspecified dementia without behavioral disturbance: Secondary | ICD-10-CM | POA: Diagnosis not present

## 2020-03-04 DIAGNOSIS — R2681 Unsteadiness on feet: Secondary | ICD-10-CM | POA: Diagnosis not present

## 2020-03-04 DIAGNOSIS — I959 Hypotension, unspecified: Secondary | ICD-10-CM | POA: Diagnosis not present

## 2020-03-04 DIAGNOSIS — E782 Mixed hyperlipidemia: Secondary | ICD-10-CM | POA: Diagnosis not present

## 2020-03-04 DIAGNOSIS — N179 Acute kidney failure, unspecified: Secondary | ICD-10-CM | POA: Diagnosis not present

## 2020-03-04 DIAGNOSIS — M6281 Muscle weakness (generalized): Secondary | ICD-10-CM | POA: Diagnosis not present

## 2020-03-04 DIAGNOSIS — F411 Generalized anxiety disorder: Secondary | ICD-10-CM | POA: Diagnosis not present

## 2020-03-04 DIAGNOSIS — M1990 Unspecified osteoarthritis, unspecified site: Secondary | ICD-10-CM | POA: Diagnosis not present

## 2020-03-04 DIAGNOSIS — Z0279 Encounter for issue of other medical certificate: Secondary | ICD-10-CM

## 2020-03-04 NOTE — Telephone Encounter (Signed)
Patient's daughter called in stating the patient fell last week and is in a rehab center. The doctor stated her blood pressure was low and took her off of the donepezil. Patient also had a UTI at the end of October. 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

## 2020-03-05 DIAGNOSIS — F0390 Unspecified dementia without behavioral disturbance: Secondary | ICD-10-CM | POA: Diagnosis not present

## 2020-03-05 DIAGNOSIS — I959 Hypotension, unspecified: Secondary | ICD-10-CM | POA: Diagnosis not present

## 2020-03-05 DIAGNOSIS — R5381 Other malaise: Secondary | ICD-10-CM | POA: Diagnosis not present

## 2020-03-05 DIAGNOSIS — R627 Adult failure to thrive: Secondary | ICD-10-CM | POA: Diagnosis not present

## 2020-03-05 NOTE — Telephone Encounter (Signed)
Noted. Pls confirm with daughters how much intermittent leave they will take (how many days per month), thanks

## 2020-03-06 NOTE — Telephone Encounter (Signed)
Pt daughter called no answer left a voice mail that paper work was ready and at the front for her to pick up

## 2020-03-11 DIAGNOSIS — F0390 Unspecified dementia without behavioral disturbance: Secondary | ICD-10-CM | POA: Diagnosis not present

## 2020-03-11 DIAGNOSIS — F411 Generalized anxiety disorder: Secondary | ICD-10-CM | POA: Diagnosis not present

## 2020-03-11 DIAGNOSIS — N390 Urinary tract infection, site not specified: Secondary | ICD-10-CM | POA: Diagnosis not present

## 2020-03-11 DIAGNOSIS — M6281 Muscle weakness (generalized): Secondary | ICD-10-CM | POA: Diagnosis not present

## 2020-03-11 DIAGNOSIS — R2681 Unsteadiness on feet: Secondary | ICD-10-CM | POA: Diagnosis not present

## 2020-03-11 DIAGNOSIS — E782 Mixed hyperlipidemia: Secondary | ICD-10-CM | POA: Diagnosis not present

## 2020-03-11 DIAGNOSIS — N179 Acute kidney failure, unspecified: Secondary | ICD-10-CM | POA: Diagnosis not present

## 2020-03-11 DIAGNOSIS — M1990 Unspecified osteoarthritis, unspecified site: Secondary | ICD-10-CM | POA: Diagnosis not present

## 2020-03-11 DIAGNOSIS — I959 Hypotension, unspecified: Secondary | ICD-10-CM | POA: Diagnosis not present

## 2020-03-12 DIAGNOSIS — N823 Fistula of vagina to large intestine: Secondary | ICD-10-CM | POA: Diagnosis not present

## 2020-03-12 DIAGNOSIS — N813 Complete uterovaginal prolapse: Secondary | ICD-10-CM | POA: Diagnosis not present

## 2020-03-12 DIAGNOSIS — Z4689 Encounter for fitting and adjustment of other specified devices: Secondary | ICD-10-CM | POA: Diagnosis not present

## 2020-03-13 DIAGNOSIS — F0390 Unspecified dementia without behavioral disturbance: Secondary | ICD-10-CM | POA: Diagnosis not present

## 2020-03-13 DIAGNOSIS — N179 Acute kidney failure, unspecified: Secondary | ICD-10-CM | POA: Diagnosis not present

## 2020-03-13 DIAGNOSIS — M1990 Unspecified osteoarthritis, unspecified site: Secondary | ICD-10-CM | POA: Diagnosis not present

## 2020-03-13 DIAGNOSIS — M6281 Muscle weakness (generalized): Secondary | ICD-10-CM | POA: Diagnosis not present

## 2020-03-13 DIAGNOSIS — I959 Hypotension, unspecified: Secondary | ICD-10-CM | POA: Diagnosis not present

## 2020-03-13 DIAGNOSIS — R2681 Unsteadiness on feet: Secondary | ICD-10-CM | POA: Diagnosis not present

## 2020-03-13 DIAGNOSIS — F411 Generalized anxiety disorder: Secondary | ICD-10-CM | POA: Diagnosis not present

## 2020-03-13 DIAGNOSIS — E782 Mixed hyperlipidemia: Secondary | ICD-10-CM | POA: Diagnosis not present

## 2020-03-13 DIAGNOSIS — N390 Urinary tract infection, site not specified: Secondary | ICD-10-CM | POA: Diagnosis not present

## 2020-03-14 DIAGNOSIS — I5189 Other ill-defined heart diseases: Secondary | ICD-10-CM | POA: Diagnosis not present

## 2020-03-14 DIAGNOSIS — I959 Hypotension, unspecified: Secondary | ICD-10-CM | POA: Diagnosis not present

## 2020-03-14 DIAGNOSIS — R5381 Other malaise: Secondary | ICD-10-CM | POA: Diagnosis not present

## 2020-03-14 DIAGNOSIS — N390 Urinary tract infection, site not specified: Secondary | ICD-10-CM | POA: Diagnosis not present

## 2020-03-15 DIAGNOSIS — N179 Acute kidney failure, unspecified: Secondary | ICD-10-CM | POA: Diagnosis not present

## 2020-03-15 DIAGNOSIS — F411 Generalized anxiety disorder: Secondary | ICD-10-CM | POA: Diagnosis not present

## 2020-03-15 DIAGNOSIS — R2681 Unsteadiness on feet: Secondary | ICD-10-CM | POA: Diagnosis not present

## 2020-03-15 DIAGNOSIS — E782 Mixed hyperlipidemia: Secondary | ICD-10-CM | POA: Diagnosis not present

## 2020-03-15 DIAGNOSIS — F0390 Unspecified dementia without behavioral disturbance: Secondary | ICD-10-CM | POA: Diagnosis not present

## 2020-03-15 DIAGNOSIS — M6281 Muscle weakness (generalized): Secondary | ICD-10-CM | POA: Diagnosis not present

## 2020-03-15 DIAGNOSIS — M1990 Unspecified osteoarthritis, unspecified site: Secondary | ICD-10-CM | POA: Diagnosis not present

## 2020-03-15 DIAGNOSIS — N390 Urinary tract infection, site not specified: Secondary | ICD-10-CM | POA: Diagnosis not present

## 2020-03-15 DIAGNOSIS — I959 Hypotension, unspecified: Secondary | ICD-10-CM | POA: Diagnosis not present

## 2020-03-18 DIAGNOSIS — M6281 Muscle weakness (generalized): Secondary | ICD-10-CM | POA: Diagnosis not present

## 2020-03-18 DIAGNOSIS — F411 Generalized anxiety disorder: Secondary | ICD-10-CM | POA: Diagnosis not present

## 2020-03-18 DIAGNOSIS — R2681 Unsteadiness on feet: Secondary | ICD-10-CM | POA: Diagnosis not present

## 2020-03-18 DIAGNOSIS — F0390 Unspecified dementia without behavioral disturbance: Secondary | ICD-10-CM | POA: Diagnosis not present

## 2020-03-18 DIAGNOSIS — I959 Hypotension, unspecified: Secondary | ICD-10-CM | POA: Diagnosis not present

## 2020-03-18 DIAGNOSIS — N390 Urinary tract infection, site not specified: Secondary | ICD-10-CM | POA: Diagnosis not present

## 2020-03-18 DIAGNOSIS — N179 Acute kidney failure, unspecified: Secondary | ICD-10-CM | POA: Diagnosis not present

## 2020-03-18 DIAGNOSIS — E782 Mixed hyperlipidemia: Secondary | ICD-10-CM | POA: Diagnosis not present

## 2020-03-18 DIAGNOSIS — M1990 Unspecified osteoarthritis, unspecified site: Secondary | ICD-10-CM | POA: Diagnosis not present

## 2020-03-19 DIAGNOSIS — N390 Urinary tract infection, site not specified: Secondary | ICD-10-CM | POA: Diagnosis not present

## 2020-03-19 DIAGNOSIS — N814 Uterovaginal prolapse, unspecified: Secondary | ICD-10-CM | POA: Diagnosis not present

## 2020-03-19 DIAGNOSIS — N823 Fistula of vagina to large intestine: Secondary | ICD-10-CM | POA: Diagnosis not present

## 2020-03-19 DIAGNOSIS — I9589 Other hypotension: Secondary | ICD-10-CM | POA: Diagnosis not present

## 2020-03-19 DIAGNOSIS — R5381 Other malaise: Secondary | ICD-10-CM | POA: Diagnosis not present

## 2020-03-20 ENCOUNTER — Telehealth: Payer: Self-pay | Admitting: Neurology

## 2020-03-20 DIAGNOSIS — R338 Other retention of urine: Secondary | ICD-10-CM | POA: Diagnosis not present

## 2020-03-20 DIAGNOSIS — N823 Fistula of vagina to large intestine: Secondary | ICD-10-CM | POA: Diagnosis not present

## 2020-03-20 DIAGNOSIS — N813 Complete uterovaginal prolapse: Secondary | ICD-10-CM | POA: Diagnosis not present

## 2020-03-20 DIAGNOSIS — K5900 Constipation, unspecified: Secondary | ICD-10-CM | POA: Diagnosis not present

## 2020-03-20 DIAGNOSIS — Z789 Other specified health status: Secondary | ICD-10-CM | POA: Diagnosis not present

## 2020-03-20 DIAGNOSIS — Z9181 History of falling: Secondary | ICD-10-CM | POA: Diagnosis not present

## 2020-03-20 NOTE — Telephone Encounter (Signed)
Patient cannot stay by herself.   Patient's daughter, Carroll Sage, came by the office and said, "We as a family have come to terms with the fact that my momma cannot stay by herself any longer. She just can't take care of herself by herself anymore. We need Dr. Rosalyn Gess advice on how best to proceed with her care. We may need a nurse to come and help and any other suggestions are welcome. We, my family and me, value the care she gets at this facility and look forward to a call when Dr. Karel Jarvis returns."

## 2020-03-20 NOTE — Telephone Encounter (Signed)
Please see when you return.

## 2020-03-21 DIAGNOSIS — I959 Hypotension, unspecified: Secondary | ICD-10-CM | POA: Diagnosis not present

## 2020-03-21 DIAGNOSIS — M1990 Unspecified osteoarthritis, unspecified site: Secondary | ICD-10-CM | POA: Diagnosis not present

## 2020-03-21 DIAGNOSIS — R2681 Unsteadiness on feet: Secondary | ICD-10-CM | POA: Diagnosis not present

## 2020-03-21 DIAGNOSIS — M6281 Muscle weakness (generalized): Secondary | ICD-10-CM | POA: Diagnosis not present

## 2020-03-21 DIAGNOSIS — F0390 Unspecified dementia without behavioral disturbance: Secondary | ICD-10-CM | POA: Diagnosis not present

## 2020-03-21 DIAGNOSIS — F411 Generalized anxiety disorder: Secondary | ICD-10-CM | POA: Diagnosis not present

## 2020-03-21 DIAGNOSIS — N179 Acute kidney failure, unspecified: Secondary | ICD-10-CM | POA: Diagnosis not present

## 2020-03-21 DIAGNOSIS — E782 Mixed hyperlipidemia: Secondary | ICD-10-CM | POA: Diagnosis not present

## 2020-03-21 DIAGNOSIS — N390 Urinary tract infection, site not specified: Secondary | ICD-10-CM | POA: Diagnosis not present

## 2020-03-25 DIAGNOSIS — M6281 Muscle weakness (generalized): Secondary | ICD-10-CM | POA: Diagnosis not present

## 2020-03-25 DIAGNOSIS — N179 Acute kidney failure, unspecified: Secondary | ICD-10-CM | POA: Diagnosis not present

## 2020-03-25 DIAGNOSIS — I959 Hypotension, unspecified: Secondary | ICD-10-CM | POA: Diagnosis not present

## 2020-03-25 DIAGNOSIS — F0390 Unspecified dementia without behavioral disturbance: Secondary | ICD-10-CM | POA: Diagnosis not present

## 2020-03-25 DIAGNOSIS — R2681 Unsteadiness on feet: Secondary | ICD-10-CM | POA: Diagnosis not present

## 2020-03-25 DIAGNOSIS — N390 Urinary tract infection, site not specified: Secondary | ICD-10-CM | POA: Diagnosis not present

## 2020-03-25 DIAGNOSIS — M1990 Unspecified osteoarthritis, unspecified site: Secondary | ICD-10-CM | POA: Diagnosis not present

## 2020-03-25 DIAGNOSIS — F411 Generalized anxiety disorder: Secondary | ICD-10-CM | POA: Diagnosis not present

## 2020-03-25 DIAGNOSIS — E782 Mixed hyperlipidemia: Secondary | ICD-10-CM | POA: Diagnosis not present

## 2020-03-25 NOTE — Telephone Encounter (Signed)
Pt daughter called nurses phone was sent to voice mail called her back went right pt to daughters voice mail left a message to call the office back,

## 2020-03-25 NOTE — Telephone Encounter (Signed)
Pt daughter was given the information for [facility] [employer]:  [email or web address]

## 2020-03-25 NOTE — Telephone Encounter (Signed)
Left a message for pt daughter to call the office back

## 2020-03-25 NOTE — Telephone Encounter (Signed)
Voicemail- no message left.

## 2020-03-25 NOTE — Telephone Encounter (Signed)
Can you pls get daughter in touch with [facility] [employer]:  [email or web address]  Thanks!

## 2020-03-26 ENCOUNTER — Other Ambulatory Visit: Payer: Self-pay | Admitting: *Deleted

## 2020-03-26 ENCOUNTER — Telehealth: Payer: Self-pay | Admitting: Neurology

## 2020-03-26 NOTE — Telephone Encounter (Signed)
[name], [facility], called to see if Dr. Karel Jarvis is willing to follow the patient for home health orders until she gets a PCP. The patient is transitioning home.

## 2020-03-26 NOTE — Patient Outreach (Signed)
Member screened for potential [facility] Care Management needs as a benefit of [employer].  Per [facility] aka Patient [name] member resides in [facility].   Communication sent to [facility] SW to collaborate about anticipated dc plans and potential [facility] Care Management needs.  Will continue to follow while member resides in SNF.   Raiford Noble, MSN, RN,BSN [facility] Post Acute Care Coordinator [phone] [facility]) [phone]  (Toll free office)

## 2020-03-27 ENCOUNTER — Other Ambulatory Visit: Payer: Self-pay | Admitting: *Deleted

## 2020-03-27 DIAGNOSIS — N183 Chronic kidney disease, stage 3 unspecified: Secondary | ICD-10-CM | POA: Diagnosis not present

## 2020-03-27 DIAGNOSIS — I9589 Other hypotension: Secondary | ICD-10-CM | POA: Diagnosis not present

## 2020-03-27 DIAGNOSIS — D649 Anemia, unspecified: Secondary | ICD-10-CM | POA: Diagnosis not present

## 2020-03-27 DIAGNOSIS — N823 Fistula of vagina to large intestine: Secondary | ICD-10-CM | POA: Diagnosis not present

## 2020-03-27 DIAGNOSIS — I5189 Other ill-defined heart diseases: Secondary | ICD-10-CM | POA: Diagnosis not present

## 2020-03-27 DIAGNOSIS — N390 Urinary tract infection, site not specified: Secondary | ICD-10-CM | POA: Diagnosis not present

## 2020-03-27 DIAGNOSIS — F0390 Unspecified dementia without behavioral disturbance: Secondary | ICD-10-CM | POA: Diagnosis not present

## 2020-03-27 DIAGNOSIS — N814 Uterovaginal prolapse, unspecified: Secondary | ICD-10-CM | POA: Diagnosis not present

## 2020-03-27 DIAGNOSIS — R5381 Other malaise: Secondary | ICD-10-CM | POA: Diagnosis not present

## 2020-03-27 DIAGNOSIS — R627 Adult failure to thrive: Secondary | ICD-10-CM | POA: Diagnosis not present

## 2020-03-27 NOTE — Telephone Encounter (Signed)
Until she gets a PCP, ok. Thanks

## 2020-03-27 NOTE — Telephone Encounter (Signed)
Spoke with [name], at [facility] Dr Karel Jarvis with do her home health orders until she gets a PCP

## 2020-03-27 NOTE — Patient Outreach (Signed)
[facility] Post- Acute Care Coordinator follow up. Member screened for potential [facility] Care Management needs as a benefit of [employer].  Update received from [facility] SW indicating member will transition to home on tomorrow 03/28/20 with [facility]. She will have 24/7 assist. Lives with daughter. SW suggested that member could benefit from [facility] services.   Telephone call made to Glenis Smoker daughter/ DPR [phone]. Patient identifiers confirmed.  Angelita states member will have a new PCP with [facility] in February.  Encouraged daughter to see if member can be seen sooner since she would have had a recent hospitalization and SNF stay.   Explained [facility] Care Management services. Daughter pleasantly declines [facility] follow up. States both she and her sister manage member's care. States the less people involved the better to decrease on confusion. Agreeable to writer emailing [facility] Care Management contact and website information for her reference.   Communication sent to facility SW to make aware family declined [facility] Care Management follow up.   No further needs assessed.    Raiford Noble, MSN, RN,BSN [facility] Post Acute Care Coordinator [phone] [facility]) [phone]  (Toll free office)

## 2020-03-28 DIAGNOSIS — M1990 Unspecified osteoarthritis, unspecified site: Secondary | ICD-10-CM | POA: Diagnosis not present

## 2020-03-28 DIAGNOSIS — M6281 Muscle weakness (generalized): Secondary | ICD-10-CM | POA: Diagnosis not present

## 2020-03-28 DIAGNOSIS — F411 Generalized anxiety disorder: Secondary | ICD-10-CM | POA: Diagnosis not present

## 2020-03-28 DIAGNOSIS — E782 Mixed hyperlipidemia: Secondary | ICD-10-CM | POA: Diagnosis not present

## 2020-03-28 DIAGNOSIS — I959 Hypotension, unspecified: Secondary | ICD-10-CM | POA: Diagnosis not present

## 2020-03-28 DIAGNOSIS — N390 Urinary tract infection, site not specified: Secondary | ICD-10-CM | POA: Diagnosis not present

## 2020-03-28 DIAGNOSIS — R2681 Unsteadiness on feet: Secondary | ICD-10-CM | POA: Diagnosis not present

## 2020-03-28 DIAGNOSIS — N179 Acute kidney failure, unspecified: Secondary | ICD-10-CM | POA: Diagnosis not present

## 2020-03-28 DIAGNOSIS — F0390 Unspecified dementia without behavioral disturbance: Secondary | ICD-10-CM | POA: Diagnosis not present

## 2020-04-04 ENCOUNTER — Encounter: Payer: Self-pay | Admitting: Internal Medicine

## 2020-04-04 ENCOUNTER — Ambulatory Visit: Payer: MEDICARE | Attending: Internal Medicine | Admitting: Internal Medicine

## 2020-04-04 ENCOUNTER — Other Ambulatory Visit: Payer: Self-pay

## 2020-04-04 VITALS — BP 124/80 | HR 71 | Temp 98.4°F | Resp 16 | Wt 150.0 lb

## 2020-04-04 DIAGNOSIS — I700 Atherosclerosis of aorta: Secondary | ICD-10-CM | POA: Diagnosis not present

## 2020-04-04 DIAGNOSIS — N814 Uterovaginal prolapse, unspecified: Secondary | ICD-10-CM | POA: Diagnosis not present

## 2020-04-04 DIAGNOSIS — R296 Repeated falls: Secondary | ICD-10-CM | POA: Diagnosis not present

## 2020-04-04 DIAGNOSIS — Z7982 Long term (current) use of aspirin: Secondary | ICD-10-CM | POA: Insufficient documentation

## 2020-04-04 DIAGNOSIS — Z87891 Personal history of nicotine dependence: Secondary | ICD-10-CM | POA: Insufficient documentation

## 2020-04-04 DIAGNOSIS — Z79899 Other long term (current) drug therapy: Secondary | ICD-10-CM | POA: Diagnosis not present

## 2020-04-04 DIAGNOSIS — Z882 Allergy status to sulfonamides status: Secondary | ICD-10-CM | POA: Diagnosis not present

## 2020-04-04 DIAGNOSIS — R159 Full incontinence of feces: Secondary | ICD-10-CM | POA: Insufficient documentation

## 2020-04-04 DIAGNOSIS — I951 Orthostatic hypotension: Secondary | ICD-10-CM | POA: Insufficient documentation

## 2020-04-04 DIAGNOSIS — Z6827 Body mass index (BMI) 27.0-27.9, adult: Secondary | ICD-10-CM | POA: Diagnosis not present

## 2020-04-04 DIAGNOSIS — D649 Anemia, unspecified: Secondary | ICD-10-CM | POA: Insufficient documentation

## 2020-04-04 DIAGNOSIS — I959 Hypotension, unspecified: Secondary | ICD-10-CM | POA: Diagnosis not present

## 2020-04-04 DIAGNOSIS — N179 Acute kidney failure, unspecified: Secondary | ICD-10-CM | POA: Insufficient documentation

## 2020-04-04 DIAGNOSIS — R627 Adult failure to thrive: Secondary | ICD-10-CM | POA: Diagnosis not present

## 2020-04-04 DIAGNOSIS — R32 Unspecified urinary incontinence: Secondary | ICD-10-CM | POA: Diagnosis not present

## 2020-04-04 DIAGNOSIS — Z8249 Family history of ischemic heart disease and other diseases of the circulatory system: Secondary | ICD-10-CM | POA: Insufficient documentation

## 2020-04-04 DIAGNOSIS — I5189 Other ill-defined heart diseases: Secondary | ICD-10-CM | POA: Diagnosis not present

## 2020-04-04 DIAGNOSIS — N183 Chronic kidney disease, stage 3 unspecified: Secondary | ICD-10-CM | POA: Diagnosis not present

## 2020-04-04 DIAGNOSIS — I10 Essential (primary) hypertension: Secondary | ICD-10-CM | POA: Diagnosis not present

## 2020-04-04 DIAGNOSIS — K219 Gastro-esophageal reflux disease without esophagitis: Secondary | ICD-10-CM | POA: Diagnosis not present

## 2020-04-04 DIAGNOSIS — F32A Depression, unspecified: Secondary | ICD-10-CM | POA: Diagnosis not present

## 2020-04-04 DIAGNOSIS — F0390 Unspecified dementia without behavioral disturbance: Secondary | ICD-10-CM | POA: Diagnosis not present

## 2020-04-04 DIAGNOSIS — R531 Weakness: Secondary | ICD-10-CM | POA: Diagnosis not present

## 2020-04-04 DIAGNOSIS — R42 Dizziness and giddiness: Secondary | ICD-10-CM | POA: Diagnosis not present

## 2020-04-04 DIAGNOSIS — Z87440 Personal history of urinary (tract) infections: Secondary | ICD-10-CM | POA: Diagnosis not present

## 2020-04-04 DIAGNOSIS — M6258 Muscle wasting and atrophy, not elsewhere classified, other site: Secondary | ICD-10-CM | POA: Diagnosis not present

## 2020-04-04 DIAGNOSIS — M150 Primary generalized (osteo)arthritis: Secondary | ICD-10-CM | POA: Diagnosis not present

## 2020-04-04 DIAGNOSIS — Z09 Encounter for follow-up examination after completed treatment for conditions other than malignant neoplasm: Secondary | ICD-10-CM | POA: Diagnosis not present

## 2020-04-04 DIAGNOSIS — M519 Unspecified thoracic, thoracolumbar and lumbosacral intervertebral disc disorder: Secondary | ICD-10-CM | POA: Diagnosis not present

## 2020-04-04 DIAGNOSIS — I1310 Hypertensive heart and chronic kidney disease without heart failure, with stage 1 through stage 4 chronic kidney disease, or unspecified chronic kidney disease: Secondary | ICD-10-CM | POA: Diagnosis not present

## 2020-04-04 DIAGNOSIS — E785 Hyperlipidemia, unspecified: Secondary | ICD-10-CM | POA: Diagnosis not present

## 2020-04-04 DIAGNOSIS — F411 Generalized anxiety disorder: Secondary | ICD-10-CM | POA: Diagnosis not present

## 2020-04-04 DIAGNOSIS — N823 Fistula of vagina to large intestine: Secondary | ICD-10-CM | POA: Diagnosis not present

## 2020-04-04 DIAGNOSIS — Z9181 History of falling: Secondary | ICD-10-CM | POA: Insufficient documentation

## 2020-04-04 MED ORDER — MIDODRINE HCL 10 MG PO TABS
10.0000 mg | ORAL_TABLET | Freq: Two times a day (BID) | ORAL | 1 refills | Status: DC
Start: 2020-04-04 — End: 2020-06-10

## 2020-04-04 NOTE — Progress Notes (Signed)
Patient ID: [name], female    DOB: 1941-02-26  MRN: [id number]  CC:  hosp f/u  Subjective: [name] is a 79 y.o. female who presents for new patient visit and hosp f/u.  Patient's daughter Carroll Sage is with her. Her concerns today include:  Patient with history of dementia, anemia, HTN, bladder prolapse, incontinence of urine and bowel  Patient presents today as a new patient and as hospital follow-up.  She and her daughter tells me that she will be establishing care with Dr. Renato Gails at [facility] in early February.  Patient hospitalized 11/15-21/2021 with weakness, falls and hypotension. Patient was admitted with weakness and falls that started 2 to 3 months prior and was getting worse.  This was associated with decreased appetite, weight loss and low blood pressure.  Patient was found to be hypotensive and had AK I.  Echo revealed EF of 55% with grade 1 diastolic dysfunction.  TSH and cortisol level normal.  Doppler US LEs negative for DVT. She was started on midodrine to help maintain her blood pressure.  Aricept which was started a few months earlier was discontinued and was thought to be contributing to the bradycardia and hypotension.  She was found to have UTI caused by E. coli and was treated with IV Rocephin.  Patient was discharged home in a stable condition.  Today: Patient and daughter reports that she is doing much better.  She ambulates with a walker.  She has not had any further falls.  She endorses a little dizziness with position changes.  She will be getting a lift chair after the Christmas holidays. -Her daughter has been trying to get her to drink more water.  She sometimes drinks Pedialyte. -She has been eating a little bit more but her daughter has to encourage her not to skip meals. -In terms of ADLs, she feeds herself, take baths in the sink and is able to get on and off her elevated toilet seat by herself.  She does have bladder prolapse for which she  sees urologist at [facility].  She does have incontinence of bowel and bladder at times and wears depends.  Anemia: She has a mild to moderate normocytic anemia.  Daughter states she was on iron in the past but was taken off of it.  Not sure why.  Iron studies done back in October consistent with anemia of chronic disease.  She has received her flu vaccine and has completed COVID-19 vaccine series.  Past medical, social, family history and surgical history reviewed.  Patient Active Problem List   Diagnosis Date Noted  . AKI (acute kidney injury) (HCC) 02/26/2020  . Hypotension 02/26/2020  . Weakness   . Midline cystocele 05/03/2019  . OAB (overactive bladder) 05/03/2019  . Urinary retention 05/03/2019  . Uterine procidentia 05/03/2019  . Chest pain, negative stress test. non cardiac. 06/16/2018  . Primary osteoarthritis of left hip 01/20/2017  . Primary osteoarthritis of right hip 11/10/2015  . Anxiety state 11/10/2015  . Preop cardiovascular exam 07/23/2014  . Bilateral edema of lower extremity 07/18/2013    Class: Chronic  . Metabolic syndrome 07/18/2013  . Rectocele 07/07/2012  . Essential hypertension 07/07/2012  . GERD (gastroesophageal reflux disease) 07/07/2012  . Hyperlipidemia with target LDL less than 100 07/07/2012  . Rotator cuff tear, left 03/02/2012  . Osteoarthritis of left shoulder 01/21/2012     Current Outpatient Medications on File Prior to Visit  Medication Sig Dispense Refill  . aspirin  81 MG chewable tablet Chew 81 mg by mouth daily.    . hydrOXYzine (ATARAX/VISTARIL) 25 MG tablet Take 1 tablet (25 mg total) by mouth 3 (three) times daily as needed. (Patient taking differently: Take 25 mg by mouth 3 (three) times daily as needed for anxiety.) 90 tablet 3  . midodrine (PROAMATINE) 10 MG tablet Take 1 tablet (10 mg total) by mouth 2 (two) times daily with a meal.    . Multiple Vitamins-Minerals (MULTI-VITAMIN GUMMIES PO) Take 1 tablet by mouth daily.     .  rosuvastatin (CRESTOR) 40 MG tablet Take 1 tablet (40 mg total) by mouth daily. 90 tablet 1  . sennosides-docusate sodium (SENOKOT-S) 8.6-50 MG tablet Take 2 tablets by mouth daily.    Marland Kitchen trimethoprim (TRIMPEX) 100 MG tablet Take 1 tablet (100 mg total) by mouth daily. 30 tablet 0  . acetaminophen (TYLENOL) 500 MG tablet Take 500 mg by mouth every 6 (six) hours as needed for mild pain. (Patient not taking: Reported on 04/04/2020)    . polyethylene glycol powder (GLYCOLAX/MIRALAX) 17 GM/SCOOP powder Mix 1 capful of powder in drink and take by mouth one to three times daily as needed for daily soft stools  OTC (Patient taking differently: Take 0.5 Containers by mouth daily as needed for mild constipation. ) 225 g 0   No current facility-administered medications on file prior to visit.    Allergies  Allergen Reactions  . Effexor [Venlafaxine Hcl] Swelling and Other (See Comments)    MD ADVISES PATIENT TO NOT TAKE IN FUTURE PT STOPPED, MD DC'd MED  . Sulfa Drugs Cross Reactors Itching    Social History   Socioeconomic History  . Marital status: Widowed    Spouse name: Not on file  . Number of children: 7  . Years of education: Not on file  . Highest education level: Not on file  Occupational History  . Occupation: retired    Associate Professor: RETIRED  Tobacco Use  . Smoking status: Former Smoker    Packs/day: 1.00    Years: 25.00    Pack years: 25.00    Types: Cigarettes    Quit date: 03/07/1984    Years since quitting: 36.1  . Smokeless tobacco: Former Clinical biochemist  . Vaping Use: Never used  Substance and Sexual Activity  . Alcohol use: No  . Drug use: No  . Sexual activity: Not Currently    Birth control/protection: Surgical  Other Topics Concern  . Not on file  Social History Narrative   Marital status:   She is a widowed since 2009; married 45 years      Children:   mother of 77, grandmother of 5.      Lives: alone with home; daughter, son lives with patient       Tobacco: quit smoking 34 years ago      Alcohol: none      ADLs:  Walks with cane; drives.     She does not drink alcohol or smoke cigarettes.   Right handed    Social Determinants of Health   Financial Resource Strain: Not on file  Food Insecurity: Not on file  Transportation Needs: Not on file  Physical Activity: Not on file  Stress: Not on file  Social Connections: Not on file  Intimate Partner Violence: Not on file    Family History  Problem Relation Age of Onset  . Heart disease Mother   . Heart disease Father   . Heart disease Sister   .  Hypertension Sister   . Heart disease Sister   . Hypertension Sister   . Heart disease Sister   . Hypertension Sister     Past Surgical History:  Procedure Laterality Date  . CARDIAC CATHETERIZATION  04/20/1996   patent coronaries, EF 74% (Dr. Aram CandelaW. Gamble)  . CARPAL TUNNEL RELEASE Left   . CATARACT EXTRACTION, BILATERAL    . COLONOSCOPY    . KNEE SURGERY Left 11/2001  . NM MYOVIEW LTD  06/17/2018   EF 60-65 %.  Apical thinning.  LOW RISK no ischemia or infarction.  . REPLACEMENT TOTAL KNEE Left 10/2009  . REPLACEMENT TOTAL KNEE Left 10/2009  . SHOULDER OPEN ROTATOR CUFF REPAIR  03/01/2012   Procedure: ROTATOR CUFF REPAIR SHOULDER OPEN;  Surgeon: Mable ParisJustin William Chandler, MD;  [facility];  Service: Orthopedics;  Laterality: Left;  SUBSCAPULARIS REPAIR  . SVD     x 7  . TOTAL HIP ARTHROPLASTY Right 07/24/2016  . TOTAL HIP ARTHROPLASTY Right 07/24/2016   Procedure: RIGHT TOTAL HIP ARTHROPLASTY ANTERIOR APPROACH;  Surgeon: Jodi GeraldsJohn Graves, MD;  Location: [facility] OR;  Service: Orthopedics;  Laterality: Right;  . TOTAL HIP ARTHROPLASTY Left 04/09/2017   Procedure: LEFT TOTAL HIP ARTHROPLASTY ANTERIOR APPROACH;  Surgeon: Jodi GeraldsGraves, John, MD;  Location: [facility] ORS;  Service: Orthopedics;  Laterality: Left;  . TOTAL SHOULDER ARTHROPLASTY  01/18/2012   Procedure: TOTAL SHOULDER ARTHROPLASTY;  Surgeon: Mable ParisJustin William Chandler, MD;  [facility];  Service:  Orthopedics;  Laterality: Left;  . TOTAL SHOULDER REPLACEMENT Left 01/19/2012  . TRANSTHORACIC ECHOCARDIOGRAM  06/16/2018   EF 60 to 65%.  GR 1 DD.  Normal mitral valve.  Mild aortic sclerosis but no stenosis.  . TUBAL LIGATION    . WISDOM TOOTH EXTRACTION      ROS: Review of Systems Negative except as stated above  PHYSICAL EXAM: BP 133/88   Pulse 71   Temp 98.4 F (36.9 C) (Oral)   Resp 16   Wt 150 lb (68 kg)   SpO2 96%   BMI 28.36 kg/m   Wt Readings from Last 3 Encounters:  04/04/20 150 lb (68 kg)  02/29/20 140 lb (63.5 kg)  02/10/20 140 lb (63.5 kg)    Physical Exam  General appearance - alert, mildly frail appearing pleasant elderly female and in no distress Mental status -patient answers questions appropriately  eyes - pupils equal and reactive, extraocular eye movements intact Mouth -oral mucosa is moist  neck - supple, no significant adenopathy Chest - clear to auscultation, no wheezes, rales or rhonchi, symmetric air entry Heart - normal rate, regular rhythm, normal S1, S2, no murmurs, rubs, clicks or gallops Extremities -no lower extremity edema   CMP Latest Ref Rng & Units 03/02/2020 03/01/2020 02/29/2020  Glucose 70 - 99 mg/dL 161(W100(H) 960(A119(H) 540(J116(H)  BUN 8 - 23 mg/dL 14 15 17   Creatinine 0.44 - 1.00 mg/dL 8.11(B1.26(H) 1.47(W1.57(H) 2.95(A1.49(H)  Sodium 135 - 145 mmol/L 142 142 142  Potassium 3.5 - 5.1 mmol/L 4.1 3.9 3.6  Chloride 98 - 111 mmol/L 112(H) 113(H) 112(H)  CO2 22 - 32 mmol/L 24 24 22   Calcium 8.9 - 10.3 mg/dL 2.1(H8.8(L) 0.8(M8.8(L) 5.7(Q8.8(L)  Total Protein 6.5 - 8.1 g/dL - - -  Total Bilirubin 0.3 - 1.2 mg/dL - - -  Alkaline Phos 38 - 126 U/L - - -  AST 15 - 41 U/L - - -  ALT 0 - 44 U/L - - -   Lipid Panel     Component Value  Date/Time   CHOL 181 02/21/2018 1251   TRIG 49 02/21/2018 1251   HDL 87 02/21/2018 1251   CHOLHDL 2.1 02/21/2018 1251   CHOLHDL 2.1 10/22/2015 1235   VLDL 13 10/22/2015 1235   LDLCALC 84 02/21/2018 1251    CBC    Component Value  Date/Time   WBC 6.4 03/02/2020 0349   RBC 3.11 (L) 03/02/2020 0349   HGB 8.9 (L) 03/02/2020 0349   HGB 11.9 08/11/2017 1517   HCT 28.9 (L) 03/02/2020 0349   HCT 36.7 08/11/2017 1517   PLT 252 03/02/2020 0349   PLT 280 08/11/2017 1517   MCV 92.9 03/02/2020 0349   MCV 93 08/11/2017 1517   MCH 28.6 03/02/2020 0349   MCHC 30.8 03/02/2020 0349   RDW 16.5 (H) 03/02/2020 0349   RDW 14.2 08/11/2017 1517   LYMPHSABS 1.0 02/26/2020 1520   LYMPHSABS 2.4 08/11/2017 1517   MONOABS 0.8 02/26/2020 1520   EOSABS 0.0 02/26/2020 1520   EOSABS 0.2 08/11/2017 1517   BASOSABS 0.0 02/26/2020 1520   BASOSABS 0.0 08/11/2017 1517    ASSESSMENT AND PLAN: 1. Hospital discharge follow-up   2. Orthostatic hypotension She is off antihypertensives.  She continues with midodrine.  Advised to go slow with position changes and to use her walker when she ambulates She will be transition to [facility] in early February as her PCP. - CBC - Comprehensive metabolic panel  3. History of fall See #2 above.  She has not had any further falls since hospital discharge.  Stay off Aricept.  4. Dementia without behavioral disturbance, unspecified dementia type (HCC) Followed by neurology.  Off Aricept as it may have contributed to hypotension and falls  5. Chronic anemia - CBC  6. AKI (acute kidney injury) (HCC) We will recheck kidney function today to see if function is stable or has improved  7. Urinary and fecal incontinence Wears depends    Patient was given the opportunity to ask questions.  Patient verbalized understanding of the plan and was able to repeat key elements of the plan.   No orders of the defined types were placed in this encounter.    Requested Prescriptions    No prescriptions requested or ordered in this encounter    No follow-ups on file.  Jonah Blue, MD, FACP

## 2020-04-04 NOTE — Progress Notes (Signed)
Will have a new PCP in February.   Need medication RF  Left Rehab on Thursday

## 2020-04-04 NOTE — Patient Instructions (Addendum)
Try to go slow with position changes. I have sent a refill on midodrine to your pharmacy. Try to drink several glasses of water daily. Keep your appointment with [facility] in early February.

## 2020-04-05 DIAGNOSIS — R159 Full incontinence of feces: Secondary | ICD-10-CM | POA: Insufficient documentation

## 2020-04-05 DIAGNOSIS — Z9181 History of falling: Secondary | ICD-10-CM | POA: Insufficient documentation

## 2020-04-05 DIAGNOSIS — D649 Anemia, unspecified: Secondary | ICD-10-CM | POA: Insufficient documentation

## 2020-04-05 DIAGNOSIS — R32 Unspecified urinary incontinence: Secondary | ICD-10-CM | POA: Insufficient documentation

## 2020-04-05 LAB — COMPREHENSIVE METABOLIC PANEL
ALT: 8 IU/L (ref 0–32)
AST: 23 IU/L (ref 0–40)
Albumin/Globulin Ratio: 1.5 (ref 1.2–2.2)
Albumin: 4.3 g/dL (ref 3.7–4.7)
Alkaline Phosphatase: 67 IU/L (ref 44–121)
BUN/Creatinine Ratio: 12 (ref 12–28)
BUN: 15 mg/dL (ref 8–27)
Bilirubin Total: 0.3 mg/dL (ref 0.0–1.2)
CO2: 24 mmol/L (ref 20–29)
Calcium: 10.2 mg/dL (ref 8.7–10.3)
Chloride: 106 mmol/L (ref 96–106)
Creatinine, Ser: 1.23 mg/dL — ABNORMAL HIGH (ref 0.57–1.00)
GFR calc Af Amer: 49 mL/min/{1.73_m2} — ABNORMAL LOW (ref >59)
GFR calc non Af Amer: 42 mL/min/{1.73_m2} — ABNORMAL LOW (ref >59)
Globulin, Total: 2.9 g/dL (ref 1.5–4.5)
Glucose: 106 mg/dL — ABNORMAL HIGH (ref 65–99)
Potassium: 4.9 mmol/L (ref 3.5–5.2)
Sodium: 144 mmol/L (ref 134–144)
Total Protein: 7.2 g/dL (ref 6.0–8.5)

## 2020-04-05 LAB — CBC
Hematocrit: 31.4 % — ABNORMAL LOW (ref 34.0–46.6)
Hemoglobin: 10 g/dL — ABNORMAL LOW (ref 11.1–15.9)
MCH: 29.9 pg (ref 26.6–33.0)
MCHC: 31.8 g/dL (ref 31.5–35.7)
MCV: 94 fL (ref 79–97)
Platelets: 284 10*3/uL (ref 150–450)
RBC: 3.34 x10E6/uL — ABNORMAL LOW (ref 3.77–5.28)
RDW: 15.7 % — ABNORMAL HIGH (ref 11.7–15.4)
WBC: 5.8 10*3/uL (ref 3.4–10.8)

## 2020-04-05 NOTE — Progress Notes (Signed)
Let patient and her daughter know that she is still anemic but it has improved compared to 1 month ago.  Kidney function still not 100% but also slightly improved.

## 2020-04-08 ENCOUNTER — Other Ambulatory Visit: Payer: Self-pay | Admitting: Internal Medicine

## 2020-04-08 ENCOUNTER — Telehealth: Payer: Self-pay

## 2020-04-08 NOTE — Telephone Encounter (Signed)
[facility] - verbal refill given.

## 2020-04-08 NOTE — Telephone Encounter (Signed)
Medication: midodrine (PROAMATINE) 10 MG tablet [[id number]] - Patient's daughter is ask if this medication can be resent. It was not received by the pharmacy.   Has the patient contacted their pharmacy? YES  (Agent: If no, request that the patient contact the pharmacy for the refill.) (Agent: If yes, when and what did the pharmacy advise?)  Preferred Pharmacy (with phone number or street name): [phone] [place] [facility] [address] [place] [address] Phone: [phone] Fax: [phone] Hours: Not open 24 hours    Agent: Please be advised that RX refills may take up to 3 business days. We ask that you follow-up with your pharmacy.

## 2020-04-08 NOTE — Telephone Encounter (Signed)
Requested medication (s) are due for refill today: yes  Requested medication (s) are on the active medication list: yes  Last refill:  04/04/20 #60 with 1 refill  Future visit scheduled: no  Notes to clinic:  Please review for refill. Refill not delegated per protocol. Patient's daughter reports that refill sent to pharmacy on 04/04/20 was not received.    Requested Prescriptions  Pending Prescriptions Disp Refills   midodrine (PROAMATINE) 10 MG tablet 60 tablet 1    Sig: Take 1 tablet (10 mg total) by mouth 2 (two) times daily with a meal.      Not Delegated - Cardiovascular: Midodrine Failed - 04/08/2020  9:33 AM      Failed - This refill cannot be delegated      Passed - Last BP in normal range    BP Readings from Last 1 Encounters:  04/04/20 124/80          Passed - Valid encounter within last 12 months    Recent Outpatient Visits           4 days ago Hospital discharge follow-up   [facility] Marcine Matar, MD   7 months ago Hypercalcemia   [facility] at [facility], Manus Rudd, MD   1 year ago Essential hypertension   Primary Care at [facility], Manus Rudd, MD   1 year ago Hospital discharge follow-up   Primary Care at [facility], [place] A, MD   2 years ago Hyperlipidemia with target LDL less than 100   Primary Care at [facility], Manus Rudd, MD

## 2020-04-10 ENCOUNTER — Telehealth: Payer: Self-pay | Admitting: Neurology

## 2020-04-10 DIAGNOSIS — I5189 Other ill-defined heart diseases: Secondary | ICD-10-CM | POA: Diagnosis not present

## 2020-04-10 DIAGNOSIS — I1310 Hypertensive heart and chronic kidney disease without heart failure, with stage 1 through stage 4 chronic kidney disease, or unspecified chronic kidney disease: Secondary | ICD-10-CM | POA: Diagnosis not present

## 2020-04-10 DIAGNOSIS — D649 Anemia, unspecified: Secondary | ICD-10-CM | POA: Diagnosis not present

## 2020-04-10 DIAGNOSIS — I959 Hypotension, unspecified: Secondary | ICD-10-CM | POA: Diagnosis not present

## 2020-04-10 DIAGNOSIS — N183 Chronic kidney disease, stage 3 unspecified: Secondary | ICD-10-CM | POA: Diagnosis not present

## 2020-04-10 DIAGNOSIS — F0390 Unspecified dementia without behavioral disturbance: Secondary | ICD-10-CM | POA: Diagnosis not present

## 2020-04-10 NOTE — Telephone Encounter (Signed)
[name] home health called no answer left voice mail on secure line for verbal PT orders 1 week 1 time 2 week 3 times 1 week 1 time.

## 2020-04-11 DIAGNOSIS — D649 Anemia, unspecified: Secondary | ICD-10-CM | POA: Diagnosis not present

## 2020-04-11 DIAGNOSIS — N183 Chronic kidney disease, stage 3 unspecified: Secondary | ICD-10-CM | POA: Diagnosis not present

## 2020-04-11 DIAGNOSIS — I5189 Other ill-defined heart diseases: Secondary | ICD-10-CM | POA: Diagnosis not present

## 2020-04-11 DIAGNOSIS — F0390 Unspecified dementia without behavioral disturbance: Secondary | ICD-10-CM | POA: Diagnosis not present

## 2020-04-11 DIAGNOSIS — I1310 Hypertensive heart and chronic kidney disease without heart failure, with stage 1 through stage 4 chronic kidney disease, or unspecified chronic kidney disease: Secondary | ICD-10-CM | POA: Diagnosis not present

## 2020-04-11 DIAGNOSIS — I959 Hypotension, unspecified: Secondary | ICD-10-CM | POA: Diagnosis not present

## 2020-04-15 ENCOUNTER — Telehealth: Payer: Self-pay | Admitting: Neurology

## 2020-04-15 MED ORDER — MIRTAZAPINE 15 MG PO TABS: 15.0000 mg | ORAL_TABLET | Freq: Every day | ORAL | 6 refills | Status: DC

## 2020-04-15 NOTE — Telephone Encounter (Signed)
Spoke with pt Daughter informed her that Dr Karel Jarvis would like to start mirtazapine 15mg  every night to help with sleep and hopefully with anxiety. Main side effect can be drowsiness, and we want her to sleep at night pt daughter verbalized understanding, Prescription sent to pharmacy

## 2020-04-15 NOTE — Telephone Encounter (Signed)
Daughter, Angelita, called and said, "We'd like to see if there is something else Dr. Karel Jarvis can prescribe for my mom's dementia and/or another strength of anxiety medicine. She is all over the place and is starting to get up during the night. It's been a tough two weeks and we need some help."  [facility] [place]

## 2020-04-15 NOTE — Telephone Encounter (Signed)
Recommend starting mirtazapine 15mg  every night to help with sleep and hopefully with anxiety. Main side effect can be drowsiness, and we want her to sleep at night. [facility], thanks

## 2020-04-16 ENCOUNTER — Telehealth: Payer: Self-pay | Admitting: Neurology

## 2020-04-16 NOTE — Telephone Encounter (Signed)
Patient's daughter called in stating she would like to see if the patient could be assessed. They are trying to get her into adult daycare. She also stated the patient has not been taking her dementia medication due to them taking her off of it in the hospital. She would like to see if the patient can take something else.

## 2020-04-17 NOTE — Telephone Encounter (Signed)
Spoke with PT daughter informed her that Dr Karel Jarvis can just fill out the [facility] for [facility], she does not need a visit for that. Since she now cannot take care of herself at home, stage would be moderate. PT daughter verbalized understanding stated that she will bring the forms by when they get them,

## 2020-04-17 NOTE — Telephone Encounter (Signed)
Patient daughter is calling  To speak to someone about getting patient in to [facility]. She states that last couple of weeks have been rough and they need to get her in where she is safe. Patient has appt in July and the family is doing a virtual walk through with [facility] this week. Patient needs to be seen by Karel Jarvis to know her stage of Dementia and they will know at [facility].

## 2020-04-17 NOTE — Telephone Encounter (Signed)
Pls let daughter know that I can just fill out the [facility] for [facility], she does not need a visit for that. Since she now cannot take care of herself at home, stage would be moderate. Thanks

## 2020-04-18 ENCOUNTER — Encounter (HOSPITAL_COMMUNITY): Payer: Self-pay | Admitting: Emergency Medicine

## 2020-04-18 ENCOUNTER — Emergency Department (HOSPITAL_COMMUNITY)
Admission: EM | Admit: 2020-04-18 | Discharge: 2020-04-19 | Disposition: A | Discharge status: Home / Self Care | Payer: MEDICARE | Attending: Emergency Medicine | Admitting: Emergency Medicine

## 2020-04-18 ENCOUNTER — Other Ambulatory Visit: Payer: Self-pay

## 2020-04-18 DIAGNOSIS — R55 Syncope and collapse: Secondary | ICD-10-CM | POA: Diagnosis not present

## 2020-04-18 DIAGNOSIS — K5900 Constipation, unspecified: Secondary | ICD-10-CM | POA: Insufficient documentation

## 2020-04-18 DIAGNOSIS — I10 Essential (primary) hypertension: Secondary | ICD-10-CM | POA: Insufficient documentation

## 2020-04-18 DIAGNOSIS — I491 Atrial premature depolarization: Secondary | ICD-10-CM | POA: Diagnosis not present

## 2020-04-18 DIAGNOSIS — Z79899 Other long term (current) drug therapy: Secondary | ICD-10-CM | POA: Insufficient documentation

## 2020-04-18 DIAGNOSIS — Z7982 Long term (current) use of aspirin: Secondary | ICD-10-CM | POA: Diagnosis not present

## 2020-04-18 DIAGNOSIS — Z955 Presence of coronary angioplasty implant and graft: Secondary | ICD-10-CM | POA: Diagnosis not present

## 2020-04-18 DIAGNOSIS — R52 Pain, unspecified: Secondary | ICD-10-CM | POA: Diagnosis not present

## 2020-04-18 DIAGNOSIS — Z96643 Presence of artificial hip joint, bilateral: Secondary | ICD-10-CM | POA: Diagnosis not present

## 2020-04-18 DIAGNOSIS — N3289 Other specified disorders of bladder: Secondary | ICD-10-CM | POA: Diagnosis not present

## 2020-04-18 DIAGNOSIS — Z87891 Personal history of nicotine dependence: Secondary | ICD-10-CM | POA: Diagnosis not present

## 2020-04-18 DIAGNOSIS — R404 Transient alteration of awareness: Secondary | ICD-10-CM | POA: Diagnosis not present

## 2020-04-18 DIAGNOSIS — K429 Umbilical hernia without obstruction or gangrene: Secondary | ICD-10-CM | POA: Diagnosis not present

## 2020-04-18 DIAGNOSIS — R231 Pallor: Secondary | ICD-10-CM | POA: Diagnosis not present

## 2020-04-18 LAB — CBC WITH DIFFERENTIAL/PLATELET
Abs Immature Granulocytes: 0.02 10*3/uL (ref 0.00–0.07)
Basophils Absolute: 0.1 10*3/uL (ref 0.0–0.1)
Basophils Relative: 1 %
Eosinophils Absolute: 0.2 10*3/uL (ref 0.0–0.5)
Eosinophils Relative: 4 %
HCT: 35.6 % — ABNORMAL LOW (ref 36.0–46.0)
Hemoglobin: 11.3 g/dL — ABNORMAL LOW (ref 12.0–15.0)
Immature Granulocytes: 0 %
Lymphocytes Relative: 32 %
Lymphs Abs: 2 10*3/uL (ref 0.7–4.0)
MCH: 31.2 pg (ref 26.0–34.0)
MCHC: 31.7 g/dL (ref 30.0–36.0)
MCV: 98.3 fL (ref 80.0–100.0)
Monocytes Absolute: 0.7 10*3/uL (ref 0.1–1.0)
Monocytes Relative: 11 %
Neutro Abs: 3.3 10*3/uL (ref 1.7–7.7)
Neutrophils Relative %: 52 %
Platelets: 262 10*3/uL (ref 150–400)
RBC: 3.62 MIL/uL — ABNORMAL LOW (ref 3.87–5.11)
RDW: 16 % — ABNORMAL HIGH (ref 11.5–15.5)
WBC: 6.2 10*3/uL (ref 4.0–10.5)
nRBC: 0 % (ref 0.0–0.2)

## 2020-04-18 LAB — BASIC METABOLIC PANEL
Anion gap: 12 (ref 5–15)
BUN: 19 mg/dL (ref 8–23)
CO2: 23 mmol/L (ref 22–32)
Calcium: 10.5 mg/dL — ABNORMAL HIGH (ref 8.9–10.3)
Chloride: 101 mmol/L (ref 98–111)
Creatinine, Ser: 1.37 mg/dL — ABNORMAL HIGH (ref 0.44–1.00)
GFR, Estimated: 40 mL/min — ABNORMAL LOW (ref >60)
Glucose, Bld: 124 mg/dL — ABNORMAL HIGH (ref 70–99)
Potassium: 3.8 mmol/L (ref 3.5–5.1)
Sodium: 136 mmol/L (ref 135–145)

## 2020-04-18 NOTE — Progress Notes (Signed)
Pt has dementia and altered at [facility] time. Daughter/ POA approved to stay w/pt in waiting room

## 2020-04-18 NOTE — ED Triage Notes (Signed)
Patient arrived with EMS from home brief syncopal episode at home this evening with generalized weakness , fatigue and constipation , alert at arrival , history of dementia .

## 2020-04-19 ENCOUNTER — Emergency Department (HOSPITAL_COMMUNITY): Payer: MEDICARE

## 2020-04-19 DIAGNOSIS — K5900 Constipation, unspecified: Secondary | ICD-10-CM | POA: Diagnosis not present

## 2020-04-19 DIAGNOSIS — K429 Umbilical hernia without obstruction or gangrene: Secondary | ICD-10-CM | POA: Diagnosis not present

## 2020-04-19 DIAGNOSIS — R55 Syncope and collapse: Secondary | ICD-10-CM | POA: Diagnosis not present

## 2020-04-19 DIAGNOSIS — N3289 Other specified disorders of bladder: Secondary | ICD-10-CM | POA: Diagnosis not present

## 2020-04-19 MED ORDER — BISACODYL 10 MG RE SUPP
10.0000 mg | Freq: Once | RECTAL | Status: AC
  Administered 2020-04-19: 10 mg via RECTAL
  Filled 2020-04-19: qty 1

## 2020-04-19 MED ORDER — MINERAL OIL RE ENEM
1.0000 | ENEMA | Freq: Once | RECTAL | Status: AC
  Administered 2020-04-19: 1 via RECTAL
  Filled 2020-04-19 (×2): qty 1

## 2020-04-19 MED ORDER — SENNA-DOCUSATE SODIUM 8.6-50 MG PO TABS
2.0000 | ORAL_TABLET | Freq: Every day | ORAL | 2 refills | Status: DC
Start: 2020-04-19 — End: 2020-11-14

## 2020-04-19 MED ORDER — MAGNESIUM CITRATE PO SOLN
1.0000 | Freq: Once | ORAL | Status: AC
  Administered 2020-04-19: 1 via ORAL
  Filled 2020-04-19: qty 296

## 2020-04-19 MED ORDER — LIDOCAINE HCL URETHRAL/MUCOSAL 2 % EX GEL
1.0000 "application " | Freq: Once | CUTANEOUS | Status: AC
  Administered 2020-04-19: 1 via TOPICAL
  Filled 2020-04-19: qty 11

## 2020-04-19 MED ORDER — POLYETHYLENE GLYCOL 3350 17 GM/SCOOP PO POWD: ORAL | 0 refills | Status: AC

## 2020-04-19 NOTE — ED Provider Notes (Signed)
[facility] EMERGENCY DEPARTMENT Provider Note   CSN: [id number] Arrival date & time: 04/18/20  2040     History Chief Complaint  Patient presents with  . Syncope    [name] is a 80 y.o. female with history of dementia and frequent episodes of syncope versus near syncope presenting emergency department with near syncope and constipation.  Her daughter is present at bedside provides supplemental history.  Her brother said the primary reason they brought her mother and was because she has been constipated for a week.  The family has tried magnesium citrate at home as well as an enema, they are not able to produce a bowel movement.  The patient does suffer from constipation chronically.  She has not been vomiting.  Yesterday when planning to bring her to the ER, the patient was sitting on a chair and became very dazed.  Daughter said they noted the patient's eyes seem glazed over and she appeared woozy.  There is no complete loss of consciousness.  This lasted a few minutes and she recovered.  She apparently has had multiple episodes like this in the past.  She is admitted to the hospital for syncopal episodes.  She had an echocardiogram as recently as November 2021, 3 months ago, which showed no severe structural irregularities.  She does have transient hypotension is not on midodrine 10 mg twice daily status, and did receive a dose last night as well as this morning.  Unfortunate the patient had very prolonged wait in the waiting room overnight due to critical capacity issues during the covid pandemic. This wait was approximately 16 hours prior to her receiving a bed and being evaluated by myself.  Last tte 02/27/20:  IMPRESSIONS    1. Left ventricular ejection fraction, by estimation, is 55%. The left  ventricle has normal function. The left ventricle has no regional wall  motion abnormalities. There is mild asymmetric left ventricular  hypertrophy of the  basal-septal segment. Left  ventricular diastolic parameters are consistent with Grade I diastolic  dysfunction (impaired relaxation).  2. Right ventricular systolic function is normal. The right ventricular  size is mildly enlarged. There is normal pulmonary artery systolic  pressure. The estimated right ventricular systolic pressure is 27.4 mmHg.  3. The mitral valve is normal in structure. No evidence of mitral valve  regurgitation. No evidence of mitral stenosis.  4. The aortic valve is normal in structure. Aortic valve regurgitation is  trivial. No aortic stenosis is present.  5. The inferior vena cava is normal in size with greater than 50%  respiratory variability, suggesting right atrial pressure of 3 mmHg  HPI     Past Medical History:  Diagnosis Date  . Anemia    on iron  . Anxiety   . Arthritis    shoulders, knees, hands, hips; status post left knee surgery  . Chronic bronchitis (HCC)    CXR 07/08/16  . Depression   . GERD (gastroesophageal reflux disease)    TAKES TUMS  . H/O: pneumonia 2003  . Hyperlipidemia    on med  . Hypertension   . Morbid obesity with BMI of 45.0-49.9, adult (HCC)   . Shingles    recent 02/2017  . Thoracic aortic atherosclerosis (HCC)    CXR 07/08/16    Patient Active Problem List   Diagnosis Date Noted  . History of fall 04/05/2020  . Chronic anemia 04/05/2020  . Urinary and fecal incontinence 04/05/2020  . AKI (acute kidney injury) (  HCC) 02/26/2020  . Hypotension 02/26/2020  . Weakness   . Midline cystocele 05/03/2019  . OAB (overactive bladder) 05/03/2019  . Urinary retention 05/03/2019  . Uterine procidentia 05/03/2019  . Chest pain, negative stress test. non cardiac. 06/16/2018  . Primary osteoarthritis of left hip 01/20/2017  . Primary osteoarthritis of right hip 11/10/2015  . Anxiety state 11/10/2015  . Preop cardiovascular exam 07/23/2014  . Bilateral edema of lower extremity 07/18/2013    Class: Chronic  .  Metabolic syndrome 07/18/2013  . Rectocele 07/07/2012  . Essential hypertension 07/07/2012  . GERD (gastroesophageal reflux disease) 07/07/2012  . Hyperlipidemia with target LDL less than 100 07/07/2012  . Rotator cuff tear, left 03/02/2012  . Osteoarthritis of left shoulder 01/21/2012    Past Surgical History:  Procedure Laterality Date  . CARDIAC CATHETERIZATION  04/20/1996   patent coronaries, EF 74% (Dr. Aram Candela)  . CARPAL TUNNEL RELEASE Left   . CATARACT EXTRACTION, BILATERAL    . COLONOSCOPY    . KNEE SURGERY Left 11/2001  . NM MYOVIEW LTD  06/17/2018   EF 60-65 %.  Apical thinning.  LOW RISK no ischemia or infarction.  . REPLACEMENT TOTAL KNEE Left 10/2009  . REPLACEMENT TOTAL KNEE Left 10/2009  . SHOULDER OPEN ROTATOR CUFF REPAIR  03/01/2012   Procedure: ROTATOR CUFF REPAIR SHOULDER OPEN;  Surgeon: Mable Paris, MD;  Location: [facility] OR;  Service: Orthopedics;  Laterality: Left;  SUBSCAPULARIS REPAIR  . SVD     x 7  . TOTAL HIP ARTHROPLASTY Right 07/24/2016  . TOTAL HIP ARTHROPLASTY Right 07/24/2016   Procedure: RIGHT TOTAL HIP ARTHROPLASTY ANTERIOR APPROACH;  Surgeon: [name], MD;  Location: [facility] OR;  Service: Orthopedics;  Laterality: Right;  . TOTAL HIP ARTHROPLASTY Left 04/09/2017   Procedure: LEFT TOTAL HIP ARTHROPLASTY ANTERIOR APPROACH;  Surgeon: [name], MD;  Location: [facility] ORS;  Service: Orthopedics;  Laterality: Left;  . TOTAL SHOULDER ARTHROPLASTY  01/18/2012   Procedure: TOTAL SHOULDER ARTHROPLASTY;  Surgeon: Mable Paris, MD;  Location: [facility] OR;  Service: Orthopedics;  Laterality: Left;  . TOTAL SHOULDER REPLACEMENT Left 01/19/2012  . TRANSTHORACIC ECHOCARDIOGRAM  06/16/2018   EF 60 to 65%.  GR 1 DD.  Normal mitral valve.  Mild aortic sclerosis but no stenosis.  . TUBAL LIGATION    . WISDOM TOOTH EXTRACTION       OB History   No obstetric history on file.     Family History  Problem Relation Age of Onset  . Heart disease Mother   .  Heart disease Father   . Heart disease Sister   . Hypertension Sister   . Heart disease Sister   . Hypertension Sister   . Heart disease Sister   . Hypertension Sister     Social History   Tobacco Use  . Smoking status: Former Smoker    Packs/day: 1.00    Years: 25.00    Pack years: 25.00    Types: Cigarettes    Quit date: 03/07/1984    Years since quitting: 36.1  . Smokeless tobacco: Former Clinical biochemist  . Vaping Use: Never used  Substance Use Topics  . Alcohol use: No  . Drug use: No    Home Medications Prior to Admission medications   Medication Sig Start Date End Date Taking? Authorizing Provider  acetaminophen (TYLENOL) 500 MG tablet Take 500 mg by mouth every 6 (six) hours as needed for mild pain. Patient not taking: Reported on 04/04/2020  [provider]  aspirin 81 MG chewable tablet Chew 81 mg by mouth daily.    [provider]  hydrOXYzine (ATARAX/VISTARIL) 25 MG tablet Take 1 tablet (25 mg total) by mouth 3 (three) times daily as needed. Patient taking differently: Take 25 mg by mouth 3 (three) times daily as needed for anxiety. 01/26/20   Cameron Sprang, MD  midodrine (PROAMATINE) 10 MG tablet Take 1 tablet (10 mg total) by mouth 2 (two) times daily with a meal. 04/04/20   Ladell Pier, MD  mirtazapine (REMERON) 15 MG tablet Take 1 tablet (15 mg total) by mouth at bedtime. 04/15/20   Cameron Sprang, MD  Multiple Vitamins-Minerals (MULTI-VITAMIN GUMMIES PO) Take 1 tablet by mouth daily.     [provider]  polyethylene glycol powder (GLYCOLAX/MIRALAX) 17 GM/SCOOP powder Mix 1 capful of powder in drink and take by mouth one to three times daily as needed for daily soft stools OTC 04/19/20   Charlesetta Shanks, MD  rosuvastatin (CRESTOR) 40 MG tablet Take 1 tablet (40 mg total) by mouth daily. 01/26/20   Cameron Sprang, MD  sennosides-docusate sodium (SENOKOT-S) 8.6-50 MG tablet Take 2 tablets by mouth daily. 04/19/20   Charlesetta Shanks, MD  trimethoprim (TRIMPEX) 100 MG tablet Take 1 tablet (100 mg total) by mouth daily. 12/14/19   Leonie Man, MD    Allergies    Effexor [venlafaxine hcl] and Sulfa drugs cross reactors  Review of Systems   Review of Systems  Unable to perform ROS: Dementia (level 5 caveat)    Physical Exam Updated Vital Signs BP 121/75 (BP Location: Left Arm)   Pulse 72   Temp 98.5 F (36.9 C) (Oral)   Resp 16   Ht 5\' 6"  (1.676 m)   Wt 68 kg   SpO2 100%   BMI 24.20 kg/m   Physical Exam Constitutional:      General: She is not in acute distress. HENT:     Head: Normocephalic and atraumatic.  Eyes:     Conjunctiva/sclera: Conjunctivae normal.     Pupils: Pupils are equal, round, and reactive to light.  Cardiovascular:     Rate and Rhythm: Normal rate and regular rhythm.  Pulmonary:     Effort: Pulmonary effort is normal. No respiratory distress.  Abdominal:     General: There is no distension.     Tenderness: There is no abdominal tenderness.  Genitourinary:    Comments: Rectal exam performed with Jodi Marble RN present. Disimpaction as noted below No rectal fissures noted Skin:    General: Skin is warm and dry.  Neurological:     General: No focal deficit present.     Mental Status: She is alert. Mental status is at baseline.     ED Results / Procedures / Treatments   Labs (all labs ordered are listed, but only abnormal results are displayed) Labs Reviewed  CBC WITH DIFFERENTIAL/PLATELET - Abnormal; Notable for the following components:      Result Value   RBC 3.62 (*)    Hemoglobin 11.3 (*)    HCT 35.6 (*)    RDW 16.0 (*)    All other components within normal limits  BASIC METABOLIC PANEL - Abnormal; Notable for the following components:   Glucose, Bld 124 (*)    Creatinine, Ser 1.37 (*)    Calcium 10.5 (*)    GFR, Estimated 40 (*)    All other components within normal limits    EKG EKG  Interpretation  Date/Time:  Thursday April 18 2020  20:41:14 EST Ventricular Rate:  67 PR Interval:  178 QRS Duration: 86 QT Interval:  392 QTC Calculation: 414 R Axis:   -10 Text Interpretation: Normal sinus rhythm Nonspecific T wave abnormality Abnormal ECG When compared with ECG of 02/26/2020, No significant change was found Confirmed by Dione Booze ([id number]) on 04/19/2020 3:00:35 AM   Radiology CT ABDOMEN PELVIS WO CONTRAST  Result Date: 04/19/2020 CLINICAL DATA:  Brief syncopal episode at home today. Generalized weakness, fatigue and constipation. EXAM: CT ABDOMEN AND PELVIS WITHOUT CONTRAST TECHNIQUE: Multidetector CT imaging of the abdomen and pelvis was performed following the standard protocol without IV contrast. COMPARISON:  03/21/2018 FINDINGS: Lower chest: Heart normal in size. Small pericardial effusion. Clear lung bases. Hepatobiliary: No focal liver abnormality is seen. No gallstones, gallbladder wall thickening, or biliary dilatation. Pancreas: Unremarkable. No pancreatic ductal dilatation or surrounding inflammatory changes. Spleen: Normal in size without focal abnormality. Adrenals/Urinary Tract: No adrenal masses. Small mid and lower pole low-density right renal masses, largest 1.7 cm, consistent with cysts. No other renal masses, no stones and no hydronephrosis. Normal ureters. Bladder mildly distended and partly obscured by bilateral hip arthroplasty artifact. No gross bladder abnormality. Stomach/Bowel: Stomach is decompressed, otherwise unremarkable. Small bowel is normal in caliber. No wall thickening. No inflammation. Colon and rectum or moderately distended with stool. No wall thickening. No inflammation. Several scattered colonic diverticula are noted. No evidence of appendicitis. Vascular/Lymphatic: Aortic atherosclerosis. No aneurysm. No enlarged lymph nodes. Reproductive: Uterus and bilateral adnexa are unremarkable. Other: Small fat containing umbilical hernia.  No ascites. Musculoskeletal: Mild chronic appearing compression  deformity of T11. No evidence of an acute fracture. Bilateral total hip arthroplasties appear well seated and well aligned. There are degenerative changes of the visualized spine. No osteoblastic or osteolytic lesions. IMPRESSION: 1. No acute findings. No evidence of diverticulitis or other bowel inflammatory process. No bowel obstruction. 2. Moderate increased colonic and rectal stool. 3. Aortic atherosclerosis. Electronically Signed   By: Amie Portland M.D.   On: 04/19/2020 15:40    Procedures Procedures (including critical care time)  Medications Ordered in ED Medications  lidocaine (XYLOCAINE) 2 % jelly 1 application (1 application Topical Given 04/19/20 1420)  magnesium citrate solution 1 Bottle (1 Bottle Oral Given 04/19/20 1600)  bisacodyl (DULCOLAX) suppository 10 mg (10 mg Rectal Given 04/19/20 1350)  mineral oil enema 1 enema (1 enema Rectal Given 04/19/20 1829)    ED Course  I have reviewed the triage vital signs and the nursing notes.  Pertinent labs & imaging results that were available during my care of the patient were reviewed by me and considered in my medical decision making (see chart for details).  This patient presents with family concern for constipation x 1 week, and near syncope today.  1. Constipation  - rectal disimpaction performed as noted below. Soft stool.   - CT scan without bowel obstruction, colon distended with stool - no evidence of stercolitis per labs, no abd ttp on exam - rectal suppository given - patient signed out to EDP provider with plan for nursing to administer enema and magnesium citrate to produce BM, will reassess  2. Near syncope - multiple episodes noted of similar events in the past - last echo 2 months ago, no clear cardiac explanation for this, no aortic stenosis noted - possible transient hypotension as cause - pt on midodrine BID.  BP has been stable here - with her recent hospitalization and w/u, I  do not think this requires  rehospitalization at this point  3.  Disposition - family (daughter) at bedside says they are "really here for the constipation, not anything else."  She prefers to take her mother home and avoid hospitalization.  She feels she is able to care for her at home, and I think this is reasonable if we can produce a BM.  I ordered, reviewed, and interpreted labs, which included BMP near baseline, CBC largely unremarkable.   I ordered medications as noted above for constipation I ordered imaging studies which included CT abdomen I independently visualized and interpreted imaging which showed constipation, large stool burden, no acute surgical findings Additional history was obtained from daughter at bedside Previous records obtained and reviewed showing recent hospital course, echo   Clinical Course as of 04/19/20 1847  Fri Apr 19, 2020  1332 Rectal exam with some soft stool balls in rectal vault, which were broken up.  I'm not certain this was the cause of her constipation.  We'll obtain a CT abdomen to evaluate for alternative causes then consider suppository and PO laxative [MT]    Clinical Course User Index [MT] Devian Bartolomei, Kermit Balo, MD    Final Clinical Impression(s) / ED Diagnoses Final diagnoses:  Constipation, unspecified constipation type    Rx / DC Orders ED Discharge Orders         Ordered    sennosides-docusate sodium (SENOKOT-S) 8.6-50 MG tablet  Daily        04/19/20 1833    polyethylene glycol powder (GLYCOLAX/MIRALAX) 17 GM/SCOOP powder        04/19/20 1833           Terald Sleeper, MD 04/19/20 [phone]

## 2020-04-19 NOTE — ED Notes (Signed)
Enema was successful in producing BM for pt

## 2020-04-19 NOTE — Discharge Instructions (Addendum)
1. See your doctor for recheck within the next 3 to 5 days. 2. Take Senokot-S 2 tablets daily. 3. Take 1 scoop of MiraLAX daily.

## 2020-04-19 NOTE — ED Notes (Signed)
Pt in CT.

## 2020-04-19 NOTE — ED Notes (Signed)
Called patient 3x to check vitals...did not answer

## 2020-04-22 ENCOUNTER — Telehealth: Payer: Self-pay | Admitting: Neurology

## 2020-04-22 DIAGNOSIS — I959 Hypotension, unspecified: Secondary | ICD-10-CM | POA: Diagnosis not present

## 2020-04-22 DIAGNOSIS — N183 Chronic kidney disease, stage 3 unspecified: Secondary | ICD-10-CM | POA: Diagnosis not present

## 2020-04-22 DIAGNOSIS — F0390 Unspecified dementia without behavioral disturbance: Secondary | ICD-10-CM | POA: Diagnosis not present

## 2020-04-22 DIAGNOSIS — I5189 Other ill-defined heart diseases: Secondary | ICD-10-CM | POA: Diagnosis not present

## 2020-04-22 DIAGNOSIS — D649 Anemia, unspecified: Secondary | ICD-10-CM | POA: Diagnosis not present

## 2020-04-22 DIAGNOSIS — I1310 Hypertensive heart and chronic kidney disease without heart failure, with stage 1 through stage 4 chronic kidney disease, or unspecified chronic kidney disease: Secondary | ICD-10-CM | POA: Diagnosis not present

## 2020-04-22 NOTE — Telephone Encounter (Signed)
Ok for order, thank you

## 2020-04-22 NOTE — Telephone Encounter (Signed)
Patient's Occupational Therapist from [facility] needs verbal orders. 1 time a week for 4 weeks including this week for UB strengthening, ADL, and transfers training.

## 2020-04-23 ENCOUNTER — Telehealth: Payer: Self-pay | Admitting: Neurology

## 2020-04-23 DIAGNOSIS — D649 Anemia, unspecified: Secondary | ICD-10-CM | POA: Diagnosis not present

## 2020-04-23 DIAGNOSIS — I1310 Hypertensive heart and chronic kidney disease without heart failure, with stage 1 through stage 4 chronic kidney disease, or unspecified chronic kidney disease: Secondary | ICD-10-CM | POA: Diagnosis not present

## 2020-04-23 DIAGNOSIS — I5189 Other ill-defined heart diseases: Secondary | ICD-10-CM | POA: Diagnosis not present

## 2020-04-23 DIAGNOSIS — I959 Hypotension, unspecified: Secondary | ICD-10-CM | POA: Diagnosis not present

## 2020-04-23 DIAGNOSIS — N183 Chronic kidney disease, stage 3 unspecified: Secondary | ICD-10-CM | POA: Diagnosis not present

## 2020-04-23 DIAGNOSIS — F0390 Unspecified dementia without behavioral disturbance: Secondary | ICD-10-CM | POA: Diagnosis not present

## 2020-04-23 NOTE — Telephone Encounter (Signed)
Verbal order given to Radovan for 1 time a week for 4 weeks ADL, UB strengthening, and transfers training.  He will fax over the order to be signed.

## 2020-04-23 NOTE — Telephone Encounter (Signed)
Patient's daughter came in to drop off some forms from [employer]. She wanted to see if there is another drug for her dementia that won't interfere with her blood pressure. The previous medication she was on interfered with her blood pressure.

## 2020-04-24 DIAGNOSIS — N183 Chronic kidney disease, stage 3 unspecified: Secondary | ICD-10-CM | POA: Diagnosis not present

## 2020-04-24 DIAGNOSIS — I1310 Hypertensive heart and chronic kidney disease without heart failure, with stage 1 through stage 4 chronic kidney disease, or unspecified chronic kidney disease: Secondary | ICD-10-CM | POA: Diagnosis not present

## 2020-04-24 DIAGNOSIS — I5189 Other ill-defined heart diseases: Secondary | ICD-10-CM | POA: Diagnosis not present

## 2020-04-24 DIAGNOSIS — I959 Hypotension, unspecified: Secondary | ICD-10-CM | POA: Diagnosis not present

## 2020-04-24 DIAGNOSIS — F0390 Unspecified dementia without behavioral disturbance: Secondary | ICD-10-CM | POA: Diagnosis not present

## 2020-04-24 DIAGNOSIS — D649 Anemia, unspecified: Secondary | ICD-10-CM | POA: Diagnosis not present

## 2020-04-26 DIAGNOSIS — I1310 Hypertensive heart and chronic kidney disease without heart failure, with stage 1 through stage 4 chronic kidney disease, or unspecified chronic kidney disease: Secondary | ICD-10-CM | POA: Diagnosis not present

## 2020-04-26 DIAGNOSIS — I5189 Other ill-defined heart diseases: Secondary | ICD-10-CM | POA: Diagnosis not present

## 2020-04-26 DIAGNOSIS — D649 Anemia, unspecified: Secondary | ICD-10-CM | POA: Diagnosis not present

## 2020-04-26 DIAGNOSIS — I959 Hypotension, unspecified: Secondary | ICD-10-CM | POA: Diagnosis not present

## 2020-04-26 DIAGNOSIS — N183 Chronic kidney disease, stage 3 unspecified: Secondary | ICD-10-CM | POA: Diagnosis not present

## 2020-04-26 DIAGNOSIS — F0390 Unspecified dementia without behavioral disturbance: Secondary | ICD-10-CM | POA: Diagnosis not present

## 2020-05-03 ENCOUNTER — Other Ambulatory Visit: Payer: Self-pay | Admitting: Cardiology

## 2020-05-04 DIAGNOSIS — R627 Adult failure to thrive: Secondary | ICD-10-CM | POA: Diagnosis not present

## 2020-05-04 DIAGNOSIS — Z6827 Body mass index (BMI) 27.0-27.9, adult: Secondary | ICD-10-CM | POA: Diagnosis not present

## 2020-05-04 DIAGNOSIS — R296 Repeated falls: Secondary | ICD-10-CM | POA: Diagnosis not present

## 2020-05-04 DIAGNOSIS — M519 Unspecified thoracic, thoracolumbar and lumbosacral intervertebral disc disorder: Secondary | ICD-10-CM | POA: Diagnosis not present

## 2020-05-04 DIAGNOSIS — N183 Chronic kidney disease, stage 3 unspecified: Secondary | ICD-10-CM | POA: Diagnosis not present

## 2020-05-04 DIAGNOSIS — Z87440 Personal history of urinary (tract) infections: Secondary | ICD-10-CM | POA: Diagnosis not present

## 2020-05-04 DIAGNOSIS — F411 Generalized anxiety disorder: Secondary | ICD-10-CM | POA: Diagnosis not present

## 2020-05-04 DIAGNOSIS — E785 Hyperlipidemia, unspecified: Secondary | ICD-10-CM | POA: Diagnosis not present

## 2020-05-04 DIAGNOSIS — I959 Hypotension, unspecified: Secondary | ICD-10-CM | POA: Diagnosis not present

## 2020-05-04 DIAGNOSIS — I5189 Other ill-defined heart diseases: Secondary | ICD-10-CM | POA: Diagnosis not present

## 2020-05-04 DIAGNOSIS — I700 Atherosclerosis of aorta: Secondary | ICD-10-CM | POA: Diagnosis not present

## 2020-05-04 DIAGNOSIS — M150 Primary generalized (osteo)arthritis: Secondary | ICD-10-CM | POA: Diagnosis not present

## 2020-05-04 DIAGNOSIS — D649 Anemia, unspecified: Secondary | ICD-10-CM | POA: Diagnosis not present

## 2020-05-04 DIAGNOSIS — Z87891 Personal history of nicotine dependence: Secondary | ICD-10-CM | POA: Diagnosis not present

## 2020-05-04 DIAGNOSIS — K219 Gastro-esophageal reflux disease without esophagitis: Secondary | ICD-10-CM | POA: Diagnosis not present

## 2020-05-04 DIAGNOSIS — I1310 Hypertensive heart and chronic kidney disease without heart failure, with stage 1 through stage 4 chronic kidney disease, or unspecified chronic kidney disease: Secondary | ICD-10-CM | POA: Diagnosis not present

## 2020-05-04 DIAGNOSIS — R531 Weakness: Secondary | ICD-10-CM | POA: Diagnosis not present

## 2020-05-04 DIAGNOSIS — N823 Fistula of vagina to large intestine: Secondary | ICD-10-CM | POA: Diagnosis not present

## 2020-05-04 DIAGNOSIS — F0390 Unspecified dementia without behavioral disturbance: Secondary | ICD-10-CM | POA: Diagnosis not present

## 2020-05-04 DIAGNOSIS — N814 Uterovaginal prolapse, unspecified: Secondary | ICD-10-CM | POA: Diagnosis not present

## 2020-05-04 DIAGNOSIS — F32A Depression, unspecified: Secondary | ICD-10-CM | POA: Diagnosis not present

## 2020-05-07 ENCOUNTER — Telehealth: Payer: Self-pay

## 2020-05-07 NOTE — Telephone Encounter (Signed)
[name] with Speech therapy called requesting orders for pt to be seen 1 time a week for 2 weeks, verbal order given

## 2020-05-08 DIAGNOSIS — I1310 Hypertensive heart and chronic kidney disease without heart failure, with stage 1 through stage 4 chronic kidney disease, or unspecified chronic kidney disease: Secondary | ICD-10-CM | POA: Diagnosis not present

## 2020-05-08 DIAGNOSIS — F0390 Unspecified dementia without behavioral disturbance: Secondary | ICD-10-CM | POA: Diagnosis not present

## 2020-05-08 DIAGNOSIS — I5189 Other ill-defined heart diseases: Secondary | ICD-10-CM | POA: Diagnosis not present

## 2020-05-08 DIAGNOSIS — I959 Hypotension, unspecified: Secondary | ICD-10-CM | POA: Diagnosis not present

## 2020-05-08 DIAGNOSIS — D649 Anemia, unspecified: Secondary | ICD-10-CM | POA: Diagnosis not present

## 2020-05-08 DIAGNOSIS — N183 Chronic kidney disease, stage 3 unspecified: Secondary | ICD-10-CM | POA: Diagnosis not present

## 2020-05-09 ENCOUNTER — Telehealth: Payer: Self-pay

## 2020-05-09 ENCOUNTER — Telehealth: Payer: Self-pay | Admitting: Neurology

## 2020-05-09 DIAGNOSIS — F0390 Unspecified dementia without behavioral disturbance: Secondary | ICD-10-CM | POA: Diagnosis not present

## 2020-05-09 DIAGNOSIS — D649 Anemia, unspecified: Secondary | ICD-10-CM | POA: Diagnosis not present

## 2020-05-09 DIAGNOSIS — I5189 Other ill-defined heart diseases: Secondary | ICD-10-CM | POA: Diagnosis not present

## 2020-05-09 DIAGNOSIS — I1310 Hypertensive heart and chronic kidney disease without heart failure, with stage 1 through stage 4 chronic kidney disease, or unspecified chronic kidney disease: Secondary | ICD-10-CM | POA: Diagnosis not present

## 2020-05-09 DIAGNOSIS — I959 Hypotension, unspecified: Secondary | ICD-10-CM | POA: Diagnosis not present

## 2020-05-09 DIAGNOSIS — N183 Chronic kidney disease, stage 3 unspecified: Secondary | ICD-10-CM | POA: Diagnosis not present

## 2020-05-09 NOTE — Telephone Encounter (Signed)
Verbal orders given to continue home health PT

## 2020-05-09 NOTE — Telephone Encounter (Signed)
Patient's daughter calling to ask for Dr Karel Jarvis to sign off for daughter's FMLA paperwork, she is patient's caregiver. She can drop off paperwork today. Please call to confirm so she can drop paperwork off.

## 2020-05-09 NOTE — Telephone Encounter (Signed)
Spoke with pt daughter she is unsure of if and when she will need the paper work she just had some questions at this time, questions answered. If [name] daughter needs to have FMLA paperwork filled out she was advised she just has to bring it by the office and drop it off and I will make sure that Dr Karel Jarvis gets it and that I will call her when it is ready for her to pick up. Pt Daughter was thankful.

## 2020-05-14 ENCOUNTER — Other Ambulatory Visit: Payer: Self-pay

## 2020-05-14 ENCOUNTER — Other Ambulatory Visit: Payer: Self-pay | Admitting: Internal Medicine

## 2020-05-14 NOTE — Telephone Encounter (Signed)
Requested medication (s) are due for refill today: no  Requested medication (s) are on the active medication list: yes  Last refill:  05/08/2020  Future visit scheduled: no  Notes to clinic:  this refill cannot be delegated    Requested Prescriptions  Pending Prescriptions Disp Refills   midodrine (PROAMATINE) 10 MG tablet [Pharmacy Med Name: MIDODRINE HCL 10 MG TABLET] 60 tablet 1    Sig: TAKE 1 TABLET BY MOUTH TWICE A DAY WITH A MEAL      Not Delegated - Cardiovascular: Midodrine Failed - 05/14/2020 11:04 AM      Failed - This refill cannot be delegated      Passed - Last BP in normal range    BP Readings from Last 1 Encounters:  04/19/20 125/70          Passed - Valid encounter within last 12 months    Recent Outpatient Visits           1 month ago Hospital discharge follow-up   [facility] Marcine Matar, MD   8 months ago Hypercalcemia   [facility] at [facility], Manus Rudd, MD   1 year ago Essential hypertension   [facility] Care at [facility], Manus Rudd, MD   1 year ago Hospital discharge follow-up   [facility] at [facility] [place] A, MD   2 years ago Hyperlipidemia with target LDL less than 100   Primary Care at [facility], Manus Rudd, MD

## 2020-05-15 DIAGNOSIS — D649 Anemia, unspecified: Secondary | ICD-10-CM | POA: Diagnosis not present

## 2020-05-15 DIAGNOSIS — I5189 Other ill-defined heart diseases: Secondary | ICD-10-CM | POA: Diagnosis not present

## 2020-05-15 DIAGNOSIS — I1310 Hypertensive heart and chronic kidney disease without heart failure, with stage 1 through stage 4 chronic kidney disease, or unspecified chronic kidney disease: Secondary | ICD-10-CM | POA: Diagnosis not present

## 2020-05-15 DIAGNOSIS — N183 Chronic kidney disease, stage 3 unspecified: Secondary | ICD-10-CM | POA: Diagnosis not present

## 2020-05-15 DIAGNOSIS — I959 Hypotension, unspecified: Secondary | ICD-10-CM | POA: Diagnosis not present

## 2020-05-15 DIAGNOSIS — F0390 Unspecified dementia without behavioral disturbance: Secondary | ICD-10-CM | POA: Diagnosis not present

## 2020-05-16 ENCOUNTER — Ambulatory Visit: Payer: MEDICARE | Admitting: Orthopedic Surgery

## 2020-05-16 ENCOUNTER — Ambulatory Visit: Payer: MEDICARE | Admitting: Internal Medicine

## 2020-05-18 ENCOUNTER — Other Ambulatory Visit: Payer: Self-pay | Admitting: Cardiology

## 2020-05-18 DIAGNOSIS — F0390 Unspecified dementia without behavioral disturbance: Secondary | ICD-10-CM | POA: Diagnosis not present

## 2020-05-18 DIAGNOSIS — N183 Chronic kidney disease, stage 3 unspecified: Secondary | ICD-10-CM | POA: Diagnosis not present

## 2020-05-18 DIAGNOSIS — I1310 Hypertensive heart and chronic kidney disease without heart failure, with stage 1 through stage 4 chronic kidney disease, or unspecified chronic kidney disease: Secondary | ICD-10-CM | POA: Diagnosis not present

## 2020-05-18 DIAGNOSIS — I5189 Other ill-defined heart diseases: Secondary | ICD-10-CM | POA: Diagnosis not present

## 2020-05-18 DIAGNOSIS — D649 Anemia, unspecified: Secondary | ICD-10-CM | POA: Diagnosis not present

## 2020-05-18 DIAGNOSIS — I959 Hypotension, unspecified: Secondary | ICD-10-CM | POA: Diagnosis not present

## 2020-05-20 ENCOUNTER — Other Ambulatory Visit: Payer: Self-pay

## 2020-05-20 ENCOUNTER — Encounter: Payer: Self-pay | Admitting: Internal Medicine

## 2020-05-20 ENCOUNTER — Ambulatory Visit (INDEPENDENT_AMBULATORY_CARE_PROVIDER_SITE_OTHER): Payer: MEDICARE | Admitting: Internal Medicine

## 2020-05-20 VITALS — BP 122/78 | HR 73 | Temp 97.7°F | Ht 66.0 in | Wt 148.0 lb

## 2020-05-20 DIAGNOSIS — K219 Gastro-esophageal reflux disease without esophagitis: Secondary | ICD-10-CM | POA: Diagnosis not present

## 2020-05-20 DIAGNOSIS — I872 Venous insufficiency (chronic) (peripheral): Secondary | ICD-10-CM | POA: Insufficient documentation

## 2020-05-20 DIAGNOSIS — F324 Major depressive disorder, single episode, in partial remission: Secondary | ICD-10-CM | POA: Diagnosis not present

## 2020-05-20 DIAGNOSIS — R739 Hyperglycemia, unspecified: Secondary | ICD-10-CM | POA: Diagnosis not present

## 2020-05-20 DIAGNOSIS — G301 Alzheimer's disease with late onset: Secondary | ICD-10-CM | POA: Diagnosis not present

## 2020-05-20 DIAGNOSIS — D649 Anemia, unspecified: Secondary | ICD-10-CM

## 2020-05-20 DIAGNOSIS — I10 Essential (primary) hypertension: Secondary | ICD-10-CM | POA: Diagnosis not present

## 2020-05-20 DIAGNOSIS — R627 Adult failure to thrive: Secondary | ICD-10-CM | POA: Diagnosis not present

## 2020-05-20 DIAGNOSIS — F0281 Dementia in other diseases classified elsewhere with behavioral disturbance: Secondary | ICD-10-CM

## 2020-05-20 DIAGNOSIS — N8111 Cystocele, midline: Secondary | ICD-10-CM

## 2020-05-20 DIAGNOSIS — R32 Unspecified urinary incontinence: Secondary | ICD-10-CM

## 2020-05-20 DIAGNOSIS — N816 Rectocele: Secondary | ICD-10-CM

## 2020-05-20 DIAGNOSIS — F02818 Dementia in other diseases classified elsewhere, unspecified severity, with other behavioral disturbance: Secondary | ICD-10-CM | POA: Insufficient documentation

## 2020-05-20 DIAGNOSIS — R159 Full incontinence of feces: Secondary | ICD-10-CM

## 2020-05-20 MED ORDER — TRIMETHOPRIM 100 MG PO TABS: 100.0000 mg | ORAL_TABLET | Freq: Every day | ORAL | 1 refills | Status: DC

## 2020-05-20 NOTE — Progress Notes (Signed)
Provider:  Gwenith Spitz. Renato Gails, D.O., C.M.D. Location:   [facility]   Place of Service:   clinic  Previous PCP: Kermit Balo, DO Patient Care Team: Kermit Balo, DO as PCP - General (Geriatric Medicine) [name], MD as PCP - Cardiology (Cardiology) [name], MD as Consulting Physician (Orthopedic Surgery) [name], MD as Consulting Physician (Urology) [name], MD as Consulting Physician (Neurology)  Extended Emergency Contact Information Primary Emergency Contact: [facility] Address: [address]          [place], [place] [address] D[place] of [place] Home Phone: [phone] Mobile Phone: [phone] Relation: Daughter Secondary Emergency Contact: Shella Maxim [place] of [place] Home Phone: [phone] Mobile Phone: [phone] Relation: Son  Goals of Care: Advanced Directive information Advanced Directives 04/19/2020  Does Patient Have a Medical Advance Directive? No  Type of Advance Directive -  Does patient want to make changes to medical advance directive? -  Copy of Healthcare Power of Attorney in Chart? -  Would patient like information on creating a medical advance directive? No - Patient declined  Pre-existing out of facility DNR order (yellow form or pink MOST form) -  has HCPOA but no living will or code status noted  Chief Complaint  Patient presents with  . Establish Care    New patient to establish care     HPI: Patient is a 80 y.o. female seen today to establish with [facility].  It appears she was previously seen at [facility].  She has a PMH significant for dementia, anemia, htn, bladder prolapse with incontinence of bladder and bowel.    Per the last note in dec, she was hospitalized with weakness, fall and hypotension in November of last year.  She had decreased appetite, weight loss and hypotension and was found to be in acute kidney injury.  She had grade 1 diastolic dysfunction.   She was started on midodrine for hypotension and aricept was stopped due to possibly causing the bradycardia.  She was treated for a UTI with abx and rehydrated.  Afterward, her daughter was giving her more water and pedialyte.  They got her a lift chair.  Intake is still somewhat poor.  She is followed by Dr. Karel Jarvis, neurology for her dementia.    She'd been in [facility] also for 26 days.    She was at [facility].    They needed a primary as Dr. Creta Levin went on her own, but she was not in the network anymore.  Megan Salon referred them here.    Appetite is good now.  Remeron is working well.  Sleeping well also.  Had been getting up roaming around.  Her daughter is very protective and doesn't want her to fall or hurt herself.  She now gets up early and takes her to [facility].  Daughter is a Runner, broadcasting/film/video.    She does have sundowning--they use the hydroxyzine for it--when she gets busy.    Legs hurt sometimes.  They get numb and tingly.    When she left [place]--she has had PT, OT, ST.  OT is done.  ST has a week to make up b/c of weather.  PT has two more weeks b/c Dr. Karel Jarvis extended it.  Her daughter walks with her in the house--6 laps.  She's unable to get the screen door open.    She does have hallucinations, delusions.  She's had her dementia diagnosis for  a year.  She's had a big change since she's been unable to tolerate the aricept.  She has moderate dementia now.    She is on prophylaxis with trimethoprim to prevent UTIs.  Sees Dr. Ashley Royalty with [facility] for her prolapsed bladder.  She had a pessary first which did ok, but the screw grown into her uterine wall.  Fortunately, she had it to come out in her bms.  She's been able to have stools and urinate w/o the pessary.  Pt was saying she was "sitting on a bubble".    She does not drive for over a year.  She's never had a bone density.    Pt had a rough childhood and she'd been going to counseling  at [facility] prior to covid.  They'd do it virtually.  Past Medical History:  Diagnosis Date  . Anemia    on iron  . Anxiety   . Arthritis    shoulders, knees, hands, hips; status post left knee surgery  . Chronic bronchitis (HCC)    CXR 07/08/16  . Depression   . GERD (gastroesophageal reflux disease)    TAKES TUMS  . H/O: pneumonia 2003  . Hyperlipidemia    on med  . Hypertension   . Morbid obesity with BMI of 45.0-49.9, adult (HCC)   . Shingles    recent 02/2017  . Thoracic aortic atherosclerosis (HCC)    CXR 07/08/16   Past Surgical History:  Procedure Laterality Date  . CARDIAC CATHETERIZATION  04/20/1996   patent coronaries, EF 74% (Dr. Aram Candela)  . CARPAL TUNNEL RELEASE Left   . CATARACT EXTRACTION, BILATERAL    . COLONOSCOPY    . KNEE SURGERY Left 11/2001  . NM MYOVIEW LTD  06/17/2018   EF 60-65 %.  Apical thinning.  LOW RISK no ischemia or infarction.  . REPLACEMENT TOTAL KNEE Left 10/2009  . REPLACEMENT TOTAL KNEE Left 10/2009  . SHOULDER OPEN ROTATOR CUFF REPAIR  03/01/2012   Procedure: ROTATOR CUFF REPAIR SHOULDER OPEN;  Surgeon: Mable Paris, MD;  Location: [facility] OR;  Service: Orthopedics;  Laterality: Left;  SUBSCAPULARIS REPAIR  . SVD     x 7  . TOTAL HIP ARTHROPLASTY Right 07/24/2016  . TOTAL HIP ARTHROPLASTY Right 07/24/2016   Procedure: RIGHT TOTAL HIP ARTHROPLASTY ANTERIOR APPROACH;  Surgeon: [name], MD;  Location: [facility] OR;  Service: Orthopedics;  Laterality: Right;  . TOTAL HIP ARTHROPLASTY Left 04/09/2017   Procedure: LEFT TOTAL HIP ARTHROPLASTY ANTERIOR APPROACH;  Surgeon: [name], MD;  Location: [facility]S;  Service: Orthopedics;  Laterality: Left;  . TOTAL SHOULDER ARTHROPLASTY  01/18/2012   Procedure: TOTAL SHOULDER ARTHROPLASTY;  Surgeon: Mable Paris, MD;  Location: [facility] OR;  Service: Orthopedics;  Laterality: Left;  . TOTAL SHOULDER REPLACEMENT Left 01/19/2012  . TRANSTHORACIC ECHOCARDIOGRAM  06/16/2018   EF 60  to 65%.  GR 1 DD.  Normal mitral valve.  Mild aortic sclerosis but no stenosis.  . TUBAL LIGATION    . WISDOM TOOTH EXTRACTION      Social History   Socioeconomic History  . Marital status: Widowed    Spouse name: Not on file  . Number of children: 7  . Years of education: Not on file  . Highest education level: Not on file  Occupational History  . Occupation: retired    Associate Professor: RETIRED  Tobacco Use  . Smoking status: Former Smoker    Packs/day: 1.00    Years: 25.00  Pack years: 25.00    Types: Cigarettes    Quit date: 03/07/1984    Years since quitting: 36.2  . Smokeless tobacco: Former Clinical biochemist  . Vaping Use: Never used  Substance and Sexual Activity  . Alcohol use: No  . Drug use: No  . Sexual activity: Not Currently    Birth control/protection: Surgical  Other Topics Concern  . Not on file  Social History Narrative   Marital status:   She is a widowed since 2009; married 45 years      Children:   mother of 5, grandmother of 5.      Lives: alone with home; daughter, son lives with patient      Tobacco: quit smoking 34 years ago      Alcohol: none      ADLs:  Walks with cane; drives.     She does not drink alcohol or smoke cigarettes.   Right handed    Social Determinants of Health   Financial Resource Strain: Not on file  Food Insecurity: Not on file  Transportation Needs: Not on file  Physical Activity: Not on file  Stress: Not on file  Social Connections: Not on file    reports that she quit smoking about 36 years ago. Her smoking use included cigarettes. She has a 25.00 pack-year smoking history. She has quit using smokeless tobacco. She reports that she does not drink alcohol and does not use drugs.  Functional Status Survey:    Family History  Problem Relation Age of Onset  . Heart disease Mother   . Heart disease Father   . Heart disease Sister   . Hypertension Sister   . Heart disease Sister   . Hypertension Sister   . Heart  disease Sister   . Hypertension Sister     Health Maintenance  Topic Date Due  . DEXA SCAN  Never done  . INFLUENZA VACCINE  11/12/2019  . COVID-19 Vaccine (3 - Booster for Pfizer series) 01/05/2020  . TETANUS/TDAP  08/27/2025  . Hepatitis C Screening  Completed  . PNA vac Low Risk Adult  Completed    Allergies  Allergen Reactions  . Effexor [Venlafaxine Hcl] Swelling and Other (See Comments)    MD ADVISES PATIENT TO NOT TAKE IN FUTURE PT STOPPED, MD DC'd MED  . Sulfa Drugs Cross Reactors Itching    Outpatient Encounter Medications as of 05/20/2020  Medication Sig  . aspirin 81 MG chewable tablet Chew 81 mg by mouth daily.  . hydrOXYzine (ATARAX/VISTARIL) 25 MG tablet Take 1 tablet (25 mg total) by mouth 3 (three) times daily as needed. (Patient taking differently: Take 25 mg by mouth 3 (three) times daily as needed for anxiety.)  . midodrine (PROAMATINE) 10 MG tablet Take 1 tablet (10 mg total) by mouth 2 (two) times daily with a meal.  . mirtazapine (REMERON) 15 MG tablet Take 1 tablet (15 mg total) by mouth at bedtime.  . Multiple Vitamins-Minerals (MULTI-VITAMIN GUMMIES PO) Take 1 tablet by mouth daily.   . polyethylene glycol powder (GLYCOLAX/MIRALAX) 17 GM/SCOOP powder Mix 1 capful of powder in drink and take by mouth one to three times daily as needed for daily soft stools OTC  . rosuvastatin (CRESTOR) 40 MG tablet Take 1 tablet (40 mg total) by mouth daily.  . sennosides-docusate sodium (SENOKOT-S) 8.6-50 MG tablet Take 2 tablets by mouth daily.  Marland Kitchen trimethoprim (TRIMPEX) 100 MG tablet TAKE 1 TABLET BY MOUTH EVERY DAY  . [DISCONTINUED]  acetaminophen (TYLENOL) 500 MG tablet Take 500 mg by mouth every 6 (six) hours as needed for mild pain. (Patient not taking: Reported on 04/04/2020)   No facility-administered encounter medications on file as of 05/20/2020.    Review of Systems  Constitutional: Negative for chills and fever.  HENT: Negative for congestion, hearing loss and  sore throat.   Eyes: Negative for blurred vision.       Prior cataract surgery about 2 yrs ago   Respiratory: Negative for cough and shortness of breath.   Cardiovascular: Positive for leg swelling. Negative for chest pain, palpitations, orthopnea and PND.  Gastrointestinal: Negative for abdominal pain, blood in stool, constipation, diarrhea and melena.       Fecal incontinence  Genitourinary: Negative for dysuria.       Urinary incontinence; cystocele and rectocele  Musculoskeletal: Negative for falls and joint pain.  Skin: Negative for itching and rash.  Neurological: Positive for weakness. Negative for dizziness and loss of consciousness.       Uses rolling walker with tennis balls  Psychiatric/Behavioral: Positive for depression and memory loss. The patient has insomnia. The patient is not nervous/anxious.        Sleep, appetite improved on remeron    Vitals:   05/20/20 1516  Weight: 148 lb (67.1 kg)  Height: 5\' 6"  (1.676 m)   Body mass index is 23.89 kg/m. Physical Exam Vitals reviewed.  Constitutional:      General: She is not in acute distress.    Appearance: Normal appearance. She is not toxic-appearing.  HENT:     Head: Normocephalic and atraumatic.     Right Ear: Tympanic membrane, ear canal and external ear normal.     Left Ear: Tympanic membrane, ear canal and external ear normal.     Nose: Nose normal.     Mouth/Throat:     Pharynx: Oropharynx is clear.  Eyes:     Extraocular Movements: Extraocular movements intact.     Conjunctiva/sclera: Conjunctivae normal.     Pupils: Pupils are equal, round, and reactive to light.  Cardiovascular:     Rate and Rhythm: Normal rate and regular rhythm.     Pulses: Normal pulses.     Heart sounds: Normal heart sounds.  Pulmonary:     Effort: Pulmonary effort is normal.     Breath sounds: Normal breath sounds. No wheezing, rhonchi or rales.  Abdominal:     General: Bowel sounds are normal. There is no distension.      Tenderness: There is no abdominal tenderness. There is no guarding or rebound.  Musculoskeletal:        General: Normal range of motion.     Cervical back: Neck supple.     Right lower leg: Edema present.     Left lower leg: Edema present.     Comments: Nonpitting edema of bilateral lower legs; uses rolling walker with tennis balls  Lymphadenopathy:     Cervical: No cervical adenopathy.  Skin:    General: Skin is warm and dry.  Neurological:     General: No focal deficit present.     Mental Status: She is alert.     Cranial Nerves: No cranial nerve deficit.     Motor: No weakness.     Gait: Gait abnormal.  Psychiatric:        Mood and Affect: Mood normal.     Comments: Was pleasant but tried to get up to leave three times and her daughter, , had to  tell her to sit down and distract her with her scarf to play with     Labs reviewed: Basic Metabolic Panel: Recent Labs    02/28/20 0159 02/29/20 0103 03/02/20 0349 04/04/20 1553 04/18/20 2057  NA 141   < > 142 144 136  K 3.5   < > 4.1 4.9 3.8  CL 114*   < > 112* 106 101  CO2 18*   < > 24 24 23   GLUCOSE 88   < > 100* 106* 124*  BUN 20   < > 14 15 19   CREATININE 1.48*   < > 1.26* 1.23* 1.37*  CALCIUM 8.9   < > 8.8* 10.2 10.5*  MG 2.1  --   --   --   --   PHOS 2.1*  --   --   --   --    < > = values in this interval not displayed.   Liver Function Tests: Recent Labs    01/30/20 1531 02/26/20 1520 04/04/20 1553  AST 24 76* 23  ALT 10 42 8  ALKPHOS 60 63 67  BILITOT 0.5 0.8 0.3  PROT 7.7 7.1 7.2  ALBUMIN 4.3 3.7 4.3   No results for input(s): LIPASE, AMYLASE in the last 8760 hours. No results for input(s): AMMONIA in the last 8760 hours. CBC: Recent Labs    02/26/20 1520 02/28/20 0159 03/02/20 0349 04/04/20 1553 04/18/20 2057  WBC 10.1   < > 6.4 5.8 6.2  NEUTROABS 8.2*  --   --   --  3.3  HGB 10.4*   < > 8.9* 10.0* 11.3*  HCT 33.9*   < > 28.9* 31.4* 35.6*  MCV 93.4   < > 92.9 94 98.3  PLT 249   < >  252 284 262   < > = values in this interval not displayed.   Cardiac Enzymes: Recent Labs    02/29/20 0103 03/01/20 0103 03/02/20 0349  CKTOTAL 1,647* 1,133* 640*   BNP: Invalid input(s): POCBNP Lab Results  Component Value Date   HGBA1C 5.8 05/01/2012   Lab Results  Component Value Date   TSH 0.644 02/26/2020   Lab Results  Component Value Date   VITAMINB12 413 07/04/2019   Lab Results  Component Value Date   FOLATE >20.0 07/07/2012   Lab Results  Component Value Date   IRON 56 01/30/2020   TIBC 286 01/30/2020   FERRITIN 290 (H) 01/30/2020   MMSE - Mini Mental State Exam 02/13/2020  Orientation to time 3  Orientation to Place 4  Registration 3  Attention/ Calculation 3  Recall 2  Language- name 2 objects 2  Language- repeat 1  Language- follow 3 step command 3  Language- read & follow direction 1  Write a sentence 1  Copy design 0  Total score 23    Assessment/Plan 1. Late onset Alzheimer's dementia with behavioral disturbance (HCC) -moderate stage -formally diagnosed about a year ago -follows with Dr. 02/01/2020 neurology -had bradycardia and hypotension with aricept plus weight loss so d/c'd during hospitalization end of last year -going to [facility] and doing well there -her daughter seems to really have a good understanding of things -recommended adding pt to Alzheimer's safe return program   2. Rectocele -bowels doing ok these days with bowel regimen  3. Essential hypertension -bp controlled with current regimen, no changes needed, monitor  4. Gastroesophageal reflux disease without esophagitis -not on tx at present  5. Venous insufficiency -cont to elevate  feet at rest in lift chair  6. Failure to thrive in adult -stabilized after UTI tx, coming off aricept that led to bradycardia, daughter encouraging hydration, cont remeron  7. Bowel and bladder incontinence -[facility]  8. Midline  cystocele -is able to void though incontinent   9. Depression, major, single episode, in partial remission (HCC) -improved sleep and appetite with remeron, cont it -[name] plans to take her back to counselor  Labs/tests ordered:   Lab Orders     CBC with Differential/Platelet     Hemoglobin A1c     COMPLETE METABOLIC PANEL WITH GFR Same day as 4 month f/u  Brittanny Levenhagen L. Nieves Barberi, D.O. Geriatrics [facility] [name] [place] [place], [place] [address] Cell Phone (Mon-Fri 8am-5pm):  [phone] On Call:  [phone] & follow prompts after 5pm & weekends Office Phone:  [phone] Office Fax:  [phone]

## 2020-05-20 NOTE — Patient Instructions (Addendum)
Alzheimer's Safe Return list--through alzheimer's website.  Shingrix series--2 shots 2-6 months apart--can get at [facility] in [place].

## 2020-05-25 ENCOUNTER — Encounter (HOSPITAL_COMMUNITY): Payer: Self-pay | Admitting: Emergency Medicine

## 2020-05-25 ENCOUNTER — Ambulatory Visit (HOSPITAL_COMMUNITY)
Admission: EM | Admit: 2020-05-25 | Discharge: 2020-05-25 | Disposition: A | Discharge status: Home / Self Care | Payer: MEDICARE | Attending: Medical Oncology | Admitting: Medical Oncology

## 2020-05-25 ENCOUNTER — Other Ambulatory Visit: Payer: Self-pay

## 2020-05-25 DIAGNOSIS — K649 Unspecified hemorrhoids: Secondary | ICD-10-CM | POA: Diagnosis not present

## 2020-05-25 MED ORDER — HYDROCORTISONE (PERIANAL) 2.5 % EX CREA: 1.0000 "application " | TOPICAL_CREAM | Freq: Two times a day (BID) | CUTANEOUS | 0 refills | Status: DC

## 2020-05-25 NOTE — ED Provider Notes (Signed)
[facility]    CSN: [id number] Arrival date & time: 05/25/20  1442      History   Chief Complaint Chief Complaint  Patient presents with  . Hemorrhoids    HPI [name] is a 80 y.o. female.   HPI  Hemorrhoids: PT presents with her daughter and she states that pt a large bowel movement this morning and noticed her hemorrhoid was sticking out. Pt doesn't have any bleeding and no pain. No bloody bowel movements. They have not applied anything to the area other than her normal cream. Of note she does have a uterine prolapse which is followed by GYN and is stable per her daughter who is also her caregiver.   Past Medical History:  Diagnosis Date  . Anemia    on iron  . Anxiety   . Arthritis    shoulders, knees, hands, hips; status post left knee surgery  . Chronic bronchitis (HCC)    CXR 07/08/16  . Depression   . GERD (gastroesophageal reflux disease)    TAKES TUMS  . H/O: pneumonia 2003  . Hyperlipidemia    on med  . Hypertension   . Morbid obesity with BMI of 45.0-49.9, adult (HCC)   . Shingles    recent 02/2017  . Thoracic aortic atherosclerosis (HCC)    CXR 07/08/16    Patient Active Problem List   Diagnosis Date Noted  . Failure to thrive in adult 05/20/2020  . Venous insufficiency 05/20/2020  . Depression, major, single episode, in partial remission (HCC) 05/20/2020  . Late onset Alzheimer's dementia with behavioral disturbance (HCC) 05/20/2020  . History of fall 04/05/2020  . Chronic anemia 04/05/2020  . Bowel and bladder incontinence 04/05/2020  . AKI (acute kidney injury) (HCC) 02/26/2020  . Hypotension 02/26/2020  . Weakness   . Midline cystocele 05/03/2019  . OAB (overactive bladder) 05/03/2019  . Urinary retention 05/03/2019  . Uterine procidentia 05/03/2019  . Chest pain, negative stress test. non cardiac. 06/16/2018  . Primary osteoarthritis of left hip 01/20/2017  . Primary osteoarthritis of right hip 11/10/2015  . Anxiety  state 11/10/2015  . Preop cardiovascular exam 07/23/2014  . Bilateral edema of lower extremity 07/18/2013    Class: Chronic  . Metabolic syndrome 07/18/2013  . Rectocele 07/07/2012  . Essential hypertension 07/07/2012  . GERD (gastroesophageal reflux disease) 07/07/2012  . Hyperlipidemia with target LDL less than 100 07/07/2012  . Rotator cuff tear, left 03/02/2012  . Osteoarthritis of left shoulder 01/21/2012    Past Surgical History:  Procedure Laterality Date  . CARDIAC CATHETERIZATION  04/20/1996   patent coronaries, EF 74% (Dr. Aram Candela)  . CARPAL TUNNEL RELEASE Left   . CATARACT EXTRACTION, BILATERAL    . COLONOSCOPY    . KNEE SURGERY Left 11/2001  . NM MYOVIEW LTD  06/17/2018   EF 60-65 %.  Apical thinning.  LOW RISK no ischemia or infarction.  . REPLACEMENT TOTAL KNEE Left 10/2009  . REPLACEMENT TOTAL KNEE Left 10/2009  . SHOULDER OPEN ROTATOR CUFF REPAIR  03/01/2012   Procedure: ROTATOR CUFF REPAIR SHOULDER OPEN;  Surgeon: Mable Paris, MD;  Location[facility] OR;  Service: Orthopedics;  Laterality: Left;  SUBSCAPULARIS REPAIR  . SVD     x 7  . TOTAL HIP ARTHROPLASTY Right 07/24/2016  . TOTAL HIP ARTHROPLASTY Right 07/24/2016   Procedure: RIGHT TOTAL HIP ARTHROPLASTY ANTERIOR APPROACH;  Surgeon: [name], MD;  Location[facility] OR;  Service: Orthopedics;  Laterality: Right;  . TOTAL  HIP ARTHROPLASTY Left 04/09/2017   Procedure: LEFT TOTAL HIP ARTHROPLASTY ANTERIOR APPROACH;  Surgeon: [name], MD;  Location: [facility];  Service: Orthopedics;  Laterality: Left;  . TOTAL SHOULDER ARTHROPLASTY  01/18/2012   Procedure: TOTAL SHOULDER ARTHROPLASTY;  Surgeon: Mable Paris, MD;  Location[facility] OR;  Service: Orthopedics;  Laterality: Left;  . TOTAL SHOULDER REPLACEMENT Left 01/19/2012  . TRANSTHORACIC ECHOCARDIOGRAM  06/16/2018   EF 60 to 65%.  GR 1 DD.  Normal mitral valve.  Mild aortic sclerosis but no stenosis.  . TUBAL LIGATION    . WISDOM TOOTH EXTRACTION       OB History   No obstetric history on file.      Home Medications    Prior to Admission medications   Medication Sig Start Date End Date Taking? Authorizing Provider  acetaminophen (TYLENOL) 500 MG tablet Take 500 mg by mouth every 6 (six) hours as needed.    [provider]  aspirin 81 MG chewable tablet Chew 81 mg by mouth daily.    [provider]  hydrOXYzine (ATARAX/VISTARIL) 25 MG tablet Take 1 tablet (25 mg total) by mouth 3 (three) times daily as needed. 01/26/20   [name], MD  midodrine (PROAMATINE) 10 MG tablet Take 1 tablet (10 mg total) by mouth 2 (two) times daily with a meal. 04/04/20   Marcine Matar, MD  mirtazapine (REMERON) 15 MG tablet Take 1 tablet (15 mg total) by mouth at bedtime. 04/15/20   [name], MD  Multiple Vitamins-Minerals (MULTI-VITAMIN GUMMIES PO) Take 1 tablet by mouth daily.     [provider]  polyethylene glycol powder (GLYCOLAX/MIRALAX) 17 GM/SCOOP powder Mix 1 capful of powder in drink and take by mouth one to three times daily as needed for daily soft stools OTC 04/19/20   Arby Barrette, MD  sennosides-docusate sodium (SENOKOT-S) 8.6-50 MG tablet Take 2 tablets by mouth daily. 04/19/20   Arby Barrette, MD  trimethoprim (TRIMPEX) 100 MG tablet Take 1 tablet (100 mg total) by mouth daily. 05/20/20   Kermit Balo, DO    Family History Family History  Problem Relation Age of Onset  . Heart disease Mother   . Heart disease Father   . Heart disease Sister   . Hypertension Sister   . Heart disease Sister   . Hypertension Sister   . Heart disease Sister   . Hypertension Sister     Social History Social History   Tobacco Use  . Smoking status: Former Smoker    Packs/day: 1.00    Years: 25.00    Pack years: 25.00    Types: Cigarettes    Quit date: 03/07/1984    Years since quitting: 36.2  . Smokeless tobacco: Former Clinical biochemist  . Vaping Use: Never used  Substance Use Topics  .  Alcohol use: No  . Drug use: No     Allergies   Effexor [venlafaxine hcl] and Sulfa drugs cross reactors   Review of Systems Review of Systems  As stated above in HPI Physical Exam Triage Vital Signs ED Triage Vitals  Enc Vitals Group     BP 05/25/20 1500 (!) 145/93     Pulse Rate 05/25/20 1500 80     Resp 05/25/20 1500 18     Temp 05/25/20 1500 98.4 F (36.9 C)     Temp Source 05/25/20 1500 Temporal     SpO2 05/25/20 1500 98 %     Weight --  Height --      Head Circumference --      Peak Flow --      Pain Score 05/25/20 1457 0     Pain Loc --      Pain Edu? --      Excl. in GC? --    No data found.  Updated Vital Signs BP (!) 145/93 (BP Location: Right Arm)   Pulse 80   Temp 98.4 F (36.9 C) (Temporal)   Resp 18   SpO2 98%   Physical Exam Vitals and nursing note reviewed. Exam conducted with a chaperone present.  Genitourinary:     UC Treatments / Results  Labs (all labs ordered are listed, but only abnormal results are displayed) Labs Reviewed - No data to display  EKG   Radiology No results found.  Procedures Procedures (including critical care time)  Medications Ordered in UC Medications - No data to display  Initial Impression / Assessment and Plan / UC Course  I have reviewed the triage vital signs and the nursing notes.  Pertinent labs & imaging results that were available during my care of the patient were reviewed by me and considered in my medical decision making (see chart for details).     New. Discussed with daughter that it appears that it has retracted on its own. Will prescribe Anusol- discussed how to use along with red flag signs and symptoms.    Final Clinical Impressions(s) / UC Diagnoses   Final diagnoses:  None   Discharge Instructions   None    ED Prescriptions    None     PDMP not reviewed this encounter.   Rushie Chestnut, [place] 05/25/20 1608

## 2020-05-25 NOTE — ED Triage Notes (Signed)
PT is present with her daughter and she states that pt a large bowel movement this morning and noticed her hemorrhoid was sticking out. Pt doesn't have any bleeding and no pain

## 2020-05-29 DIAGNOSIS — M79661 Pain in right lower leg: Secondary | ICD-10-CM | POA: Diagnosis not present

## 2020-05-29 DIAGNOSIS — M25551 Pain in right hip: Secondary | ICD-10-CM | POA: Diagnosis not present

## 2020-05-29 DIAGNOSIS — M79662 Pain in left lower leg: Secondary | ICD-10-CM | POA: Diagnosis not present

## 2020-05-31 ENCOUNTER — Ambulatory Visit (INDEPENDENT_AMBULATORY_CARE_PROVIDER_SITE_OTHER): Payer: MEDICARE | Admitting: Podiatry

## 2020-05-31 ENCOUNTER — Encounter: Payer: Self-pay | Admitting: Podiatry

## 2020-05-31 ENCOUNTER — Other Ambulatory Visit: Payer: Self-pay

## 2020-05-31 DIAGNOSIS — B351 Tinea unguium: Secondary | ICD-10-CM

## 2020-05-31 DIAGNOSIS — M2042 Other hammer toe(s) (acquired), left foot: Secondary | ICD-10-CM

## 2020-05-31 DIAGNOSIS — M79674 Pain in right toe(s): Secondary | ICD-10-CM

## 2020-05-31 DIAGNOSIS — M79675 Pain in left toe(s): Secondary | ICD-10-CM

## 2020-05-31 DIAGNOSIS — M2041 Other hammer toe(s) (acquired), right foot: Secondary | ICD-10-CM

## 2020-05-31 DIAGNOSIS — M2011 Hallux valgus (acquired), right foot: Secondary | ICD-10-CM

## 2020-05-31 DIAGNOSIS — M2012 Hallux valgus (acquired), left foot: Secondary | ICD-10-CM

## 2020-06-01 DIAGNOSIS — N183 Chronic kidney disease, stage 3 unspecified: Secondary | ICD-10-CM | POA: Diagnosis not present

## 2020-06-01 DIAGNOSIS — D649 Anemia, unspecified: Secondary | ICD-10-CM | POA: Diagnosis not present

## 2020-06-01 DIAGNOSIS — I1310 Hypertensive heart and chronic kidney disease without heart failure, with stage 1 through stage 4 chronic kidney disease, or unspecified chronic kidney disease: Secondary | ICD-10-CM | POA: Diagnosis not present

## 2020-06-01 DIAGNOSIS — I5189 Other ill-defined heart diseases: Secondary | ICD-10-CM | POA: Diagnosis not present

## 2020-06-01 DIAGNOSIS — F0390 Unspecified dementia without behavioral disturbance: Secondary | ICD-10-CM | POA: Diagnosis not present

## 2020-06-01 DIAGNOSIS — I959 Hypotension, unspecified: Secondary | ICD-10-CM | POA: Diagnosis not present

## 2020-06-03 ENCOUNTER — Encounter: Payer: Self-pay | Admitting: Internal Medicine

## 2020-06-05 ENCOUNTER — Other Ambulatory Visit: Payer: Self-pay | Admitting: Internal Medicine

## 2020-06-06 NOTE — Progress Notes (Signed)
Subjective:  [name] presents to clinic today with cc of  painful, thick, discolored, elongated toenails  of both feet that become tender and patient cannot cut because of thickness. Pain is aggravated when wearing enclosed shoe gear and relieved with periodic professional debridement.  Patient voices no new pedal concerns on today's visit.  Medications reviewed in chart.  Allergies  Allergen Reactions  . Effexor [Venlafaxine Hcl] Swelling and Other (See Comments)    MD ADVISES PATIENT TO NOT TAKE IN FUTURE PT STOPPED, MD DC'd MED  . Sulfa Drugs Cross Reactors Itching   Objective:  Physical Examination:  Vascular Examination: Capillary fill time to digits <3 seconds b/l. Faintly palpable pedal pulses b/l. Pedal hair sparse b/l. Skin temperature gradient within normal limits b/l. Trace edema noted b/l feet.  Dermatological Examination: Pedal skin with normal turgor, texture and tone bilaterally. No open wounds bilaterally. No interdigital macerations bilaterally. Toenails 1-5 b/l elongated, dystrophic, thickened, crumbly with subungual debris and tenderness to dorsal palpation.  Musculoskeletal Examination: No pain crepitus or joint limitation noted with ROM b/l. Hallux valgus with bunion deformity noted b/l. Hammertoes noted to the 2-5 bilaterally. Muscle strength 5/5 b/l with plantarflexion, inversion and eversion b/l. DF noted to be 2/5 b/l.  Neurological Examination: Protective sensation intact 5/5 intact bilaterally with 10g monofilament b/l. Vibratory sensation intact b/l. Proprioception intact bilaterally.  Assessment: 1. Pain due to onychomycosis of toenails of both feet   2. Hallux valgus, acquired, bilateral   3. Acquired hammertoes of both feet    Plan: -Examined patient. -Continue diabetic foot care principles. -Patient to continue soft, supportive shoe gear daily. -Toenails 1-5 b/l were debrided in length and girth with sterile nail nippers and dremel without  iatrogenic bleeding.  -Patient to report any pedal injuries to medical professional immediately. -Patient/POA to call should there be question/concern in the interim.

## 2020-06-07 ENCOUNTER — Other Ambulatory Visit: Payer: Self-pay | Admitting: Internal Medicine

## 2020-06-08 ENCOUNTER — Other Ambulatory Visit: Payer: Self-pay | Admitting: Internal Medicine

## 2020-06-08 NOTE — Telephone Encounter (Signed)
[facility] does not refill for this provider- routed to PCP.

## 2020-06-10 ENCOUNTER — Other Ambulatory Visit: Payer: Self-pay | Admitting: *Deleted

## 2020-06-10 MED ORDER — HYDROXYZINE HCL 25 MG PO TABS: 25.0000 mg | ORAL_TABLET | Freq: Three times a day (TID) | ORAL | 5 refills | Status: AC | PRN

## 2020-06-10 MED ORDER — MIRTAZAPINE 15 MG PO TABS: 15.0000 mg | ORAL_TABLET | Freq: Every day | ORAL | 5 refills | Status: DC

## 2020-06-10 MED ORDER — TRIMETHOPRIM 100 MG PO TABS: 100.0000 mg | ORAL_TABLET | Freq: Every day | ORAL | 5 refills | Status: DC

## 2020-06-10 NOTE — Telephone Encounter (Signed)
Patient daughter called and stated that patient was seen a couple of weeks ago to establish. Requested refills but pharmacy has not received them yet.   Pended Rx's and sent to Dr. Renato Gails for approval.

## 2020-06-10 NOTE — Telephone Encounter (Signed)
Apparently CMA at that time did not send them.

## 2020-06-17 ENCOUNTER — Emergency Department (HOSPITAL_COMMUNITY): Payer: MEDICARE

## 2020-06-17 ENCOUNTER — Emergency Department (HOSPITAL_COMMUNITY)
Admission: EM | Admit: 2020-06-17 | Discharge: 2020-06-18 | Disposition: A | Discharge status: Home / Self Care | Payer: MEDICARE | Attending: Emergency Medicine | Admitting: Emergency Medicine

## 2020-06-17 ENCOUNTER — Encounter (HOSPITAL_COMMUNITY): Payer: Self-pay | Admitting: Emergency Medicine

## 2020-06-17 ENCOUNTER — Other Ambulatory Visit: Payer: Self-pay

## 2020-06-17 DIAGNOSIS — I6529 Occlusion and stenosis of unspecified carotid artery: Secondary | ICD-10-CM | POA: Diagnosis not present

## 2020-06-17 DIAGNOSIS — J358 Other chronic diseases of tonsils and adenoids: Secondary | ICD-10-CM | POA: Diagnosis not present

## 2020-06-17 DIAGNOSIS — Z79899 Other long term (current) drug therapy: Secondary | ICD-10-CM | POA: Insufficient documentation

## 2020-06-17 DIAGNOSIS — Z96652 Presence of left artificial knee joint: Secondary | ICD-10-CM | POA: Diagnosis not present

## 2020-06-17 DIAGNOSIS — M19012 Primary osteoarthritis, left shoulder: Secondary | ICD-10-CM | POA: Diagnosis not present

## 2020-06-17 DIAGNOSIS — W01198A Fall on same level from slipping, tripping and stumbling with subsequent striking against other object, initial encounter: Secondary | ICD-10-CM | POA: Diagnosis not present

## 2020-06-17 DIAGNOSIS — I10 Essential (primary) hypertension: Secondary | ICD-10-CM | POA: Diagnosis not present

## 2020-06-17 DIAGNOSIS — Z87891 Personal history of nicotine dependence: Secondary | ICD-10-CM | POA: Diagnosis not present

## 2020-06-17 DIAGNOSIS — Z96643 Presence of artificial hip joint, bilateral: Secondary | ICD-10-CM | POA: Diagnosis not present

## 2020-06-17 DIAGNOSIS — S0101XA Laceration without foreign body of scalp, initial encounter: Secondary | ICD-10-CM | POA: Diagnosis not present

## 2020-06-17 DIAGNOSIS — M19011 Primary osteoarthritis, right shoulder: Secondary | ICD-10-CM | POA: Diagnosis not present

## 2020-06-17 DIAGNOSIS — G309 Alzheimer's disease, unspecified: Secondary | ICD-10-CM | POA: Diagnosis not present

## 2020-06-17 DIAGNOSIS — Z96612 Presence of left artificial shoulder joint: Secondary | ICD-10-CM | POA: Diagnosis not present

## 2020-06-17 DIAGNOSIS — W19XXXA Unspecified fall, initial encounter: Secondary | ICD-10-CM

## 2020-06-17 DIAGNOSIS — F0280 Dementia in other diseases classified elsewhere without behavioral disturbance: Secondary | ICD-10-CM | POA: Diagnosis not present

## 2020-06-17 DIAGNOSIS — S0990XA Unspecified injury of head, initial encounter: Secondary | ICD-10-CM | POA: Diagnosis not present

## 2020-06-17 DIAGNOSIS — S199XXA Unspecified injury of neck, initial encounter: Secondary | ICD-10-CM | POA: Diagnosis not present

## 2020-06-17 DIAGNOSIS — Z7982 Long term (current) use of aspirin: Secondary | ICD-10-CM | POA: Insufficient documentation

## 2020-06-17 LAB — BASIC METABOLIC PANEL
Anion gap: 10 (ref 5–15)
BUN: 23 mg/dL (ref 8–23)
CO2: 25 mmol/L (ref 22–32)
Calcium: 9.8 mg/dL (ref 8.9–10.3)
Chloride: 103 mmol/L (ref 98–111)
Creatinine, Ser: 1.32 mg/dL — ABNORMAL HIGH (ref 0.44–1.00)
GFR, Estimated: 42 mL/min — ABNORMAL LOW (ref >60)
Glucose, Bld: 110 mg/dL — ABNORMAL HIGH (ref 70–99)
Potassium: 5 mmol/L (ref 3.5–5.1)
Sodium: 138 mmol/L (ref 135–145)

## 2020-06-17 LAB — CBC WITH DIFFERENTIAL/PLATELET
Abs Immature Granulocytes: 0.02 10*3/uL (ref 0.00–0.07)
Basophils Absolute: 0.1 10*3/uL (ref 0.0–0.1)
Basophils Relative: 1 %
Eosinophils Absolute: 0.1 10*3/uL (ref 0.0–0.5)
Eosinophils Relative: 3 %
HCT: 36 % (ref 36.0–46.0)
Hemoglobin: 11.1 g/dL — ABNORMAL LOW (ref 12.0–15.0)
Immature Granulocytes: 0 %
Lymphocytes Relative: 33 %
Lymphs Abs: 1.6 10*3/uL (ref 0.7–4.0)
MCH: 30.3 pg (ref 26.0–34.0)
MCHC: 30.8 g/dL (ref 30.0–36.0)
MCV: 98.4 fL (ref 80.0–100.0)
Monocytes Absolute: 0.5 10*3/uL (ref 0.1–1.0)
Monocytes Relative: 10 %
Neutro Abs: 2.5 10*3/uL (ref 1.7–7.7)
Neutrophils Relative %: 53 %
Platelets: 283 10*3/uL (ref 150–400)
RBC: 3.66 MIL/uL — ABNORMAL LOW (ref 3.87–5.11)
RDW: 14.1 % (ref 11.5–15.5)
WBC: 4.7 10*3/uL (ref 4.0–10.5)
nRBC: 0 % (ref 0.0–0.2)

## 2020-06-17 NOTE — Discharge Instructions (Addendum)
Clean the laceration gently twice a day with soap and water.  Go to your family doctor or an urgent care to have the staples removed and 1 week.  You have 2 staples

## 2020-06-17 NOTE — ED Provider Notes (Signed)
[facility]-EMERGENCY DEPT Provider Note   CSN: [id number] Arrival date & time: 06/17/20  1702     History Chief Complaint  Patient presents with  . Fall    [name] is a 80 y.o. female.  Patient slipped and fell hit her head.  She walks with a walker and sometimes she does fall.  Patient did not have any dizziness or weakness.  The history is provided by the patient and a relative. No language interpreter was used.  Fall This is a new problem. The current episode started 1 to 2 hours ago. The problem occurs every several days. The problem has not changed since onset.Pertinent negatives include no chest pain, no abdominal pain and no headaches. Nothing aggravates the symptoms. She has tried nothing for the symptoms. The treatment provided no relief.       Past Medical History:  Diagnosis Date  . Anemia    on iron  . Anxiety   . Arthritis    shoulders, knees, hands, hips; status post left knee surgery  . Chronic bronchitis (HCC)    CXR 07/08/16  . Depression   . GERD (gastroesophageal reflux disease)    TAKES TUMS  . H/O: pneumonia 2003  . Hyperlipidemia    on med  . Hypertension   . Morbid obesity with BMI of 45.0-49.9, adult (HCC)   . Shingles    recent 02/2017  . Thoracic aortic atherosclerosis (HCC)    CXR 07/08/16    Patient Active Problem List   Diagnosis Date Noted  . Failure to thrive in adult 05/20/2020  . Venous insufficiency 05/20/2020  . Depression, major, single episode, in partial remission (HCC) 05/20/2020  . Late onset Alzheimer's dementia with behavioral disturbance (HCC) 05/20/2020  . History of fall 04/05/2020  . Chronic anemia 04/05/2020  . Bowel and bladder incontinence 04/05/2020  . AKI (acute kidney injury) (HCC) 02/26/2020  . Hypotension 02/26/2020  . Weakness   . Midline cystocele 05/03/2019  . OAB (overactive bladder) 05/03/2019  . Urinary retention 05/03/2019  . Uterine procidentia 05/03/2019  . Chest  pain, negative stress test. non cardiac. 06/16/2018  . Primary osteoarthritis of left hip 01/20/2017  . Primary osteoarthritis of right hip 11/10/2015  . Anxiety state 11/10/2015  . Preop cardiovascular exam 07/23/2014  . Bilateral edema of lower extremity 07/18/2013    Class: Chronic  . Metabolic syndrome 07/18/2013  . Rectocele 07/07/2012  . Essential hypertension 07/07/2012  . GERD (gastroesophageal reflux disease) 07/07/2012  . Hyperlipidemia with target LDL less than 100 07/07/2012  . Rotator cuff tear, left 03/02/2012  . Osteoarthritis of left shoulder 01/21/2012    Past Surgical History:  Procedure Laterality Date  . CARDIAC CATHETERIZATION  04/20/1996   patent coronaries, EF 74% (Dr. Aram Candela)  . CARPAL TUNNEL RELEASE Left   . CATARACT EXTRACTION, BILATERAL    . COLONOSCOPY    . KNEE SURGERY Left 11/2001  . NM MYOVIEW LTD  06/17/2018   EF 60-65 %.  Apical thinning.  LOW RISK no ischemia or infarction.  . REPLACEMENT TOTAL KNEE Left 10/2009  . REPLACEMENT TOTAL KNEE Left 10/2009  . SHOULDER OPEN ROTATOR CUFF REPAIR  03/01/2012   Procedure: ROTATOR CUFF REPAIR SHOULDER OPEN;  Surgeon: Mable Paris, MD;  Location: [facility] OR;  Service: Orthopedics;  Laterality: Left;  SUBSCAPULARIS REPAIR  . SVD     x 7  . TOTAL HIP ARTHROPLASTY Right 07/24/2016  . TOTAL HIP ARTHROPLASTY Right 07/24/2016   Procedure:  RIGHT TOTAL HIP ARTHROPLASTY ANTERIOR APPROACH;  Surgeon: [name], MD;  Location: [facility] OR;  Service: Orthopedics;  Laterality: Right;  . TOTAL HIP ARTHROPLASTY Left 04/09/2017   Procedure: LEFT TOTAL HIP ARTHROPLASTY ANTERIOR APPROACH;  Surgeon: [name], MD;  Location: [facility] ORS;  Service: Orthopedics;  Laterality: Left;  . TOTAL SHOULDER ARTHROPLASTY  01/18/2012   Procedure: TOTAL SHOULDER ARTHROPLASTY;  Surgeon: Mable Paris, MD;  Location: [facility] OR;  Service: Orthopedics;  Laterality: Left;  . TOTAL SHOULDER REPLACEMENT Left 01/19/2012  . TRANSTHORACIC  ECHOCARDIOGRAM  06/16/2018   EF 60 to 65%.  GR 1 DD.  Normal mitral valve.  Mild aortic sclerosis but no stenosis.  . TUBAL LIGATION    . WISDOM TOOTH EXTRACTION       OB History   No obstetric history on file.     Family History  Problem Relation Age of Onset  . Heart disease Mother   . Heart disease Father   . Heart disease Sister   . Hypertension Sister   . Heart disease Sister   . Hypertension Sister   . Heart disease Sister   . Hypertension Sister     Social History   Tobacco Use  . Smoking status: Former Smoker    Packs/day: 1.00    Years: 25.00    Pack years: 25.00    Types: Cigarettes    Quit date: 03/07/1984    Years since quitting: 36.3  . Smokeless tobacco: Former Clinical biochemist  . Vaping Use: Never used  Substance Use Topics  . Alcohol use: No  . Drug use: No    Home Medications Prior to Admission medications   Medication Sig Start Date End Date Taking? Authorizing Provider  acetaminophen (TYLENOL) 500 MG tablet Take 500 mg by mouth every 6 (six) hours as needed.    [provider]  aspirin 81 MG chewable tablet Chew 81 mg by mouth daily.    [provider]  hydrocortisone (ANUSOL-HC) 2.5 % rectal cream Place 1 application rectally 2 (two) times daily. 05/25/20   Rushie Chestnut, PA-C  hydrOXYzine (ATARAX/VISTARIL) 25 MG tablet Take 1 tablet (25 mg total) by mouth 3 (three) times daily as needed. 06/10/20   Reed, Tiffany L, DO  midodrine (PROAMATINE) 10 MG tablet TAKE 1 TABLET BY MOUTH TWICE A DAY WITH A MEAL 06/10/20   Reed, Tiffany L, DO  mirtazapine (REMERON) 15 MG tablet Take 1 tablet (15 mg total) by mouth at bedtime. 06/10/20   Reed, Tiffany L, DO  Multiple Vitamins-Minerals (MULTI-VITAMIN GUMMIES PO) Take 1 tablet by mouth daily.     [provider]  polyethylene glycol powder (GLYCOLAX/MIRALAX) 17 GM/SCOOP powder Mix 1 capful of powder in drink and take by mouth one to three times daily as needed for daily soft stools  OTC 04/19/20   Arby Barrette, MD  sennosides-docusate sodium (SENOKOT-S) 8.6-50 MG tablet Take 2 tablets by mouth daily. 04/19/20   Arby Barrette, MD  trimethoprim (TRIMPEX) 100 MG tablet Take 1 tablet (100 mg total) by mouth daily. 06/10/20   Reed, Tiffany L, DO    Allergies    Effexor [venlafaxine hcl] and Sulfa drugs cross reactors  Review of Systems   Review of Systems  Constitutional: Negative for appetite change and fatigue.  HENT: Negative for congestion, ear discharge and sinus pressure.   Eyes: Negative for discharge.  Respiratory: Negative for cough.   Cardiovascular: Negative for chest pain.  Gastrointestinal: Negative for abdominal pain and diarrhea.  Genitourinary: Negative for frequency and hematuria.  Musculoskeletal: Negative for back pain.  Skin: Negative for rash.  Neurological: Negative for seizures and headaches.       Fall  Psychiatric/Behavioral: Negative for hallucinations.    Physical Exam Updated Vital Signs BP 114/74   Pulse 66   Temp 98.2 F (36.8 C) (Oral)   Resp (!) 23   Ht  (1.676 m)   Wt 67.1 kg   SpO2 100%   BMI 23.88 kg/m   Physical Exam Vitals reviewed.  Constitutional:      Appearance: She is well-developed.  HENT:     Head: Normocephalic.     Comments: 1 cm laceration to occipital head    Nose: Nose normal.  Eyes:     General: No scleral icterus.    Extraocular Movements: EOM normal.     Conjunctiva/sclera: Conjunctivae normal.  Neck:     Thyroid: No thyromegaly.  Cardiovascular:     Rate and Rhythm: Normal rate and regular rhythm.     Heart sounds: No murmur heard. No friction rub. No gallop.   Pulmonary:     Breath sounds: No stridor. No wheezing or rales.  Chest:     Chest wall: No tenderness.  Abdominal:     General: There is no distension.     Tenderness: There is no abdominal tenderness. There is no rebound.  Musculoskeletal:        General: No edema. Normal range of motion.     Cervical back: Neck supple.   Lymphadenopathy:     Cervical: No cervical adenopathy.  Skin:    Findings: No erythema or rash.  Neurological:     Motor: No abnormal muscle tone.     Coordination: Coordination normal.     Comments: Patient is demented Oriented to person  Psychiatric:        Mood and Affect: Mood and affect normal.        Behavior: Behavior normal.     ED Results / Procedures / Treatments   Labs (all labs ordered are listed, but only abnormal results are displayed) Labs Reviewed  CBC WITH DIFFERENTIAL/PLATELET - Abnormal; Notable for the following components:      Result Value   RBC 3.66 (*)    Hemoglobin 11.1 (*)    All other components within normal limits  BASIC METABOLIC PANEL - Abnormal; Notable for the following components:   Glucose, Bld 110 (*)    Creatinine, Ser 1.32 (*)    GFR, Estimated 42 (*)    All other components within normal limits    EKG None  Radiology CT Head Wo Contrast  Result Date: 06/17/2020 CLINICAL DATA:  Fall at care facility, laceration of the right side of the head EXAM: CT HEAD WITHOUT CONTRAST CT CERVICAL SPINE WITHOUT CONTRAST TECHNIQUE: Multidetector CT imaging of the head and cervical spine was performed following the standard protocol without intravenous contrast. Multiplanar CT image reconstructions of the cervical spine were also generated. COMPARISON:  CT head 02/26/2020, CT head and cervical spine 02/10/2020, MRI 08/07/2019 FINDINGS: CT HEAD FINDINGS Brain: No evidence of acute infarction, hemorrhage, hydrocephalus, extra-axial collection, visible mass lesion or mass effect. Symmetric prominence of the ventricles, cisterns and sulci compatible with parenchymal volume loss. Patchy areas of white matter hypoattenuation are most compatible with chronic microvascular angiopathy. Vascular: Atherosclerotic calcification of the carotid siphons. No hyperdense vessel. Skull: Right frontoparietal scalp swelling and laceration. No subjacent calvarial fracture. Mild  hyperostosis frontalis interna. No worrisome osseous lesion.  Sinuses/Orbits: Paranasal sinuses and mastoid air cells are predominantly clear. Included orbital structures are unremarkable. Other: None CT CERVICAL SPINE FINDINGS Alignment: Cervical stabilization collar is absent at the time of examination. Reversal of the normal cervical lordosis. Unchanged likely degenerative retrolisthesis C4 on 5. No evidence of traumatic listhesis. No abnormally widened, perched or jumped facets. Normal alignment of the craniocervical and atlantoaxial articulations. Skull base and vertebrae: No acute skull base fracture. No vertebral body fracture or height loss. Normal bone mineralization. No worrisome osseous lesions. Cervical spondylitic change as detailed below. Additional arthrosis at the atlantodental and basion dens intervals, stable from prior. Incomplete fusion of the posterior arch T1, T2 anatomic variant. Soft tissues and spinal canal: No pre or paravertebral fluid or swelling. No visible canal hematoma. Airways patent. Cervical carotid atherosclerosis. Bilateral calcified tonsilloliths. Disc levels: Multilevel intervertebral disc height loss with spondylitic endplate changes. Disc osteophyte complex C3-4, C4-5 may result in some mild canal stenosis with partial effacement of ventral thecal sac and the lower cervical levels as well. Multilevel uncinate spurring facet hypertrophic changes result in mild-to-moderate multilevel neural foraminal narrowing as well. Upper chest: No acute abnormality in the upper chest or imaged lung apices. Extensive arthrosis in the bilateral sternoclavicular joints. Severe degenerative changes in the right shoulder noted incidentally. Prior left shoulder arthroplasty. Other: No visible concerning thyroid nodules or masses though evaluation limited by streak artifact. IMPRESSION: 1. Right frontoparietal scalp swelling and laceration without subjacent calvarial fracture or acute intracranial  abnormality. 2. Stable parenchymal volume loss and chronic microvascular ischemic white matter disease. 3. No evidence of acute fracture or subluxation of the cervical spine. 4. Multilevel degenerative changes of the cervical spine, as described. 5. Cervical and intracranial atherosclerosis. Electronically Signed   By: Kreg Shropshire M.D.   On: 06/17/2020 19:28   CT Cervical Spine Wo Contrast  Result Date: 06/17/2020 CLINICAL DATA:  Fall at care facility, laceration of the right side of the head EXAM: CT HEAD WITHOUT CONTRAST CT CERVICAL SPINE WITHOUT CONTRAST TECHNIQUE: Multidetector CT imaging of the head and cervical spine was performed following the standard protocol without intravenous contrast. Multiplanar CT image reconstructions of the cervical spine were also generated. COMPARISON:  CT head 02/26/2020, CT head and cervical spine 02/10/2020, MRI 08/07/2019 FINDINGS: CT HEAD FINDINGS Brain: No evidence of acute infarction, hemorrhage, hydrocephalus, extra-axial collection, visible mass lesion or mass effect. Symmetric prominence of the ventricles, cisterns and sulci compatible with parenchymal volume loss. Patchy areas of white matter hypoattenuation are most compatible with chronic microvascular angiopathy. Vascular: Atherosclerotic calcification of the carotid siphons. No hyperdense vessel. Skull: Right frontoparietal scalp swelling and laceration. No subjacent calvarial fracture. Mild hyperostosis frontalis interna. No worrisome osseous lesion. Sinuses/Orbits: Paranasal sinuses and mastoid air cells are predominantly clear. Included orbital structures are unremarkable. Other: None CT CERVICAL SPINE FINDINGS Alignment: Cervical stabilization collar is absent at the time of examination. Reversal of the normal cervical lordosis. Unchanged likely degenerative retrolisthesis C4 on 5. No evidence of traumatic listhesis. No abnormally widened, perched or jumped facets. Normal alignment of the craniocervical and  atlantoaxial articulations. Skull base and vertebrae: No acute skull base fracture. No vertebral body fracture or height loss. Normal bone mineralization. No worrisome osseous lesions. Cervical spondylitic change as detailed below. Additional arthrosis at the atlantodental and basion dens intervals, stable from prior. Incomplete fusion of the posterior arch T1, T2 anatomic variant. Soft tissues and spinal canal: No pre or paravertebral fluid or swelling. No visible canal hematoma. Airways patent. Cervical carotid  atherosclerosis. Bilateral calcified tonsilloliths. Disc levels: Multilevel intervertebral disc height loss with spondylitic endplate changes. Disc osteophyte complex C3-4, C4-5 may result in some mild canal stenosis with partial effacement of ventral thecal sac and the lower cervical levels as well. Multilevel uncinate spurring facet hypertrophic changes result in mild-to-moderate multilevel neural foraminal narrowing as well. Upper chest: No acute abnormality in the upper chest or imaged lung apices. Extensive arthrosis in the bilateral sternoclavicular joints. Severe degenerative changes in the right shoulder noted incidentally. Prior left shoulder arthroplasty. Other: No visible concerning thyroid nodules or masses though evaluation limited by streak artifact. IMPRESSION: 1. Right frontoparietal scalp swelling and laceration without subjacent calvarial fracture or acute intracranial abnormality. 2. Stable parenchymal volume loss and chronic microvascular ischemic white matter disease. 3. No evidence of acute fracture or subluxation of the cervical spine. 4. Multilevel degenerative changes of the cervical spine, as described. 5. Cervical and intracranial atherosclerosis. Electronically Signed   By: Kreg ShropshirePrice  DeHay M.D.   On: 06/17/2020 19:28    Procedures .Marland Kitchen.Laceration Repair  Date/Time: 06/17/2020 7:49 PM Performed by: Bethann BerkshireZammit, Lanett Lasorsa, MD Authorized by: Bethann BerkshireZammit, Franck Vinal, MD   Comments:     Patient is  a 1 cm laceration to the right occipital area.  Area was cleaned thoroughly with Betadine.  2 staples were used to close the laceration.  Patient tolerated the procedure well     Medications Ordered in ED Medications - No data to display  ED Course  I have reviewed the triage vital signs and the nursing notes.  Pertinent labs & imaging results that were available during my care of the patient were reviewed by me and considered in my medical decision making (see chart for details).    MDM Rules/Calculators/A&P                          Mechanical fall with laceration to head.  CT scan negative.  She will follow up with her PCP.  Sutures out in 1 week Final Clinical Impression(s) / ED Diagnoses Final diagnoses:  Fall, initial encounter  Injury of head, initial encounter    Rx / DC Orders ED Discharge Orders    None       Bethann BerkshireZammit, Cecilie Heidel, MD 06/17/20 1950

## 2020-06-17 NOTE — ED Notes (Addendum)
Bed alarm on Yellow socks Fall risk armband Fall risk sign outside door 

## 2020-06-17 NOTE — ED Notes (Signed)
Pt returning home with her daughter. Discharge instructions discussed

## 2020-06-17 NOTE — ED Notes (Signed)
PT JUST RETURNED FROM CT

## 2020-06-17 NOTE — ED Notes (Signed)
Patient attached to external female catheter Clean/dry/intact

## 2020-06-17 NOTE — ED Triage Notes (Signed)
Pt BIB [facility] with c/o fall. Per EMS, pt was at [facility] and had a witnessed fall. Pt did hit her head and has a laceration on the right side of head. Bleeding controlled. Denies any use of blood thinners. Pt is A &O x1 to self and has a hx of dementia and HTN.

## 2020-06-18 NOTE — ED Notes (Signed)
Delay in removing Pt from system d/t glitch.

## 2020-06-24 ENCOUNTER — Ambulatory Visit
Admission: RE | Admit: 2020-06-24 | Discharge: 2020-06-24 | Disposition: A | Discharge status: Home / Self Care | Payer: MEDICARE | Source: Ambulatory Visit | Attending: Internal Medicine | Admitting: Internal Medicine

## 2020-06-24 ENCOUNTER — Other Ambulatory Visit: Payer: Self-pay

## 2020-06-24 NOTE — Discharge Instructions (Signed)
Return as needed

## 2020-06-24 NOTE — ED Triage Notes (Signed)
Patient seen at [facility] long 3/7.  Laceration repair.  ED note reflects 2 staples in scalp

## 2020-07-08 ENCOUNTER — Other Ambulatory Visit: Payer: Self-pay

## 2020-07-08 ENCOUNTER — Ambulatory Visit
Admission: EM | Admit: 2020-07-08 | Discharge: 2020-07-08 | Disposition: A | Discharge status: Home / Self Care | Payer: MEDICARE

## 2020-07-14 ENCOUNTER — Other Ambulatory Visit: Payer: Self-pay | Admitting: Internal Medicine

## 2020-07-25 NOTE — Telephone Encounter (Signed)
Opened in error

## 2020-08-06 ENCOUNTER — Ambulatory Visit
Admission: EM | Admit: 2020-08-06 | Discharge: 2020-08-06 | Disposition: A | Discharge status: Home / Self Care | Payer: MEDICARE | Attending: Emergency Medicine | Admitting: Emergency Medicine

## 2020-08-06 ENCOUNTER — Other Ambulatory Visit: Payer: Self-pay

## 2020-08-06 DIAGNOSIS — S81802A Unspecified open wound, left lower leg, initial encounter: Secondary | ICD-10-CM

## 2020-08-06 HISTORY — DX: Unspecified dementia, unspecified severity, without behavioral disturbance, psychotic disturbance, mood disturbance, and anxiety: F03.90

## 2020-08-06 MED ORDER — DOXYCYCLINE HYCLATE 100 MG PO CAPS: 100.0000 mg | ORAL_CAPSULE | Freq: Two times a day (BID) | ORAL | 0 refills | Status: AC

## 2020-08-06 NOTE — Discharge Instructions (Signed)
Begin doxycycline twice daily for 10 days Warm compresses to help soften the area, gentle massage Tylenol as needed for pain Follow-up if not improving or worsening

## 2020-08-06 NOTE — ED Provider Notes (Signed)
[facility]    CSN: [id number] Arrival date & time: 08/06/20  0803      History   Chief Complaint Chief Complaint  Patient presents with  . leg wound    HPI [name] is a 80 y.o. female history of dementia, hypertension, GERD, hyperlipidemia, presenting today for evaluation of leg injury.  Patient presents with her daughter and notes that she has developed a wound to her right upper leg.  Unsure exactly how this happened.  Noticed at adult daycare.  Reports associated pain with this area.  Denies similar irregularity on left.  Denies fevers.  Denies possible fall or concern for injury.  Patient ambulating at baseline.  HPI  Past Medical History:  Diagnosis Date  . Anemia    on iron  . Anxiety   . Arthritis    shoulders, knees, hands, hips; status post left knee surgery  . Chronic bronchitis (HCC)    CXR 07/08/16  . Dementia (HCC)   . Depression   . GERD (gastroesophageal reflux disease)    TAKES TUMS  . H/O: pneumonia 2003  . Hyperlipidemia    on med  . Hypertension   . Morbid obesity with BMI of 45.0-49.9, adult (HCC)   . Shingles    recent 02/2017  . Thoracic aortic atherosclerosis (HCC)    CXR 07/08/16    Patient Active Problem List   Diagnosis Date Noted  . Failure to thrive in adult 05/20/2020  . Venous insufficiency 05/20/2020  . Depression, major, single episode, in partial remission (HCC) 05/20/2020  . Late onset Alzheimer's dementia with behavioral disturbance (HCC) 05/20/2020  . History of fall 04/05/2020  . Chronic anemia 04/05/2020  . Bowel and bladder incontinence 04/05/2020  . AKI (acute kidney injury) (HCC) 02/26/2020  . Hypotension 02/26/2020  . Weakness   . Midline cystocele 05/03/2019  . OAB (overactive bladder) 05/03/2019  . Urinary retention 05/03/2019  . Uterine procidentia 05/03/2019  . Chest pain, negative stress test. non cardiac. 06/16/2018  . Primary osteoarthritis of left hip 01/20/2017  . Primary  osteoarthritis of right hip 11/10/2015  . Anxiety state 11/10/2015  . Preop cardiovascular exam 07/23/2014  . Bilateral edema of lower extremity 07/18/2013    Class: Chronic  . Metabolic syndrome 07/18/2013  . Rectocele 07/07/2012  . Essential hypertension 07/07/2012  . GERD (gastroesophageal reflux disease) 07/07/2012  . Hyperlipidemia with target LDL less than 100 07/07/2012  . Rotator cuff tear, left 03/02/2012  . Osteoarthritis of left shoulder 01/21/2012    Past Surgical History:  Procedure Laterality Date  . CARDIAC CATHETERIZATION  04/20/1996   patent coronaries, EF 74% (Dr. Aram CandelaW. Gamble)  . CARPAL TUNNEL RELEASE Left   . CATARACT EXTRACTION, BILATERAL    . COLONOSCOPY    . KNEE SURGERY Left 11/2001  . NM MYOVIEW LTD  06/17/2018   EF 60-65 %.  Apical thinning.  LOW RISK no ischemia or infarction.  . REPLACEMENT TOTAL KNEE Left 10/2009  . REPLACEMENT TOTAL KNEE Left 10/2009  . SHOULDER OPEN ROTATOR CUFF REPAIR  03/01/2012   Procedure: ROTATOR CUFF REPAIR SHOULDER OPEN;  Surgeon: Mable ParisJustin William Chandler, MD;  [facility];  Service: Orthopedics;  Laterality: Left;  SUBSCAPULARIS REPAIR  . SVD     x 7  . TOTAL HIP ARTHROPLASTY Right 07/24/2016  . TOTAL HIP ARTHROPLASTY Right 07/24/2016   Procedure: RIGHT TOTAL HIP ARTHROPLASTY ANTERIOR APPROACH;  Surgeon: Jodi GeraldsJohn Graves, MD;  Location: [facility] OR;  Service: Orthopedics;  Laterality: Right;  .  TOTAL HIP ARTHROPLASTY Left 04/09/2017   Procedure: LEFT TOTAL HIP ARTHROPLASTY ANTERIOR APPROACH;  Surgeon: [name], MD;  Location: [facility] ORS;  Service: Orthopedics;  Laterality: Left;  . TOTAL SHOULDER ARTHROPLASTY  01/18/2012   Procedure: TOTAL SHOULDER ARTHROPLASTY;  Surgeon: Mable Paris, MD;  Location: [facility] OR;  Service: Orthopedics;  Laterality: Left;  . TOTAL SHOULDER REPLACEMENT Left 01/19/2012  . TRANSTHORACIC ECHOCARDIOGRAM  06/16/2018   EF 60 to 65%.  GR 1 DD.  Normal mitral valve.  Mild aortic sclerosis but no stenosis.  .  TUBAL LIGATION    . WISDOM TOOTH EXTRACTION      OB History   No obstetric history on file.      Home Medications    Prior to Admission medications   Medication Sig Start Date End Date Taking? Authorizing Provider  doxycycline (VIBRAMYCIN) 100 MG capsule Take 1 capsule (100 mg total) by mouth 2 (two) times daily for 10 days. 08/06/20 08/16/20 Yes Lizzeth Meder C, PA-C  midodrine (PROAMATINE) 10 MG tablet TAKE 1 TABLET BY MOUTH TWICE A DAY WITH MEALS 07/15/20   Fargo, Amy E, NP  acetaminophen (TYLENOL) 500 MG tablet Take 500 mg by mouth every 6 (six) hours as needed.    [provider]  aspirin 81 MG chewable tablet Chew 81 mg by mouth daily.    [provider]  hydrocortisone (ANUSOL-HC) 2.5 % rectal cream Place 1 application rectally 2 (two) times daily. 05/25/20   Rushie Chestnut, PA-C  hydrOXYzine (ATARAX/VISTARIL) 25 MG tablet Take 1 tablet (25 mg total) by mouth 3 (three) times daily as needed. 06/10/20   Reed, Tiffany L, DO  mirtazapine (REMERON) 15 MG tablet Take 1 tablet (15 mg total) by mouth at bedtime. 06/10/20   Reed, Tiffany L, DO  Multiple Vitamins-Minerals (MULTI-VITAMIN GUMMIES PO) Take 1 tablet by mouth daily.     [provider]  polyethylene glycol powder (GLYCOLAX/MIRALAX) 17 GM/SCOOP powder Mix 1 capful of powder in drink and take by mouth one to three times daily as needed for daily soft stools OTC 04/19/20   Arby Barrette, MD  sennosides-docusate sodium (SENOKOT-S) 8.6-50 MG tablet Take 2 tablets by mouth daily. 04/19/20   Arby Barrette, MD  trimethoprim (TRIMPEX) 100 MG tablet Take 1 tablet (100 mg total) by mouth daily. 06/10/20   Kermit Balo, DO    Family History Family History  Problem Relation Age of Onset  . Heart disease Mother   . Heart disease Father   . Heart disease Sister   . Hypertension Sister   . Heart disease Sister   . Hypertension Sister   . Heart disease Sister   . Hypertension Sister     Social  History Social History   Tobacco Use  . Smoking status: Former Smoker    Packs/day: 1.00    Years: 25.00    Pack years: 25.00    Types: Cigarettes    Quit date: 03/07/1984    Years since quitting: 36.4  . Smokeless tobacco: Former Clinical biochemist  . Vaping Use: Never used  Substance Use Topics  . Alcohol use: No  . Drug use: No     Allergies   Effexor [venlafaxine hcl] and Sulfa drugs cross reactors   Review of Systems Review of Systems  Constitutional: Negative for fatigue and fever.  Eyes: Negative for visual disturbance.  Respiratory: Negative for shortness of breath.   Cardiovascular: Negative for chest pain.  Gastrointestinal: Negative for abdominal pain, nausea  and vomiting.  Musculoskeletal: Positive for gait problem. Negative for arthralgias and joint swelling.  Skin: Positive for color change and wound. Negative for rash.  Neurological: Negative for dizziness, weakness, light-headedness and headaches.     Physical Exam Triage Vital Signs ED Triage Vitals  Enc Vitals Group     BP      Pulse      Resp      Temp      Temp src      SpO2      Weight      Height      Head Circumference      Peak Flow      Pain Score      Pain Loc      Pain Edu?      Excl. in GC?    No data found.  Updated Vital Signs BP 131/80 (BP Location: Left Arm)   Pulse 64   Temp 97.9 F (36.6 C) (Oral)   Resp 18   SpO2 94%   Visual Acuity Right Eye Distance:   Left Eye Distance:   Bilateral Distance:    Right Eye Near:   Left Eye Near:    Bilateral Near:     Physical Exam Vitals and nursing note reviewed.  Constitutional:      Appearance: She is well-developed.     Comments: No acute distress  HENT:     Head: Normocephalic and atraumatic.     Nose: Nose normal.  Eyes:     Conjunctiva/sclera: Conjunctivae normal.  Cardiovascular:     Rate and Rhythm: Normal rate.  Pulmonary:     Effort: Pulmonary effort is normal. No respiratory distress.  Abdominal:      General: There is no distension.  Musculoskeletal:        General: Normal range of motion.     Cervical back: Neck supple.  Skin:    General: Skin is warm and dry.     Comments: See picture below, right proximal lateral aspect of upper leg with indented area with associated erythema and induration, mild tenderness to palpation, slight areas with clear fluid noted underneath skin  Neurological:     Mental Status: She is alert and oriented to person, place, and time.        UC Treatments / Results  Labs (all labs ordered are listed, but only abnormal results are displayed) Labs Reviewed - No data to display  EKG   Radiology No results found.  Procedures Procedures (including critical care time)  Medications Ordered in UC Medications - No data to display  Initial Impression / Assessment and Plan / UC Course  I have reviewed the triage vital signs and the nursing notes.  Pertinent labs & imaging results that were available during my care of the patient were reviewed by me and considered in my medical decision making (see chart for details).     Area of irregularity and induration with associated erythema and skin changes, questionable etiology of how this occurred, declines concern for fall/underlying bony abnormality, ambulating/mobility at baseline.  Will defer imaging at this time.  Opting to cover for infection with doxycycline, recommending warm compresses, gentle massage and Tylenol.  Monitor symptoms over the next week, patient to follow-up in 2 to 3 days if not seeing any improvement with the above.  Discussed strict return precautions. Patient verbalized understanding and is agreeable with plan.  Final Clinical Impressions(s) / UC Diagnoses   Final diagnoses:  Leg wound, left,  initial encounter     Discharge Instructions     Begin doxycycline twice daily for 10 days Warm compresses to help soften the area, gentle massage Tylenol as needed for  pain Follow-up if not improving or worsening    ED Prescriptions    Medication Sig Dispense Auth. Provider   doxycycline (VIBRAMYCIN) 100 MG capsule Take 1 capsule (100 mg total) by mouth 2 (two) times daily for 10 days. 20 capsule Zandon Talton, [place] C, PA-C     PDMP not reviewed this encounter.   Lew Dawes, [place] 08/06/20 [phone]

## 2020-08-06 NOTE — ED Triage Notes (Signed)
Per daughter pts adult daycare called in concerns of a wound to rt upper leg. Daughter states unsure what happen but noticed it over the weekend and put neosporin on it.

## 2020-08-10 ENCOUNTER — Ambulatory Visit: Payer: Self-pay

## 2020-08-30 ENCOUNTER — Other Ambulatory Visit: Payer: Self-pay

## 2020-08-30 ENCOUNTER — Encounter (HOSPITAL_COMMUNITY): Payer: Self-pay | Admitting: Emergency Medicine

## 2020-08-30 ENCOUNTER — Ambulatory Visit (HOSPITAL_COMMUNITY)
Admission: EM | Admit: 2020-08-30 | Discharge: 2020-08-30 | Disposition: A | Discharge status: Home / Self Care | Payer: MEDICARE | Attending: Student | Admitting: Student

## 2020-08-30 DIAGNOSIS — L89219 Pressure ulcer of right hip, unspecified stage: Secondary | ICD-10-CM | POA: Insufficient documentation

## 2020-08-30 MED ORDER — HIBICLENS 4 % EX LIQD: Freq: Every day | CUTANEOUS | 0 refills | Status: AC

## 2020-08-30 MED ORDER — CLINDAMYCIN HCL 150 MG PO CAPS: 150.0000 mg | ORAL_CAPSULE | Freq: Four times a day (QID) | ORAL | 0 refills | Status: DC

## 2020-08-30 NOTE — Discharge Instructions (Addendum)
-  Start the new antibiotic, clindamycin 1 pill every 6 hours while awake for 5 days. -Use the Hibiclens wound cleanser.  Apply this to the wound once daily, leave for 5 minutes, wash off.  It is best to do this when taking a bath or shower. -Wash the wound with gentle soap and water 1-2x daily, and then apply over-the-counter antibiotic ointment or vaseline. Cover with bandage.  -Call the wound clinic on Monday to schedule follow-up appointment with them. -If she develops new symptoms like fever/chills, new/increasing discharge from the wound, altered mental status, confusion-head straight to the emergency department.

## 2020-08-30 NOTE — ED Provider Notes (Signed)
[facility]    CSN: [id number] Arrival date & time: 08/30/20  1529      History   Chief Complaint Chief Complaint  Patient presents with  . Leg Pain    HPI [name] is a 80 y.o. female presenting with R leg wound x3 weeks.  Medical history of arthritis of shoulders, knees, hands, hips; status post left knee surgery, hyperlipidemia, hypertension, aortic atherosclerosis, Alzheimer's dementia. Here today with her daughter/caregiver. Was last seen at our urgent care for this ulcer 08/06/20. Today daughter states the wound is getting worse. States pt does like to lie in bed with her R hip pressed against her bed railing. They have been using dressings with nonstick dressing and cleaning daily. denies discharge from wound, fevers/chills.   HPI  Past Medical History:  Diagnosis Date  . Anemia    on iron  . Anxiety   . Arthritis    shoulders, knees, hands, hips; status post left knee surgery  . Chronic bronchitis (HCC)    CXR 07/08/16  . Dementia (HCC)   . Depression   . GERD (gastroesophageal reflux disease)    TAKES TUMS  . H/O: pneumonia 2003  . Hyperlipidemia    on med  . Hypertension   . Morbid obesity with BMI of 45.0-49.9, adult (HCC)   . Shingles    recent 02/2017  . Thoracic aortic atherosclerosis (HCC)    CXR 07/08/16    Patient Active Problem List   Diagnosis Date Noted  . Failure to thrive in adult 05/20/2020  . Venous insufficiency 05/20/2020  . Depression, major, single episode, in partial remission (HCC) 05/20/2020  . Late onset Alzheimer's dementia with behavioral disturbance (HCC) 05/20/2020  . History of fall 04/05/2020  . Chronic anemia 04/05/2020  . Bowel and bladder incontinence 04/05/2020  . AKI (acute kidney injury) (HCC) 02/26/2020  . Hypotension 02/26/2020  . Weakness   . Midline cystocele 05/03/2019  . OAB (overactive bladder) 05/03/2019  . Urinary retention 05/03/2019  . Uterine procidentia 05/03/2019  . Chest pain,  negative stress test. non cardiac. 06/16/2018  . Primary osteoarthritis of left hip 01/20/2017  . Primary osteoarthritis of right hip 11/10/2015  . Anxiety state 11/10/2015  . Preop cardiovascular exam 07/23/2014  . Bilateral edema of lower extremity 07/18/2013    Class: Chronic  . Metabolic syndrome 07/18/2013  . Rectocele 07/07/2012  . Essential hypertension 07/07/2012  . GERD (gastroesophageal reflux disease) 07/07/2012  . Hyperlipidemia with target LDL less than 100 07/07/2012  . Rotator cuff tear, left 03/02/2012  . Osteoarthritis of left shoulder 01/21/2012    Past Surgical History:  Procedure Laterality Date  . CARDIAC CATHETERIZATION  04/20/1996   patent coronaries, EF 74% (Dr. Aram CandelaW. Gamble)  . CARPAL TUNNEL RELEASE Left   . CATARACT EXTRACTION, BILATERAL    . COLONOSCOPY    . KNEE SURGERY Left 11/2001  . NM MYOVIEW LTD  06/17/2018   EF 60-65 %.  Apical thinning.  LOW RISK no ischemia or infarction.  . REPLACEMENT TOTAL KNEE Left 10/2009  . REPLACEMENT TOTAL KNEE Left 10/2009  . SHOULDER OPEN ROTATOR CUFF REPAIR  03/01/2012   Procedure: ROTATOR CUFF REPAIR SHOULDER OPEN;  Surgeon: Mable ParisJustin William Chandler, MD;  [facility];  Service: Orthopedics;  Laterality: Left;  SUBSCAPULARIS REPAIR  . SVD     x 7  . TOTAL HIP ARTHROPLASTY Right 07/24/2016  . TOTAL HIP ARTHROPLASTY Right 07/24/2016   Procedure: RIGHT TOTAL HIP ARTHROPLASTY ANTERIOR APPROACH;  Surgeon:  [name], MD;  Location: [facility];  Service: Orthopedics;  Laterality: Right;  . TOTAL HIP ARTHROPLASTY Left 04/09/2017   Procedure: LEFT TOTAL HIP ARTHROPLASTY ANTERIOR APPROACH;  Surgeon: [name], MD;  Location: [facility]S;  Service: Orthopedics;  Laterality: Left;  . TOTAL SHOULDER ARTHROPLASTY  01/18/2012   Procedure: TOTAL SHOULDER ARTHROPLASTY;  Surgeon: Mable Paris, MD;  Location: [facility];  Service: Orthopedics;  Laterality: Left;  . TOTAL SHOULDER REPLACEMENT Left 01/19/2012  . TRANSTHORACIC ECHOCARDIOGRAM   06/16/2018   EF 60 to 65%.  GR 1 DD.  Normal mitral valve.  Mild aortic sclerosis but no stenosis.  . TUBAL LIGATION    . WISDOM TOOTH EXTRACTION      OB History   No obstetric history on file.      Home Medications    Prior to Admission medications   Medication Sig Start Date End Date Taking? Authorizing Provider  chlorhexidine (HIBICLENS) 4 % external liquid Apply topically daily for 5 days. Apply to wound. Leave for 5 minutes. Wash off with warm water. 08/30/20 09/04/20 Yes Rhys Martini, PA-C  clindamycin (CLEOCIN) 150 MG capsule Take 1 capsule (150 mg total) by mouth every 6 (six) hours. 08/30/20  Yes Rhys Martini, PA-C  midodrine (PROAMATINE) 10 MG tablet TAKE 1 TABLET BY MOUTH TWICE A DAY WITH MEALS 07/15/20   Fargo, Amy E, NP  acetaminophen (TYLENOL) 500 MG tablet Take 500 mg by mouth every 6 (six) hours as needed.    [provider]  aspirin 81 MG chewable tablet Chew 81 mg by mouth daily.    [provider]  hydrocortisone (ANUSOL-HC) 2.5 % rectal cream Place 1 application rectally 2 (two) times daily. 05/25/20   Rushie Chestnut, PA-C  hydrOXYzine (ATARAX/VISTARIL) 25 MG tablet Take 1 tablet (25 mg total) by mouth 3 (three) times daily as needed. 06/10/20   Reed, Tiffany L, DO  mirtazapine (REMERON) 15 MG tablet Take 1 tablet (15 mg total) by mouth at bedtime. 06/10/20   Reed, Tiffany L, DO  Multiple Vitamins-Minerals (MULTI-VITAMIN GUMMIES PO) Take 1 tablet by mouth daily.     [provider]  polyethylene glycol powder (GLYCOLAX/MIRALAX) 17 GM/SCOOP powder Mix 1 capful of powder in drink and take by mouth one to three times daily as needed for daily soft stools OTC 04/19/20   Arby Barrette, MD  sennosides-docusate sodium (SENOKOT-S) 8.6-50 MG tablet Take 2 tablets by mouth daily. 04/19/20   Arby Barrette, MD  trimethoprim (TRIMPEX) 100 MG tablet Take 1 tablet (100 mg total) by mouth daily. 06/10/20   Kermit Balo, DO    Family History Family  History  Problem Relation Age of Onset  . Heart disease Mother   . Heart disease Father   . Heart disease Sister   . Hypertension Sister   . Heart disease Sister   . Hypertension Sister   . Heart disease Sister   . Hypertension Sister     Social History Social History   Tobacco Use  . Smoking status: Former Smoker    Packs/day: 1.00    Years: 25.00    Pack years: 25.00    Types: Cigarettes    Quit date: 03/07/1984    Years since quitting: 36.5  . Smokeless tobacco: Former Clinical biochemist  . Vaping Use: Never used  Substance Use Topics  . Alcohol use: No  . Drug use: No     Allergies   Effexor [venlafaxine hcl] and Sulfa drugs  cross reactors   Review of Systems Review of Systems  Skin: Positive for wound.  All other systems reviewed and are negative.    Physical Exam Triage Vital Signs ED Triage Vitals  Enc Vitals Group     BP      Pulse      Resp      Temp      Temp src      SpO2      Weight      Height      Head Circumference      Peak Flow      Pain Score      Pain Loc      Pain Edu?      Excl. in GC?    No data found.  Updated Vital Signs BP 122/87 (BP Location: Right Arm)   Pulse 73   Resp 16   SpO2 100%   Visual Acuity Right Eye Distance:   Left Eye Distance:   Bilateral Distance:    Right Eye Near:   Left Eye Near:    Bilateral Near:     Physical Exam Vitals reviewed.  Constitutional:      General: She is not in acute distress.    Appearance: Normal appearance. She is not ill-appearing or diaphoretic.  HENT:     Head: Normocephalic and atraumatic.  Cardiovascular:     Rate and Rhythm: Normal rate and regular rhythm.     Heart sounds: Normal heart sounds.  Pulmonary:     Effort: Pulmonary effort is normal.     Breath sounds: Normal breath sounds.  Skin:    General: Skin is warm.     Capillary Refill: Capillary refill takes less than 2 seconds.     Comments: (See image below) R proximal lateral upper leg with  indented area with erythema, induration. Mildly tender to palpation. Scant clear discharge on bandage but none able to be expressed. No warmth. No other lesions.  Neurological:     General: No focal deficit present.     Mental Status: She is alert and oriented to person, place, and time.  Psychiatric:        Mood and Affect: Mood normal.        Behavior: Behavior normal.        Thought Content: Thought content normal.        Judgment: Judgment normal.         UC Treatments / Results  Labs (all labs ordered are listed, but only abnormal results are displayed) Labs Reviewed  AEROBIC/ANAEROBIC CULTURE W GRAM STAIN (SURGICAL/DEEP WOUND)    EKG   Radiology No results found.  Procedures Procedures (including critical care time)  Medications Ordered in UC Medications - No data to display  Initial Impression / Assessment and Plan / UC Course  I have reviewed the triage vital signs and the nursing notes.  Pertinent labs & imaging results that were available during my care of the patient were reviewed by me and considered in my medical decision making (see chart for details).     This patient is a 80 year old female presenting with pressure wound. Today this pt is afebrile nontachycardic nontachypneic.  Wound culture sent.   Has already been treated with doxycycline 08/06/20, and history of AKI with elevated CR 06/17/20 so will avoid Bactrim. Plan to treat with Clindamycin as below. No history of c diff.   Make follow-up appointment with wound care center. Information provided, or have PCP send referral.  She does already have PCP follow-up scheduled for 5/26. ED return precautions discussed.   Coding this visit a Level 4 for review of past notes (07/2020), past labs (06/2020), ordering of tests (wound culture), and prescription drug management.  Final Clinical Impressions(s) / UC Diagnoses   Final diagnoses:  Pressure injury of skin of right hip, unspecified injury stage      Discharge Instructions     -Start the new antibiotic, clindamycin 1 pill every 6 hours while awake for 5 days. -Use the Hibiclens wound cleanser.  Apply this to the wound once daily, leave for 5 minutes, wash off.  It is best to do this when taking a bath or shower. -Wash the wound with gentle soap and water 1-2x daily, and then apply over-the-counter antibiotic ointment or vaseline. Cover with bandage.  -Call the wound clinic on Monday to schedule follow-up appointment with them. -If she develops new symptoms like fever/chills, new/increasing discharge from the wound, altered mental status, confusion-head straight to the emergency department.    ED Prescriptions    Medication Sig Dispense Auth. Provider   clindamycin (CLEOCIN) 150 MG capsule Take 1 capsule (150 mg total) by mouth every 6 (six) hours. 28 capsule Ignacia Bayley E, PA-C   chlorhexidine (HIBICLENS) 4 % external liquid Apply topically daily for 5 days. Apply to wound. Leave for 5 minutes. Wash off with warm water. 120 mL Rhys Martini, PA-C     PDMP not reviewed this encounter.   Rhys Martini, PA-C 08/30/20 1735

## 2020-08-30 NOTE — ED Triage Notes (Signed)
Pt presents with wound on right leg. Daughter states has notice left leg swelling.

## 2020-09-02 LAB — AEROBIC CULTURE W GRAM STAIN (SUPERFICIAL SPECIMEN)

## 2020-09-05 ENCOUNTER — Encounter: Payer: Self-pay | Admitting: Orthopedic Surgery

## 2020-09-05 ENCOUNTER — Other Ambulatory Visit: Payer: Self-pay

## 2020-09-05 ENCOUNTER — Telehealth (INDEPENDENT_AMBULATORY_CARE_PROVIDER_SITE_OTHER): Payer: MEDICARE | Admitting: Orthopedic Surgery

## 2020-09-05 ENCOUNTER — Ambulatory Visit: Payer: MEDICARE | Admitting: Internal Medicine

## 2020-09-05 DIAGNOSIS — K5901 Slow transit constipation: Secondary | ICD-10-CM

## 2020-09-05 DIAGNOSIS — R627 Adult failure to thrive: Secondary | ICD-10-CM

## 2020-09-05 DIAGNOSIS — G301 Alzheimer's disease with late onset: Secondary | ICD-10-CM | POA: Diagnosis not present

## 2020-09-05 DIAGNOSIS — F0281 Dementia in other diseases classified elsewhere with behavioral disturbance: Secondary | ICD-10-CM

## 2020-09-05 DIAGNOSIS — K219 Gastro-esophageal reflux disease without esophagitis: Secondary | ICD-10-CM | POA: Diagnosis not present

## 2020-09-05 DIAGNOSIS — I10 Essential (primary) hypertension: Secondary | ICD-10-CM

## 2020-09-05 DIAGNOSIS — F02818 Dementia in other diseases classified elsewhere, unspecified severity, with other behavioral disturbance: Secondary | ICD-10-CM

## 2020-09-05 DIAGNOSIS — L8946 Pressure-induced deep tissue damage of contiguous site of back, buttock and hip: Secondary | ICD-10-CM

## 2020-09-05 NOTE — Progress Notes (Signed)
This service is provided via telemedicine  No vital signs collected/recorded due to the encounter was a telemedicine visit.   Location of patient (ex: home, work):  Home  Patient consents to a telephone visit:  Yes, see encounter dated 09/05/2020  Location of the provider (ex: office, home): [facility]  Name of any referring provider:  N/A  Names of all persons participating in the telemedicine service and their role in the encounter:  [name], NP, Elveria Royals CMA, Patient and patient's daughter [name].  Time spent on call:  20 minutes with medical assistant

## 2020-09-05 NOTE — Progress Notes (Signed)
Careteam: Patient Care Team: [name] Heir, NP as PCP - General (Adult Health Nurse Practitioner) [name] Lex, MD as PCP - Cardiology (Cardiology) [name] Geralds, MD as Consulting Physician (Orthopedic Surgery) [name] Martinez, MD as Consulting Physician (Urology) [name] Clines, MD as Consulting Physician (Neurology)  Seen by: [name], AGNP-C  PLACE OF SERVICE:  [facility]  Advanced Directive information Does Patient Have a Medical Advance Directive?: Yes, Type of Advance Directive: Healthcare Power of Attorney, Does patient want to make changes to medical advance directive?: No - Patient declined  Allergies  Allergen Reactions  . Effexor [Venlafaxine Hcl] Swelling and Other (See Comments)    MD ADVISES PATIENT TO NOT TAKE IN FUTURE PT STOPPED, MD DC'd MED  . Sulfa Drugs Cross Reactors Itching    Chief Complaint  Patient presents with  . Medical Management of Chronic Issues    4 month follow up. Daughter would like to know if she has been bit by something.   . Quality Metric Gaps    Dexa scan     HPI: Patient is a 80 y.o. female seen today as virtual visit.   Daughter present during encounter. Patient continues to live with daughter due to Alzheimer's dementia.   05/18 she tested positive for covid. Symptoms included fever and mild productive cough. Sputum thin, does not know color.   05/20 she went to [facility] urgent care due to right hip pressure wound. She was prescribed clindamycin daily and hibiclens washes. Daughter does daily dressing changes. Reports wound appears to be healing since starting antibiotics. Denies wound odor, creamy drainage, fever or diarrhea. I cannot visualize wound due to inadequate lighting. Daughter believes she leans on her right side while in bed. She is working on getting a wedge pillow or changing the position of bed to reduce her leaning on right side.   Alzheimers- followed by Dr. Karel Jarvis with [facility] neurology. She was  prescribed Remeron 15 mg in Februaruy. Reports sleep and appetite have improved. Eating 3 meals daily. Cannot report recent weight.  Still ambulates with walker. Continues to attend [employer] during the day. No behavioral outbursts.   No recent falls. She slid off the chair the other day and landed on her buttocks. No injuries.   [facility] will check vitals weekly, she does not know this weeks. Daughter will also check blood pressure prn. Still taking midorine. No reports of SBP < 100.       Review of Systems:  Review of Systems  Constitutional: Negative for chills, fever, malaise/fatigue and weight loss.  HENT: Negative for hearing loss and sore throat.   Eyes: Negative for blurred vision and double vision.  Respiratory: Positive for cough and sputum production. Negative for shortness of breath and wheezing.   Cardiovascular: Negative for chest pain, palpitations and leg swelling.  Gastrointestinal: Negative for abdominal pain, constipation and nausea.  Genitourinary: Positive for frequency. Negative for dysuria.  Musculoskeletal: Positive for falls and myalgias.  Skin:       Right hip pressure wound.   Neurological: Positive for weakness. Negative for dizziness and headaches.  Psychiatric/Behavioral: Positive for memory loss. Negative for depression. The patient is not nervous/anxious.     Past Medical History:  Diagnosis Date  . Anemia    on iron  . Anxiety   . Arthritis    shoulders, knees, hands, hips; status post left knee surgery  . Chronic bronchitis (HCC)    CXR 07/08/16  . Dementia (HCC)   .  Depression   . GERD (gastroesophageal reflux disease)    TAKES TUMS  . H/O: pneumonia 2003  . Hyperlipidemia    on med  . Hypertension   . Morbid obesity with BMI of 45.0-49.9, adult (HCC)   . Shingles    recent 02/2017  . Thoracic aortic atherosclerosis (HCC)    CXR 07/08/16   Past Surgical History:  Procedure Laterality Date  . CARDIAC  CATHETERIZATION  04/20/1996   patent coronaries, EF 74% (Dr. Aram Candela)  . CARPAL TUNNEL RELEASE Left   . CATARACT EXTRACTION, BILATERAL    . COLONOSCOPY    . KNEE SURGERY Left 11/2001  . NM MYOVIEW LTD  06/17/2018   EF 60-65 %.  Apical thinning.  LOW RISK no ischemia or infarction.  . REPLACEMENT TOTAL KNEE Left 10/2009  . REPLACEMENT TOTAL KNEE Left 10/2009  . SHOULDER OPEN ROTATOR CUFF REPAIR  03/01/2012   Procedure: ROTATOR CUFF REPAIR SHOULDER OPEN;  Surgeon: Mable Paris, MD;  Location: [facility] OR;  Service: Orthopedics;  Laterality: Left;  SUBSCAPULARIS REPAIR  . SVD     x 7  . TOTAL HIP ARTHROPLASTY Right 07/24/2016  . TOTAL HIP ARTHROPLASTY Right 07/24/2016   Procedure: RIGHT TOTAL HIP ARTHROPLASTY ANTERIOR APPROACH;  Surgeon: [name] Geralds, MD;  Location: [facility] OR;  Service: Orthopedics;  Laterality: Right;  . TOTAL HIP ARTHROPLASTY Left 04/09/2017   Procedure: LEFT TOTAL HIP ARTHROPLASTY ANTERIOR APPROACH;  Surgeon: [name] Geralds, MD;  Location: [facility] ORS;  Service: Orthopedics;  Laterality: Left;  . TOTAL SHOULDER ARTHROPLASTY  01/18/2012   Procedure: TOTAL SHOULDER ARTHROPLASTY;  Surgeon: Mable Paris, MD;  Location: [facility] OR;  Service: Orthopedics;  Laterality: Left;  . TOTAL SHOULDER REPLACEMENT Left 01/19/2012  . TRANSTHORACIC ECHOCARDIOGRAM  06/16/2018   EF 60 to 65%.  GR 1 DD.  Normal mitral valve.  Mild aortic sclerosis but no stenosis.  . TUBAL LIGATION    . WISDOM TOOTH EXTRACTION     Social History:   reports that she quit smoking about 36 years ago. Her smoking use included cigarettes. She has a 25.00 pack-year smoking history. She has quit using smokeless tobacco. She reports that she does not drink alcohol and does not use drugs.  Family History  Problem Relation Age of Onset  . Heart disease Mother   . Heart disease Father   . Heart disease Sister   . Hypertension Sister   . Heart disease Sister   . Hypertension Sister   . Heart disease Sister   .  Hypertension Sister     Medications: Patient's Medications  New Prescriptions   No medications on file  Previous Medications   ACETAMINOPHEN (TYLENOL) 500 MG TABLET    Take 500 mg by mouth every 6 (six) hours as needed.   ASPIRIN 81 MG CHEWABLE TABLET    Chew 81 mg by mouth daily.   CLINDAMYCIN (CLEOCIN) 150 MG CAPSULE    Take 1 capsule (150 mg total) by mouth every 6 (six) hours.   HYDROCORTISONE (ANUSOL-HC) 2.5 % RECTAL CREAM    Place 1 application rectally 2 (two) times daily.   HYDROXYZINE (ATARAX/VISTARIL) 25 MG TABLET    Take 1 tablet (25 mg total) by mouth 3 (three) times daily as needed.   MIDODRINE (PROAMATINE) 10 MG TABLET    TAKE 1 TABLET BY MOUTH TWICE A DAY WITH MEALS   MIRTAZAPINE (REMERON) 15 MG TABLET    Take 1 tablet (15 mg total) by mouth at bedtime.   POLYETHYLENE  GLYCOL POWDER (GLYCOLAX/MIRALAX) 17 GM/SCOOP POWDER    Mix 1 capful of powder in drink and take by mouth one to three times daily as needed for daily soft stools OTC   SENNOSIDES-DOCUSATE SODIUM (SENOKOT-S) 8.6-50 MG TABLET    Take 2 tablets by mouth daily.   TRIMETHOPRIM (TRIMPEX) 100 MG TABLET    Take 1 tablet (100 mg total) by mouth daily.  Modified Medications   No medications on file  Discontinued Medications   MULTIPLE VITAMINS-MINERALS (MULTI-VITAMIN GUMMIES PO)    Take 1 tablet by mouth daily.     Physical Exam:  There were no vitals filed for this visit. There is no height or weight on file to calculate BMI. Wt Readings from Last 3 Encounters:  06/17/20 147 lb 14.9 oz (67.1 kg)  05/20/20 148 lb (67.1 kg)  04/19/20 149 lb 14.6 oz (68 kg)    Physical Exam Vitals reviewed. Nursing note reviewed: Virtual visit.  Constitutional:      Appearance: She is normal weight.  Pulmonary:     Comments: Dry cough observed during encounter Neurological:     General: No focal deficit present.     Mental Status: She is alert. Mental status is at baseline.  Psychiatric:        Mood and Affect: Mood  normal.        Behavior: Behavior normal.        Cognition and Memory: She exhibits impaired recent memory.     Labs reviewed: Basic Metabolic Panel: Recent Labs    02/26/20 1909 02/27/20 0316 02/28/20 0159 02/29/20 0103 04/04/20 1553 04/18/20 2057 06/17/20 1823  NA  --    < > 141   < > 144 136 138  K  --    < > 3.5   < > 4.9 3.8 5.0  CL  --    < > 114*   < > 106 101 103  CO2  --    < > 18*   < > 24 23 25   GLUCOSE  --    < > 88   < > 106* 124* 110*  BUN  --    < > 20   < > 15 19 23   CREATININE  --    < > 1.48*   < > 1.23* 1.37* 1.32*  CALCIUM  --    < > 8.9   < > 10.2 10.5* 9.8  MG  --   --  2.1  --   --   --   --   PHOS  --   --  2.1*  --   --   --   --   TSH 0.644  --   --   --   --   --   --    < > = values in this interval not displayed.   Liver Function Tests: Recent Labs    01/30/20 1531 02/26/20 1520 04/04/20 1553  AST 24 76* 23  ALT 10 42 8  ALKPHOS 60 63 67  BILITOT 0.5 0.8 0.3  PROT 7.7 7.1 7.2  ALBUMIN 4.3 3.7 4.3   No results for input(s): LIPASE, AMYLASE in the last 8760 hours. No results for input(s): AMMONIA in the last 8760 hours. CBC: Recent Labs    02/26/20 1520 02/28/20 0159 04/04/20 1553 04/18/20 2057 06/17/20 1823  WBC 10.1   < > 5.8 6.2 4.7  NEUTROABS 8.2*  --   --  3.3 2.5  HGB 10.4*   < >  10.0* 11.3* 11.1*  HCT 33.9*   < > 31.4* 35.6* 36.0  MCV 93.4   < > 94 98.3 98.4  PLT 249   < > 284 262 283   < > = values in this interval not displayed.   Lipid Panel: No results for input(s): CHOL, HDL, LDLCALC, TRIG, CHOLHDL, LDLDIRECT in the last 8760 hours. TSH: Recent Labs    02/26/20 1909  TSH 0.644   A1C: Lab Results  Component Value Date   HGBA1C 5.8 05/01/2012     Assessment/Plan 1. Late onset Alzheimer's dementia with behavioral disturbance (HCC) - no recent behavioral outbursts - appears Remeron has improved appetite and sleep patterns - ambulates with walker and feeds self - Cont [facility] [employer] M-F - cont  Remeron 15 mg qhs  2. Essential hypertension - unknown of recent bp, daughter states SBP> 100 - cont midodrine 10 mg bid with meals  3. Gastroesophageal reflux disease without esophagitis - stable without medicine - cont to avoid food triggers  4. Failure to thrive in adult - stable, continues to attend [employer] M-F - new non-healing wound to right hip   5. Pressure injury of deep tissue of contiguous region involving back and right hip - right hip pressure wound  - clindamycin 150 mg completed - daughter denies wound odor or purulent drainage - cont hibiclens and daily dressing changes daily - recommend frequent position changes  - recommend adding ensure or protein supplement to promote wound healing  6. Slow transit constipation - stable with daily senna and prn miralax - encourage daily hydration  Labs/tests: cbc/diff, cmp, A1c, discuss shingles vaccine and Dexa scan  Virtual Visit  I connected with [name] virtual visitand verified that I am speaking with the correct person using two identifiers.   Location: [facility] Patient: [name] Provider: Hazle NordmannAmy Lesean Woolverton NP   I discussed the limitations, risks, security and privacy concerns of performing an evaluation and management service by telephone and the availability of in person appointments. I also discussed with the patient that there may be a patient responsible charge related to this service. The patient expressed understanding and agreed to proceed.    I discussed the assessment and treatment plan with the patient. The patient was provided an opportunity to ask questions and all were answered. The patient agreed with the plan and demonstrated an understanding of the instructions.    The patient was advised to call back or seek an in-person evaluation if the symptoms worsen or if the condition fails to improve as anticipated.   I provided 30 minutes of non-face-to-face time during this  encounter.   Emile Ringgenberg Norval GableE. Larina Lieurance, AGNP-C Avs printed and mailed    Total Time: 30 minutes.  Next appt: 11/07/2020 [name] NordmannAmy Laylynn Campanella, Juel BurrowAGNP  [facility] [phone]

## 2020-09-06 ENCOUNTER — Ambulatory Visit: Payer: MEDICARE | Admitting: Orthopedic Surgery

## 2020-09-13 ENCOUNTER — Ambulatory Visit: Payer: MEDICARE | Admitting: Podiatry

## 2020-09-30 ENCOUNTER — Other Ambulatory Visit: Payer: Self-pay

## 2020-09-30 ENCOUNTER — Ambulatory Visit (HOSPITAL_COMMUNITY)
Admission: RE | Admit: 2020-09-30 | Discharge: 2020-09-30 | Disposition: A | Discharge status: Home / Self Care | Payer: MEDICARE | Source: Ambulatory Visit | Attending: Internal Medicine | Admitting: Internal Medicine

## 2020-09-30 ENCOUNTER — Other Ambulatory Visit (HOSPITAL_COMMUNITY): Payer: Self-pay | Admitting: Internal Medicine

## 2020-09-30 ENCOUNTER — Encounter (HOSPITAL_BASED_OUTPATIENT_CLINIC_OR_DEPARTMENT_OTHER): Payer: MEDICARE | Attending: Internal Medicine | Admitting: Internal Medicine

## 2020-09-30 DIAGNOSIS — Z87891 Personal history of nicotine dependence: Secondary | ICD-10-CM | POA: Diagnosis not present

## 2020-09-30 DIAGNOSIS — L89210 Pressure ulcer of right hip, unstageable: Secondary | ICD-10-CM

## 2020-09-30 DIAGNOSIS — G301 Alzheimer's disease with late onset: Secondary | ICD-10-CM | POA: Diagnosis not present

## 2020-09-30 NOTE — Progress Notes (Signed)
**Note [name]-Identified via Obfuscation** SHAE, HINNENKAMP ([id number]) Visit Report for 09/30/2020 Abuse/Suicide Risk Screen Details Patient Name: Date of Service: [name], [name]. 09/30/2020 7:30 A M Medical Record Number: [id number] Patient Account Number: [id number] Date of Birth/Sex: Treating RN: November 21, 1940 (80 y.o. Female) Antonieta Iba Primary Care Abraham Margulies: Langston Reusing, A MY Other Clinician: Referring Rudell Ortman: Treating Zarya Lasseigne/Extender: Geralyn Corwin FA RGO, A MY Weeks in Treatment: 0 Abuse/Suicide Risk Screen Items Answer ABUSE RISK SCREEN: Has anyone close to you tried to hurt or harm you recentlyo No Do you feel uncomfortable with anyone in your familyo No Has anyone forced you do things that you didnt want to doo No Electronic Signature(s) Signed: 09/30/2020 6:31:05 PM By: Antonieta Iba Entered By: Antonieta Iba on 09/30/2020 08:04:50 -------------------------------------------------------------------------------- Activities of Daily Living Details Patient Name: Date of Service: [name], [name] 09/30/2020 7:30 A M Medical Record Number: [id number] Patient Account Number: [id number] Date of Birth/Sex: Treating RN: 07/29/1940 (80 y.o. Female) Antonieta Iba Primary Care Quintasha Gren: Langston Reusing, A MY Other Clinician: Referring Geneieve Duell: Treating Aedin Jeansonne/Extender: Geralyn Corwin FA RGO, A MY Weeks in Treatment: 0 Activities of Daily Living Items Answer Activities of Daily Living (Please select one for each item) Drive Automobile Not Able T Medications ake Need Assistance Use T elephone Not Able Care for Appearance Need Assistance Use T oilet Need Assistance Bath / Shower Need Assistance Dress Self Need Assistance Feed Self Completely Able Walk Need Assistance Get In / Out Bed Need Assistance Housework Not Able Prepare Meals Not Able Handle Money Not Able Shop for Self Not Able Electronic Signature(s) Signed: 09/30/2020 6:31:05 PM By: Antonieta Iba Entered By: Antonieta Iba on 09/30/2020  08:06:20 -------------------------------------------------------------------------------- Education Screening Details Patient Name: Date of Service: [name], [name] 09/30/2020 7:30 A M Medical Record Number: [id number] Patient Account Number: [id number] Date of Birth/Sex: Treating RN: [birth year] (81 y.o. Female) Antonieta Iba Primary Care Adolf Ormiston: Langston Reusing, A MY Other Clinician: Referring Geisha Abernathy: Treating Jadae Steinke/Extender: Geralyn Corwin FA RGO, A MY Weeks in Treatment: 0 Primary Learner Assessed: Caregiver Reason Patient is not Primary Learner: Dementia Learning Preferences/Education Level/Primary Language Learning Preference: Explanation, Demonstration, Printed Material Preferred Language: Economist Language Barrier: No Translator Needed: No Memory Deficit: Yes Dementia Emotional Barrier: No Cultural/Religious Beliefs Affecting Medical Care: No Physical Barrier Impaired Vision: No Impaired Hearing: No Decreased Hand dexterity: No Knowledge/Comprehension Knowledge Level: Medium Comprehension Level: Medium Ability to understand written instructions: Medium Ability to understand verbal instructions: Medium Motivation Anxiety Level: Calm Cooperation: Cooperative Education Importance: Acknowledges Need Interest in Health Problems: Asks Questions Perception: Coherent Willingness to Engage in Self-Management High Activities: Readiness to Engage in Self-Management High Activities: Electronic Signature(s) Signed: 09/30/2020 6:31:05 PM By: Antonieta Iba Entered By: Antonieta Iba on 09/30/2020 08:07:23 -------------------------------------------------------------------------------- Fall Risk Assessment Details Patient Name: Date of Service: [name], [name] 09/30/2020 7:30 A M Medical Record Number: [id number] Patient Account Number: [id number] Date of Birth/Sex: Treating RN: 02-Feb-1941 (80 y.o. Female) Antonieta Iba Primary Care Sakib Noguez: FA RGO,  A MY Other Clinician: Referring Shirley Bolle: Treating Marlo Arriola/Extender: Geralyn Corwin FA RGO, A MY Weeks in Treatment: 0 Fall Risk Assessment Items Have you had 2 or more falls in the last 12 monthso 0 No Have you had any fall that resulted in injury in the last 12 monthso 0 No FALLS RISK SCREEN History of falling - immediate or within 3 months 0 No Secondary diagnosis (Do you have 2 or more medical diagnoseso) 0 No Ambulatory aid None/bed rest/wheelchair/nurse 0 No Crutches/cane/walker 15 Yes Furniture 0 No  Intravenous therapy Access/Saline/Heparin Lock 0 No Gait/Transferring Normal/ bed rest/ wheelchair 0 No Weak (short steps with or without shuffle, stooped but able to lift head while walking, may seek 10 Yes support from furniture) Impaired (short steps with shuffle, may have difficulty arising from chair, head down, impaired 0 No balance) Mental Status Oriented to own ability 0 No Electronic Signature(s) Signed: 09/30/2020 6:31:05 PM By: Antonieta Iba Entered By: Antonieta Iba on 09/30/2020 08:08:56 -------------------------------------------------------------------------------- Foot Assessment Details Patient Name: Date of Service: [name], [name] 09/30/2020 7:30 A M Medical Record Number: [id number] Patient Account Number: [id number] Date of Birth/Sex: Treating RN: 1940-06-25 (80 y.o. Female) Antonieta Iba Primary Care Tuana Hoheisel: FA RGO, A MY Other Clinician: Referring Merdis Snodgrass: Treating Rosalina Dingwall/Extender: Geralyn Corwin FA RGO, A MY Weeks in Treatment: 0 Foot Assessment Items Site Locations + = Sensation present, - = Sensation absent, C = Callus, U = Ulcer R = Redness, W = Warmth, M = Maceration, PU = Pre-ulcerative lesion F = Fissure, S = Swelling, D = Dryness Assessment Right: Left: Other Deformity: No No Prior Foot Ulcer: No No Prior Amputation: No No Charcot Joint: No No Ambulatory Status: Gait: Notes N/A Hip Wound Electronic  Signature(s) Signed: 09/30/2020 6:31:05 PM By: Antonieta Iba Entered By: Antonieta Iba on 09/30/2020 08:07:48 -------------------------------------------------------------------------------- Nutrition Risk Screening Details Patient Name: Date of Service: [name], [name]. 09/30/2020 7:30 A M Medical Record Number: [id number] Patient Account Number: [id number] Date of Birth/Sex: Treating RN: [birth year] (80 y.o. Female) Antonieta Iba Primary Care Elvenia Godden: Langston Reusing, A MY Other Clinician: Referring Loreta Blouch: Treating Medora Roorda/Extender: Geralyn Corwin FA RGO, A MY Weeks in Treatment: 0 Height (in): Weight (lbs): Body Mass Index (BMI): Nutrition Risk Screening Items Score Screening NUTRITION RISK SCREEN: I have an illness or condition that made me change the kind and/or amount of food I eat 0 No I eat fewer than two meals per day 0 No I eat few fruits and vegetables, or milk products 0 No I have three or more drinks of beer, liquor or wine almost every day 0 No I have tooth or mouth problems that make it hard for me to eat 0 No I don't always have enough money to buy the food I need 0 No I eat alone most of the time 0 No I take three or more different prescribed or over-the-counter drugs a day 1 Yes Without wanting to, I have lost or gained 10 pounds in the last six months 0 No I am not always physically able to shop, cook and/or feed myself 2 Yes Nutrition Protocols Good Risk Protocol Moderate Risk Protocol 0 Provide education on nutrition High Risk Proctocol Risk Level: Moderate Risk Score: 3 Electronic Signature(s) Signed: 09/30/2020 6:31:05 PM By: Antonieta Iba Entered By: Antonieta Iba on 09/30/2020 08:08:18

## 2020-10-01 NOTE — Progress Notes (Signed)
**Note [name]-Identified via Obfuscation** DAZANI, NORBY ([id number]) Visit Report for 09/30/2020 Chief Complaint Document Details Patient Name: Date of Service: [name], [name]. 09/30/2020 7:30 A M Medical Record Number: [id number] Patient Account Number: [id number] Date of Birth/Sex: Treating RN: 04/16/1940 (79 y.o. Female) Zandra Abts Primary Care Provider: FA RGO, A MY Other Clinician: Referring Provider: Treating Provider/Extender: Geralyn Corwin FA RGO, A MY Weeks in Treatment: 0 Information Obtained from: Patient Chief Complaint Right trochanter wound Electronic Signature(s) Signed: 09/30/2020 11:26:04 AM By: Geralyn Corwin DO Entered By: Geralyn Corwin on 09/30/2020 10:19:30 -------------------------------------------------------------------------------- Debridement Details Patient Name: Date of Service: [name], [name] 09/30/2020 7:30 A M Medical Record Number: [id number] Patient Account Number: [id number] Date of Birth/Sex: Treating RN: 1940-04-22 (80 y.o. Female) Antonieta Iba Primary Care Provider: Langston Reusing, A MY Other Clinician: Referring Provider: Treating Provider/Extender: Geralyn Corwin FA RGO, A MY Weeks in Treatment: 0 Debridement Performed for Assessment: Wound #1 Right Trochanter Performed By: Physician Geralyn Corwin, DO Debridement Type: Debridement Level of Consciousness (Pre-procedure): Awake and Alert Pre-procedure Verification/Time Out Yes - 08:47 Taken: Start Time: 08:48 Pain Control: Lidocaine 5% topical ointment T Area Debrided (L x W): otal 3 (cm) x 3 (cm) = 9 (cm) Tissue and other material debrided: Non-Viable, Eschar, Slough, Slough Level: Non-Viable Tissue Debridement Description: Selective/Open Wound Instrument: Blade Bleeding: None End Time: 08:53 Response to Treatment: Procedure was tolerated well Level of Consciousness (Post- Awake and Alert procedure): Post Debridement Measurements of Total Wound Length: (cm) 4 Stage: Unstageable/Unclassified Width: (cm)  3.8 Depth: (cm) 0.3 Volume: (cm) 3.581 Character of Wound/Ulcer Post Debridement: Stable Post Procedure Diagnosis Same as Pre-procedure Electronic Signature(s) Signed: 09/30/2020 11:26:04 AM By: Geralyn Corwin DO Signed: 09/30/2020 6:31:05 PM By: Antonieta Iba Entered By: Antonieta Iba on 09/30/2020 08:53:24 -------------------------------------------------------------------------------- HPI Details Patient Name: Date of Service: [name], [name] 09/30/2020 7:30 A M Medical Record Number: [id number] Patient Account Number: [id number] Date of Birth/Sex: Treating RN: 1940/11/16 (80 y.o. Female) Zandra Abts Primary Care Provider: FA RGO, A MY Other Clinician: Referring Provider: Treating Provider/Extender: Geralyn Corwin FA RGO, A MY Weeks in Treatment: 0 History of Present Illness HPI Description: Ms. Emalene Welte is a 80 year old female with a past medical history of dementia, hypertension that presents to the clinic for a 2-66-month history of right trochanter wound. Daughter is present and helps provide the history. Patient is not sure exactly how this wound occurred but was noticed one day by the daughter a couple of months ago. She has visited urgent care on 2 occasions for this issue. She has been treated with doxycycline and clindamycin. She did have a culture that showed Pseudomonas. She has not received any other antibiotics besides these two antibiotics. She does tend to sleep on her right side mostly. She is not very mobile and sits in a wheelchair most of the day. She reports mild pain to the area. She reports green drainage. She denies increased warmth, erythema or purulent drainage. She did experience a fall 3 months ago and visited the ED for this. She ended up having stitches to her scalp. Electronic Signature(s) Signed: 09/30/2020 11:26:04 AM By: Geralyn Corwin DO Entered By: Geralyn Corwin on 09/30/2020  10:27:47 -------------------------------------------------------------------------------- Physical Exam Details Patient Name: Date of Service: [name], [name] 09/30/2020 7:30 A M Medical Record Number: [id number] Patient Account Number: [id number] Date of Birth/Sex: Treating RN: 04/01/1941 (80 y.o. Female) Zandra Abts Primary Care Provider: Langston Reusing, A MY Other Clinician: Referring Provider: Treating Provider/Extender: Geralyn Corwin FA RGO, A MY Weeks  in Treatment: 0 Constitutional respirations regular, non-labored and within target range for patient.Marland Kitchen Psychiatric pleasant and cooperative. Notes Right lower extremity: Right trochanter has a open wound with eschar and nonviable tissue present. No obvious signs of infection. Right trochanter protrudes more on the right than the left. Electronic Signature(s) Signed: 09/30/2020 11:26:04 AM By: Geralyn Corwin DO Entered By: Geralyn Corwin on 09/30/2020 11:20:01 -------------------------------------------------------------------------------- Physician Orders Details Patient Name: Date of Service: [name], [name] 09/30/2020 7:30 A M Medical Record Number: [id number] Patient Account Number: [id number] Date of Birth/Sex: Treating RN: 1941/02/28 (80 y.o. Female) Antonieta Iba Primary Care Provider: Langston Reusing, A MY Other Clinician: Referring Provider: Treating Provider/Extender: Geralyn Corwin FA RGO, A MY Weeks in Treatment: 0 Verbal / Phone Orders: No Diagnosis Coding ICD-10 Coding Code Description L89.210 Pressure ulcer of right hip, unstageable G30.1 Alzheimer's disease with late onset Follow-up Appointments ppointment in 1 week. - with Dr. Mikey Bussing Return A Other: - [facility]=Supplies Bathing/ Shower/ Hygiene May shower and wash wound with soap and water. Off-Loading Other: - Keep off right hip when sleeping Additional Orders / Instructions Follow Nutritious Diet - Increase protein, 100-120g daily. Wound  Treatment Wound #1 - Trochanter Wound Laterality: Right Cleanser: Soap and Water 1 x Per Day/30 Days Discharge Instructions: May shower and wash wound with dial antibacterial soap and water prior to dressing change. Cleanser: Wound Cleanser 1 x Per Day/30 Days Discharge Instructions: Cleanse the wound with wound cleanser prior to applying a clean dressing using gauze sponges, not tissue or cotton balls. Peri-Wound Care: Skin Prep 1 x Per Day/30 Days Discharge Instructions: Use skin prep as directed Prim Dressing: Santyl Ointment 1 x Per Day/30 Days ary Discharge Instructions: Apply nickel thick amount to wound bed as instructed Secondary Dressing: Woven Gauze Sponge, Non-Sterile 4x4 in 1 x Per Day/30 Days Discharge Instructions: Apply saline moistened gauze over primary dressing. Secondary Dressing: Zetuvit Plus Silicone Border Dressing 5x5 (in/in) 1 x Per Day/30 Days Discharge Instructions: Apply silicone border over primary dressing as directed. Radiology X-ray, other: Right Hip Patient Medications llergies: Effexor A Notifications Medication Indication Start End 09/30/2020 Santyl DOSE 1 - topical 250 unit/gram ointment - 1 application daily 09/30/2020 ciprofloxacin HCl DOSE 1 - oral 250 mg tablet - 1 tablet every 12 hours for 7 days Electronic Signature(s) Signed: 09/30/2020 11:26:04 AM By: Geralyn Corwin DO Previous Signature: 09/30/2020 9:34:35 AM Version By: Geralyn Corwin DO Previous Signature: 09/30/2020 9:34:35 AM Version By: Geralyn Corwin DO Entered By: Geralyn Corwin on 09/30/2020 11:20:53 Prescription 09/30/2020 -------------------------------------------------------------------------------- Thurmond Butts, [name] Geralyn Corwin DO Patient Name: Provider: 21-Jul-1940 [id number] Date of Birth: NPI#: Female [id number] Sex: DEA #: [phone] Phone #: License #: Eligha Bridegroom [facility] Patient Address: [address]  [place], [place] [address] [address] [place], [place] [address] [phone] Allergies Effexor Provider's Orders X-ray, other: Right Hip Hand Signature: Date(s): Electronic Signature(s) Signed: 09/30/2020 11:26:04 AM By: Geralyn Corwin DO Entered By: Geralyn Corwin on 09/30/2020 11:20:53 -------------------------------------------------------------------------------- Problem List Details Patient Name: Date of Service: [name], [name] 09/30/2020 7:30 A M Medical Record Number: [id number] Patient Account Number: [id number] Date of Birth/Sex: Treating RN: 08/11/1940 (80 y.o. Female) Zandra Abts Primary Care Provider: FA RGO, A MY Other Clinician: Referring Provider: Treating Provider/Extender: Geralyn Corwin FA RGO, A MY Weeks in Treatment: 0 Active Problems ICD-10 Encounter Code Description Active Date MDM Diagnosis L89.210 Pressure ulcer of right hip, unstageable 09/30/2020 No Yes G30.1 Alzheimer's disease with late onset 09/30/2020  No Yes Inactive Problems Resolved Problems Electronic Signature(s) Signed: 09/30/2020 11:26:04 AM By: Geralyn CorwinHoffman, Kaelyn Innocent DO Entered By: Geralyn CorwinHoffman, Sujata Maines on 09/30/2020 10:17:13 -------------------------------------------------------------------------------- Progress Note Details Patient Name: Date of Service: [name], [name] 09/30/2020 7:30 A M Medical Record Number: [id number] Patient Account Number: [id number] Date of Birth/Sex: Treating RN: Sep 21, 1940 (80 y.o. Female) Zandra AbtsLynch, Shatara Primary Care Provider: FA RGO, A MY Other Clinician: Referring Provider: Treating Provider/Extender: Geralyn CorwinHoffman, Tallis Soledad FA RGO, A MY Weeks in Treatment: 0 Subjective Chief Complaint Information obtained from Patient Right trochanter wound History of Present Illness (HPI) Ms. Drusilla KannerBernice Mulnix is a 80 year old female with a past medical history of dementia, hypertension that presents to the clinic for a 2-543-month history of right trochanter wound.  Daughter is present and helps provide the history. Patient is not sure exactly how this wound occurred but was noticed one day by the daughter a couple of months ago. She has visited urgent care on 2 occasions for this issue. She has been treated with doxycycline and clindamycin. She did have a culture that showed Pseudomonas. She has not received any other antibiotics besides these two antibiotics. She does tend to sleep on her right side mostly. She is not very mobile and sits in a wheelchair most of the day. She reports mild pain to the area. She reports green drainage. She denies increased warmth, erythema or purulent drainage. She did experience a fall 3 months ago and visited the ED for this. She ended up having stitches to her scalp. Patient History Unable to Obtain Patient History due to Dementia. Allergies Effexor Family History Heart Disease - Mother,Father,Siblings, Hypertension - Mother,Father,Siblings, Lung Disease - Child, No family history of Cancer, Diabetes, Hereditary Spherocytosis, Kidney Disease, Seizures, Stroke, Thyroid Problems, Tuberculosis. Social History Former smoker, Marital Status - Widowed, Alcohol Use - Never, Drug Use - No History, Caffeine Use - Rarely. Medical History Eyes Patient has history of Cataracts - Cataract Surgery Hematologic/Lymphatic Patient has history of Anemia Respiratory Patient has history of Asthma Cardiovascular Patient has history of Hypertension, Peripheral Venous Disease Musculoskeletal Patient has history of Osteoarthritis Neurologic Patient has history of Dementia Medical A Surgical History Notes nd Gastrointestinal GERD Psychiatric Depression Review of Systems (ROS) Ear/Nose/Mouth/Throat Denies complaints or symptoms of Chronic sinus problems or rhinitis. Endocrine Denies complaints or symptoms of Heat/cold intolerance. Genitourinary Incontinent Integumentary (Skin) Complains or has symptoms of  Wounds. Musculoskeletal Complains or has symptoms of Muscle Weakness. Objective Constitutional respirations regular, non-labored and within target range for patient.. Vitals Time Taken: 7:54 AM, Temperature: 97.5 F, Pulse: 60 bpm, Respiratory Rate: 16 breaths/min, Blood Pressure: 136/84 mmHg. Psychiatric pleasant and cooperative. General Notes: Right lower extremity: Right trochanter has a open wound with eschar and nonviable tissue present. No obvious signs of infection. Right trochanter protrudes more on the right than the left. Integumentary (Hair, Skin) Wound #1 status is Open. Original cause of wound was Pressure Injury. The date acquired was: 07/26/2020. The wound is located on the Right Trochanter. The wound measures 4cm length x 3.8cm width x 0.3cm depth; 11.938cm^2 area and 3.581cm^3 volume. There is Fat Layer (Subcutaneous Tissue) exposed. There is no tunneling or undermining noted. There is a medium amount of serosanguineous drainage noted. The wound margin is well defined and not attached to the wound base. There is no granulation within the wound bed. There is a large (67-100%) amount of necrotic tissue within the wound bed including Eschar and Adherent Slough. Assessment Active Problems ICD-10 Pressure ulcer of right hip, unstageable Alzheimer's disease  with late onset Patient has a 2 to 41-month history of pressure ulcer to her right trochanter. On physical exam the right trochanter is more protruded than the left side. She did have a fall several months ago and I wonder if she dislocated this area. We will obtain a right hip x-ray For further evaluation. Her wound is currently unstageable with a large eschar and surrounding nonviable tissue. I crosshatched this and recommended Santyl daily. Also recommended aggressive offloading as she sleeps on the side. There are no obvious signs of infection however she did have a culture done in urgent care that showed  Pseudomonas Aeruginosa. It does not appear that she was started on any antibiotics for this. Because the daughter reports green drainage I will go ahead and cover for this with Cipro. 45 minutes was spent on the encounter including face-to-face patient time and reviewing medical records Procedures Wound #1 Pre-procedure diagnosis of Wound #1 is a Pressure Ulcer located on the Right Trochanter . There was a Selective/Open Wound Non-Viable Tissue Debridement with a total area of 9 sq cm performed by Geralyn Corwin, DO. With the following instrument(s): Blade to remove Non-Viable tissue/material. Material removed includes Eschar and Slough and after achieving pain control using Lidocaine 5% topical ointment. No specimens were taken. A time out was conducted at 08:47, prior to the start of the procedure. There was no bleeding. The procedure was tolerated well. Post Debridement Measurements: 4cm length x 3.8cm width x 0.3cm depth; 3.581cm^3 volume. Post debridement Stage noted as Unstageable/Unclassified. Character of Wound/Ulcer Post Debridement is stable. Post procedure Diagnosis Wound #1: Same as Pre-Procedure Plan Follow-up Appointments: Return Appointment in 1 week. - with Dr. Mikey Bussing Other: - [facility]=Supplies Bathing/ Shower/ Hygiene: May shower and wash wound with soap and water. Off-Loading: Other: - Keep off right hip when sleeping Additional Orders / Instructions: Follow Nutritious Diet - Increase protein, 100-120g daily. Radiology ordered were: X-ray, other: Right Hip The following medication(s) was prescribed: Santyl topical 250 unit/gram ointment 1 1 application daily starting 09/30/2020 ciprofloxacin HCl oral 250 mg tablet 1 1 tablet every 12 hours for 7 days starting 09/30/2020 WOUND #1: - Trochanter Wound Laterality: Right Cleanser: Soap and Water 1 x Per Day/30 Days Discharge Instructions: May shower and wash wound with dial antibacterial soap and water prior to dressing  change. Cleanser: Wound Cleanser 1 x Per Day/30 Days Discharge Instructions: Cleanse the wound with wound cleanser prior to applying a clean dressing using gauze sponges, not tissue or cotton balls. Peri-Wound Care: Skin Prep 1 x Per Day/30 Days Discharge Instructions: Use skin prep as directed Prim Dressing: Santyl Ointment 1 x Per Day/30 Days ary Discharge Instructions: Apply nickel thick amount to wound bed as instructed Secondary Dressing: Woven Gauze Sponge, Non-Sterile 4x4 in 1 x Per Day/30 Days Discharge Instructions: Apply saline moistened gauze over primary dressing. Secondary Dressing: Zetuvit Plus Silicone Border Dressing 5x5 (in/in) 1 x Per Day/30 Days Discharge Instructions: Apply silicone border over primary dressing as directed. 1. In office sharp debridement 2. Right hip x-ray 3. Santyl daily 4. Cipro for 1 week 5. Follow-up in 1 week Electronic Signature(s) Signed: 09/30/2020 11:26:04 AM By: Geralyn Corwin DO Entered By: Geralyn Corwin on 09/30/2020 11:25:21 -------------------------------------------------------------------------------- HxROS Details Patient Name: Date of Service: [name], [name] 09/30/2020 7:30 A M Medical Record Number: [id number] Patient Account Number: [id number] Date of Birth/Sex: Treating RN: October 25, 1940 (80 y.o. Female) Antonieta Iba Primary Care Provider: Langston Reusing, A MY Other Clinician: Referring Provider: Treating Provider/Extender: Mikey Bussing,  Braylen Staller FA RGO, A MY Weeks in Treatment: 0 Unable to Obtain Patient History due to Dementia Ear/Nose/Mouth/Throat Complaints and Symptoms: Negative for: Chronic sinus problems or rhinitis Endocrine Complaints and Symptoms: Negative for: Heat/cold intolerance Integumentary (Skin) Complaints and Symptoms: Positive for: Wounds Musculoskeletal Complaints and Symptoms: Positive for: Muscle Weakness Medical History: Positive for: Osteoarthritis Eyes Medical History: Positive for: Cataracts  - Cataract Surgery Hematologic/Lymphatic Medical History: Positive for: Anemia Respiratory Medical History: Positive for: Asthma Cardiovascular Medical History: Positive for: Hypertension; Peripheral Venous Disease Gastrointestinal Medical History: Past Medical History Notes: GERD Genitourinary Complaints and Symptoms: Review of System Notes: Incontinent Immunological Neurologic Medical History: Positive for: Dementia Oncologic Psychiatric Medical History: Past Medical History Notes: Depression HBO Extended History Items Eyes: Cataracts Immunizations Pneumococcal Vaccine: Received Pneumococcal Vaccination: No Implantable Devices None Family and Social History Cancer: No; Diabetes: No; Heart Disease: Yes - Mother,Father,Siblings; Hereditary Spherocytosis: No; Hypertension: Yes - Mother,Father,Siblings; Kidney Disease: No; Lung Disease: Yes - Child; Seizures: No; Stroke: No; Thyroid Problems: No; Tuberculosis: No; Former smoker; Marital Status - Widowed; Alcohol Use: Never; Drug Use: No History; Caffeine Use: Rarely; Financial Concerns: No; Food, Clothing or Shelter Needs: No; Support System Lacking: No; Transportation Concerns: No Electronic Signature(s) Signed: 09/30/2020 11:26:04 AM By: Geralyn Corwin DO Signed: 09/30/2020 6:31:05 PM By: Antonieta Iba Entered By: Antonieta Iba on 09/30/2020 08:04:44 -------------------------------------------------------------------------------- SuperBill Details Patient Name: Date of Service: [name], [name] 09/30/2020 Medical Record Number: [id number] Patient Account Number: [id number] Date of Birth/Sex: Treating RN: [birth year] (80 y.o. Female) Zandra Abts Primary Care Provider: FA RGO, A MY Other Clinician: Referring Provider: Treating Provider/Extender: Geralyn Corwin FA RGO, A MY Weeks in Treatment: 0 Diagnosis Coding ICD-10 Codes Code Description L89.210 Pressure ulcer of right hip, unstageable G30.1  Alzheimer's disease with late onset Facility Procedures CPT4 Code: [id number] Description: 99213 - WOUND CARE VISIT-LEV 3 EST PT Modifier: 25 Quantity: 1 CPT4 Code: [id number] Description: 97597 - DEBRIDE WOUND 1ST 20 SQ CM OR < ICD-10 Diagnosis Description L89.210 Pressure ulcer of right hip, unstageable G30.1 Alzheimer's disease with late onset Modifier: Quantity: 1 Physician Procedures : CPT4 Code Description Modifier [id number] - WC PHYS LEVEL 4 - NEW PT ICD-10 Diagnosis Description L89.210 Pressure ulcer of right hip, unstageable G30.1 Alzheimer's disease with late onset Quantity: 1 : [id number] 97597 - WC PHYS DEBR WO ANESTH 20 SQ CM ICD-10 Diagnosis Description L89.210 Pressure ulcer of right hip, unstageable G30.1 Alzheimer's disease with late onset Quantity: 1 Electronic Signature(s) Signed: 09/30/2020 12:17:23 PM By: Antonieta Iba Signed: 10/01/2020 5:18:50 PM By: Geralyn Corwin DO Previous Signature: 09/30/2020 11:26:04 AM Version By: Geralyn Corwin DO Entered By: Antonieta Iba on 09/30/2020 12:17:22

## 2020-10-03 NOTE — Progress Notes (Signed)
**Note [name]-Identified via Obfuscation** [name], [name] ([id number]) Visit Report for 09/30/2020 Allergy List Details Patient Name: Date of Service: [name], BECKERS. 09/30/2020 7:30 A M Medical Record Number: [id number] Patient Account Number: [id number] Date of Birth/Sex: Treating RN: 08-14-1940 (80 y.o. Female) Antonieta Iba Primary Care Jenasia Dolinar: [name], A MY Other Clinician: Referring Taim Wurm: Treating Yovana Scogin/Extender: Geralyn Corwin FA RGO, A MY Weeks in Treatment: 0 Allergies Active Allergies Effexor Allergy Notes Electronic Signature(s) Signed: 09/30/2020 6:31:05 PM By: Antonieta Iba Entered By: Antonieta Iba on 09/30/2020 07:56:58 -------------------------------------------------------------------------------- Arrival Information Details Patient Name: Date of Service: [name], [name] 09/30/2020 7:30 A M Medical Record Number: [id number] Patient Account Number: [id number] Date of Birth/Sex: Treating RN: 14-Aug-1940 (80 y.o. Female) Antonieta Iba Primary Care Melvine Julin: [name], A MY Other Clinician: Referring Kerie Badger: Treating Leandrea Ackley/Extender: Geralyn Corwin FA RGO, A MY Weeks in Treatment: 0 Visit Information Patient Arrived: Wheel Chair Arrival Time: 07:47 Accompanied By: Daughter Transfer Assistance: Manual Patient Identification Verified: Yes Secondary Verification Process Completed: Yes Patient Requires Transmission-Based Precautions: No Patient Has Alerts: Yes Patient Alerts: Patient on Blood Thinner Electronic Signature(s) Signed: 09/30/2020 6:31:05 PM By: Antonieta Iba Entered By: Antonieta Iba on 09/30/2020 07:53:07 -------------------------------------------------------------------------------- Clinic Level of Care Assessment Details Patient Name: Date of Service: [name], PENA. 09/30/2020 7:30 A M Medical Record Number: [id number] Patient Account Number: [id number] Date of Birth/Sex: Treating RN: March 08, 1941 (80 y.o. Female) Antonieta Iba Primary Care Kaidan Harpster: FA RGO, A MY Other  Clinician: Referring Chasya Zenz: Treating Vanette Noguchi/Extender: Geralyn Corwin FA RGO, A MY Weeks in Treatment: 0 Clinic Level of Care Assessment Items TOOL 2 Quantity Score X- 1 0 Use when only an EandM is performed on the INITIAL visit ASSESSMENTS - Nursing Assessment / Reassessment X- 1 20 General Physical Exam (combine w/ comprehensive assessment (listed just below) when performed on new pt. evals) X- 1 25 Comprehensive Assessment (HX, ROS, Risk Assessments, Wounds Hx, etc.) ASSESSMENTS - Wound and Skin A ssessment / Reassessment X - Simple Wound Assessment / Reassessment - one wound 1 5 []  - 0 Complex Wound Assessment / Reassessment - multiple wounds []  - 0 Dermatologic / Skin Assessment (not related to wound area) ASSESSMENTS - Ostomy and/or Continence Assessment and Care []  - 0 Incontinence Assessment and Management []  - 0 Ostomy Care Assessment and Management (repouching, etc.) PROCESS - Coordination of Care []  - 0 Simple Patient / Family Education for ongoing care []  - 0 Complex (extensive) Patient / Family Education for ongoing care X- 1 10 Staff obtains , Records, T Results / Process Orders est X- 1 10 Staff telephones HHA, Nursing Homes / Clarify orders / etc []  - 0 Routine Transfer to another Facility (non-emergent condition) []  - 0 Routine Hospital Admission (non-emergent condition) []  - 0 New Admissions / / Ordering NPWT Apligraf, etc. , []  - 0 Emergency Hospital Admission (emergent condition) []  - 0 Simple Discharge Coordination []  - 0 Complex (extensive) Discharge Coordination PROCESS - Special Needs []  - 0 Pediatric / Minor Patient Management []  - 0 Isolation Patient Management []  - 0 Hearing / Language / Visual special needs []  - 0 Assessment of Community assistance (transportation, D/C planning, etc.) []  - 0 Additional assistance / Altered mentation X- 1 15 Support Surface(s) Assessment (bed, cushion, seat,  etc.) INTERVENTIONS - Wound Cleansing / Measurement X- 1 5 Wound Imaging (photographs - any number of wounds) []  - 0 Wound Tracing (instead of photographs) X- 1 5 Simple Wound Measurement - one wound []  - 0 Complex Wound Measurement -  multiple wounds X- 1 5 Simple Wound Cleansing - one wound []  - 0 Complex Wound Cleansing - multiple wounds INTERVENTIONS - Wound Dressings []  - 0 Small Wound Dressing one or multiple wounds X- 1 15 Medium Wound Dressing one or multiple wounds []  - 0 Large Wound Dressing one or multiple wounds []  - 0 Application of Medications - injection INTERVENTIONS - Miscellaneous []  - 0 External ear exam []  - 0 Specimen Collection (cultures, biopsies, blood, body fluids, etc.) []  - 0 Specimen(s) / Culture(s) sent or taken to Lab for analysis []  - 0 Patient Transfer (multiple staff / / Similar devices) []  - 0 Simple Staple / Suture removal (25 or less) []  - 0 Complex Staple / Suture removal (26 or more) []  - 0 Hypo / Hyperglycemic Management (close monitor of Blood Glucose) []  - 0 Ankle / Brachial Index (ABI) - do not check if billed separately Has the patient been seen at the hospital within the last three years: Yes Total Score: 115 Level Of Care: New/Established - Level 3 Electronic Signature(s) Signed: 09/30/2020 6:31:05 PM By: Entered By: on 09/30/2020 12:17:06 -------------------------------------------------------------------------------- Encounter Discharge Information Details Patient Name: Date of Service: , [name] 09/30/2020 7:30 A M Medical Record Number: Patient Account Number: Nurse, adult Date of Birth/Sex: Treating RN: 05-Apr-1941 (80 y.o. Female) Primary Care Jamani Bearce: , A MY Other Clinician: Referring Shavanna Furnari: Treating Azyria Osmon/Extender: 10/02/2020 FA RGO, A MY Weeks in Treatment: 0 Encounter Discharge Information Items Post Procedure  Vitals Discharge Condition: Stable Temperature (F): 97.5 Ambulatory Status: Wheelchair Pulse (bpm): 60 Discharge Destination: Home Respiratory Rate (breaths/min): 16 Transportation: Private Auto Blood Pressure (mmHg): 136/84 Accompanied By: Daughter Schedule Follow-up Appointment: Yes Clinical Summary of Care: Provided on 09/30/2020 Form Type Recipient Paper Patient Patient Electronic Signature(s) Signed: 09/30/2020 12:19:04 PM By: 10/02/2020 Entered By: [name] on 09/30/2020 12:19:03 -------------------------------------------------------------------------------- Lower Extremity Assessment Details Patient Name: Date of Service: [name], [id number]. 09/30/2020 7:30 A M Medical Record Number: [id number] Patient Account Number: Antonieta Iba Date of Birth/Sex: Treating RN: 04/14/1940 (80 y.o. Female) 10/02/2020 Primary Care Lakin Rhine: 10/02/2020, A MY Other Clinician: Referring Kennon Encinas: Treating Montie Swiderski/Extender: Antonieta Iba FA RGO, A MY Weeks in Treatment: 0 Edema Assessment Assessed: [Left: Yes] [Right: Yes] Edema: [Left: No] [Right: No] Vascular Assessment Pulses: Dorsalis Pedis Palpable: [Left:Yes] [Right:Yes] Electronic Signature(s) Signed: 09/30/2020 6:31:05 PM By: 10/02/2020 Entered By: [name] on 09/30/2020 08:17:11 -------------------------------------------------------------------------------- Multi Wound Chart Details Patient Name: Date of Service: 10/02/2020, Nashalie W. 09/30/2020 7:30 A M Medical Record Number: [id number] Patient Account Number: 09/05/1940 Date of Birth/Sex: Treating RN: [birth year] (80 y.o. Female) [name] Primary Care Ita Fritzsche: FA RGO, A MY Other Clinician: Referring Kambri Dismore: Treating Delaina Fetsch/Extender: Geralyn Corwin FA RGO, A MY Weeks in Treatment: 0 Vital Signs Height(in): Pulse(bpm): 60 Weight(lbs): Blood Pressure(mmHg): 136/84 Body Mass Index(BMI): Temperature(F): 97.5 Respiratory Rate(breaths/min):  16 Photos: [1:No Photos Right Trochanter] [N/A:N/A N/A] Wound Location: [1:Pressure Injury] [N/A:N/A] Wounding Event: [1:Pressure Ulcer] [N/A:N/A] Primary Etiology: [1:Cataracts, Anemia, Asthma,] [N/A:N/A] Comorbid History: [1:Hypertension, Peripheral Venous Disease, Osteoarthritis, Dementia 07/26/2020] [N/A:N/A] Date Acquired: [1:0] [N/A:N/A] Weeks of Treatment: [1:Open] [N/A:N/A] Wound Status: [1:4x3.8x0.3] [N/A:N/A] Measurements L x W x D (cm) [1:11.938] [N/A:N/A] A (cm) : rea [1:3.581] [N/A:N/A] Volume (cm) : [1:0.00%] [N/A:N/A] % Reduction in A [1:rea: 0.00%] [N/A:N/A] % Reduction in Volume: [1:Unstageable/Unclassified] [N/A:N/A] Classification: [1:Medium] [N/A:N/A] Exudate A mount: [1:Serosanguineous] [N/A:N/A] Exudate Type: [1:red, brown] [N/A:N/A] Exudate Color: [1:Well defined, not attached] [N/A:N/A]  Wound Margin: [1:None Present (0%)] [N/A:N/A] Granulation A mount: [1:Large (67-100%)] [N/A:N/A] Necrotic A mount: [1:Eschar, Adherent Slough] [N/A:N/A] Necrotic Tissue: [1:Fat Layer (Subcutaneous Tissue): Yes N/A] Exposed Structures: [1:Fascia: No Tendon: No Muscle: No Joint: No Bone: No None] [N/A:N/A] Epithelialization: [1:Debridement - Selective/Open Wound N/A] Debridement: Pre-procedure Verification/Time Out 08:47 [N/A:N/A] Taken: [1:Lidocaine 5% topical ointment] [N/A:N/A] Pain Control: [1:Necrotic/Eschar, Slough] [N/A:N/A] Tissue Debrided: [1:Non-Viable Tissue] [N/A:N/A] Level: [1:9] [N/A:N/A] Debridement A (sq cm): [1:rea Blade] [N/A:N/A] Instrument: [1:None] [N/A:N/A] Bleeding: [1:Procedure was tolerated well] [N/A:N/A] Debridement Treatment Response: [1:4x3.8x0.3] [N/A:N/A] Post Debridement Measurements L x W x D (cm) [1:3.581] [N/A:N/A] Post Debridement Volume: (cm) [1:Unstageable/Unclassified] [N/A:N/A] Post Debridement Stage: [1:Debridement] [N/A:N/A] Treatment Notes Wound #1 (Trochanter) Wound Laterality: Right Cleanser Soap and Water Discharge  Instruction: May shower and wash wound with dial antibacterial soap and water prior to dressing change. Wound Cleanser Discharge Instruction: Cleanse the wound with wound cleanser prior to applying a clean dressing using gauze sponges, not tissue or cotton balls. Peri-Wound Care Skin Prep Discharge Instruction: Use skin prep as directed Topical Primary Dressing Santyl Ointment Discharge Instruction: Apply nickel thick amount to wound bed as instructed Secondary Dressing Woven Gauze Sponge, Non-Sterile 4x4 in Discharge Instruction: Apply saline moistened gauze over primary dressing. Zetuvit Plus Silicone Border Dressing 5x5 (in/in) Discharge Instruction: Apply silicone border over primary dressing as directed. Secured With Compression Wrap Compression Stockings Facilities manager) Signed: 09/30/2020 12:24:33 PM By: Antonieta Iba Entered By: Antonieta Iba on 09/30/2020 12:24:33 -------------------------------------------------------------------------------- Multi-Disciplinary Care Plan Details Patient Name: Date of Service: [name], Alaura W. 09/30/2020 7:30 A M Medical Record Number: [id number] Patient Account Number: [id number] Date of Birth/Sex: Treating RN: April 03, 1941 (80 y.o. Female) Antonieta Iba Primary Care Aniesha Haughn: [name], A MY Other Clinician: Referring Nabeel Gladson: Treating Deryck Hippler/Extender: Geralyn Corwin FA RGO, A MY Weeks in Treatment: 0 Active Inactive Orientation to the Wound Care Program Nursing Diagnoses: Knowledge deficit related to the wound healing center program Goals: Patient/caregiver will verbalize understanding of the Wound Healing Center Program Date Initiated: 09/30/2020 Target Resolution Date: 10/28/2020 Goal Status: Active Interventions: Provide education on orientation to the wound center Notes: Pressure Nursing Diagnoses: Knowledge deficit related to causes and risk factors for pressure ulcer development Knowledge deficit  related to management of pressures ulcers Goals: Patient will remain free from development of additional pressure ulcers Date Initiated: 09/30/2020 Target Resolution Date: 11/04/2020 Goal Status: Active Patient/caregiver will verbalize understanding of pressure ulcer management Date Initiated: 09/30/2020 Target Resolution Date: 11/04/2020 Goal Status: Active Interventions: Assess: immobility, friction, shearing, incontinence upon admission and as needed Assess offloading mechanisms upon admission and as needed Assess potential for pressure ulcer upon admission and as needed Provide education on pressure ulcers Notes: Wound/Skin Impairment Nursing Diagnoses: Impaired tissue integrity Goals: Patient/caregiver will verbalize understanding of skin care regimen Date Initiated: 09/30/2020 Target Resolution Date: 10/28/2020 Goal Status: Active Ulcer/skin breakdown will have a volume reduction of 30% by week 4 Date Initiated: 09/30/2020 Target Resolution Date: 10/28/2020 Goal Status: Active Interventions: Assess patient/caregiver ability to obtain necessary supplies Assess patient/caregiver ability to perform ulcer/skin care regimen upon admission and as needed Assess ulceration(s) every visit Provide education on ulcer and skin care Treatment Activities: Skin care regimen initiated : 09/30/2020 Topical wound management initiated : 09/30/2020 Notes: Electronic Signature(s) Signed: 09/30/2020 6:31:05 PM By: Antonieta Iba Entered By: Antonieta Iba on 09/30/2020 08:43:10 -------------------------------------------------------------------------------- Pain Assessment Details Patient Name: Date of Service: [name], [name]. 09/30/2020 7:30 A M Medical Record Number: [id number] Patient Account Number: [id number] Date of Birth/Sex:  Treating RN: 21-Jun-1940 (80 y.o. Female) Antonieta IbaBarnhart, Jodi Primary Care Jocilynn Grade: [name] ReusingFA RGO, A MY Other Clinician: Referring Justis Closser: Treating Angad Nabers/Extender:  Geralyn CorwinHoffman, Jessica FA RGO, A MY Weeks in Treatment: 0 Active Problems Location of Pain Severity and Description of Pain Patient Has Paino No Site Locations Pain Management and Medication Current Pain Management: Electronic Signature(s) Signed: 09/30/2020 6:31:05 PM By: Antonieta IbaBarnhart, Jodi Entered By: Antonieta IbaBarnhart, Jodi on 09/30/2020 08:17:29 -------------------------------------------------------------------------------- Patient/Caregiver Education Details Patient Name: Date of Service: [name] PersonWA [name], [name] MaeBERNICE W. 6/20/2022andnbsp7:30 A M Medical Record Number: [id number] Patient Account Number: [id number] Date of Birth/Gender: Treating RN: 21-Jun-1940 (80 y.o. Female) Antonieta IbaBarnhart, Jodi Primary Care Physician: [name] ReusingFA RGO, A MY Other Clinician: Referring Physician: Treating Physician/Extender: Geralyn CorwinHoffman, Jessica FA RGO, A MY Weeks in Treatment: 0 Education Assessment Education Provided To: Caregiver Education Topics Provided Pressure: Methods: Explain/Verbal, Printed Responses: State content correctly Welcome T [facility]: o Handouts: Welcome T [facility] o Methods: Explain/Verbal, Printed Responses: State content correctly Wound/Skin Impairment: Methods: Explain/Verbal, Printed Responses: State content correctly Electronic Signature(s) Signed: 09/30/2020 6:31:05 PM By: Antonieta IbaBarnhart, Jodi Entered By: Antonieta IbaBarnhart, Jodi on 09/30/2020 08:44:19 -------------------------------------------------------------------------------- Wound Assessment Details Patient Name: Date of Service: [name] PersonWA [name], [name] 09/30/2020 7:30 A M Medical Record Number: [id number] Patient Account Number: [id number] Date of Birth/Sex: Treating RN: 21-Jun-1940 (80 y.o. Female) Antonieta IbaBarnhart, Jodi Primary Care Brewster Wolters: FA RGO, A MY Other Clinician: Referring Mel Langan: Treating Aayansh Codispoti/Extender: Geralyn CorwinHoffman, Jessica FA RGO, A MY Weeks in Treatment: 0 Wound Status Wound Number: 1 Primary Pressure Ulcer Etiology: Wound  Location: Right Trochanter Wound Open Wounding Event: Pressure Injury Status: Date Acquired: 07/26/2020 Comorbid Cataracts, Anemia, Asthma, Hypertension, Peripheral Venous Weeks Of Treatment: 0 History: Disease, Osteoarthritis, Dementia Clustered Wound: No Photos Wound Measurements Length: (cm) 4 Width: (cm) 3.8 Depth: (cm) 0.3 Area: (cm) 11.938 Volume: (cm) 3.581 % Reduction in Area: 0% % Reduction in Volume: 0% Epithelialization: None Tunneling: No Undermining: No Wound Description Classification: Unstageable/Unclassified Wound Margin: Well defined, not attached Exudate Amount: Medium Exudate Type: Serosanguineous Exudate Color: red, brown Foul Odor After Cleansing: No Slough/Fibrino Yes Wound Bed Granulation Amount: None Present (0%) Exposed Structure Necrotic Amount: Large (67-100%) Fascia Exposed: No Necrotic Quality: Eschar, Adherent Slough Fat Layer (Subcutaneous Tissue) Exposed: Yes Tendon Exposed: No Muscle Exposed: No Joint Exposed: No Bone Exposed: No Treatment Notes Wound #1 (Trochanter) Wound Laterality: Right Cleanser Soap and Water Discharge Instruction: May shower and wash wound with dial antibacterial soap and water prior to dressing change. Wound Cleanser Discharge Instruction: Cleanse the wound with wound cleanser prior to applying a clean dressing using gauze sponges, not tissue or cotton balls. Peri-Wound Care Skin Prep Discharge Instruction: Use skin prep as directed Topical Primary Dressing Santyl Ointment Discharge Instruction: Apply nickel thick amount to wound bed as instructed Secondary Dressing Woven Gauze Sponge, Non-Sterile 4x4 in Discharge Instruction: Apply saline moistened gauze over primary dressing. Zetuvit Plus Silicone Border Dressing 5x5 (in/in) Discharge Instruction: Apply silicone border over primary dressing as directed. Secured With Compression Wrap Compression Stockings Facilities managerAdd-Ons Electronic Signature(s) Signed:  09/30/2020 6:31:05 PM By: Antonieta IbaBarnhart, Jodi Signed: 10/03/2020 8:38:46 AM By: Rolan LipaPriddy, Jennifer Entered By: Rolan LipaPriddy, Jennifer on 09/30/2020 15:05:41 -------------------------------------------------------------------------------- Vitals Details Patient Name: Date of Service: [name] PersonWA [name], [name] 09/30/2020 7:30 A M Medical Record Number: [id number] Patient Account Number: [id number] Date of Birth/Sex: Treating RN: 21-Jun-1940 (80 y.o. Female) Antonieta IbaBarnhart, Jodi Primary Care Tenna Lacko: [name] ReusingFA RGO, A MY Other Clinician: Referring Zania Kalisz: Treating Camauri Fleece/Extender: Geralyn CorwinHoffman, Jessica FA RGO, A MY Weeks in Treatment: 0 Vital Signs Time  Taken: 07:54 Temperature (F): 97.5 Pulse (bpm): 60 Respiratory Rate (breaths/min): 16 Blood Pressure (mmHg): 136/84 Reference Range: 80 - 120 mg / dl Electronic Signature(s) Signed: 09/30/2020 6:31:05 PM By: Antonieta Iba Entered By: Antonieta Iba on 09/30/2020 07:56:47

## 2020-10-07 ENCOUNTER — Other Ambulatory Visit: Payer: Self-pay

## 2020-10-07 ENCOUNTER — Encounter (HOSPITAL_BASED_OUTPATIENT_CLINIC_OR_DEPARTMENT_OTHER): Payer: MEDICARE | Admitting: Internal Medicine

## 2020-10-07 DIAGNOSIS — L89210 Pressure ulcer of right hip, unstageable: Secondary | ICD-10-CM

## 2020-10-07 NOTE — Progress Notes (Addendum)
**Note [name]-Identified via Obfuscation** [name] BankerWADE, [name] ([id number]) Visit Report for 10/07/2020 Chief Complaint Document Details Patient Name: Date of Service: [name], [name] 10/07/2020 8:00 A M Medical Record Number: [id number] Patient Account Number: [id number] Date of Birth/Sex: Treating RN: [birth year] (80 y.o. [name]) Barnhart, Jodi Primary Care Provider: Hazle NordmannFargo, Amy Other Clinician: Referring Provider: Treating Provider/Extender: Loanne DrillingHoffman, Maddisen Vought [place], Amy Weeks in Treatment: 1 Information Obtained from: Patient Chief Complaint Right trochanter wound Electronic Signature(s) Signed: 10/07/2020 11:32:06 AM By: Geralyn CorwinHoffman, Leeam Cedrone DO Entered By: Geralyn CorwinHoffman, Oreoluwa Gilmer on 10/07/2020 11:26:06 -------------------------------------------------------------------------------- Debridement Details Patient Name: Date of Service: [name] PersonWA [name], [name] 10/07/2020 8:00 A M Medical Record Number: [id number] Patient Account Number: [id number] Date of Birth/Sex: Treating RN: [birth year] (80 y.o. [name]) Barnhart, Jodi Primary Care Provider: Hazle NordmannFargo, Amy Other Clinician: Referring Provider: Treating Provider/Extender: Loanne DrillingHoffman, Yassin Scales [place], Amy Weeks in Treatment: 1 Debridement Performed for Assessment: Wound #1 Right Trochanter Performed By: Physician Geralyn CorwinHoffman, Theran Vandergrift, DO Debridement Type: Debridement Level of Consciousness (Pre-procedure): Awake and Alert Pre-procedure Verification/Time Out Yes - 08:35 Taken: Start Time: 08:36 T Area Debrided (L x W): otal 3 (cm) x 3 (cm) = 9 (cm) Tissue and other material debrided: Non-Viable, Eschar Level: Non-Viable Tissue Debridement Description: Selective/Open Wound Instrument: Blade, Forceps, Scissors Bleeding: Minimum Hemostasis Achieved: Pressure Response to Treatment: Procedure was tolerated well Level of Consciousness (Post- Awake and Alert procedure): Post Debridement Measurements of Total Wound Length: (cm) 4.1 Stage: Unstageable/Unclassified Width: (cm) 4.3 Depth: (cm) 1 Volume: (cm)  13.847 Character of Wound/Ulcer Post Debridement: Stable Post Procedure Diagnosis Same as Pre-procedure Electronic Signature(s) Signed: 10/07/2020 11:32:06 AM By: Geralyn CorwinHoffman, Opaline Reyburn DO Signed: 10/07/2020 5:08:20 PM By: Antonieta IbaBarnhart, Jodi Entered By: Antonieta IbaBarnhart, Jodi on 10/07/2020 08:41:22 -------------------------------------------------------------------------------- HPI Details Patient Name: Date of Service: [name], [name] 10/07/2020 8:00 A M Medical Record Number: [id number] Patient Account Number: [id number] Date of Birth/Sex: Treating RN: [birth year] (80 y.o. [name]) Barnhart, Jodi Primary Care Provider: Hazle NordmannFargo, Amy Other Clinician: Referring Provider: Treating Provider/Extender: Loanne DrillingHoffman, Americus Perkey [place], Amy Weeks in Treatment: 1 History of Present Illness HPI Description: Admission 6/20 Ms. [name] KannerBernice Frumkin is a 80 year old female with a past medical history of dementia, hypertension that presents to the clinic for a 2-10581-month history of right trochanter wound. Daughter is present and helps provide the history. Patient is not sure exactly how this wound occurred but was noticed one day by the daughter a couple of months ago. She has visited urgent care on 2 occasions for this issue. She has been treated with doxycycline and clindamycin. She did have a culture that showed Pseudomonas. She has not received any other antibiotics besides these two antibiotics. She does tend to sleep on her right side mostly. She is not very mobile and sits in a wheelchair most of the day. She reports mild pain to the area. She reports green drainage. She denies increased warmth, erythema or purulent drainage. She did experience a fall 3 months ago and visited the ED for this. She ended up having stitches to her scalp. 6/27; patient presents for 1 week follow-up. She is present with her daughter who helps provide the history. She started using Santyl daily with dressing changes. She is also aggressively offloading the area.  She overall feels well and has no complaints today. She denies signs of infection. Electronic Signature(s) Signed: 10/07/2020 11:32:06 AM By: Geralyn CorwinHoffman, Khyran Riera DO Entered By: Geralyn CorwinHoffman, Kerin Kren on 10/07/2020 11:28:15 -------------------------------------------------------------------------------- Physical Exam Details Patient Name: Date of Service: [name] PersonWA [name], [name] 10/07/2020 8:00 A M Medical Record Number: [id number] Patient Account Number: [id number]  Date of Birth/Sex: Treating RN: [birth year] (80 y.o. [name] Primary Care Provider: [name] Other Clinician: Referring Provider: Treating Provider/Extender: Loanne Drilling, Amy Weeks in Treatment: 1 Constitutional respirations regular, non-labored and within target range for patient.Marland Kitchen Psychiatric pleasant and cooperative. Notes Right lower extremity: Right trochanter has a open wound with eschar and nonviable tissue present. No obvious signs of infection. Electronic Signature(s) Signed: 10/07/2020 11:32:06 AM By: Geralyn Corwin DO Entered By: Geralyn Corwin on 10/07/2020 11:28:49 -------------------------------------------------------------------------------- Physician Orders Details Patient Name: Date of Service: [name], [name] 10/07/2020 8:00 A M Medical Record Number: [id number] Patient Account Number: [id number] Date of Birth/Sex: Treating RN: 1940-08-30 (80 y.o. [name] Primary Care Provider: [name] Other Clinician: Referring Provider: Treating Provider/Extender: Loanne Drilling, Amy Weeks in Treatment: 1 Verbal / Phone Orders: No Diagnosis Coding ICD-10 Coding Code Description L89.210 Pressure ulcer of right hip, unstageable G30.1 Alzheimer's disease with late onset Follow-up Appointments ppointment in 2 weeks. - with Dr. Mikey Bussing Return A Other: - Halo=Supplies Bathing/ Shower/ Hygiene May shower and wash wound with soap and water. Off-Loading Other: - Keep off right hip  when sleeping Additional Orders / Instructions Follow Nutritious Diet - Increase protein, 100-120g daily. Wound Treatment Wound #1 - Trochanter Wound Laterality: Right Cleanser: Soap and Water 1 x Per Day/30 Days Discharge Instructions: May shower and wash wound with dial antibacterial soap and water prior to dressing change. Cleanser: Wound Cleanser 1 x Per Day/30 Days Discharge Instructions: Cleanse the wound with wound cleanser prior to applying a clean dressing using gauze sponges, not tissue or cotton balls. Peri-Wound Care: Skin Prep 1 x Per Day/30 Days Discharge Instructions: Use skin prep as directed Prim Dressing: Santyl Ointment 1 x Per Day/30 Days ary Discharge Instructions: Apply nickel thick amount to wound bed as instructed Secondary Dressing: Woven Gauze Sponge, Non-Sterile 4x4 in 1 x Per Day/30 Days Discharge Instructions: Apply saline moistened gauze over primary dressing. Secondary Dressing: Zetuvit Plus Silicone Border Dressing 5x5 (in/in) 1 x Per Day/30 Days Discharge Instructions: Apply silicone border over primary dressing as directed. Electronic Signature(s) Signed: 10/07/2020 11:32:06 AM By: Geralyn Corwin DO Previous Signature: 10/07/2020 8:05:33 AM Version By: Antonieta Iba Entered By: Geralyn Corwin on 10/07/2020 11:29:11 -------------------------------------------------------------------------------- Problem List Details Patient Name: Date of Service: [name], Khadejah W. 10/07/2020 8:00 A M Medical Record Number: [id number] Patient Account Number: [id number] Date of Birth/Sex: Treating RN: 1940-08-21 (80 y.o. [name] Primary Care Provider: [name] Other Clinician: Referring Provider: Treating Provider/Extender: Loanne Drilling, Amy Weeks in Treatment: 1 Active Problems ICD-10 Encounter Code Description Active Date MDM Diagnosis L89.210 Pressure ulcer of right hip, unstageable 09/30/2020 No Yes G30.1 Alzheimer's disease with late  onset 09/30/2020 No Yes Inactive Problems Resolved Problems Electronic Signature(s) Signed: 10/07/2020 11:32:06 AM By: Geralyn Corwin DO Previous Signature: 10/07/2020 8:05:13 AM Version By: Antonieta Iba Entered By: Geralyn Corwin on 10/07/2020 11:25:53 -------------------------------------------------------------------------------- Progress Note Details Patient Name: Date of Service: [name], [name] 10/07/2020 8:00 A M Medical Record Number: [id number] Patient Account Number: [id number] Date of Birth/Sex: Treating RN: November 11, 1940 (80 y.o. [name] Primary Care Provider: [name] Other Clinician: Referring Provider: Treating Provider/Extender: Loanne Drilling, Amy Weeks in Treatment: 1 Subjective Chief Complaint Information obtained from Patient Right trochanter wound History of Present Illness (HPI) Admission 6/20 Ms. Myrtis Maille is a 80 year old female with a past medical history of dementia, hypertension that presents to the clinic for a 2-24-month history of right trochanter wound. Daughter  is present and helps provide the history. Patient is not sure exactly how this wound occurred but was noticed one day by the daughter a couple of months ago. She has visited urgent care on 2 occasions for this issue. She has been treated with doxycycline and clindamycin. She did have a culture that showed Pseudomonas. She has not received any other antibiotics besides these two antibiotics. She does tend to sleep on her right side mostly. She is not very mobile and sits in a wheelchair most of the day. She reports mild pain to the area. She reports green drainage. She denies increased warmth, erythema or purulent drainage. She did experience a fall 3 months ago and visited the ED for this. She ended up having stitches to her scalp. 6/27; patient presents for 1 week follow-up. She is present with her daughter who helps provide the history. She started using Santyl daily with  dressing changes. She is also aggressively offloading the area. She overall feels well and has no complaints today. She denies signs of infection. Patient History Unable to Obtain Patient History due to Dementia. Information obtained from Patient. Family History Heart Disease - Mother,Father,Siblings, Hypertension - Mother,Father,Siblings, Lung Disease - Child, No family history of Cancer, Diabetes, Hereditary Spherocytosis, Kidney Disease, Seizures, Stroke, Thyroid Problems, Tuberculosis. Social History Former smoker, Marital Status - Widowed, Alcohol Use - Never, Drug Use - No History, Caffeine Use - Rarely. Medical History Eyes Patient has history of Cataracts - Cataract Surgery Hematologic/Lymphatic Patient has history of Anemia Respiratory Patient has history of Asthma Cardiovascular Patient has history of Hypertension, Peripheral Venous Disease Musculoskeletal Patient has history of Osteoarthritis Neurologic Patient has history of Dementia Medical A Surgical History Notes nd Gastrointestinal GERD Psychiatric Depression Objective Constitutional respirations regular, non-labored and within target range for patient.. Vitals Time Taken: 8:00 AM, Temperature: 98.2 F, Pulse: 64 bpm, Respiratory Rate: 16 breaths/min, Blood Pressure: 136/85 mmHg, Pulse Oximetry: 95 %. Psychiatric pleasant and cooperative. General Notes: Right lower extremity: Right trochanter has a open wound with eschar and nonviable tissue present. No obvious signs of infection. Integumentary (Hair, Skin) Wound #1 status is Open. Original cause of wound was Pressure Injury. The date acquired was: 07/26/2020. The wound has been in treatment 1 weeks. The wound is located on the Right Trochanter. The wound measures 4.1cm length x 4.3cm width x 1cm depth; 13.847cm^2 area and 13.847cm^3 volume. There is Fat Layer (Subcutaneous Tissue) exposed. There is no tunneling or undermining noted. There is a medium amount of  serosanguineous drainage noted. The wound margin is well defined and not attached to the wound base. There is no granulation within the wound bed. There is a large (67-100%) amount of necrotic tissue within the wound bed including Eschar and Adherent Slough. Assessment Active Problems ICD-10 Pressure ulcer of right hip, unstageable Alzheimer's disease with late onset Patient's wound is overall stable in appearance. I was able to remove the eschar today however there is still slough throughout. I recommended continuing Santyl. No obvious signs of infection on exam. She had a Right hip x-ray done on 6/21 that showed intact bilateral total hip replacements with no acute osseous abnormalities On the right. I will see her back in 2 weeks. I recommended continuing to aggressively offload the area. Procedures Wound #1 Pre-procedure diagnosis of Wound #1 is a Pressure Ulcer located on the Right Trochanter . There was a Selective/Open Wound Non-Viable Tissue Debridement with a total area of 9 sq cm performed by Geralyn Corwin, DO. With the  following instrument(s): Blade, Forceps, and Scissors to remove Non-Viable tissue/material. Material removed includes Eschar. No specimens were taken. A time out was conducted at 08:35, prior to the start of the procedure. A Minimum amount of bleeding was controlled with Pressure. The procedure was tolerated well. Post Debridement Measurements: 4.1cm length x 4.3cm width x 1cm depth; 13.847cm^3 volume. Post debridement Stage noted as Unstageable/Unclassified. Character of Wound/Ulcer Post Debridement is stable. Post procedure Diagnosis Wound #1: Same as Pre-Procedure Plan Follow-up Appointments: Return Appointment in 2 weeks. - with Dr. Mikey Bussing Other: - [facility]=Supplies Bathing/ Shower/ Hygiene: May shower and wash wound with soap and water. Off-Loading: Other: - Keep off right hip when sleeping Additional Orders / Instructions: Follow Nutritious Diet - Increase  protein, 100-120g daily. WOUND #1: - Trochanter Wound Laterality: Right Cleanser: Soap and Water 1 x Per Day/30 Days Discharge Instructions: May shower and wash wound with dial antibacterial soap and water prior to dressing change. Cleanser: Wound Cleanser 1 x Per Day/30 Days Discharge Instructions: Cleanse the wound with wound cleanser prior to applying a clean dressing using gauze sponges, not tissue or cotton balls. Peri-Wound Care: Skin Prep 1 x Per Day/30 Days Discharge Instructions: Use skin prep as directed Prim Dressing: Santyl Ointment 1 x Per Day/30 Days ary Discharge Instructions: Apply nickel thick amount to wound bed as instructed Secondary Dressing: Woven Gauze Sponge, Non-Sterile 4x4 in 1 x Per Day/30 Days Discharge Instructions: Apply saline moistened gauze over primary dressing. Secondary Dressing: Zetuvit Plus Silicone Border Dressing 5x5 (in/in) 1 x Per Day/30 Days Discharge Instructions: Apply silicone border over primary dressing as directed. 1. In office sharp debridement 2. Aggressive offloading 3. Continue Santyl 4. Follow-up in 2 weeks Electronic Signature(s) Signed: 10/07/2020 11:32:06 AM By: Geralyn Corwin DO Entered By: Geralyn Corwin on 10/07/2020 11:31:33 -------------------------------------------------------------------------------- HxROS Details Patient Name: Date of Service: [name], [name] 10/07/2020 8:00 A M Medical Record Number: [id number] Patient Account Number: [id number] Date of Birth/Sex: Treating RN: 1940-08-31 (80 y.o. [name] Primary Care Provider: [name] Other Clinician: Referring Provider: Treating Provider/Extender: Loanne Drilling, Amy Weeks in Treatment: 1 Unable to Obtain Patient History due to Dementia Information Obtained From Patient Eyes Medical History: Positive for: Cataracts - Cataract Surgery Hematologic/Lymphatic Medical History: Positive for: Anemia Respiratory Medical  History: Positive for: Asthma Cardiovascular Medical History: Positive for: Hypertension; Peripheral Venous Disease Gastrointestinal Medical History: Past Medical History Notes: GERD Musculoskeletal Medical History: Positive for: Osteoarthritis Neurologic Medical History: Positive for: Dementia Psychiatric Medical History: Past Medical History Notes: Depression HBO Extended History Items Eyes: Cataracts Immunizations Pneumococcal Vaccine: Received Pneumococcal Vaccination: No Implantable Devices None Family and Social History Cancer: No; Diabetes: No; Heart Disease: Yes - Mother,Father,Siblings; Hereditary Spherocytosis: No; Hypertension: Yes - Mother,Father,Siblings; Kidney Disease: No; Lung Disease: Yes - Child; Seizures: No; Stroke: No; Thyroid Problems: No; Tuberculosis: No; Former smoker; Marital Status - Widowed; Alcohol Use: Never; Drug Use: No History; Caffeine Use: Rarely; Financial Concerns: No; Food, Clothing or Shelter Needs: No; Support System Lacking: No; Transportation Concerns: No Electronic Signature(s) Signed: 10/07/2020 11:32:06 AM By: Geralyn Corwin DO Signed: 10/07/2020 5:08:20 PM By: Antonieta Iba Entered By: Geralyn Corwin on 10/07/2020 11:28:23 -------------------------------------------------------------------------------- SuperBill Details Patient Name: Date of Service: [name], [name] 10/07/2020 Medical Record Number: [id number] Patient Account Number: [id number] Date of Birth/Sex: Treating RN: [birth year] (80 y.o. [name] Primary Care Provider: [name] Other Clinician: Referring Provider: Treating Provider/Extender: Loanne Drilling, Amy Weeks in Treatment: 1 Diagnosis Coding ICD-10 Codes Code Description [id number]  Pressure ulcer of right hip, unstageable G30.1 Alzheimer's disease with late onset Facility Procedures Physician Procedures : CPT4 Code Description Modifier [id number] - WC PHYS DEBR WO ANESTH 20 SQ  CM ICD-10 Diagnosis Description L89.210 Pressure ulcer of right hip, unstageable Quantity: 1 Electronic Signature(s) Signed: 10/07/2020 11:32:06 AM By: Geralyn Corwin DO Entered By: Geralyn Corwin on 10/07/2020 11:31:41

## 2020-10-22 ENCOUNTER — Encounter (HOSPITAL_BASED_OUTPATIENT_CLINIC_OR_DEPARTMENT_OTHER): Payer: MEDICARE | Attending: Internal Medicine | Admitting: Internal Medicine

## 2020-10-22 ENCOUNTER — Other Ambulatory Visit: Payer: Self-pay

## 2020-10-22 DIAGNOSIS — L89210 Pressure ulcer of right hip, unstageable: Secondary | ICD-10-CM | POA: Diagnosis not present

## 2020-10-22 DIAGNOSIS — G301 Alzheimer's disease with late onset: Secondary | ICD-10-CM | POA: Diagnosis not present

## 2020-10-22 NOTE — Progress Notes (Signed)
[name], [name] ([id number]) Visit Report for 10/22/2020 Chief Complaint Document Details Patient Name: Date of Service: [place]. 10/22/2020 8:00 A M Medical Record Number: [id number] Patient Account Number: [id number] Date of Birth/Sex: Treating RN: [birth year] (80 y.o. Wynelle Link Primary Care Provider: [name] Other Clinician: Referring Provider: Treating Provider/Extender: Loanne Drilling, Amy Weeks in Treatment: 3 Information Obtained from: Patient Chief Complaint Right trochanter wound Electronic Signature(s) Signed: 10/22/2020 9:16:05 AM By: Geralyn Corwin DO Entered By: Geralyn Corwin on 10/22/2020 09:08:10 -------------------------------------------------------------------------------- Debridement Details Patient Name: Date of Service: [name], [name] 10/22/2020 8:00 A M Medical Record Number: [id number] Patient Account Number: [id number] Date of Birth/Sex: Treating RN: 1940/10/05 (80 y.o. Wynelle Link Primary Care Provider: [name] Other Clinician: Referring Provider: Treating Provider/Extender: Loanne Drilling, Amy Weeks in Treatment: 3 Debridement Performed for Assessment: Wound #1 Right Trochanter Performed By: Physician Geralyn Corwin, DO Debridement Type: Debridement Level of Consciousness (Pre-procedure): Awake and Alert Pre-procedure Verification/Time Out Yes - 08:58 Taken: Start Time: 08:58 Pain Control: Other : Benzocaine 20% T Area Debrided (L x W): otal 2 (cm) x 2 (cm) = 4 (cm) Tissue and other material debrided: Viable, Eschar, Slough, Slough Level: Non-Viable Tissue Debridement Description: Selective/Open Wound Instrument: Blade, Forceps Bleeding: Minimum Hemostasis Achieved: Pressure End Time: 09:00 Procedural Pain: 0 Post Procedural Pain: 0 Response to Treatment: Procedure was tolerated well Level of Consciousness (Post- Awake and Alert procedure): Post Debridement Measurements of Total  Wound Length: (cm) 3.8 Stage: Unstageable/Unclassified Width: (cm) 2.7 Depth: (cm) 1 Volume: (cm) 8.058 Character of Wound/Ulcer Post Debridement: Requires Further Debridement Post Procedure Diagnosis Same as Pre-procedure Electronic Signature(s) Signed: 10/22/2020 9:16:05 AM By: Geralyn Corwin DO Signed: 10/22/2020 5:43:47 PM By: Zandra Abts RN, BSN Entered By: Zandra Abts on 10/22/2020 09:00:13 -------------------------------------------------------------------------------- HPI Details Patient Name: Date of Service: [name], [name] 10/22/2020 8:00 A M Medical Record Number: [id number] Patient Account Number: [id number] Date of Birth/Sex: Treating RN: 1941/01/08 (80 y.o. Wynelle Link Primary Care Provider: [name] Other Clinician: Referring Provider: Treating Provider/Extender: Loanne Drilling, Amy Weeks in Treatment: 3 History of Present Illness HPI Description: Admission 6/20 Ms. [name] is a 80 year old female with a past medical history of dementia, hypertension that presents to the clinic for a 2-37-month history of right trochanter wound. Daughter is present and helps provide the history. Patient is not sure exactly how this wound occurred but was noticed one day by the daughter a couple of months ago. She has visited urgent care on 2 occasions for this issue. She has been treated with doxycycline and clindamycin. She did have a culture that showed Pseudomonas. She has not received any other antibiotics besides these two antibiotics. She does tend to sleep on her right side mostly. She is not very mobile and sits in a wheelchair most of the day. She reports mild pain to the area. She reports green drainage. She denies increased warmth, erythema or purulent drainage. She did experience a fall 3 months ago and visited the ED for this. She ended up having stitches to her scalp. 6/27; patient presents for 1 week follow-up. She is present with her daughter  who helps provide the history. She started using Santyl daily with dressing changes. She is also aggressively offloading the area. She overall feels well and has no complaints today. She denies signs of infection. 7/12; patient presents for 1 week follow-up. Her daughter is present and helps provide the history. They have been aggressively offloading  the area the best they can. She has changed her bed around to make this happen. She has been using Santyl with wet-to-dry gauze backing. Patient is in better spirits today. She overall feels well and has no complaints today. She denies signs of infection. Electronic Signature(s) Signed: 10/22/2020 9:16:05 AM By: Geralyn CorwinHoffman, Aylen Rambert DO Entered By: Geralyn CorwinHoffman, Othon Guardia on 10/22/2020 09:09:12 -------------------------------------------------------------------------------- Physical Exam Details Patient Name: Date of Service: [name], [name] 10/22/2020 8:00 A M Medical Record Number: [id number] Patient Account Number: [id number] Date of Birth/Sex: Treating RN: 1941/01/28 (80 y.o. Wynelle LinkF) Lynch, Shatara Primary Care Provider: Hazle NordmannFargo, Amy Other Clinician: Referring Provider: Treating Provider/Extender: Loanne DrillingHoffman, Dynisha Due [place], Amy Weeks in Treatment: 3 Constitutional respirations regular, non-labored and within target range for patient.Marland Kitchen. Psychiatric pleasant and cooperative. Notes Right lower extremity: Right trochanter has a open wound with nonviable tissue present And granulation tissue circumferentially. No Increased erythema, warmth or purulent drainage. No induration noted and surrounding skin appears healthy and intact Electronic Signature(s) Signed: 10/22/2020 9:16:05 AM By: Geralyn CorwinHoffman, Chasen Mendell DO Entered By: Geralyn CorwinHoffman, Danila Eddie on 10/22/2020 09:12:55 -------------------------------------------------------------------------------- Physician Orders Details Patient Name: Date of Service: [name], [name] 10/22/2020 8:00 A M Medical Record Number:  [id number] Patient Account Number: [id number] Date of Birth/Sex: Treating RN: 1941/01/28 (80 y.o. Wynelle LinkF) Lynch, Shatara Primary Care Provider: Hazle NordmannFargo, Amy Other Clinician: Referring Provider: Treating Provider/Extender: Loanne DrillingHoffman, Chey Rachels [place], Amy Weeks in Treatment: 3 Verbal / Phone Orders: No Diagnosis Coding ICD-10 Coding Code Description L89.210 Pressure ulcer of right hip, unstageable G30.1 Alzheimer's disease with late onset Follow-up Appointments ppointment in 2 weeks. - with Dr. Mikey BussingHoffman Return A Bathing/ Shower/ Hygiene May shower and wash wound with soap and water. Off-Loading Other: - Keep off right hip when sleeping Additional Orders / Instructions Follow Nutritious Diet - Increase protein, 100-120g daily. Wound Treatment Wound #1 - Trochanter Wound Laterality: Right Cleanser: Normal Saline (DME) (Generic) 1 x Per Day/30 Days Discharge Instructions: Cleanse the wound with Normal Saline prior to applying a clean dressing using gauze sponges, not tissue or cotton balls. Cleanser: Soap and Water 1 x Per Day/30 Days Discharge Instructions: May shower and wash wound with dial antibacterial soap and water prior to dressing change. Cleanser: Wound Cleanser 1 x Per Day/30 Days Discharge Instructions: Cleanse the wound with wound cleanser prior to applying a clean dressing using gauze sponges, not tissue or cotton balls. Peri-Wound Care: Skin Prep 1 x Per Day/30 Days Discharge Instructions: Use skin prep as directed Prim Dressing: Santyl Ointment 1 x Per Day/30 Days ary Discharge Instructions: Apply nickel thick amount to wound bed as instructed Secondary Dressing: Woven Gauze Sponge, Non-Sterile 4x4 in (DME) (Generic) 1 x Per Day/30 Days Discharge Instructions: Apply saline moistened gauze over primary dressing. Secondary Dressing: Zetuvit Plus Silicone Border Dressing 5x5 (in/in) (DME) (Generic) 1 x Per Day/30 Days Discharge Instructions: Apply silicone border over primary  dressing as directed. Secured With: 50M Medipore H Soft Cloth Surgical Tape, 2x2 (in/yd) (DME) (Generic) 1 x Per Day/30 Days Discharge Instructions: Secure dressing with tape as directed. Electronic Signature(s) Signed: 10/22/2020 9:16:05 AM By: Geralyn CorwinHoffman, Charlene Detter DO Entered By: Geralyn CorwinHoffman, Kani Jobson on 10/22/2020 09:13:09 -------------------------------------------------------------------------------- Problem List Details Patient Name: Date of Service: [name] PersonWA DE, Tajha W. 10/22/2020 8:00 A M Medical Record Number: [id number] Patient Account Number: [id number] Date of Birth/Sex: Treating RN: 1941/01/28 (80 y.o. Wynelle LinkF) Lynch, Shatara Primary Care Provider: Hazle NordmannFargo, Amy Other Clinician: Referring Provider: Treating Provider/Extender: Loanne DrillingHoffman, Antoria Lanza [place], Amy Weeks in Treatment: 3 Active Problems ICD-10 Encounter Code Description Active Date MDM  Diagnosis L89.210 Pressure ulcer of right hip, unstageable 09/30/2020 No Yes G30.1 Alzheimer's disease with late onset 09/30/2020 No Yes Inactive Problems Resolved Problems Electronic Signature(s) Signed: 10/22/2020 9:16:05 AM By: Geralyn Corwin DO Entered By: Geralyn Corwin on 10/22/2020 09:07:52 -------------------------------------------------------------------------------- Progress Note Details Patient Name: Date of Service: [name], Danira W. 10/22/2020 8:00 A M Medical Record Number: [id number] Patient Account Number: [id number] Date of Birth/Sex: Treating RN: 1941-02-10 (80 y.o. Wynelle Link Primary Care Provider: [name] Other Clinician: Referring Provider: Treating Provider/Extender: Loanne Drilling, Amy Weeks in Treatment: 3 Subjective Chief Complaint Information obtained from Patient Right trochanter wound History of Present Illness (HPI) Admission 6/20 Ms. Zariya Minner is a 80 year old female with a past medical history of dementia, hypertension that presents to the clinic for a 2-47-month history of right trochanter wound.  Daughter is present and helps provide the history. Patient is not sure exactly how this wound occurred but was noticed one day by the daughter a couple of months ago. She has visited urgent care on 2 occasions for this issue. She has been treated with doxycycline and clindamycin. She did have a culture that showed Pseudomonas. She has not received any other antibiotics besides these two antibiotics. She does tend to sleep on her right side mostly. She is not very mobile and sits in a wheelchair most of the day. She reports mild pain to the area. She reports green drainage. She denies increased warmth, erythema or purulent drainage. She did experience a fall 3 months ago and visited the ED for this. She ended up having stitches to her scalp. 6/27; patient presents for 1 week follow-up. She is present with her daughter who helps provide the history. She started using Santyl daily with dressing changes. She is also aggressively offloading the area. She overall feels well and has no complaints today. She denies signs of infection. 7/12; patient presents for 1 week follow-up. Her daughter is present and helps provide the history. They have been aggressively offloading the area the best they can. She has changed her bed around to make this happen. She has been using Santyl with wet-to-dry gauze backing. Patient is in better spirits today. She overall feels well and has no complaints today. She denies signs of infection. Patient History Unable to Obtain Patient History due to Dementia. Information obtained from Patient. Family History Heart Disease - Mother,Father,Siblings, Hypertension - Mother,Father,Siblings, Lung Disease - Child, No family history of Cancer, Diabetes, Hereditary Spherocytosis, Kidney Disease, Seizures, Stroke, Thyroid Problems, Tuberculosis. Social History Former smoker, Marital Status - Widowed, Alcohol Use - Never, Drug Use - No History, Caffeine Use - Rarely. Medical  History Eyes Patient has history of Cataracts - Cataract Surgery Hematologic/Lymphatic Patient has history of Anemia Respiratory Patient has history of Asthma Cardiovascular Patient has history of Hypertension, Peripheral Venous Disease Musculoskeletal Patient has history of Osteoarthritis Neurologic Patient has history of Dementia Medical A Surgical History Notes nd Gastrointestinal GERD Psychiatric Depression Objective Constitutional respirations regular, non-labored and within target range for patient.. Vitals Time Taken: 8:21 AM, Temperature: 97.9 F, Pulse: 62 bpm, Respiratory Rate: 16 breaths/min, Blood Pressure: 104/78 mmHg. Psychiatric pleasant and cooperative. General Notes: Right lower extremity: Right trochanter has a open wound with nonviable tissue present And granulation tissue circumferentially. No Increased erythema, warmth or purulent drainage. No induration noted and surrounding skin appears healthy and intact Integumentary (Hair, Skin) Wound #1 status is Open. Original cause of wound was Pressure Injury. The date acquired was: 07/26/2020. The wound has been  in treatment 3 weeks. The wound is located on the Right Trochanter. The wound measures 3.8cm length x 2.7cm width x 1cm depth; 8.058cm^2 area and 8.058cm^3 volume. There is Fat Layer (Subcutaneous Tissue) exposed. There is no tunneling or undermining noted. There is a medium amount of serosanguineous drainage noted. The wound margin is well defined and not attached to the wound base. There is small (1-33%) pink granulation within the wound bed. There is a large (67-100%) amount of necrotic tissue within the wound bed including Eschar and Adherent Slough. Assessment Active Problems ICD-10 Pressure ulcer of right hip, unstageable Alzheimer's disease with late onset Patient presents for 2-week follow-up. She now has some granulation tissue around the rim of the wound. There is still nonviable tissue in the  center that I attempted to remove with a scalpel and pickups. We are still not able to see the depth of this wound. I recommended continuing to use Santyl and wet-to-dry saline gauze packing. There were no signs of infection on exam. Procedures Wound #1 Pre-procedure diagnosis of Wound #1 is a Pressure Ulcer located on the Right Trochanter . There was a Selective/Open Wound Non-Viable Tissue Debridement with a total area of 4 sq cm performed by Geralyn Corwin, DO. With the following instrument(s): Blade, and Forceps to remove Viable tissue/material. Material removed includes Eschar and Slough and after achieving pain control using Other (Benzocaine 20%). No specimens were taken. A time out was conducted at 08:58, prior to the start of the procedure. A Minimum amount of bleeding was controlled with Pressure. The procedure was tolerated well with a pain level of 0 throughout and a pain level of 0 following the procedure. Post Debridement Measurements: 3.8cm length x 2.7cm width x 1cm depth; 8.058cm^3 volume. Post debridement Stage noted as Unstageable/Unclassified. Character of Wound/Ulcer Post Debridement requires further debridement. Post procedure Diagnosis Wound #1: Same as Pre-Procedure Plan Follow-up Appointments: Return Appointment in 2 weeks. - with Dr. Mikey Bussing Bathing/ Shower/ Hygiene: May shower and wash wound with soap and water. Off-Loading: Other: - Keep off right hip when sleeping Additional Orders / Instructions: Follow Nutritious Diet - Increase protein, 100-120g daily. WOUND #1: - Trochanter Wound Laterality: Right Cleanser: Normal Saline (DME) (Generic) 1 x Per Day/30 Days Discharge Instructions: Cleanse the wound with Normal Saline prior to applying a clean dressing using gauze sponges, not tissue or cotton balls. Cleanser: Soap and Water 1 x Per Day/30 Days Discharge Instructions: May shower and wash wound with dial antibacterial soap and water prior to dressing  change. Cleanser: Wound Cleanser 1 x Per Day/30 Days Discharge Instructions: Cleanse the wound with wound cleanser prior to applying a clean dressing using gauze sponges, not tissue or cotton balls. Peri-Wound Care: Skin Prep 1 x Per Day/30 Days Discharge Instructions: Use skin prep as directed Prim Dressing: Santyl Ointment 1 x Per Day/30 Days ary Discharge Instructions: Apply nickel thick amount to wound bed as instructed Secondary Dressing: Woven Gauze Sponge, Non-Sterile 4x4 in (DME) (Generic) 1 x Per Day/30 Days Discharge Instructions: Apply saline moistened gauze over primary dressing. Secondary Dressing: Zetuvit Plus Silicone Border Dressing 5x5 (in/in) (DME) (Generic) 1 x Per Day/30 Days Discharge Instructions: Apply silicone border over primary dressing as directed. Secured With: 44M Medipore H Soft Cloth Surgical T ape, 2x2 (in/yd) (DME) (Generic) 1 x Per Day/30 Days Discharge Instructions: Secure dressing with tape as directed. 1. In office sharp debridement 2. Continue Santyl with wet-to-dry dressings 3. Follow-up in 2 weeks Electronic Signature(s) Signed: 10/22/2020 9:16:05 AM By:  Geralyn Corwin DO Entered By: Geralyn Corwin on 10/22/2020 09:13:33 -------------------------------------------------------------------------------- HxROS Details Patient Name: Date of Service: [name], Shawnae W. 10/22/2020 8:00 A M Medical Record Number: [id number] Patient Account Number: [id number] Date of Birth/Sex: Treating RN: 1940/07/08 (80 y.o. Wynelle Link Primary Care Provider: [name] Other Clinician: Referring Provider: Treating Provider/Extender: Loanne Drilling, Amy Weeks in Treatment: 3 Unable to Obtain Patient History due to Dementia Information Obtained From Patient Eyes Medical History: Positive for: Cataracts - Cataract Surgery Hematologic/Lymphatic Medical History: Positive for: Anemia Respiratory Medical History: Positive for:  Asthma Cardiovascular Medical History: Positive for: Hypertension; Peripheral Venous Disease Gastrointestinal Medical History: Past Medical History Notes: GERD Musculoskeletal Medical History: Positive for: Osteoarthritis Neurologic Medical History: Positive for: Dementia Psychiatric Medical History: Past Medical History Notes: Depression HBO Extended History Items Eyes: Cataracts Immunizations Pneumococcal Vaccine: Received Pneumococcal Vaccination: No Implantable Devices None Family and Social History Cancer: No; Diabetes: No; Heart Disease: Yes - Mother,Father,Siblings; Hereditary Spherocytosis: No; Hypertension: Yes - Mother,Father,Siblings; Kidney Disease: No; Lung Disease: Yes - Child; Seizures: No; Stroke: No; Thyroid Problems: No; Tuberculosis: No; Former smoker; Marital Status - Widowed; Alcohol Use: Never; Drug Use: No History; Caffeine Use: Rarely; Financial Concerns: No; Food, Clothing or Shelter Needs: No; Support System Lacking: No; Transportation Concerns: No Electronic Signature(s) Signed: 10/22/2020 9:16:05 AM By: Geralyn Corwin DO Signed: 10/22/2020 5:43:47 PM By: Zandra Abts RN, BSN Entered By: Geralyn Corwin on 10/22/2020 09:09:23 -------------------------------------------------------------------------------- SuperBill Details Patient Name: Date of Service: [name], Varonica W. 10/22/2020 Medical Record Number: [id number] Patient Account Number: [id number] Date of Birth/Sex: Treating RN: Nov 15, 1940 (80 y.o. Wynelle Link Primary Care Provider: [name] Other Clinician: Referring Provider: Treating Provider/Extender: Loanne Drilling, Amy Weeks in Treatment: 3 Diagnosis Coding ICD-10 Codes Code Description [id number] Pressure ulcer of right hip, unstageable G30.1 Alzheimer's disease with late onset Facility Procedures CPT4 Code: [id number] Description: [phone] - DEBRIDE WOUND 1ST 20 SQ CM OR < ICD-10 Diagnosis Description L89.210  Pressure ulcer of right hip, unstageable G30.1 Alzheimer's disease with late onset Modifier: Quantity: 1 Physician Procedures : CPT4 Code Description Modifier [id number] - WC PHYS DEBR WO ANESTH 20 SQ CM ICD-10 Diagnosis Description L89.210 Pressure ulcer of right hip, unstageable G30.1 Alzheimer's disease with late onset Quantity: 1 Electronic Signature(s) Signed: 10/22/2020 9:16:05 AM By: Geralyn Corwin DO Entered By: Geralyn Corwin on 10/22/2020 09:15:08

## 2020-10-28 ENCOUNTER — Encounter: Payer: Self-pay | Admitting: Neurology

## 2020-10-28 ENCOUNTER — Other Ambulatory Visit: Payer: Self-pay

## 2020-10-28 ENCOUNTER — Ambulatory Visit (INDEPENDENT_AMBULATORY_CARE_PROVIDER_SITE_OTHER): Payer: MEDICARE | Admitting: Neurology

## 2020-10-28 VITALS — BP 151/89 | HR 58 | Ht 62.0 in | Wt 143.6 lb

## 2020-10-28 DIAGNOSIS — F0391 Unspecified dementia with behavioral disturbance: Secondary | ICD-10-CM

## 2020-10-28 DIAGNOSIS — F03B18 Unspecified dementia, moderate, with other behavioral disturbance: Secondary | ICD-10-CM

## 2020-10-28 NOTE — Progress Notes (Signed)
NEUROLOGY FOLLOW UP OFFICE NOTE  [name] [id number] 03/23/1941  HISTORY OF PRESENT ILLNESS: I had the pleasure of seeing [name] in follow-up in the neurology clinic on 10/28/2020.  The patient was last seen 9 months ago for dementia. She is again accompanied by her daughter [name] who helps supplement the history today.  Records and images were personally reviewed where available.  Since her last visit, she was admitted to [facility] for almost a week in November 2021 for generalized weakness and frequent falls. She had acute cystitis and was also hypotensive. She was started on Midodrine. Donepezil was also stopped due to potential contribution to bradycardia/hypotension. Her daughter had called our office in January 2022 to report significant anxiety and sleep difficulties. She was started on mirtazapine 15mg  qhs which "helps wonders." She has also been going to the [facility] day program 5 days a week and has "made a 360 degree turn." Behaviors are much better. She has occasional hallucinations thinking she is at the beach or that she sees her deceased husband, or calls out her son's names or calls [name] a different name. She is able to feed herself, otherwise needs assistance with all ADLs. Family manages medications, finances, meals, assists with dressing and bathing. Family is happy with her current situation, the good days outweigh the bad days. She reports right knee pain. She reports having a headache last night. No recent falls.    History on Initial Assessment 07/04/2019: This is a 80 year old right-handed woman with a history of hypertension, hyperlipidemia, uterine prolapse, presenting for evaluation of memory loss. She feels her memory "sometimes comes and goes, especially when doing something." Her daughter 73 is present during the visit and reports memory changes worsened around 6 months ago. [name] has lived with her since 2005. Around 6 months ago, [name] started noticing that she  was not taking her medications correctly, the pharmacy notified her that something was going on, she was refilling medications too early. [name] started managing medications at that time, and also started taking over with finances because she would forget her zip code or information on cashier checks. The patient reports that she would write a check and get nervous, she would miss one number. She stopped driving 3 months ago, family have not let her drive due to the pandemic, but also have been concerned about accidents. She denies getting lost driving. She misplaces things frequently. She stopped cooking 2-3 months ago when she put a pot in the microwave. She is able to bathe and dress herself, but [name] had noted some hygiene issues with the uterine prolapse, she was not washing fully and there would be a smell. One time she had different clothes on with a sock on her head, ready to go somewhere. She is able to do the laundry and dishes but has a harder time following instructions. [name] has noticed personality changes, she is more irritable and defiant, when upset talks very loudly. There may be some hallucinations/delusions, she would say there is something in the couch and she feels it when sitting on it. Her 2 sisters have dementia. No history of significant head injuries or alcohol use.   She has occasional numbness in her right thumb. Her right heel hurts and feels numb. She has constipation and urinary incontinence, catheter recently taken out after procedure for uterine prolapse. She has rare hand tremors when upset. She denies any headaches, dizziness, vision changes, neck/back pain, anosmia. Sleep is good. No falls.  Laboratory Data: Lab Results  Component Value Date   TSH 0.644 02/26/2020   Lab Results  Component Value Date   VITAMINB12 413 07/04/2019    PAST MEDICAL HISTORY: Past Medical History:  Diagnosis Date   Anemia    on iron   Anxiety    Arthritis    shoulders, knees, hands,  hips; status post left knee surgery   Chronic bronchitis (HCC)    CXR 07/08/16   Dementia (HCC)    Depression    GERD (gastroesophageal reflux disease)    TAKES TUMS   H/O: pneumonia 2003   Hyperlipidemia    on med   Hypertension    Morbid obesity with BMI of 45.0-49.9, adult (HCC)    Shingles    recent 02/2017   Thoracic aortic atherosclerosis (HCC)    CXR 07/08/16    MEDICATIONS: Current Outpatient Medications on File Prior to Visit  Medication Sig Dispense Refill   acetaminophen (TYLENOL) 500 MG tablet Take 500 mg by mouth every 6 (six) hours as needed.     aspirin 81 MG chewable tablet Chew 81 mg by mouth daily.     hydrocortisone (ANUSOL-HC) 2.5 % rectal cream Place 1 application rectally 2 (two) times daily. 30 g 0   hydrOXYzine (ATARAX/VISTARIL) 25 MG tablet Take 1 tablet (25 mg total) by mouth 3 (three) times daily as needed. 90 tablet 5   midodrine (PROAMATINE) 10 MG tablet TAKE 1 TABLET BY MOUTH TWICE A DAY WITH MEALS 60 tablet 5   mirtazapine (REMERON) 15 MG tablet Take 1 tablet (15 mg total) by mouth at bedtime. 30 tablet 5   polyethylene glycol powder (GLYCOLAX/MIRALAX) 17 GM/SCOOP powder Mix 1 capful of powder in drink and take by mouth one to three times daily as needed for daily soft stools OTC 225 g 0   SANTYL ointment Apply topically daily.     sennosides-docusate sodium (SENOKOT-S) 8.6-50 MG tablet Take 2 tablets by mouth daily. 60 tablet 2   trimethoprim (TRIMPEX) 100 MG tablet Take 1 tablet (100 mg total) by mouth daily. 30 tablet 5   No current facility-administered medications on file prior to visit.    ALLERGIES: Allergies  Allergen Reactions   Effexor [Venlafaxine Hcl] Swelling and Other (See Comments)    MD ADVISES PATIENT TO NOT TAKE IN FUTURE PT STOPPED, MD DC'd MED   Sulfa Drugs Cross Reactors Itching    FAMILY HISTORY: Family History  Problem Relation Age of Onset   Heart disease Mother    Heart disease Father    Heart disease Sister     Hypertension Sister    Heart disease Sister    Hypertension Sister    Heart disease Sister    Hypertension Sister     SOCIAL HISTORY: Social History   Socioeconomic History   Marital status: Widowed    Spouse name: Not on file   Number of children: 7   Years of education: Not on file   Highest education level: Not on file  Occupational History   Occupation: retired    Associate Professor: RETIRED  Tobacco Use   Smoking status: Former    Packs/day: 1.00    Years: 25.00    Pack years: 25.00    Types: Cigarettes    Quit date: 03/07/1984    Years since quitting: 36.6   Smokeless tobacco: Former  Building services engineer Use: Never used  Substance and Sexual Activity   Alcohol use: No   Drug use: No  Sexual activity: Not Currently    Birth control/protection: Surgical  Other Topics Concern   Not on file  Social History Narrative   Marital status:   She is a widowed since 2009; married 45 years      Children:   mother of 82, grandmother of 5.      Lives: alone with home; daughter, son lives with patient      Tobacco: quit smoking 34 years ago      Alcohol: none      ADLs:  Walks with walker    She does not drink alcohol or smoke cigarettes.   Right handed    [facility]es [facility]    Social Determinants of Health   Financial Resource Strain: Not on file  Food Insecurity: Not on file  Transportation Needs: Not on file  Physical Activity: Not on file  Stress: Not on file  Social Connections: Not on file  Intimate Partner Violence: Not on file     PHYSICAL EXAM: Vitals:   10/28/20 0838  BP: (!) 151/89  Pulse: (!) 58  SpO2: 99%   General: No acute distress Head:  Normocephalic/atraumatic Skin/Extremities: No rash, no edema Neurological Exam: alert and oriented to person,  place, states it is April. States her birth year is 2018. No aphasia or dysarthria. Fund of knowledge is reduced.  Recent and remote memory are impaired.  Attention and concentration are  reduced, unable to spell WORLD backwards. Cranial nerves: Pupils equal, round. Extraocular movements intact with no nystagmus. Visual fields full.  No facial asymmetry.  Motor: +cogwheeling bilaterally, muscle strength 5/5 throughout with no pronator drift.   Finger to nose testing intact but appears to have some left-sided neglect(?).  Gait not tested. No tremor.    IMPRESSION: This is a 80 yo RH woman with a history of hypertension, hyperlipidemia, uterine prolapse, with moderate dementia, likely due to Alzheimer's disease. MRI brain showed mild diffuse atrophy, minimal chronic microvascular disease. Donepezil was discontinued due to bradycardia/hypotension. Mirtazapine 15mg  qhs has helped significantly with sleep and behavioral changes (anxiety). Continue day program activities. We discussed medications used in dementia, and agreed that at this point, weighing potential benefits and side effects of Memantine, we will hold off on starting another medication. [facility] 24/7 care. Follow-up in 6 months, call for any changes.    Thank you for allowing me to participate in her care.  Please do not hesitate to call for any questions or concerns.    , M.D.   CC: Patrcia Dolly, NP

## 2020-10-28 NOTE — Patient Instructions (Signed)
Good to see you! Continue all your medications. Follow-up in 6 months, call for any changes  FALL PRECAUTIONS: Be cautious when walking. Scan the area for obstacles that may increase the risk of trips and falls. When getting up in the mornings, sit up at the edge of the bed for a few minutes before getting out of bed. Consider elevating the bed at the head end to avoid drop of blood pressure when getting up. Walk always in a well-lit room (use night lights in the walls). Avoid area rugs or power cords from appliances in the middle of the walkways. Use a walker or a cane if necessary and consider physical therapy for balance exercise. Get your eyesight checked regularly.  HOME SAFETY: Consider the safety of the kitchen when operating appliances like stoves, microwave oven, and blender. Consider having supervision and share cooking responsibilities until no longer able to participate in those. Accidents with firearms and other hazards in the house should be identified and addressed as well.  ABILITY TO BE LEFT ALONE: If patient is unable to contact 911 operator, consider using LifeLine, or when the need is there, arrange for someone to stay with patients. Smoking is a fire hazard, consider supervision or cessation. Risk of wandering should be assessed by caregiver and if detected at any point, supervision and safe proof recommendations should be instituted.  RECOMMENDATIONS FOR ALL PATIENTS WITH MEMORY PROBLEMS: 1. Continue to exercise (Recommend 30 minutes of walking everyday, or 3 hours every week) 2. Increase social interactions - continue going to Church and enjoy social gatherings with friends and family 3. Eat healthy, avoid fried foods and eat more fruits and vegetables 4. Maintain adequate blood pressure, blood sugar, and blood cholesterol level. Reducing the risk of stroke and cardiovascular disease also helps promoting better memory. 5. Avoid stressful situations. Live a simple life and avoid  aggravations. Organize your time and prepare for the next day in anticipation. 6. Sleep well, avoid any interruptions of sleep and avoid any distractions in the bedroom that may interfere with adequate sleep quality 7. Avoid sugar, avoid sweets as there is a strong link between excessive sugar intake, diabetes, and cognitive impairment The Mediterranean diet has been shown to help patients reduce the risk of progressive memory disorders and reduces cardiovascular risk. This includes eating fish, eat fruits and green leafy vegetables, nuts like almonds and hazelnuts, walnuts, and also use olive oil. Avoid fast foods and fried foods as much as possible. Avoid sweets and sugar as sugar use has been linked to worsening of memory function.  There is always a concern of gradual progression of memory problems. If this is the case, then we may need to adjust level of care according to patient needs. Support, both to the patient and caregiver, should then be put into place.  

## 2020-10-28 NOTE — Progress Notes (Signed)
**Note [name]-Identified via Obfuscation** [name], [name] ([id number]) Visit Report for 10/22/2020 Arrival Information Details Patient Name: Date of Service: [place]. 10/22/2020 8:00 A M Medical Record Number: [id number] Patient Account Number: [id number] Date of Birth/Sex: Treating RN: 05-24-1940 (80 y.o. [name] Primary Care [name]: [name] Other Clinician: Referring [name]: Treating [name]/Extender: Loanne Drilling, Amy Weeks in Treatment: 3 Visit Information History Since Last Visit Added or deleted any medications: No Patient Arrived: Wheel Chair Any new allergies or adverse reactions: No Arrival Time: 08:20 Had a fall or experienced change in No Accompanied By: daughter activities of daily living that may affect Transfer Assistance: None risk of falls: Patient Identification Verified: Yes Signs or symptoms of abuse/neglect since last visito No Secondary Verification Process Completed: Yes Hospitalized since last visit: No Patient Requires Transmission-Based Precautions: No Implantable device outside of the clinic excluding No Patient Has Alerts: Yes cellular tissue based products placed in the center Patient Alerts: Patient on Blood Thinner since last visit: Has Dressing in Place as Prescribed: Yes Pain Present Now: No Electronic Signature(s) Signed: 10/22/2020 2:49:47 PM By: Karl Ito Entered By: Karl Ito on 10/22/2020 08:21:10 -------------------------------------------------------------------------------- Encounter Discharge Information Details Patient Name: Date of Service: [name], [name] 10/22/2020 8:00 A M Medical Record Number: [id number] Patient Account Number: [id number] Date of Birth/Sex: Treating RN: 1941/04/01 (80 y.o. [name] Primary Care Rissie Sculley: [name] Other Clinician: Referring Jelisa : Treating Giana Castner/Extender: Loanne Drilling, Amy Weeks in Treatment: 3 Encounter Discharge Information Items Post Procedure  Vitals Discharge Condition: Stable Temperature (F): 97.9 Ambulatory Status: Wheelchair Pulse (bpm): 62 Discharge Destination: Home Respiratory Rate (breaths/min): 16 Transportation: Private Auto Blood Pressure (mmHg): 104/78 Accompanied By: daughter Schedule Follow-up Appointment: Yes Clinical Summary of Care: Patient Declined Electronic Signature(s) Signed: 10/22/2020 5:43:47 PM By: Zandra Abts RN, BSN Entered By: Zandra Abts on 10/22/2020 10:44:09 -------------------------------------------------------------------------------- Multi Wound Chart Details Patient Name: Date of Service: [name], [name] 10/22/2020 8:00 A M Medical Record Number: [id number] Patient Account Number: [id number] Date of Birth/Sex: Treating RN: [birth year] (80 y.o. [name] Primary Care Dominico Rod: [name] Other Clinician: Referring Doratha Mcswain: Treating Chivas Notz/Extender: Loanne Drilling, Amy Weeks in Treatment: 3 Vital Signs Height(in): Pulse(bpm): 62 Weight(lbs): Blood Pressure(mmHg): 104/78 Body Mass Index(BMI): Temperature(F): 97.9 Respiratory Rate(breaths/min): 16 Photos: [1:No Photos Right Trochanter] [N/A:N/A N/A] Wound Location: [1:Pressure Injury] [N/A:N/A] Wounding Event: [1:Pressure Ulcer] [N/A:N/A] Primary Etiology: [1:Cataracts, Anemia, Asthma,] [N/A:N/A] Comorbid History: [1:Hypertension, Peripheral Venous Disease, Osteoarthritis, Dementia 07/26/2020] [N/A:N/A] Date Acquired: [1:3] [N/A:N/A] Weeks of Treatment: [1:Open] [N/A:N/A] Wound Status: [1:3.8x2.7x1] [N/A:N/A] Measurements L x W x D (cm) [1:8.058] [N/A:N/A] A (cm) : rea [1:8.058] [N/A:N/A] Volume (cm) : [1:32.50%] [N/A:N/A] % Reduction in A [1:rea: -125.00%] [N/A:N/A] % Reduction in Volume: [1:Unstageable/Unclassified] [N/A:N/A] Classification: [1:Medium] [N/A:N/A] Exudate A mount: [1:Serosanguineous] [N/A:N/A] Exudate Type: [1:red, brown] [N/A:N/A] Exudate Color: [1:Well defined, not attached]  [N/A:N/A] Wound Margin: [1:Small (1-33%)] [N/A:N/A] Granulation A mount: [1:Pink] [N/A:N/A] Granulation Quality: [1:Large (67-100%)] [N/A:N/A] Necrotic A mount: [1:Eschar, Adherent Slough] [N/A:N/A] Necrotic Tissue: [1:Fat Layer (Subcutaneous Tissue): Yes N/A] Exposed Structures: [1:Fascia: No Tendon: No Muscle: No Joint: No Bone: No None] [N/A:N/A] Epithelialization: [1:Debridement - Selective/Open Wound N/A] Debridement: Pre-procedure Verification/Time Out 08:58 [N/A:N/A] Taken: [1:Other] [N/A:N/A] Pain Control: [1:Necrotic/Eschar, Slough] [N/A:N/A] Tissue Debrided: [1:Non-Viable Tissue] [N/A:N/A] Level: [1:4] [N/A:N/A] Debridement A (sq cm): [1:rea Blade, Forceps] [N/A:N/A] Instrument: [1:Minimum] [N/A:N/A] Bleeding: [1:Pressure] [N/A:N/A] Hemostasis A chieved: [1:0] [N/A:N/A] Procedural Pain: [1:0] [N/A:N/A] Post Procedural Pain: [1:Procedure was tolerated well] [N/A:N/A] Debridement Treatment Response: [1:3.8x2.7x1] [N/A:N/A] Post Debridement  Measurements L x W x D (cm) [1:8.058] [N/A:N/A] Post Debridement Volume: (cm) [1:Unstageable/Unclassified] [N/A:N/A] Post Debridement Stage: [1:Debridement] [N/A:N/A] Treatment Notes Electronic Signature(s) Signed: 10/22/2020 9:16:05 AM By: Geralyn Corwin DO Signed: 10/22/2020 5:43:47 PM By: Zandra Abts RN, BSN Signed: 10/22/2020 5:43:47 PM By: Zandra Abts RN, BSN Entered By: Geralyn Corwin on 10/22/2020 09:07:58 -------------------------------------------------------------------------------- Multi-Disciplinary Care Plan Details Patient Name: Date of Service: [name], [name] 10/22/2020 8:00 A M Medical Record Number: [id number] Patient Account Number: [id number] Date of Birth/Sex: Treating RN: 1941/02/03 (80 y.o. [name] Primary Care Omie Ferger: [name] Other Clinician: Referring Terrance Usery: Treating Krista Godsil/Extender: Loanne Drilling, Amy Weeks in Treatment: 3 Active Inactive Pressure Nursing  Diagnoses: Knowledge deficit related to causes and risk factors for pressure ulcer development Knowledge deficit related to management of pressures ulcers Goals: Patient will remain free from development of additional pressure ulcers Date Initiated: 09/30/2020 Target Resolution Date: 11/04/2020 Goal Status: Active Patient/caregiver will verbalize understanding of pressure ulcer management Date Initiated: 09/30/2020 Target Resolution Date: 11/04/2020 Goal Status: Active Interventions: Assess: immobility, friction, shearing, incontinence upon admission and as needed Assess offloading mechanisms upon admission and as needed Assess potential for pressure ulcer upon admission and as needed Provide education on pressure ulcers Notes: Wound/Skin Impairment Nursing Diagnoses: Impaired tissue integrity Goals: Patient/caregiver will verbalize understanding of skin care regimen Date Initiated: 09/30/2020 Target Resolution Date: 10/28/2020 Goal Status: Active Ulcer/skin breakdown will have a volume reduction of 30% by week 4 Date Initiated: 09/30/2020 Target Resolution Date: 10/28/2020 Goal Status: Active Interventions: Assess patient/caregiver ability to obtain necessary supplies Assess patient/caregiver ability to perform ulcer/skin care regimen upon admission and as needed Assess ulceration(s) every visit Provide education on ulcer and skin care Treatment Activities: Skin care regimen initiated : 09/30/2020 Topical wound management initiated : 09/30/2020 Notes: Electronic Signature(s) Signed: 10/22/2020 5:43:47 PM By: Zandra Abts RN, BSN Entered By: Zandra Abts on 10/22/2020 09:04:53 -------------------------------------------------------------------------------- Pain Assessment Details Patient Name: Date of Service: [name], [name] 10/22/2020 8:00 A M Medical Record Number: [id number] Patient Account Number: [id number] Date of Birth/Sex: Treating RN: July 12, 1940 (80 y.o. [name] Primary Care Notnamed Scholz: [name] Other Clinician: Referring [name]: Treating Jeslin Bazinet/Extender: Loanne Drilling, Amy Weeks in Treatment: 3 Active Problems Location of Pain Severity and Description of Pain Patient Has Paino No Site Locations Pain Management and Medication Current Pain Management: Electronic Signature(s) Signed: 10/22/2020 2:49:47 PM By: Karl Ito Signed: 10/22/2020 6:16:29 PM By: Shawn Stall Entered By: Karl Ito on 10/22/2020 08:21:34 -------------------------------------------------------------------------------- Patient/Caregiver Education Details Patient Name: Date of Service: [name], Leyani W. 7/12/2022andnbsp8:00 A M Medical Record Number: [id number] Patient Account Number: [id number] Date of Birth/Gender: Treating RN: 1940/08/30 (80 y.o. [name] Primary Care Physician: [name] Other Clinician: Referring Physician: Treating Physician/Extender: Earl Many Weeks in Treatment: 3 Education Assessment Education Provided To: Patient Education Topics Provided Wound/Skin Impairment: Methods: Explain/Verbal Responses: State content correctly Electronic Signature(s) Signed: 10/22/2020 5:43:47 PM By: Zandra Abts RN, BSN Entered By: Zandra Abts on 10/22/2020 09:05:04 -------------------------------------------------------------------------------- Wound Assessment Details Patient Name: Date of Service: [name], [name] 10/22/2020 8:00 A M Medical Record Number: [id number] Patient Account Number: [id number] Date of Birth/Sex: Treating RN: November 22, 1940 (80 y.o. [name] Primary Care Aily Tzeng: [name] Other Clinician: Referring [name]: Treating Stella Bortle/Extender: Loanne Drilling, Amy Weeks in Treatment: 3 Wound Status Wound Number: 1 Primary Pressure Ulcer Etiology: Wound Location: Right Trochanter Wound Open Wounding Event: Pressure Injury Status: Date Acquired:  07/26/2020 Comorbid Cataracts, Anemia, Asthma,  Hypertension, Peripheral Venous Weeks Of Treatment: 3 History: Disease, Osteoarthritis, Dementia Clustered Wound: No Photos Wound Measurements Length: (cm) 3.8 Width: (cm) 2.7 Depth: (cm) 1 Area: (cm) 8.058 Volume: (cm) 8.058 % Reduction in Area: 32.5% % Reduction in Volume: -125% Epithelialization: None Tunneling: No Undermining: No Wound Description Classification: Unstageable/Unclassified Wound Margin: Well defined, not attached Exudate Amount: Medium Exudate Type: Serosanguineous Exudate Color: red, brown Foul Odor After Cleansing: No Slough/Fibrino Yes Wound Bed Granulation Amount: Small (1-33%) Exposed Structure Granulation Quality: Pink Fascia Exposed: No Necrotic Amount: Large (67-100%) Fat Layer (Subcutaneous Tissue) Exposed: Yes Necrotic Quality: Eschar, Adherent Slough Tendon Exposed: No Muscle Exposed: No Joint Exposed: No Bone Exposed: No Treatment Notes Wound #1 (Trochanter) Wound Laterality: Right Cleanser Normal Saline Discharge Instruction: Cleanse the wound with Normal Saline prior to applying a clean dressing using gauze sponges, not tissue or cotton balls. Soap and Water Discharge Instruction: May shower and wash wound with dial antibacterial soap and water prior to dressing change. Wound Cleanser Discharge Instruction: Cleanse the wound with wound cleanser prior to applying a clean dressing using gauze sponges, not tissue or cotton balls. Peri-Wound Care Skin Prep Discharge Instruction: Use skin prep as directed Topical Primary Dressing Santyl Ointment Discharge Instruction: Apply nickel thick amount to wound bed as instructed Secondary Dressing Woven Gauze Sponge, Non-Sterile 4x4 in Discharge Instruction: Apply saline moistened gauze over primary dressing. Zetuvit Plus Silicone Border Dressing 5x5 (in/in) Discharge Instruction: Apply silicone border over primary dressing as directed. Secured  With 73M Medipore H Soft Cloth Surgical T ape, 2x2 (in/yd) Discharge Instruction: Secure dressing with tape as directed. Compression Wrap Compression Stockings Add-Ons Electronic Signature(s) Signed: 10/22/2020 6:16:29 PM By: Shawn Stall Signed: 10/28/2020 3:49:49 PM By: Karl Ito Entered By: Karl Ito on 10/22/2020 16:22:23 -------------------------------------------------------------------------------- Vitals Details Patient Name: Date of Service: [name], [name] 10/22/2020 8:00 A M Medical Record Number: [id number] Patient Account Number: [id number] Date of Birth/Sex: Treating RN: 1940-05-31 (80 y.o. [name] Primary Care Mayola Mcbain: [name] Other Clinician: Referring Maryori Weide: Treating Yahye Siebert/Extender: Loanne Drilling, Amy Weeks in Treatment: 3 Vital Signs Time Taken: 08:21 Temperature (F): 97.9 Pulse (bpm): 62 Respiratory Rate (breaths/min): 16 Blood Pressure (mmHg): 104/78 Reference Range: 80 - 120 mg / dl Electronic Signature(s) Signed: 10/22/2020 2:49:47 PM By: Karl Ito Entered By: Karl Ito on 10/22/2020 88:41:66

## 2020-10-31 NOTE — Progress Notes (Signed)
[name], [name] ([id number]) Visit Report for 10/07/2020 Arrival Information Details Patient Name: Date of Service: [place]. 10/07/2020 8:00 A M Medical Record Number: [id number] Patient Account Number: [id number] Date of Birth/Sex: Treating RN: 1940-08-20 (80 y.o. Arta Silence Primary Care Treyon Wymore: [name] Other Clinician: Referring Corby Villasenor: Treating Abigayl Hor/Extender: Loanne Drilling, Amy Weeks in Treatment: 1 Visit Information History Since Last Visit All ordered tests and consults were completed: No Patient Arrived: Wheel Chair Added or deleted any medications: No Arrival Time: 07:54 Any new allergies or adverse reactions: No Accompanied By: daughter/[name] Had a fall or experienced change in No Transfer Assistance: EasyPivot Patient Lift activities of daily living that may affect Patient Identification Verified: Yes risk of falls: Secondary Verification Process Completed: Yes Signs or symptoms of abuse/neglect since last visito No Patient Requires Transmission-Based Precautions: No Hospitalized since last visit: No Patient Has Alerts: Yes Implantable device outside of the clinic excluding No Patient Alerts: Patient on Blood Thinner cellular tissue based products placed in the center since last visit: Has Dressing in Place as Prescribed: Yes Pain Present Now: No Electronic Signature(s) Signed: 10/30/2020 8:47:13 PM By: Shawn Stall Previous Signature: 10/28/2020 9:07:25 PM Version By: Shawn Stall Entered By: Shawn Stall on 10/30/2020 20:45:49 -------------------------------------------------------------------------------- Encounter Discharge Information Details Patient Name: Date of Service: [name], [name] 10/07/2020 8:00 A M Medical Record Number: [id number] Patient Account Number: [id number] Date of Birth/Sex: Treating RN: [birth year] (80 y.o. Wynelle Link Primary Care Monika Chestang: [name] Other Clinician: Referring Alzada Brazee: Treating  Vendetta Pittinger/Extender: Loanne Drilling, Amy Weeks in Treatment: 1 Encounter Discharge Information Items Post Procedure Vitals Discharge Condition: Stable Temperature (F): 98.2 Ambulatory Status: Wheelchair Pulse (bpm): 64 Discharge Destination: Home Respiratory Rate (breaths/min): 16 Transportation: Private Auto Blood Pressure (mmHg): 136/85 Accompanied By: daughter Schedule Follow-up Appointment: Yes Clinical Summary of Care: Patient Declined Electronic Signature(s) Signed: 10/08/2020 5:39:04 PM By: Zandra Abts RN, BSN Entered By: Zandra Abts on 10/07/2020 12:00:12 -------------------------------------------------------------------------------- Lower Extremity Assessment Details Patient Name: Date of Service: [name], [name] 10/07/2020 8:00 A M Medical Record Number: [id number] Patient Account Number: [id number] Date of Birth/Sex: Treating RN: 11-15-1940 (80 y.o. Arta Silence Primary Care Malayah Demuro: [name] Other Clinician: Referring Ailie Gage: Treating Tasean Mancha/Extender: Loanne Drilling, Amy Weeks in Treatment: 1 Electronic Signature(s) Signed: 10/30/2020 8:47:13 PM By: Shawn Stall Previous Signature: 10/28/2020 9:07:25 PM Version By: Shawn Stall Entered By: Shawn Stall on 10/30/2020 20:46:29 -------------------------------------------------------------------------------- Multi Wound Chart Details Patient Name: Date of Service: [name], [name] 10/07/2020 8:00 A M Medical Record Number: [id number] Patient Account Number: [id number] Date of Birth/Sex: Treating RN: [birth year] (80 y.o. Roel Cluck Primary Care Jan Walters: [name] Other Clinician: Referring Vickee Mormino: Treating Claudio Mondry/Extender: Loanne Drilling, Amy Weeks in Treatment: 1 Vital Signs Height(in): Pulse(bpm): 64 Weight(lbs): Blood Pressure(mmHg): 136/85 Body Mass Index(BMI): Temperature(F): 98.2 Respiratory Rate(breaths/min): 16 Photos: [1:No Photos Right  Trochanter] [N/A:N/A N/A] Wound Location: [1:Pressure Injury] [N/A:N/A] Wounding Event: [1:Pressure Ulcer] [N/A:N/A] Primary Etiology: [1:Cataracts, Anemia, Asthma,] [N/A:N/A] Comorbid History: [1:Hypertension, Peripheral Venous Disease, Osteoarthritis, Dementia 07/26/2020] [N/A:N/A] Date Acquired: [1:1] [N/A:N/A] Weeks of Treatment: [1:Open] [N/A:N/A] Wound Status: [1:4.1x4.3x1] [N/A:N/A] Measurements L x W x D (cm) [1:13.847] [N/A:N/A] A (cm) : rea [1:13.847] [N/A:N/A] Volume (cm) : [1:-16.00%] [N/A:N/A] % Reduction in A [1:rea: -286.70%] [N/A:N/A] % Reduction in Volume: [1:Unstageable/Unclassified] [N/A:N/A] Classification: [1:Medium] [N/A:N/A] Exudate A mount: [1:Serosanguineous] [N/A:N/A] Exudate Type: [1:red, brown] [N/A:N/A] Exudate Color: [1:Well defined, not attached] [N/A:N/A] Wound Margin: [1:None Present (0%)] [N/A:N/A] Granulation A mount: [1:Large (  67-100%)] [N/A:N/A] Necrotic A mount: [1:Eschar, Adherent Slough] [N/A:N/A] Necrotic Tissue: [1:Fat Layer (Subcutaneous Tissue): Yes N/A] Exposed Structures: [1:Fascia: No Tendon: No Muscle: No Joint: No Bone: No None] [N/A:N/A] Epithelialization: [1:Debridement - Selective/Open Wound N/A] Debridement: Pre-procedure Verification/Time Out 08:35 [N/A:N/A] Taken: [1:Necrotic/Eschar] [N/A:N/A] Tissue Debrided: [1:Non-Viable Tissue] [N/A:N/A] Level: [1:9] [N/A:N/A] Debridement A (sq cm): [1:rea Blade, Forceps, Scissors] [N/A:N/A] Instrument: [1:Minimum] [N/A:N/A] Bleeding: [1:Pressure] [N/A:N/A] Hemostasis A chieved: [1:Procedure was tolerated well] [N/A:N/A] Debridement Treatment Response: [1:4.1x4.3x1] [N/A:N/A] Post Debridement Measurements L x W x D (cm) [1:13.847] [N/A:N/A] Post Debridement Volume: (cm) [1:Unstageable/Unclassified] [N/A:N/A] Post Debridement Stage: [1:Debridement] [N/A:N/A] Treatment Notes Electronic Signature(s) Signed: 10/07/2020 11:32:06 AM By: Geralyn Corwin DO Signed: 10/07/2020 5:08:20 PM  By: Antonieta Iba Entered By: Geralyn Corwin on 10/07/2020 11:25:58 -------------------------------------------------------------------------------- Multi-Disciplinary Care Plan Details Patient Name: Date of Service: [name], [name] 10/07/2020 8:00 A M Medical Record Number: [id number] Patient Account Number: [id number] Date of Birth/Sex: Treating RN: 1940/10/23 (80 y.o. Roel Cluck Primary Care Nylan Nevel: [name] Other Clinician: Referring Krystina Strieter: Treating Allissa Albright/Extender: Loanne Drilling, Amy Weeks in Treatment: 1 Active Inactive Orientation to the Wound Care Program Nursing Diagnoses: Knowledge deficit related to the wound healing center program Goals: Patient/caregiver will verbalize understanding of the Wound Healing Center Program Date Initiated: 09/30/2020 Target Resolution Date: 10/28/2020 Goal Status: Active Interventions: Provide education on orientation to the wound center Notes: Pressure Nursing Diagnoses: Knowledge deficit related to causes and risk factors for pressure ulcer development Knowledge deficit related to management of pressures ulcers Goals: Patient will remain free from development of additional pressure ulcers Date Initiated: 09/30/2020 Target Resolution Date: 11/04/2020 Goal Status: Active Patient/caregiver will verbalize understanding of pressure ulcer management Date Initiated: 09/30/2020 Target Resolution Date: 11/04/2020 Goal Status: Active Interventions: Assess: immobility, friction, shearing, incontinence upon admission and as needed Assess offloading mechanisms upon admission and as needed Assess potential for pressure ulcer upon admission and as needed Provide education on pressure ulcers Notes: Wound/Skin Impairment Nursing Diagnoses: Impaired tissue integrity Goals: Patient/caregiver will verbalize understanding of skin care regimen Date Initiated: 09/30/2020 Target Resolution Date: 10/28/2020 Goal Status:  Active Ulcer/skin breakdown will have a volume reduction of 30% by week 4 Date Initiated: 09/30/2020 Target Resolution Date: 10/28/2020 Goal Status: Active Interventions: Assess patient/caregiver ability to obtain necessary supplies Assess patient/caregiver ability to perform ulcer/skin care regimen upon admission and as needed Assess ulceration(s) every visit Provide education on ulcer and skin care Treatment Activities: Skin care regimen initiated : 09/30/2020 Topical wound management initiated : 09/30/2020 Notes: Electronic Signature(s) Signed: 10/07/2020 5:08:20 PM By: Antonieta Iba Previous Signature: 10/07/2020 8:05:41 AM Version By: Antonieta Iba Entered By: Antonieta Iba on 10/07/2020 08:42:45 -------------------------------------------------------------------------------- Pain Assessment Details Patient Name: Date of Service: [name], [name] 10/07/2020 8:00 A M Medical Record Number: [id number] Patient Account Number: [id number] Date of Birth/Sex: Treating RN: 08/21/1940 (80 y.o. Arta Silence Primary Care Lanique Gonzalo: [name] Other Clinician: Referring Darrik Richman: Treating Jaksen Fiorella/Extender: Loanne Drilling, Amy Weeks in Treatment: 1 Active Problems Location of Pain Severity and Description of Pain Patient Has Paino No Site Locations Rate the pain. Current Pain Level: 0 Pain Management and Medication Current Pain Management: Electronic Signature(s) Signed: 10/30/2020 8:47:13 PM By: Shawn Stall Previous Signature: 10/28/2020 9:07:25 PM Version By: Shawn Stall Entered By: Shawn Stall on 10/30/2020 20:46:22 -------------------------------------------------------------------------------- Patient/Caregiver Education Details Patient Name: Date of Service: [name], Lorrin W. 6/27/2022andnbsp8:00 A M Medical Record Number: [id number] Patient Account Number: [id number] Date of Birth/Gender: Treating RN: 1941-02-02 (80 y.o. Roel Cluck  Primary Care  Physician: [name] Other Clinician: Referring Physician: Treating Physician/Extender: Loanne Drilling, Amy Weeks in Treatment: 1 Education Assessment Education Provided To: Patient and Caregiver Education Topics Provided Nutrition: Methods: Explain/Verbal Responses: State content correctly Pressure: Methods: Explain/Verbal, Printed Responses: State content correctly Wound/Skin Impairment: Methods: Demonstration, Explain/Verbal, Printed Responses: State content correctly Electronic Signature(s) Signed: 10/07/2020 5:08:20 PM By: Antonieta Iba Entered By: Antonieta Iba on 10/07/2020 08:06:09 -------------------------------------------------------------------------------- Wound Assessment Details Patient Name: Date of Service: [name], [name] 10/07/2020 8:00 A M Medical Record Number: [id number] Patient Account Number: [id number] Date of Birth/Sex: Treating RN: 06/27/1940 (80 y.o. Arta Silence Primary Care Braydn Carneiro: [name] Other Clinician: Referring Yessica Putnam: Treating Desyre Calma/Extender: Loanne Drilling, Amy Weeks in Treatment: 1 Wound Status Wound Number: 1 Primary Pressure Ulcer Etiology: Wound Location: Right Trochanter Wound Open Wounding Event: Pressure Injury Status: Date Acquired: 07/26/2020 Comorbid Cataracts, Anemia, Asthma, Hypertension, Peripheral Venous Weeks Of Treatment: 1 History: Disease, Osteoarthritis, Dementia Clustered Wound: No Photos Wound Measurements Length: (cm) 4.1 Width: (cm) 4.3 Depth: (cm) 1 Area: (cm) 13.847 Volume: (cm) 13.847 % Reduction in Area: -16% % Reduction in Volume: -286.7% Epithelialization: None Tunneling: No Undermining: No Wound Description Classification: Unstageable/Unclassified Wound Margin: Well defined, not attached Exudate Amount: Medium Exudate Type: Serosanguineous Exudate Color: red, brown Foul Odor After Cleansing: No Slough/Fibrino Yes Wound Bed Granulation Amount: None  Present (0%) Exposed Structure Necrotic Amount: Large (67-100%) Fascia Exposed: No Necrotic Quality: Eschar, Adherent Slough Fat Layer (Subcutaneous Tissue) Exposed: Yes Tendon Exposed: No Muscle Exposed: No Joint Exposed: No Bone Exposed: No Treatment Notes Wound #1 (Trochanter) Wound Laterality: Right Cleanser Soap and Water Discharge Instruction: May shower and wash wound with dial antibacterial soap and water prior to dressing change. Wound Cleanser Discharge Instruction: Cleanse the wound with wound cleanser prior to applying a clean dressing using gauze sponges, not tissue or cotton balls. Peri-Wound Care Skin Prep Discharge Instruction: Use skin prep as directed Topical Primary Dressing Santyl Ointment Discharge Instruction: Apply nickel thick amount to wound bed as instructed Secondary Dressing Woven Gauze Sponge, Non-Sterile 4x4 in Discharge Instruction: Apply saline moistened gauze over primary dressing. Zetuvit Plus Silicone Border Dressing 5x5 (in/in) Discharge Instruction: Apply silicone border over primary dressing as directed. Secured With Compression Wrap Compression Stockings Facilities manager) Signed: 10/30/2020 8:47:13 PM By: Shawn Stall Previous Signature: 10/07/2020 4:13:19 PM Version By: Karl Ito Previous Signature: 10/28/2020 9:07:25 PM Version By: Shawn Stall Entered By: Shawn Stall on 10/30/2020 20:46:38 -------------------------------------------------------------------------------- Vitals Details Patient Name: Date of Service: [name], [name] 10/07/2020 8:00 A M Medical Record Number: [id number] Patient Account Number: [id number] Date of Birth/Sex: Treating RN: Jan 16, 1941 (80 y.o. Arta Silence Primary Care Jakaya Jacobowitz: [name] Other Clinician: Referring Beautifull Cisar: Treating Billye Pickerel/Extender: Loanne Drilling, Amy Weeks in Treatment: 1 Vital Signs Time Taken: 08:00 Temperature (F): 98.2 Pulse (bpm):  64 Respiratory Rate (breaths/min): 16 Blood Pressure (mmHg): 136/85 Reference Range: 80 - 120 mg / dl Airway Pulse Oximetry (%): 95 Electronic Signature(s) Signed: 10/30/2020 8:47:13 PM By: Shawn Stall Previous Signature: 10/28/2020 9:07:25 PM Version By: Shawn Stall Entered By: Shawn Stall on 10/30/2020 20:45:54

## 2020-11-01 ENCOUNTER — Other Ambulatory Visit: Payer: Self-pay

## 2020-11-01 ENCOUNTER — Ambulatory Visit (INDEPENDENT_AMBULATORY_CARE_PROVIDER_SITE_OTHER): Payer: MEDICARE | Admitting: Podiatry

## 2020-11-01 ENCOUNTER — Encounter: Payer: Self-pay | Admitting: Podiatry

## 2020-11-01 DIAGNOSIS — M79675 Pain in left toe(s): Secondary | ICD-10-CM | POA: Diagnosis not present

## 2020-11-01 DIAGNOSIS — B351 Tinea unguium: Secondary | ICD-10-CM

## 2020-11-01 DIAGNOSIS — M79674 Pain in right toe(s): Secondary | ICD-10-CM

## 2020-11-03 NOTE — Progress Notes (Signed)
Subjective: [name] is a pleasant 80 y.o. female patient seen today painful thick toenails that are difficult to trim. Pain interferes with ambulation. Aggravating factors include wearing enclosed shoe gear. Pain is relieved with periodic professional debridement.  PCP is [name], NP. Last visit was: 09/05/2020 via video visit.  Allergies  Allergen Reactions   Effexor [Venlafaxine Hcl] Swelling and Other (See Comments)    MD ADVISES PATIENT TO NOT TAKE IN FUTURE PT STOPPED, MD DC'd MED   Sulfa Drugs Cross Reactors Itching    Objective: Physical Exam  General: [name] is a pleasant 80 y.o. African American female, in NAD. AAO x 3.   Vascular:  Capillary fill time to digits <3 seconds b/l lower extremities. Faintly palpable DP pulse(s) b/l lower extremities. Faintly palpable PT pulse(s) b/l lower extremities. Pedal hair sparse. Lower extremity skin temperature gradient within normal limits.  Dermatological:  Pedal skin with normal turgor, texture and tone b/l lower extremities. No open wounds b/l lower extremities. No interdigital macerations b/l lower extremities. Toenails 1-5 b/l elongated, discolored, dystrophic, thickened, crumbly with subungual debris and tenderness to dorsal palpation.  Musculoskeletal:  DF 2/5 b/l. PF, inversion and eversion 5/5 b/l. No pain crepitus or joint limitation noted with ROM b/l. Hallux valgus with bunion deformity noted b/l lower extremities. Hammertoe(s) noted to the 2-5 bilaterally.  Neurological:  Protective sensation intact 5/5 intact bilaterally with 10g monofilament b/l. Vibratory sensation intact b/l.  Assessment and Plan:  1. Pain due to onychomycosis of toenails of both feet    -Patient to continue soft, supportive shoe gear daily. -Toenails 1-5 b/l were debrided in length and girth with sterile nail nippers and dremel without iatrogenic bleeding.  -Patient to report any pedal injuries to medical professional  immediately. -Patient/POA to call should there be question/concern in the interim.  Return in about 3 months (around 02/01/2021).  Freddie Breech, DPM

## 2020-11-05 ENCOUNTER — Encounter (HOSPITAL_BASED_OUTPATIENT_CLINIC_OR_DEPARTMENT_OTHER): Payer: MEDICARE | Admitting: Internal Medicine

## 2020-11-07 ENCOUNTER — Ambulatory Visit: Payer: MEDICARE | Admitting: Orthopedic Surgery

## 2020-11-12 ENCOUNTER — Encounter (HOSPITAL_BASED_OUTPATIENT_CLINIC_OR_DEPARTMENT_OTHER): Payer: MEDICARE | Attending: Internal Medicine | Admitting: Internal Medicine

## 2020-11-12 ENCOUNTER — Other Ambulatory Visit: Payer: Self-pay

## 2020-11-12 DIAGNOSIS — Z87891 Personal history of nicotine dependence: Secondary | ICD-10-CM | POA: Diagnosis not present

## 2020-11-12 DIAGNOSIS — I10 Essential (primary) hypertension: Secondary | ICD-10-CM | POA: Insufficient documentation

## 2020-11-12 DIAGNOSIS — F0280 Dementia in other diseases classified elsewhere without behavioral disturbance: Secondary | ICD-10-CM | POA: Diagnosis not present

## 2020-11-12 DIAGNOSIS — G301 Alzheimer's disease with late onset: Secondary | ICD-10-CM | POA: Diagnosis not present

## 2020-11-12 DIAGNOSIS — L89210 Pressure ulcer of right hip, unstageable: Secondary | ICD-10-CM | POA: Diagnosis present

## 2020-11-14 ENCOUNTER — Encounter: Payer: Self-pay | Admitting: Orthopedic Surgery

## 2020-11-14 ENCOUNTER — Other Ambulatory Visit: Payer: Self-pay

## 2020-11-14 ENCOUNTER — Ambulatory Visit (INDEPENDENT_AMBULATORY_CARE_PROVIDER_SITE_OTHER): Payer: MEDICARE | Admitting: Orthopedic Surgery

## 2020-11-14 VITALS — BP 130/80 | HR 70 | Temp 97.3°F | Ht 62.0 in | Wt 151.0 lb

## 2020-11-14 DIAGNOSIS — K5901 Slow transit constipation: Secondary | ICD-10-CM

## 2020-11-14 DIAGNOSIS — F0281 Dementia in other diseases classified elsewhere with behavioral disturbance: Secondary | ICD-10-CM

## 2020-11-14 DIAGNOSIS — F02818 Dementia in other diseases classified elsewhere, unspecified severity, with other behavioral disturbance: Secondary | ICD-10-CM

## 2020-11-14 DIAGNOSIS — R635 Abnormal weight gain: Secondary | ICD-10-CM

## 2020-11-14 DIAGNOSIS — N8111 Cystocele, midline: Secondary | ICD-10-CM

## 2020-11-14 DIAGNOSIS — G301 Alzheimer's disease with late onset: Secondary | ICD-10-CM | POA: Diagnosis not present

## 2020-11-14 DIAGNOSIS — L8946 Pressure-induced deep tissue damage of contiguous site of back, buttock and hip: Secondary | ICD-10-CM

## 2020-11-14 DIAGNOSIS — F339 Major depressive disorder, recurrent, unspecified: Secondary | ICD-10-CM

## 2020-11-14 DIAGNOSIS — I10 Essential (primary) hypertension: Secondary | ICD-10-CM

## 2020-11-14 MED ORDER — HYDROCORTISONE (PERIANAL) 2.5 % EX CREA: 1.0000 "application " | TOPICAL_CREAM | Freq: Two times a day (BID) | CUTANEOUS | 0 refills | Status: DC

## 2020-11-14 MED ORDER — TRIMETHOPRIM 100 MG PO TABS: 100.0000 mg | ORAL_TABLET | Freq: Every day | ORAL | 5 refills | Status: AC

## 2020-11-14 MED ORDER — MIRTAZAPINE 15 MG PO TABS: 15.0000 mg | ORAL_TABLET | Freq: Every day | ORAL | 5 refills | Status: AC

## 2020-11-14 MED ORDER — SENNA-DOCUSATE SODIUM 8.6-50 MG PO TABS: 2.0000 | ORAL_TABLET | Freq: Every day | ORAL | 2 refills | Status: AC

## 2020-11-14 MED ORDER — MIDODRINE HCL 10 MG PO TABS: 10.0000 mg | ORAL_TABLET | Freq: Two times a day (BID) | ORAL | 5 refills | Status: DC

## 2020-11-14 NOTE — Progress Notes (Signed)
Careteam: Patient Care Team: Octavia HeirFargo, Ginni Eichler E, NP as PCP - General (Adult Health Nurse Practitioner) Marykay LexHarding, David W, MD as PCP - Cardiology (Cardiology) Jodi GeraldsGraves, John, MD as Consulting Physician (Orthopedic Surgery) Alfredo MartinezMacDiarmid, Scott, MD as Consulting Physician (Urology) Van ClinesAquino, Karen M, MD as Consulting Physician (Neurology)  Seen by: Hazle NordmannAmy Janielle Mittelstadt, AGNP-C  [facility]ICE[facility]  Advanced Directive information    Allergies  Allergen Reactions   Effexor [Venlafaxine Hcl] Swelling and Other (See Comments)    MD ADVISES PATIENT TO NOT TAKE IN FUTURE PT STOPPED, MD DC'd MED   Sulfa Drugs Cross Reactors Itching    Chief Complaint  Patient presents with   Medical Management of Chronic Issues    Patient presents today for a 2 months follow-up.     HPI: Patient is a 80 y.o. female seen today for medical management of chronic conditions.   Daughter present for visit.   Still attends [employer] daily. Daughter very pleased with program. They provide daily vital check, stimulating activities, and 2 meals daily. [name] states she likes to sing to the other attendees.   Daughter denies recent behavioral outbursts or changes. Sometimes appears depressed and will sleep more often. Followed by Dr. Karel JarvisAquino, last seen 07/18 no medication changes. Advised 6 month follow up. Continues to take hydroxyzine to promote sleep and Remeron. Daughter keeps her on consistent schedule including bedtime.   Urinary leakage with incontinence continues to be problematic. She was seen by Evette GeorgesSarah Larocco FNP 07/28. Failed trial of pessary in the past. Daughter and patient have decided against indwelling foley catheter. They plan to have a surgical consult with Dr. Ashley RoyaltyMatthews to discuss uterine surgery, scheduled 12/11/2020.   Covid 19 infection 05/20. Daughter wanting to wait to get second booster until fall recommendations made.   Left lower leg more swollen than right. Seen by podiatry 07/22,  recommended diabetic socks to help with swelling and leg elevation. Toenails trimmed during encounter. Advised to follow up in 3 months.   Uses wheelchair for long distances and at [employer]. Uses walker around house. One fall at home, no injury.   LBM 11/14/2020.   Right hip pressure injury slow healing. Daughter does dressing changes daily. No odor or creamy drainage. Remains afebrile.   Medication refills requested today.     Review of Systems:  Review of Systems  Respiratory:  Negative for cough, shortness of breath and wheezing.   Cardiovascular:  Negative for chest pain and leg swelling.  Gastrointestinal:  Negative for abdominal pain, blood in stool, constipation, diarrhea, heartburn, nausea and vomiting.  Genitourinary:  Positive for frequency. Negative for dysuria and hematuria.  Musculoskeletal:  Positive for falls and myalgias.  Neurological:  Positive for weakness. Negative for dizziness and tingling.  Psychiatric/Behavioral:  Positive for depression and memory loss. The patient is not nervous/anxious and does not have insomnia.    Past Medical History:  Diagnosis Date   Anemia    on iron   Anxiety    Arthritis    shoulders, knees, hands, hips; status post left knee surgery   Chronic bronchitis (HCC)    CXR 07/08/16   Dementia (HCC)    Depression    GERD (gastroesophageal reflux disease)    TAKES TUMS   H/O: pneumonia 2003   Hyperlipidemia    on med   Hypertension    Morbid obesity with BMI of 45.0-49.9, adult (HCC)    Shingles    recent 02/2017   Thoracic aortic  atherosclerosis (HCC)    CXR 07/08/16   Past Surgical History:  Procedure Laterality Date   CARDIAC CATHETERIZATION  04/20/1996   patent coronaries, EF 74% (Dr. Aram Candela)   CARPAL TUNNEL RELEASE Left    CATARACT EXTRACTION, BILATERAL     COLONOSCOPY     KNEE SURGERY Left 11/2001   NM MYOVIEW LTD  06/17/2018   EF 60-65 %.  Apical thinning.  LOW RISK no ischemia or infarction.    REPLACEMENT TOTAL KNEE Left 10/2009   REPLACEMENT TOTAL KNEE Left 10/2009   SHOULDER OPEN ROTATOR CUFF REPAIR  03/01/2012   Procedure: ROTATOR CUFF REPAIR SHOULDER OPEN;  Surgeon: Mable Paris, MD;  Location: [facility] OR;  Service: Orthopedics;  Laterality: Left;  SUBSCAPULARIS REPAIR   SVD     x 7   TOTAL HIP ARTHROPLASTY Right 07/24/2016   TOTAL HIP ARTHROPLASTY Right 07/24/2016   Procedure: RIGHT TOTAL HIP ARTHROPLASTY ANTERIOR APPROACH;  Surgeon: [name], MD;  Location: [facility] OR;  Service: Orthopedics;  Laterality: Right;   TOTAL HIP ARTHROPLASTY Left 04/09/2017   Procedure: LEFT TOTAL HIP ARTHROPLASTY ANTERIOR APPROACH;  Surgeon: [name], MD;  Location: [facility] ORS;  Service: Orthopedics;  Laterality: Left;   TOTAL SHOULDER ARTHROPLASTY  01/18/2012   Procedure: TOTAL SHOULDER ARTHROPLASTY;  Surgeon: Mable Paris, MD;  Location: [facility] OR;  Service: Orthopedics;  Laterality: Left;   TOTAL SHOULDER REPLACEMENT Left 01/19/2012   TRANSTHORACIC ECHOCARDIOGRAM  06/16/2018   EF 60 to 65%.  GR 1 DD.  Normal mitral valve.  Mild aortic sclerosis but no stenosis.   TUBAL LIGATION     WISDOM TOOTH EXTRACTION     Social History:   reports that she quit smoking about 36 years ago. Her smoking use included cigarettes. She has a 25.00 pack-year smoking history. She has quit using smokeless tobacco. She reports that she does not drink alcohol and does not use drugs.  Family History  Problem Relation Age of Onset   Heart disease Mother    Heart disease Father    Heart disease Sister    Hypertension Sister    Heart disease Sister    Hypertension Sister    Heart disease Sister    Hypertension Sister     Medications: Patient's Medications  New Prescriptions   No medications on file  Previous Medications   ACETAMINOPHEN (TYLENOL) 500 MG TABLET    Take 500 mg by mouth every 6 (six) hours as needed.   ASPIRIN 81 MG CHEWABLE TABLET    Chew 81 mg by mouth daily.   HYDROCORTISONE  (ANUSOL-HC) 2.5 % RECTAL CREAM    Place 1 application rectally 2 (two) times daily.   HYDROXYZINE (ATARAX/VISTARIL) 25 MG TABLET    Take 1 tablet (25 mg total) by mouth 3 (three) times daily as needed.   MIDODRINE (PROAMATINE) 10 MG TABLET    TAKE 1 TABLET BY MOUTH TWICE A DAY WITH MEALS   MIRTAZAPINE (REMERON) 15 MG TABLET    Take 1 tablet (15 mg total) by mouth at bedtime.   POLYETHYLENE GLYCOL POWDER (GLYCOLAX/MIRALAX) 17 GM/SCOOP POWDER    Mix 1 capful of powder in drink and take by mouth one to three times daily as needed for daily soft stools OTC   SANTYL OINTMENT    Apply topically daily.   SENNOSIDES-DOCUSATE SODIUM (SENOKOT-S) 8.6-50 MG TABLET    Take 2 tablets by mouth daily.   TRIMETHOPRIM (TRIMPEX) 100 MG TABLET    Take 1 tablet (100 mg total)  by mouth daily.  Modified Medications   No medications on file  Discontinued Medications   No medications on file    Physical Exam:  Vitals:   11/14/20 1432  BP: 130/80  Pulse: 70  Temp: (!) 97.3 F (36.3 C)  TempSrc: Temporal  SpO2: 98%  Weight: 151 lb (68.5 kg)  Height: 5\' 2"  (1.575 m)   Body mass index is 27.62 kg/m. Wt Readings from Last 3 Encounters:  11/14/20 151 lb (68.5 kg)  10/28/20 143 lb 9.6 oz (65.1 kg)  06/17/20 147 lb 14.9 oz (67.1 kg)    Physical Exam Vitals reviewed.  Constitutional:      General: She is not in acute distress. HENT:     Head: Normocephalic.     Right Ear: There is no impacted cerumen.     Left Ear: There is no impacted cerumen.     Nose: Nose normal.     Mouth/Throat:     Mouth: Mucous membranes are moist.  Eyes:     General:        Right eye: No discharge.        Left eye: No discharge.  Neck:     Vascular: No carotid bruit.  Cardiovascular:     Rate and Rhythm: Normal rate and regular rhythm.     Pulses: Normal pulses.     Heart sounds: Normal heart sounds. No murmur heard. Pulmonary:     Effort: Pulmonary effort is normal. No respiratory distress.     Breath sounds: Normal  breath sounds. No wheezing.  Abdominal:     General: Abdomen is flat. Bowel sounds are normal. There is no distension.     Palpations: Abdomen is soft.     Tenderness: There is no abdominal tenderness.  Musculoskeletal:     Cervical back: Normal range of motion.     Right lower leg: No edema.     Left lower leg: Edema present.     Comments: Non-pitting  Lymphadenopathy:     Cervical: No cervical adenopathy.  Skin:    General: Skin is warm and dry.     Capillary Refill: Capillary refill takes less than 2 seconds.     Findings: Lesion present.     Comments: Patient refused examination of right hip pressure injury.   Neurological:     General: No focal deficit present.     Mental Status: She is alert. Mental status is at baseline.     Motor: Weakness present.     Gait: Gait abnormal.     Comments: Wheelchair/walker  Psychiatric:        Mood and Affect: Mood normal.        Behavior: Behavior normal.        Cognition and Memory: Memory is impaired.    Labs reviewed: Basic Metabolic Panel: Recent Labs    02/26/20 1909 02/27/20 0316 02/28/20 0159 02/29/20 0103 04/04/20 1553 04/18/20 2057 06/17/20 1823  NA  --    < > 141   < > 144 136 138  K  --    < > 3.5   < > 4.9 3.8 5.0  CL  --    < > 114*   < > 106 101 103  CO2  --    < > 18*   < > 24 23 25   GLUCOSE  --    < > 88   < > 106* 124* 110*  BUN  --    < > 20   < >  CREATININE  --    < > 1.48*   < > 1.23* 1.37* 1.32*  CALCIUM  --    < > 8.9   < > 10.2 10.5* 9.8  MG  --   --  2.1  --   --   --   --   PHOS  --   --  2.1*  --   --   --   --   TSH 0.644  --   --   --   --   --   --    < > = values in this interval not displayed.   Liver Function Tests: Recent Labs    01/30/20 1531 02/26/20 1520 04/04/20 1553  AST 24 76* 23  ALT 10 42 8  ALKPHOS 60 63 67  BILITOT 0.5 0.8 0.3  PROT 7.7 7.1 7.2  ALBUMIN 4.3 3.7 4.3   No results for input(s): LIPASE, AMYLASE in the last 8760 hours. No results for input(s):  AMMONIA in the last 8760 hours. CBC: Recent Labs    02/26/20 1520 02/28/20 0159 04/04/20 1553 04/18/20 2057 06/17/20 1823  WBC 10.1   < > 5.8 6.2 4.7  NEUTROABS 8.2*  --   --  3.3 2.5  HGB 10.4*   < > 10.0* 11.3* 11.1*  HCT 33.9*   < > 31.4* 35.6* 36.0  MCV 93.4   < > 94 98.3 98.4  PLT 249   < > 284 262 283   < > = values in this interval not displayed.   Lipid Panel: No results for input(s): CHOL, HDL, LDLCALC, TRIG, CHOLHDL, LDLDIRECT in the last 8760 hours. TSH: Recent Labs    02/26/20 1909  TSH 0.644   A1C: Lab Results  Component Value Date   HGBA1C 5.8 05/01/2012     Assessment/Plan 1. Late onset Alzheimer's dementia with behavioral disturbance (HCC) - no recent behavioral outbursts - followed by Dr. Karel Jarvis - appears to be doing well at [facility] and consistent schedule - cont hydroxyzine at night  2. Essential hypertension - controlled - no recent episodes of hypotension reported - midodrine (PROAMATINE) 10 MG tablet; Take 1 tablet (10 mg total) by mouth 2 (two) times daily with a meal.  Dispense: 60 tablet; Refill: 5 - CMP - CBC with Differential/Platelet  3. Pressure injury of deep tissue of contiguous region involving back and right hip - ongoing, slow healing - clindamycin 150 mg daily completed - cont hibiclens and daily dressing changes - promote frequent position changes - cont adding protein to diet  4. Slow transit constipation - LBM 11/14/2020 - encourage hydration with water daily - sennosides-docusate sodium (SENOKOT-S) 8.6-50 MG tablet; Take 2 tablets by mouth daily.  Dispense: 60 tablet; Refill: 2 - hydrocortisone (ANUSOL-HC) 2.5 % rectal cream; Place 1 application rectally 2 (two) times daily.  Dispense: 30 g; Refill: 0  5. Midline cystocele - plans to have upcoming surgical consult with Dr. Ashley Royalty - failed pessary in past - not interested in indwelling foley - trimethoprim (TRIMPEX) 100 MG tablet; Take 1 tablet (100 mg  total) by mouth daily.  Dispense: 30 tablet; Refill: 5  6. Weight gain - gained 8 lbs since last visit - may consider reducing Remeron if continuous weight gain - recommend reducing snacks - Hemoglobin A1c  7. Depression, recurrent (HCC) - pleasant mood today - mirtazapine (REMERON) 15 MG tablet; Take 1 tablet (15 mg total) by mouth at bedtime.  Dispense: 30 tablet;  Refill: 5   Total time: 38 minutes. Greater than 50% of total time spent doing patient education on wound care, pressure sore prevention, and diet.    Next appt: Visit date not found  Haidynn Almendarez Scherry Ran  [facility] [phone]

## 2020-11-15 LAB — CBC WITH DIFFERENTIAL/PLATELET
Absolute Monocytes: 495 cells/uL (ref 200–950)
Basophils Absolute: 31 cells/uL (ref 0–200)
Basophils Relative: 0.6 %
Eosinophils Absolute: 240 cells/uL (ref 15–500)
Eosinophils Relative: 4.7 %
HCT: 35.9 % (ref 35.0–45.0)
Hemoglobin: 11.2 g/dL — ABNORMAL LOW (ref 11.7–15.5)
Lymphs Abs: 1933 cells/uL (ref 850–3900)
MCH: 29.4 pg (ref 27.0–33.0)
MCHC: 31.2 g/dL — ABNORMAL LOW (ref 32.0–36.0)
MCV: 94.2 fL (ref 80.0–100.0)
MPV: 10.7 fL (ref 7.5–12.5)
Monocytes Relative: 9.7 %
Neutro Abs: 2402 cells/uL (ref 1500–7800)
Neutrophils Relative %: 47.1 %
Platelets: 231 10*3/uL (ref 140–400)
RBC: 3.81 10*6/uL (ref 3.80–5.10)
RDW: 15.2 % — ABNORMAL HIGH (ref 11.0–15.0)
Total Lymphocyte: 37.9 %
WBC: 5.1 10*3/uL (ref 3.8–10.8)

## 2020-11-15 LAB — COMPREHENSIVE METABOLIC PANEL
AG Ratio: 1.3 (calc) (ref 1.0–2.5)
ALT: 9 U/L (ref 6–29)
AST: 20 U/L (ref 10–35)
Albumin: 4.1 g/dL (ref 3.6–5.1)
Alkaline phosphatase (APISO): 75 U/L (ref 37–153)
BUN: 23 mg/dL (ref 7–25)
CO2: 31 mmol/L (ref 20–32)
Calcium: 9.6 mg/dL (ref 8.6–10.4)
Chloride: 103 mmol/L (ref 98–110)
Creat: 0.94 mg/dL (ref 0.60–1.00)
Globulin: 3.1 g/dL (calc) (ref 1.9–3.7)
Glucose, Bld: 90 mg/dL (ref 65–139)
Potassium: 4.6 mmol/L (ref 3.5–5.3)
Sodium: 140 mmol/L (ref 135–146)
Total Bilirubin: 0.3 mg/dL (ref 0.2–1.2)
Total Protein: 7.2 g/dL (ref 6.1–8.1)

## 2020-11-15 LAB — HEMOGLOBIN A1C
Hgb A1c MFr Bld: 5.2 % of total Hgb (ref <5.7)
Mean Plasma Glucose: 103 mg/dL
eAG (mmol/L): 5.7 mmol/L

## 2020-11-25 ENCOUNTER — Encounter (HOSPITAL_BASED_OUTPATIENT_CLINIC_OR_DEPARTMENT_OTHER): Payer: MEDICARE | Admitting: Internal Medicine

## 2020-11-25 ENCOUNTER — Other Ambulatory Visit: Payer: Self-pay

## 2020-11-25 DIAGNOSIS — L89210 Pressure ulcer of right hip, unstageable: Secondary | ICD-10-CM | POA: Diagnosis not present

## 2020-11-25 NOTE — Progress Notes (Addendum)
**Note [name]-Identified via Obfuscation** [name], [name] ([id number]) Visit Report for 11/25/2020 Arrival Information Details Patient Name: Date of Service: [place]. 11/25/2020 8:00 A M Medical Record Number: [id number] Patient Account Number: [id number] Date of Birth/Sex: Treating RN: [birth year] (80 y.o. Wynelle Link Primary Care Lachele Lievanos: [name] Other Clinician: Referring Demetry Bendickson: Treating Maame Dack/Extender: Loanne Drilling, Amy Weeks in Treatment: 8 Visit Information History Since Last Visit Added or deleted any medications: No Patient Arrived: Wheel Chair Any new allergies or adverse reactions: No Arrival Time: 08:08 Had a fall or experienced change in No Accompanied By: daughter activities of daily living that may affect Transfer Assistance: Manual risk of falls: Patient Identification Verified: Yes Signs or symptoms of abuse/neglect since last visito No Secondary Verification Process Completed: Yes Hospitalized since last visit: No Patient Requires Transmission-Based Precautions: No Implantable device outside of the clinic excluding No Patient Has Alerts: Yes cellular tissue based products placed in the center Patient Alerts: Patient on Blood Thinner since last visit: Has Dressing in Place as Prescribed: Yes Pain Present Now: No Electronic Signature(s) Signed: 11/25/2020 11:45:24 AM By: Zandra Abts RN, BSN Entered By: Zandra Abts on 11/25/2020 08:09:20 -------------------------------------------------------------------------------- Encounter Discharge Information Details Patient Name: Date of Service: [name], [name] 11/25/2020 8:00 A M Medical Record Number: [id number] Patient Account Number: [id number] Date of Birth/Sex: Treating RN: 1940/08/12 (80 y.o. Wynelle Link Primary Care Brittini Brubeck: [name] Other Clinician: Referring Darlys Buis: Treating Annora Guderian/Extender: Loanne Drilling, Amy Weeks in Treatment: 8 Encounter Discharge Information Items Post Procedure  Vitals Discharge Condition: Stable Temperature (F): 97.8 Ambulatory Status: Wheelchair Pulse (bpm): 63 Discharge Destination: Home Respiratory Rate (breaths/min): 16 Transportation: Private Auto Blood Pressure (mmHg): 145/65 Accompanied By: daughter Schedule Follow-up Appointment: Yes Clinical Summary of Care: Patient Declined Electronic Signature(s) Signed: 11/26/2020 5:48:55 PM By: Zandra Abts RN, BSN Entered By: Zandra Abts on 11/25/2020 17:20:08 -------------------------------------------------------------------------------- Lower Extremity Assessment Details Patient Name: Date of Service: [name], [name] 11/25/2020 8:00 A M Medical Record Number: [id number] Patient Account Number: [id number] Date of Birth/Sex: Treating RN: [birth year] (80 y.o. Roel Cluck Primary Care Kirstyn Lean: [name] Other Clinician: Referring Shanayah Kaffenberger: Treating Shant Hence/Extender: Loanne Drilling, Amy Weeks in Treatment: 8 Electronic Signature(s) Signed: 11/25/2020 5:36:52 PM By: Antonieta Iba Entered By: Antonieta Iba on 11/25/2020 08:22:25 -------------------------------------------------------------------------------- Multi Wound Chart Details Patient Name: Date of Service: [name], [name] 11/25/2020 8:00 A M Medical Record Number: [id number] Patient Account Number: [id number] Date of Birth/Sex: Treating RN: [birth year] (80 y.o. Roel Cluck Primary Care Merida Alcantar: [name] Other Clinician: Referring Afrah Burlison: Treating Khamille Beynon/Extender: Loanne Drilling, Amy Weeks in Treatment: 8 Vital Signs Height(in): Pulse(bpm): 63 Weight(lbs): Blood Pressure(mmHg): 145/65 Body Mass Index(BMI): Temperature(F): 97.8 Respiratory Rate(breaths/min): 16 Photos: [N/A:N/A] Right Trochanter N/A N/A Wound Location: Pressure Injury N/A N/A Wounding Event: Pressure Ulcer N/A N/A Primary Etiology: Cataracts, Anemia, Asthma, N/A N/A Comorbid History: Hypertension,  Peripheral Venous Disease, Osteoarthritis, Dementia 07/26/2020 N/A N/A Date Acquired: 8 N/A N/A Weeks of Treatment: Open N/A N/A Wound Status: 2.1x1.5x2 N/A N/A Measurements L x W x D (cm) 2.474 N/A N/A A (cm) : rea 4.948 N/A N/A Volume (cm) : 79.30% N/A N/A % Reduction in A rea: -38.20% N/A N/A % Reduction in Volume: 12 Starting Position 1 (o'clock): 3 Ending Position 1 (o'clock): 1.6 Maximum Distance 1 (cm): Yes N/A N/A Undermining: Unstageable/Unclassified N/A N/A Classification: Medium N/A N/A Exudate A mount: Serosanguineous N/A N/A Exudate Type: red, brown N/A N/A Exudate Color: Epibole N/A N/A Wound Margin: Small (1-33%) N/A  N/A Granulation Amount: Pink N/A N/A Granulation Quality: Large (67-100%) N/A N/A Necrotic Amount: Fat Layer (Subcutaneous Tissue): Yes N/A N/A Exposed Structures: Fascia: No Tendon: No Muscle: No Joint: No Bone: No None N/A N/A Epithelialization: Debridement - Excisional N/A N/A Debridement: Pre-procedure Verification/Time Out 08:19 N/A N/A Taken: Other N/A N/A Pain Control: Subcutaneous, Slough N/A N/A Tissue Debrided: Skin/Subcutaneous Tissue N/A N/A Level: 3.15 N/A N/A Debridement A (sq cm): rea Blade, Curette, Forceps, Scissors N/A N/A Instrument: Minimum N/A N/A Bleeding: Pressure N/A N/A Hemostasis A chieved: Procedure was tolerated well N/A N/A Debridement Treatment Response: 2.1x1.5x2 N/A N/A Post Debridement Measurements L x W x D (cm) 4.948 N/A N/A Post Debridement Volume: (cm) Unstageable/Unclassified N/A N/A Post Debridement Stage: Debridement N/A N/A Procedures Performed: Treatment Notes Electronic Signature(s) Signed: 11/25/2020 8:52:50 AM By: Geralyn Corwin DO Signed: 11/25/2020 5:36:52 PM By: Antonieta Iba Entered By: Geralyn Corwin on 11/25/2020 08:49:16 -------------------------------------------------------------------------------- Multi-Disciplinary Care Plan Details Patient  Name: Date of Service: [name], [name] 11/25/2020 8:00 A M Medical Record Number: [id number] Patient Account Number: [id number] Date of Birth/Sex: Treating RN: [birth year] (80 y.o. Roel Cluck Primary Care Anaily Ashbaugh: [name] Other Clinician: Referring Correy Weidner: Treating Shoshannah Faubert/Extender: Loanne Drilling, Amy Weeks in Treatment: 8 Active Inactive Pressure Nursing Diagnoses: Knowledge deficit related to causes and risk factors for pressure ulcer development Knowledge deficit related to management of pressures ulcers Goals: Patient will remain free from development of additional pressure ulcers Date Initiated: 09/30/2020 Target Resolution Date: 12/13/2020 Goal Status: Active Patient/caregiver will verbalize understanding of pressure ulcer management Date Initiated: 09/30/2020 Target Resolution Date: 12/13/2020 Goal Status: Active Interventions: Assess: immobility, friction, shearing, incontinence upon admission and as needed Assess offloading mechanisms upon admission and as needed Assess potential for pressure ulcer upon admission and as needed Provide education on pressure ulcers Notes: Wound/Skin Impairment Nursing Diagnoses: Impaired tissue integrity Goals: Patient/caregiver will verbalize understanding of skin care regimen Date Initiated: 09/30/2020 Target Resolution Date: 12/13/2020 Goal Status: Active Ulcer/skin breakdown will have a volume reduction of 30% by week 4 Date Initiated: 09/30/2020 Target Resolution Date: 12/13/2020 Goal Status: Active Interventions: Assess patient/caregiver ability to obtain necessary supplies Assess patient/caregiver ability to perform ulcer/skin care regimen upon admission and as needed Assess ulceration(s) every visit Provide education on ulcer and skin care Treatment Activities: Skin care regimen initiated : 09/30/2020 Topical wound management initiated : 09/30/2020 Notes: Electronic Signature(s) Signed: 11/25/2020 7:57:28 AM  By: Antonieta Iba Entered By: Antonieta Iba on 11/25/2020 07:57:27 -------------------------------------------------------------------------------- Pain Assessment Details Patient Name: Date of Service: [name], [name] 11/25/2020 8:00 A M Medical Record Number: [id number] Patient Account Number: [id number] Date of Birth/Sex: Treating RN: 20-Sep-1940 (80 y.o. Wynelle Link Primary Care Akeia Perot: [name] Other Clinician: Referring Milam Allbaugh: Treating Ivee Poellnitz/Extender: Loanne Drilling, Amy Weeks in Treatment: 8 Active Problems Location of Pain Severity and Description of Pain Patient Has Paino No Site Locations Pain Management and Medication Current Pain Management: Electronic Signature(s) Signed: 11/25/2020 11:45:24 AM By: Zandra Abts RN, BSN Entered By: Zandra Abts on 11/25/2020 08:09:49 -------------------------------------------------------------------------------- Patient/Caregiver Education Details Patient Name: Date of Service: [name], [name] 8/15/2022andnbsp8:00 A M Medical Record Number: [id number] Patient Account Number: [id number] Date of Birth/Gender: Treating RN: Sep 08, 1940 (80 y.o. Roel Cluck Primary Care Physician: [name] Other Clinician: Referring Physician: Treating Physician/Extender: Earl Many Weeks in Treatment: 8 Education Assessment Education Provided To: Patient Education Topics Provided Nutrition: Methods: Explain/Verbal, Printed Responses: State content correctly Pressure: Methods: Explain/Verbal, Printed Responses: State content correctly Wound/Skin  Impairment: Methods: Explain/Verbal, Printed Responses: State content correctly Electronic Signature(s) Signed: 11/25/2020 5:36:52 PM By: Antonieta Iba Entered By: Antonieta Iba on 11/25/2020 07:57:54 -------------------------------------------------------------------------------- Wound Assessment Details Patient Name: Date of Service: [name], [name] 11/25/2020 8:00 A M Medical Record Number: [id number] Patient Account Number: [id number] Date of Birth/Sex: Treating RN: 12/18/1940 (80 y.o. Wynelle Link Primary Care Eugene Zeiders: [name] Other Clinician: Referring Jayzen Paver: Treating Montrelle Eddings/Extender: Loanne Drilling, Amy Weeks in Treatment: 8 Wound Status Wound Number: 1 Primary Pressure Ulcer Etiology: Wound Location: Right Trochanter Wound Open Wounding Event: Pressure Injury Status: Date Acquired: 07/26/2020 Comorbid Cataracts, Anemia, Asthma, Hypertension, Peripheral Venous Weeks Of Treatment: 8 History: Disease, Osteoarthritis, Dementia Clustered Wound: No Photos Wound Measurements Length: (cm) 2.1 Width: (cm) 1.5 Depth: (cm) 2 Area: (cm) 2.474 Volume: (cm) 4.948 % Reduction in Area: 79.3% % Reduction in Volume: -38.2% Epithelialization: None Tunneling: No Undermining: Yes Starting Position (o'clock): 12 Ending Position (o'clock): 3 Maximum Distance: (cm) 1.6 Wound Description Classification: Unstageable/Unclassified Wound Margin: Epibole Exudate Amount: Medium Exudate Type: Serosanguineous Exudate Color: red, brown Foul Odor After Cleansing: No Slough/Fibrino Yes Wound Bed Granulation Amount: Small (1-33%) Exposed Structure Granulation Quality: Pink Fascia Exposed: No Necrotic Amount: Large (67-100%) Fat Layer (Subcutaneous Tissue) Exposed: Yes Necrotic Quality: Adherent Slough Tendon Exposed: No Muscle Exposed: No Joint Exposed: No Bone Exposed: No Treatment Notes Wound #1 (Trochanter) Wound Laterality: Right Cleanser Normal Saline Discharge Instruction: Cleanse the wound with Normal Saline prior to applying a clean dressing using gauze sponges, not tissue or cotton balls. Soap and Water Discharge Instruction: May shower and wash wound with dial antibacterial soap and water prior to dressing change. Wound Cleanser Discharge Instruction: Cleanse the wound with wound  cleanser prior to applying a clean dressing using gauze sponges, not tissue or cotton balls. Peri-Wound Care Skin Prep Discharge Instruction: Use skin prep as directed Topical Primary Dressing Santyl Ointment Discharge Instruction: Apply nickel thick amount to wound bed as instructed Secondary Dressing Woven Gauze Sponge, Non-Sterile 4x4 in Discharge Instruction: Apply saline moistened gauze over primary dressing. Zetuvit Plus Silicone Border Dressing 5x5 (in/in) Discharge Instruction: Apply silicone border over primary dressing as directed. Secured With 14M Medipore H Soft Cloth Surgical T ape, 2x2 (in/yd) Discharge Instruction: Secure dressing with tape as directed. Compression Wrap Compression Stockings Add-Ons Electronic Signature(s) Signed: 11/25/2020 11:45:24 AM By: Zandra Abts RN, BSN Entered By: Zandra Abts on 11/25/2020 08:08:26 -------------------------------------------------------------------------------- Vitals Details Patient Name: Date of Service: [name], [name] 11/25/2020 8:00 A M Medical Record Number: [id number] Patient Account Number: [id number] Date of Birth/Sex: Treating RN: 08/17/1940 (80 y.o. Wynelle Link Primary Care Vivan Agostino: [name] Other Clinician: Referring Staisha Winiarski: Treating Niajah Sipos/Extender: Loanne Drilling, Amy Weeks in Treatment: 8 Vital Signs Time Taken: 08:08 Temperature (F): 97.8 Pulse (bpm): 63 Respiratory Rate (breaths/min): 16 Blood Pressure (mmHg): 145/65 Reference Range: 80 - 120 mg / dl Electronic Signature(s) Signed: 11/25/2020 11:45:24 AM By: Zandra Abts RN, BSN Entered By: Zandra Abts on 11/25/2020 08:09:44

## 2020-11-25 NOTE — Progress Notes (Signed)
**Note [name]-Identified via Obfuscation** [name], [name] ([id number]) Visit Report for 11/25/2020 Chief Complaint Document Details Patient Name: Date of Service: [name], [name]. 11/25/2020 8:00 A M Medical Record Number: [id number] Patient Account Number: [id number] Date of Birth/Sex: Treating RN: 03/04/1941 (80 y.o. [name] Primary Care Provider: [name] Other Clinician: Referring Provider: Treating Provider/Extender: Loanne Drilling, Amy Weeks in Treatment: 8 Information Obtained from: Patient Chief Complaint Right trochanter wound Electronic Signature(s) Signed: 11/25/2020 8:52:50 AM By: Geralyn Corwin DO Entered By: Geralyn Corwin on 11/25/2020 08:49:22 -------------------------------------------------------------------------------- Debridement Details Patient Name: Date of Service: [name], [name] 11/25/2020 8:00 A M Medical Record Number: [id number] Patient Account Number: [id number] Date of Birth/Sex: Treating RN: [birth year] (80 y.o. [name] Primary Care Provider: [name] Other Clinician: Referring Provider: Treating Provider/Extender: Loanne Drilling, Amy Weeks in Treatment: 8 Debridement Performed for Assessment: Wound #1 Right Trochanter Performed By: Physician Geralyn Corwin, DO Debridement Type: Debridement Level of Consciousness (Pre-procedure): Awake and Alert Pre-procedure Verification/Time Out Yes - 08:19 Taken: Start Time: 08:20 Pain Control: Other : Benzocaine T Area Debrided (L x W): otal 2.1 (cm) x 1.5 (cm) = 3.15 (cm) Tissue and other material debrided: Non-Viable, Slough, Subcutaneous, Slough Level: Skin/Subcutaneous Tissue Debridement Description: Excisional Instrument: Blade, Curette, Forceps, Scissors Bleeding: Minimum Hemostasis Achieved: Pressure End Time: 08:26 Response to Treatment: Procedure was tolerated well Level of Consciousness (Post- Awake and Alert procedure): Post Debridement Measurements of Total Wound Length: (cm)  2.1 Stage: Unstageable/Unclassified Width: (cm) 1.5 Depth: (cm) 2 Volume: (cm) 4.948 Character of Wound/Ulcer Post Debridement: Stable Post Procedure Diagnosis Same as Pre-procedure Electronic Signature(s) Signed: 11/25/2020 8:52:50 AM By: Geralyn Corwin DO Signed: 11/25/2020 5:36:52 PM By: Antonieta Iba Entered By: Antonieta Iba on 11/25/2020 08:26:25 -------------------------------------------------------------------------------- HPI Details Patient Name: Date of Service: [name], [name] 11/25/2020 8:00 A M Medical Record Number: [id number] Patient Account Number: [id number] Date of Birth/Sex: Treating RN: 1940/09/04 (80 y.o. [name] Primary Care Provider: [name] Other Clinician: Referring Provider: Treating Provider/Extender: Loanne Drilling, Amy Weeks in Treatment: 8 History of Present Illness HPI Description: Admission 6/20 [name] is a 80 year old female with a past medical history of dementia, hypertension that presents to the clinic for a 2-80-month history of right trochanter wound. Daughter is present and helps provide the history. Patient is not sure exactly how this wound occurred but was noticed one day by the daughter a couple of months ago. She has visited urgent care on 2 occasions for this issue. She has been treated with doxycycline and clindamycin. She did have a culture that showed Pseudomonas. She has not received any other antibiotics besides these two antibiotics. She does tend to sleep on her right side mostly. She is not very mobile and sits in a wheelchair most of the day. She reports mild pain to the area. She reports green drainage. She denies increased warmth, erythema or purulent drainage. She did experience a fall 3 months ago and visited the ED for this. She ended up having stitches to her scalp. 6/27; patient presents for 1 week follow-up. She is present with her daughter who helps provide the history. She started using  Santyl daily with dressing changes. She is also aggressively offloading the area. She overall feels well and has no complaints today. She denies signs of infection. 7/12; patient presents for 1 week follow-up. Her daughter is present and helps provide the history. They have been aggressively offloading the area the best they can. She has changed her bed  around to make this happen. She has been using Santyl with wet-to-dry gauze backing. Patient is in better spirits today. She overall feels well and has no complaints today. She denies signs of infection. 8/2; patient presents for 2-week follow-up. Daughter is present and helps provide the history. She has been using Santyl with wet-to-dry backing. There are no issues or complaints today. No systemic signs of infection reported. Overall patient feels well. She continues to offload the area well. 8/15; patient presents for 2-week follow-up. She has no issues or complaints today. She denies signs of infection. Electronic Signature(s) Signed: 11/25/2020 8:52:50 AM By: Geralyn CorwinHoffman, Misty Rago DO Entered By: Geralyn CorwinHoffman, Maleaha Hughett on 11/25/2020 08:49:47 -------------------------------------------------------------------------------- Physical Exam Details Patient Name: Date of Service: [name], [name] 11/25/2020 8:00 A M Medical Record Number: [id number] Patient Account Number: [id number] Date of Birth/Sex: Treating RN: [birth year] (80 y.o. [name]) Barnhart, Jodi Primary Care Provider: Hazle NordmannFargo, Amy Other Clinician: Referring Provider: Treating Provider/Extender: Loanne DrillingHoffman, Wendie Diskin [place], Amy Weeks in Treatment: 8 Constitutional respirations regular, non-labored and within target range for patient.Marland Kitchen. Psychiatric pleasant and cooperative. Notes Right lower extremity: Right trochanter has an open wound with nonviable tissue and subcutaneous fat noted. There is granulation tissue circumferentially. No signs of infection. Electronic Signature(s) Signed: 11/25/2020 8:52:50 AM  By: Geralyn CorwinHoffman, Danielys Madry DO Entered By: Geralyn CorwinHoffman, Nikelle Malatesta on 11/25/2020 08:50:22 -------------------------------------------------------------------------------- Physician Orders Details Patient Name: Date of Service: [name], [name] 11/25/2020 8:00 A M Medical Record Number: [id number] Patient Account Number: [id number] Date of Birth/Sex: Treating RN: [birth year] (80 y.o. [name]) Barnhart, Jodi Primary Care Provider: Hazle NordmannFargo, Amy Other Clinician: Referring Provider: Treating Provider/Extender: Loanne DrillingHoffman, Felicidad Sugarman [place], Amy Weeks in Treatment: 8 Verbal / Phone Orders: No Diagnosis Coding ICD-10 Coding Code Description L89.210 Pressure ulcer of right hip, unstageable G30.1 Alzheimer's disease with late onset Follow-up Appointments ppointment in 2 weeks. - with Dr. Mikey BussingHoffman Return A Bathing/ Shower/ Hygiene May shower and wash wound with soap and water. Off-Loading Other: - Keep off right hip when sleeping Additional Orders / Instructions Follow Nutritious Diet - Increase protein, 100-120g daily. Wound Treatment Wound #1 - Trochanter Wound Laterality: Right Cleanser: Normal Saline (Generic) 1 x Per Day/30 Days Discharge Instructions: Cleanse the wound with Normal Saline prior to applying a clean dressing using gauze sponges, not tissue or cotton balls. Cleanser: Soap and Water 1 x Per Day/30 Days Discharge Instructions: May shower and wash wound with dial antibacterial soap and water prior to dressing change. Cleanser: Wound Cleanser (Generic) 1 x Per Day/30 Days Discharge Instructions: Cleanse the wound with wound cleanser prior to applying a clean dressing using gauze sponges, not tissue or cotton balls. Peri-Wound Care: Skin Prep (Generic) 1 x Per Day/30 Days Discharge Instructions: Use skin prep as directed Prim Dressing: Santyl Ointment 1 x Per Day/30 Days ary Discharge Instructions: Apply nickel thick amount to wound bed as instructed Secondary Dressing: Woven Gauze Sponge,  Non-Sterile 4x4 in (Generic) 1 x Per Day/30 Days Discharge Instructions: Apply saline moistened gauze over primary dressing. Secondary Dressing: Zetuvit Plus Silicone Border Dressing 5x5 (in/in) (Generic) 1 x Per Day/30 Days Discharge Instructions: Apply silicone border over primary dressing as directed. Secured With: 54M Medipore H Soft Cloth Surgical Tape, 2x2 (in/yd) (Generic) 1 x Per Day/30 Days Discharge Instructions: Secure dressing with tape as directed. Electronic Signature(s) Signed: 11/25/2020 8:52:50 AM By: Geralyn CorwinHoffman, Elber Galyean DO Entered By: Geralyn CorwinHoffman, Jhoselin Crume on 11/25/2020 08:50:32 -------------------------------------------------------------------------------- Problem List Details Patient Name: Date of Service: [name], [name] 11/25/2020 8:00 A M Medical Record Number: [id number] Patient  Account Number: [id number] Date of Birth/Sex: Treating RN: 09-09-1940 (80 y.o. [name] Primary Care Provider: [name] Other Clinician: Referring Provider: Treating Provider/Extender: Loanne Drilling, Amy Weeks in Treatment: 8 Active Problems ICD-10 Encounter Code Description Active Date MDM Diagnosis L89.210 Pressure ulcer of right hip, unstageable 09/30/2020 No Yes G30.1 Alzheimer's disease with late onset 09/30/2020 No Yes Inactive Problems Resolved Problems Electronic Signature(s) Signed: 11/25/2020 8:52:50 AM By: Geralyn Corwin DO Previous Signature: 11/25/2020 7:57:09 AM Version By: Antonieta Iba Entered By: Geralyn Corwin on 11/25/2020 08:49:08 -------------------------------------------------------------------------------- Progress Note Details Patient Name: Date of Service: [name], [name] 11/25/2020 8:00 A M Medical Record Number: [id number] Patient Account Number: [id number] Date of Birth/Sex: Treating RN: 04-13-1941 (80 y.o. [name] Primary Care Provider: [name] Other Clinician: Referring Provider: Treating Provider/Extender: Loanne Drilling, Amy Weeks in Treatment: 8 Subjective Chief Complaint Information obtained from Patient Right trochanter wound History of Present Illness (HPI) Admission 6/20 Ms. Meshia Rau is a 80 year old female with a past medical history of dementia, hypertension that presents to the clinic for a 2-78-month history of right trochanter wound. Daughter is present and helps provide the history. Patient is not sure exactly how this wound occurred but was noticed one day by the daughter a couple of months ago. She has visited urgent care on 2 occasions for this issue. She has been treated with doxycycline and clindamycin. She did have a culture that showed Pseudomonas. She has not received any other antibiotics besides these two antibiotics. She does tend to sleep on her right side mostly. She is not very mobile and sits in a wheelchair most of the day. She reports mild pain to the area. She reports green drainage. She denies increased warmth, erythema or purulent drainage. She did experience a fall 3 months ago and visited the ED for this. She ended up having stitches to her scalp. 6/27; patient presents for 1 week follow-up. She is present with her daughter who helps provide the history. She started using Santyl daily with dressing changes. She is also aggressively offloading the area. She overall feels well and has no complaints today. She denies signs of infection. 7/12; patient presents for 1 week follow-up. Her daughter is present and helps provide the history. They have been aggressively offloading the area the best they can. She has changed her bed around to make this happen. She has been using Santyl with wet-to-dry gauze backing. Patient is in better spirits today. She overall feels well and has no complaints today. She denies signs of infection. 8/2; patient presents for 2-week follow-up. Daughter is present and helps provide the history. She has been using Santyl with wet-to-dry  backing. There are no issues or complaints today. No systemic signs of infection reported. Overall patient feels well. She continues to offload the area well. 8/15; patient presents for 2-week follow-up. She has no issues or complaints today. She denies signs of infection. Patient History Unable to Obtain Patient History due to Dementia. Information obtained from Patient. Family History Heart Disease - Mother,Father,Siblings, Hypertension - Mother,Father,Siblings, Lung Disease - Child, No family history of Cancer, Diabetes, Hereditary Spherocytosis, Kidney Disease, Seizures, Stroke, Thyroid Problems, Tuberculosis. Social History Former smoker, Marital Status - Widowed, Alcohol Use - Never, Drug Use - No History, Caffeine Use - Rarely. Medical History Eyes Patient has history of Cataracts - Cataract Surgery Hematologic/Lymphatic Patient has history of Anemia Respiratory Patient has history of Asthma Cardiovascular Patient has history of Hypertension, Peripheral Venous Disease Musculoskeletal  Patient has history of Osteoarthritis Neurologic Patient has history of Dementia Medical A Surgical History Notes nd Gastrointestinal GERD Psychiatric Depression Objective Constitutional respirations regular, non-labored and within target range for patient.. Vitals Time Taken: 8:08 AM, Temperature: 97.8 F, Pulse: 63 bpm, Respiratory Rate: 16 breaths/min, Blood Pressure: 145/65 mmHg. Psychiatric pleasant and cooperative. General Notes: Right lower extremity: Right trochanter has an open wound with nonviable tissue and subcutaneous fat noted. There is granulation tissue circumferentially. No signs of infection. Integumentary (Hair, Skin) Wound #1 status is Open. Original cause of wound was Pressure Injury. The date acquired was: 07/26/2020. The wound has been in treatment 8 weeks. The wound is located on the Right Trochanter. The wound measures 2.1cm length x 1.5cm width x 2cm depth;  2.474cm^2 area and 4.948cm^3 volume. There is Fat Layer (Subcutaneous Tissue) exposed. There is no tunneling noted, however, there is undermining starting at 12:00 and ending at 3:00 with a maximum distance of 1.6cm. There is a medium amount of serosanguineous drainage noted. The wound margin is epibole. There is small (1-33%) pink granulation within the wound bed. There is a large (67-100%) amount of necrotic tissue within the wound bed including Adherent Slough. Assessment Active Problems ICD-10 Pressure ulcer of right hip, unstageable Alzheimer's disease with late onset Patient's wound is overall stable with some signs of improvement. I debrided nonviable tissue in office. We discussed possibly using a wound VAC in the future. For now I recommended continuing Santyl daily with wet-to-dry backing with aggressive offloading. Procedures Wound #1 Pre-procedure diagnosis of Wound #1 is a Pressure Ulcer located on the Right Trochanter . There was a Excisional Skin/Subcutaneous Tissue Debridement with a total area of 3.15 sq cm performed by Geralyn Corwin, DO. With the following instrument(s): Blade, Curette, Forceps, and Scissors to remove Non-Viable tissue/material. Material removed includes Subcutaneous Tissue and Slough and after achieving pain control using Other (Benzocaine). No specimens were taken. A time out was conducted at 08:19, prior to the start of the procedure. A Minimum amount of bleeding was controlled with Pressure. The procedure was tolerated well. Post Debridement Measurements: 2.1cm length x 1.5cm width x 2cm depth; 4.948cm^3 volume. Post debridement Stage noted as Unstageable/Unclassified. Character of Wound/Ulcer Post Debridement is stable. Post procedure Diagnosis Wound #1: Same as Pre-Procedure Plan Follow-up Appointments: Return Appointment in 2 weeks. - with Dr. Mikey Bussing Bathing/ Shower/ Hygiene: May shower and wash wound with soap and water. Off-Loading: Other: -  Keep off right hip when sleeping Additional Orders / Instructions: Follow Nutritious Diet - Increase protein, 100-120g daily. WOUND #1: - Trochanter Wound Laterality: Right Cleanser: Normal Saline (Generic) 1 x Per Day/30 Days Discharge Instructions: Cleanse the wound with Normal Saline prior to applying a clean dressing using gauze sponges, not tissue or cotton balls. Cleanser: Soap and Water 1 x Per Day/30 Days Discharge Instructions: May shower and wash wound with dial antibacterial soap and water prior to dressing change. Cleanser: Wound Cleanser (Generic) 1 x Per Day/30 Days Discharge Instructions: Cleanse the wound with wound cleanser prior to applying a clean dressing using gauze sponges, not tissue or cotton balls. Peri-Wound Care: Skin Prep (Generic) 1 x Per Day/30 Days Discharge Instructions: Use skin prep as directed Prim Dressing: Santyl Ointment 1 x Per Day/30 Days ary Discharge Instructions: Apply nickel thick amount to wound bed as instructed Secondary Dressing: Woven Gauze Sponge, Non-Sterile 4x4 in (Generic) 1 x Per Day/30 Days Discharge Instructions: Apply saline moistened gauze over primary dressing. Secondary Dressing: Zetuvit Plus Silicone Border Dressing 5x5 (  in/in) (Generic) 1 x Per Day/30 Days Discharge Instructions: Apply silicone border over primary dressing as directed. Secured With: 80M Medipore H Soft Cloth Surgical T ape, 2x2 (in/yd) (Generic) 1 x Per Day/30 Days Discharge Instructions: Secure dressing with tape as directed. 1. Santyl daily with wet-to-dry gauze backing 2. In office sharp debridement 3. Aggressive offloading 4. Follow-up in 2 weeks Electronic Signature(s) Signed: 11/25/2020 8:52:50 AM By: Geralyn Corwin DO Entered By: Geralyn Corwin on 11/25/2020 08:52:16 -------------------------------------------------------------------------------- HxROS Details Patient Name: Date of Service: [name], [name] 11/25/2020 8:00 A M Medical Record  Number: [id number] Patient Account Number: [id number] Date of Birth/Sex: Treating RN: [birth year] (80 y.o. [name] Primary Care Provider: [name] Other Clinician: Referring Provider: Treating Provider/Extender: Loanne Drilling, Amy Weeks in Treatment: 8 Unable to Obtain Patient History due to Dementia Information Obtained From Patient Eyes Medical History: Positive for: Cataracts - Cataract Surgery Hematologic/Lymphatic Medical History: Positive for: Anemia Respiratory Medical History: Positive for: Asthma Cardiovascular Medical History: Positive for: Hypertension; Peripheral Venous Disease Gastrointestinal Medical History: Past Medical History Notes: GERD Musculoskeletal Medical History: Positive for: Osteoarthritis Neurologic Medical History: Positive for: Dementia Psychiatric Medical History: Past Medical History Notes: Depression HBO Extended History Items Eyes: Cataracts Immunizations Pneumococcal Vaccine: Received Pneumococcal Vaccination: No Implantable Devices None Family and Social History Cancer: No; Diabetes: No; Heart Disease: Yes - Mother,Father,Siblings; Hereditary Spherocytosis: No; Hypertension: Yes - Mother,Father,Siblings; Kidney Disease: No; Lung Disease: Yes - Child; Seizures: No; Stroke: No; Thyroid Problems: No; Tuberculosis: No; Former smoker; Marital Status - Widowed; Alcohol Use: Never; Drug Use: No History; Caffeine Use: Rarely; Financial Concerns: No; Food, Clothing or Shelter Needs: No; Support System Lacking: No; Transportation Concerns: No Electronic Signature(s) Signed: 11/25/2020 8:52:50 AM By: Geralyn Corwin DO Signed: 11/25/2020 5:36:52 PM By: Antonieta Iba Entered By: Geralyn Corwin on 11/25/2020 08:49:52 -------------------------------------------------------------------------------- SuperBill Details Patient Name: Date of Service: [name], [name] 11/25/2020 Medical Record Number: [id number] Patient  Account Number: [id number] Date of Birth/Sex: Treating RN: 1941/03/26 (80 y.o. [name] Primary Care Provider: [name] Other Clinician: Referring Provider: Treating Provider/Extender: Loanne Drilling, Amy Weeks in Treatment: 8 Diagnosis Coding ICD-10 Codes Code Description [phone] Pressure ulcer of right hip, unstageable G30.1 Alzheimer's disease with late onset Facility Procedures CPT4 Code: [id number] Description: 11042 - DEB SUBQ TISSUE 20 SQ CM/< ICD-10 Diagnosis Description L89.210 Pressure ulcer of right hip, unstageable Modifier: Quantity: 1 Physician Procedures : CPT4 Code Description Modifier [id number] 11042 - WC PHYS SUBQ TISS 20 SQ CM ICD-10 Diagnosis Description L89.210 Pressure ulcer of right hip, unstageable Quantity: 1 Electronic Signature(s) Signed: 11/25/2020 8:52:50 AM By: Geralyn Corwin DO Entered By: Geralyn Corwin on 11/25/2020 08:52:21

## 2020-11-26 ENCOUNTER — Encounter (HOSPITAL_BASED_OUTPATIENT_CLINIC_OR_DEPARTMENT_OTHER): Payer: MEDICARE | Admitting: Internal Medicine

## 2020-12-06 ENCOUNTER — Other Ambulatory Visit: Payer: Self-pay

## 2020-12-06 ENCOUNTER — Encounter (HOSPITAL_BASED_OUTPATIENT_CLINIC_OR_DEPARTMENT_OTHER): Payer: MEDICARE | Admitting: Internal Medicine

## 2020-12-06 DIAGNOSIS — L89210 Pressure ulcer of right hip, unstageable: Secondary | ICD-10-CM | POA: Diagnosis not present

## 2020-12-06 NOTE — Progress Notes (Signed)
[name], LICKLIDER ([id number]) Visit Report for 12/06/2020 Chief Complaint Document Details Patient Name: Date of Service: [place]. 12/06/2020 9:45 A M Medical Record Number: [id number] Patient Account Number: [id number] Date of Birth/Sex: Treating RN: [birth year] (80 y.o. [name] Primary Care Provider: [name] Other Clinician: Referring Provider: Treating Provider/Extender: Loanne Drilling, Amy Weeks in Treatment: 9 Information Obtained from: Patient Chief Complaint Right trochanter wound Electronic Signature(s) Signed: 12/06/2020 11:31:13 AM By: Geralyn Corwin DO Entered By: Geralyn Corwin on 12/06/2020 11:27:19 -------------------------------------------------------------------------------- Debridement Details Patient Name: Date of Service: [name] Person, [name] 12/06/2020 9:45 A M Medical Record Number: [id number] Patient Account Number: [id number] Date of Birth/Sex: Treating RN: Feb 17, 1941 (80 y.o. [name] Primary Care Provider: Coletta Memos, [place] Other Clinician: Referring Provider: Treating Provider/Extender: Loanne Drilling, Amy Weeks in Treatment: 9 Debridement Performed for Assessment: Wound #1 Right Trochanter Performed By: Physician Geralyn Corwin, DO Debridement Type: Debridement Level of Consciousness (Pre-procedure): Awake and Alert Pre-procedure Verification/Time Out Yes - 10:50 Taken: Start Time: 10:50 Pain Control: Other : benzocaine 20% spray T Area Debrided (L x W): otal 1.7 (cm) x 1.2 (cm) = 2.04 (cm) Tissue and other material debrided: Viable, Non-Viable, Slough, Subcutaneous, Slough Level: Skin/Subcutaneous Tissue Debridement Description: Excisional Instrument: Curette Bleeding: Minimum Hemostasis Achieved: Pressure End Time: 10:53 Procedural Pain: 3 Post Procedural Pain: 1 Response to Treatment: Procedure was tolerated well Level of Consciousness (Post- Awake and Alert procedure): Post Debridement  Measurements of Total Wound Length: (cm) 1.7 Stage: Unstageable/Unclassified Width: (cm) 1.2 Depth: (cm) 1.3 Volume: (cm) 2.083 Character of Wound/Ulcer Post Debridement: Requires Further Debridement Post Procedure Diagnosis Same as Pre-procedure Electronic Signature(s) Signed: 12/06/2020 11:31:13 AM By: Geralyn Corwin DO Signed: 12/06/2020 1:08:00 PM By: Zenaida Deed RN, BSN Entered By: Zenaida Deed on 12/06/2020 10:51:52 -------------------------------------------------------------------------------- HPI Details Patient Name: Date of Service: [name] Person, [name] 12/06/2020 9:45 A M Medical Record Number: [id number] Patient Account Number: [id number] Date of Birth/Sex: Treating RN: December 07, 1940 (80 y.o. [name] Primary Care Provider: [name] Other Clinician: Referring Provider: Treating Provider/Extender: Loanne Drilling, Amy Weeks in Treatment: 9 History of Present Illness HPI Description: Admission 6/20 [name] is a 80 year old female with a past medical history of dementia, hypertension that presents to the clinic for a 2-37-month history of right trochanter wound. Daughter is present and helps provide the history. Patient is not sure exactly how this wound occurred but was noticed one day by the daughter a couple of months ago. She has visited urgent care on 2 occasions for this issue. She has been treated with doxycycline and clindamycin. She did have a culture that showed Pseudomonas. She has not received any other antibiotics besides these two antibiotics. She does tend to sleep on her right side mostly. She is not very mobile and sits in a wheelchair most of the day. She reports mild pain to the area. She reports green drainage. She denies increased warmth, erythema or purulent drainage. She did experience a fall 3 months ago and visited the ED for this. She ended up having stitches to her scalp. 6/27; patient presents for 1 week follow-up. She  is present with her daughter who helps provide the history. She started using Santyl daily with dressing changes. She is also aggressively offloading the area. She overall feels well and has no complaints today. She denies signs of infection. 7/12; patient presents for 1 week follow-up. Her daughter is present and helps provide the history. They have been aggressively offloading  the area the best they can. She has changed her bed around to make this happen. She has been using Santyl with wet-to-dry gauze backing. Patient is in better spirits today. She overall feels well and has no complaints today. She denies signs of infection. 8/2; patient presents for 2-week follow-up. Daughter is present and helps provide the history. She has been using Santyl with wet-to-dry backing. There are no issues or complaints today. No systemic signs of infection reported. Overall patient feels well. She continues to offload the area well. 8/15; patient presents for 2-week follow-up. She has no issues or complaints today. She denies signs of infection. 8/26; patient presents for 2-week follow-up. She continues to use Santyl on the wound bed. Daughter is present. There are no issues or complaints today. No signs of infection. Electronic Signature(s) Signed: 12/06/2020 11:31:13 AM By: Geralyn Corwin DO Entered By: Geralyn Corwin on 12/06/2020 11:27:47 -------------------------------------------------------------------------------- Physical Exam Details Patient Name: Date of Service: [name] Person, [name] 12/06/2020 9:45 A M Medical Record Number: [id number] Patient Account Number: [id number] Date of Birth/Sex: Treating RN: 06/16/1940 (80 y.o. [name] Primary Care Provider: [name] Other Clinician: Referring Provider: Treating Provider/Extender: Loanne Drilling, Amy Weeks in Treatment: 9 Constitutional respirations regular, non-labored and within target range for patient.Marland Kitchen Psychiatric pleasant  and cooperative. Notes Right lower extremity: Right trochanter has an open wound with nonviable tissue and slough. There is granulation tissue circumferentially. No signs of infection. Electronic Signature(s) Signed: 12/06/2020 11:31:13 AM By: Geralyn Corwin DO Entered By: Geralyn Corwin on 12/06/2020 11:28:59 -------------------------------------------------------------------------------- Physician Orders Details Patient Name: Date of Service: [name] Person, Henrine W. 12/06/2020 9:45 A M Medical Record Number: [id number] Patient Account Number: [id number] Date of Birth/Sex: Treating RN: 1940-05-20 (80 y.o. [name] Primary Care Provider: [name] Other Clinician: Referring Provider: Treating Provider/Extender: Loanne Drilling, Amy Weeks in Treatment: 9 Verbal / Phone Orders: No Diagnosis Coding ICD-10 Coding Code Description L89.210 Pressure ulcer of right hip, unstageable G30.1 Alzheimer's disease with late onset Follow-up Appointments ppointment in 2 weeks. - with Dr. Mikey Bussing Return A Bathing/ Shower/ Hygiene May shower and wash wound with soap and water. Off-Loading Other: - Keep off right hip when sleeping Additional Orders / Instructions Follow Nutritious Diet - Increase protein, 100-120g daily. Wound Treatment Wound #1 - Trochanter Wound Laterality: Right Cleanser: Normal Saline (Generic) 1 x Per Day/30 Days Discharge Instructions: Cleanse the wound with Normal Saline prior to applying a clean dressing using gauze sponges, not tissue or cotton balls. Cleanser: Soap and Water 1 x Per Day/30 Days Discharge Instructions: May shower and wash wound with dial antibacterial soap and water prior to dressing change. Cleanser: Wound Cleanser (Generic) 1 x Per Day/30 Days Discharge Instructions: Cleanse the wound with wound cleanser prior to applying a clean dressing using gauze sponges, not tissue or cotton balls. Peri-Wound Care: Skin Prep (Generic) 1 x Per  Day/30 Days Discharge Instructions: Use skin prep as directed Prim Dressing: Santyl Ointment 1 x Per Day/30 Days ary Discharge Instructions: Apply nickel thick amount to wound bed as instructed Secondary Dressing: Woven Gauze Sponge, Non-Sterile 4x4 in (Generic) 1 x Per Day/30 Days Discharge Instructions: Apply saline moistened gauze over primary dressing. Secondary Dressing: Zetuvit Plus Silicone Border Dressing 5x5 (in/in) (Generic) 1 x Per Day/30 Days Discharge Instructions: Apply silicone border over primary dressing as directed. Patient Medications llergies: Effexor A Notifications Medication Indication Start End 12/06/2020 Santyl DOSE 1 - topical 250 unit/gram ointment - 1 application daily for 30 days  Electronic Signature(s) Signed: 12/06/2020 11:59:34 AM By: Geralyn Corwin DO Previous Signature: 12/06/2020 11:31:13 AM Version By: Geralyn Corwin DO Entered By: Geralyn Corwin on 12/06/2020 11:59:34 -------------------------------------------------------------------------------- Problem List Details Patient Name: Date of Service: [name] Person, Jonnelle W. 12/06/2020 9:45 A M Medical Record Number: [id number] Patient Account Number: [id number] Date of Birth/Sex: Treating RN: [birth year] (80 y.o. [name] Primary Care Provider: [name] Other Clinician: Referring Provider: Treating Provider/Extender: Loanne Drilling, Amy Weeks in Treatment: 9 Active Problems ICD-10 Encounter Code Description Active Date MDM Diagnosis L89.210 Pressure ulcer of right hip, unstageable 09/30/2020 No Yes G30.1 Alzheimer's disease with late onset 09/30/2020 No Yes Inactive Problems Resolved Problems Electronic Signature(s) Signed: 12/06/2020 11:31:13 AM By: Geralyn Corwin DO Entered By: Geralyn Corwin on 12/06/2020 11:27:07 -------------------------------------------------------------------------------- Progress Note Details Patient Name: Date of Service: [name] Person, [name] W.  12/06/2020 9:45 A M Medical Record Number: [id number] Patient Account Number: [id number] Date of Birth/Sex: Treating RN: 1941/01/31 (80 y.o. [name] Primary Care Provider: [name] Other Clinician: Referring Provider: Treating Provider/Extender: Loanne Drilling, Amy Weeks in Treatment: 9 Subjective Chief Complaint Information obtained from Patient Right trochanter wound History of Present Illness (HPI) Admission 6/20 Ms. Shemicka Cohrs is a 80 year old female with a past medical history of dementia, hypertension that presents to the clinic for a 2-57-month history of right trochanter wound. Daughter is present and helps provide the history. Patient is not sure exactly how this wound occurred but was noticed one day by the daughter a couple of months ago. She has visited urgent care on 2 occasions for this issue. She has been treated with doxycycline and clindamycin. She did have a culture that showed Pseudomonas. She has not received any other antibiotics besides these two antibiotics. She does tend to sleep on her right side mostly. She is not very mobile and sits in a wheelchair most of the day. She reports mild pain to the area. She reports green drainage. She denies increased warmth, erythema or purulent drainage. She did experience a fall 3 months ago and visited the ED for this. She ended up having stitches to her scalp. 6/27; patient presents for 1 week follow-up. She is present with her daughter who helps provide the history. She started using Santyl daily with dressing changes. She is also aggressively offloading the area. She overall feels well and has no complaints today. She denies signs of infection. 7/12; patient presents for 1 week follow-up. Her daughter is present and helps provide the history. They have been aggressively offloading the area the best they can. She has changed her bed around to make this happen. She has been using Santyl with wet-to-dry gauze  backing. Patient is in better spirits today. She overall feels well and has no complaints today. She denies signs of infection. 8/2; patient presents for 2-week follow-up. Daughter is present and helps provide the history. She has been using Santyl with wet-to-dry backing. There are no issues or complaints today. No systemic signs of infection reported. Overall patient feels well. She continues to offload the area well. 8/15; patient presents for 2-week follow-up. She has no issues or complaints today. She denies signs of infection. 8/26; patient presents for 2-week follow-up. She continues to use Santyl on the wound bed. Daughter is present. There are no issues or complaints today. No signs of infection. Patient History Unable to Obtain Patient History due to Dementia. Information obtained from Patient. Family History Heart Disease - Mother,Father,Siblings, Hypertension - Mother,Father,Siblings, Lung Disease - Child,  No family history of Cancer, Diabetes, Hereditary Spherocytosis, Kidney Disease, Seizures, Stroke, Thyroid Problems, Tuberculosis. Social History Former smoker, Marital Status - Widowed, Alcohol Use - Never, Drug Use - No History, Caffeine Use - Rarely. Medical History Eyes Patient has history of Cataracts - Cataract Surgery Hematologic/Lymphatic Patient has history of Anemia Respiratory Patient has history of Asthma Cardiovascular Patient has history of Hypertension, Peripheral Venous Disease Musculoskeletal Patient has history of Osteoarthritis Neurologic Patient has history of Dementia Medical A Surgical History Notes nd Gastrointestinal GERD Psychiatric Depression Objective Constitutional respirations regular, non-labored and within target range for patient.. Vitals Time Taken: 9:56 AM, Temperature: 98.5 F, Pulse: 74 bpm, Respiratory Rate: 17 breaths/min, Blood Pressure: 134/74 mmHg. Psychiatric pleasant and cooperative. General Notes: Right lower  extremity: Right trochanter has an open wound with nonviable tissue and slough. There is granulation tissue circumferentially. No signs of infection. Integumentary (Hair, Skin) Wound #1 status is Open. Original cause of wound was Pressure Injury. The date acquired was: 07/26/2020. The wound has been in treatment 9 weeks. The wound is located on the Right Trochanter. The wound measures 1.7cm length x 1.2cm width x 1.3cm depth; 1.602cm^2 area and 2.083cm^3 volume. There is Fat Layer (Subcutaneous Tissue) exposed. There is no tunneling noted, however, there is undermining starting at 12:00 and ending at 12:00 with a maximum distance of 1.8cm. There is a medium amount of serosanguineous drainage noted. The wound margin is epibole. There is small (1-33%) pink granulation within the wound bed. There is a large (67-100%) amount of necrotic tissue within the wound bed including Adherent Slough. Assessment Active Problems ICD-10 Pressure ulcer of right hip, unstageable Alzheimer's disease with late onset Patient presents for 2-week follow-up. The wound is overall stable. There is still slough throughout and I tried to debride some of this in office. I recommended continuing Santyl to the area. There are no signs of infection. Until the slough is removed we still do not know the depth and daughter is understanding of this. Procedures Wound #1 Pre-procedure diagnosis of Wound #1 is a Pressure Ulcer located on the Right Trochanter . There was a Excisional Skin/Subcutaneous Tissue Debridement with a total area of 2.04 sq cm performed by Geralyn CorwinHoffman, Muzammil Bruins, DO. With the following instrument(s): Curette to remove Viable and Non-Viable tissue/material. Material removed includes Subcutaneous Tissue and Slough and after achieving pain control using Other (benzocaine 20% spray). No specimens were taken. A time out was conducted at 10:50, prior to the start of the procedure. A Minimum amount of bleeding was controlled  with Pressure. The procedure was tolerated well with a pain level of 3 throughout and a pain level of 1 following the procedure. Post Debridement Measurements: 1.7cm length x 1.2cm width x 1.3cm depth; 2.083cm^3 volume. Post debridement Stage noted as Unstageable/Unclassified. Character of Wound/Ulcer Post Debridement requires further debridement. Post procedure Diagnosis Wound #1: Same as Pre-Procedure Plan Follow-up Appointments: Return Appointment in 2 weeks. - with Dr. Mikey BussingHoffman Bathing/ Shower/ Hygiene: May shower and wash wound with soap and water. Off-Loading: Other: - Keep off right hip when sleeping Additional Orders / Instructions: Follow Nutritious Diet - Increase protein, 100-120g daily. WOUND #1: - Trochanter Wound Laterality: Right Cleanser: Normal Saline (Generic) 1 x Per Day/30 Days Discharge Instructions: Cleanse the wound with Normal Saline prior to applying a clean dressing using gauze sponges, not tissue or cotton balls. Cleanser: Soap and Water 1 x Per Day/30 Days Discharge Instructions: May shower and wash wound with dial antibacterial soap and water prior to dressing  change. Cleanser: Wound Cleanser (Generic) 1 x Per Day/30 Days Discharge Instructions: Cleanse the wound with wound cleanser prior to applying a clean dressing using gauze sponges, not tissue or cotton balls. Peri-Wound Care: Skin Prep (Generic) 1 x Per Day/30 Days Discharge Instructions: Use skin prep as directed Prim Dressing: Santyl Ointment 1 x Per Day/30 Days ary Discharge Instructions: Apply nickel thick amount to wound bed as instructed Secondary Dressing: Woven Gauze Sponge, Non-Sterile 4x4 in (Generic) 1 x Per Day/30 Days Discharge Instructions: Apply saline moistened gauze over primary dressing. Secondary Dressing: Zetuvit Plus Silicone Border Dressing 5x5 (in/in) (Generic) 1 x Per Day/30 Days Discharge Instructions: Apply silicone border over primary dressing as directed. 1. Santyl 2.  Follow-up in 2 weeks 3. In office sharp debridement Electronic Signature(s) Signed: 12/06/2020 11:31:13 AM By: Geralyn Corwin DO Entered By: Geralyn Corwin on 12/06/2020 11:30:32 -------------------------------------------------------------------------------- HxROS Details Patient Name: Date of Service: [name], Aminat W. 12/06/2020 9:45 A M Medical Record Number: [id number] Patient Account Number: [id number] Date of Birth/Sex: Treating RN: 12-30-1940 (80 y.o. [name] Primary Care Provider: Other Clinician: [name] Referring Provider: Treating Provider/Extender: Loanne Drilling, Amy Weeks in Treatment: 9 Unable to Obtain Patient History due to Dementia Information Obtained From Patient Eyes Medical History: Positive for: Cataracts - Cataract Surgery Hematologic/Lymphatic Medical History: Positive for: Anemia Respiratory Medical History: Positive for: Asthma Cardiovascular Medical History: Positive for: Hypertension; Peripheral Venous Disease Gastrointestinal Medical History: Past Medical History Notes: GERD Musculoskeletal Medical History: Positive for: Osteoarthritis Neurologic Medical History: Positive for: Dementia Psychiatric Medical History: Past Medical History Notes: Depression HBO Extended History Items Eyes: Cataracts Immunizations Pneumococcal Vaccine: Received Pneumococcal Vaccination: No Implantable Devices None Family and Social History Cancer: No; Diabetes: No; Heart Disease: Yes - Mother,Father,Siblings; Hereditary Spherocytosis: No; Hypertension: Yes - Mother,Father,Siblings; Kidney Disease: No; Lung Disease: Yes - Child; Seizures: No; Stroke: No; Thyroid Problems: No; Tuberculosis: No; Former smoker; Marital Status - Widowed; Alcohol Use: Never; Drug Use: No History; Caffeine Use: Rarely; Financial Concerns: No; Food, Clothing or Shelter Needs: No; Support System Lacking: No; Transportation Concerns: No Electronic  Signature(s) Signed: 12/06/2020 11:31:13 AM By: Geralyn Corwin DO Signed: 12/06/2020 1:08:00 PM By: Zenaida Deed RN, BSN Entered By: Geralyn Corwin on 12/06/2020 11:27:54 -------------------------------------------------------------------------------- SuperBill Details Patient Name: Date of Service: [name] Person, Sharrell W. 12/06/2020 Medical Record Number: [id number] Patient Account Number: [id number] Date of Birth/Sex: Treating RN: 30-Aug-1940 (80 y.o. [name] Primary Care Provider: [name] Other Clinician: Referring Provider: Treating Provider/Extender: Loanne Drilling, Amy Weeks in Treatment: 9 Diagnosis Coding ICD-10 Codes Code Description L89.210 Pressure ulcer of right hip, unstageable G30.1 Alzheimer's disease with late onset Facility Procedures CPT4 Code: [id number] Description: 11042 - DEB SUBQ TISSUE 20 SQ CM/< ICD-10 Diagnosis Description L89.210 Pressure ulcer of right hip, unstageable Modifier: Quantity: 1 Physician Procedures : CPT4 Code Description Modifier [id number] 11042 - WC PHYS SUBQ TISS 20 SQ CM ICD-10 Diagnosis Description L89.210 Pressure ulcer of right hip, unstageable Quantity: 1 Electronic Signature(s) Signed: 12/06/2020 11:31:13 AM By: Geralyn Corwin DO Entered By: Geralyn Corwin on 12/06/2020 11:30:40

## 2020-12-12 ENCOUNTER — Encounter (HOSPITAL_BASED_OUTPATIENT_CLINIC_OR_DEPARTMENT_OTHER): Payer: MEDICARE | Admitting: Internal Medicine

## 2020-12-12 NOTE — Progress Notes (Signed)
[name], [name] ([id number]) Visit Report for 12/06/2020 Arrival Information Details Patient Name: Date of Service: Patrick. 12/06/2020 9:45 A M Medical Record Number: [id number] Patient Account Number: [id number] Date of Birth/Sex: Treating RN: 06/27/1940 (80 y.o. [name], [name] Primary Care Zoeya Gramajo: [name] Other Clinician: Referring Brunetta Newingham: Treating Kalman Nylen/Extender: Loanne Drilling, Amy Weeks in Treatment: 9 Visit Information History Since Last Visit Added or deleted any medications: No Patient Arrived: Wheel Chair Any new allergies or adverse reactions: No Arrival Time: 09:55 Had a fall or experienced change in No Accompanied By: daughter activities of daily living that may affect Transfer Assistance: Manual risk of falls: Patient Identification Verified: Yes Signs or symptoms of abuse/neglect since last visito No Secondary Verification Process Completed: Yes Hospitalized since last visit: No Patient Requires Transmission-Based Precautions: No Implantable device outside of the clinic excluding No Patient Has Alerts: Yes cellular tissue based products placed in the center Patient Alerts: Patient on Blood Thinner since last visit: Has Dressing in Place as Prescribed: Yes Pain Present Now: No Electronic Signature(s) Signed: 12/10/2020 5:24:19 PM By: Fonnie Mu RN Entered By: Fonnie Mu on 12/06/2020 09:56:28 -------------------------------------------------------------------------------- Encounter Discharge Information Details Patient Name: Date of Service: [name], [name] 12/06/2020 9:45 A M Medical Record Number: [id number] Patient Account Number: [id number] Date of Birth/Sex: Treating RN: 19-Mar-1941 (80 y.o. [name], [name] Primary Care Nhung Danko: [name] Other Clinician: Referring Jaylen Claude: Treating Anders Hohmann/Extender: Loanne Drilling, Amy Weeks in Treatment: 9 Encounter Discharge Information Items Post Procedure  Vitals Discharge Condition: Stable Temperature (F): 98.7 Ambulatory Status: Wheelchair Pulse (bpm): 74 Discharge Destination: Home Respiratory Rate (breaths/min): 17 Transportation: Private Auto Blood Pressure (mmHg): 110/70 Accompanied By: daughter Schedule Follow-up Appointment: Yes Clinical Summary of Care: Electronic Signature(s) Signed: 12/10/2020 5:24:19 PM By: Fonnie Mu RN Entered By: Fonnie Mu on 12/06/2020 11:40:28 -------------------------------------------------------------------------------- Lower Extremity Assessment Details Patient Name: Date of Service: [name], [name] 12/06/2020 9:45 A M Medical Record Number: [id number] Patient Account Number: [id number] Date of Birth/Sex: Treating RN: 05-Dec-1940 (80 y.o. [name] Primary Care Evely Gainey: [name] Other Clinician: Referring Clova Morlock: Treating Katina Remick/Extender: Loanne Drilling, Amy Weeks in Treatment: 9 Electronic Signature(s) Signed: 12/10/2020 5:24:19 PM By: Fonnie Mu RN Entered By: Fonnie Mu on 12/06/2020 10:04:14 -------------------------------------------------------------------------------- Multi Wound Chart Details Patient Name: Date of Service: [name], [name] 12/06/2020 9:45 A M Medical Record Number: [id number] Patient Account Number: [id number] Date of Birth/Sex: Treating RN: 05/06/1940 (80 y.o. [name] Primary Care Gwyndolyn Guilford: [name] Other Clinician: Referring Neyla Gauntt: Treating Tyreona Panjwani/Extender: Loanne Drilling, Amy Weeks in Treatment: 9 Vital Signs Height(in): Pulse(bpm): 74 Weight(lbs): Blood Pressure(mmHg): 134/74 Body Mass Index(BMI): Temperature(F): 98.5 Respiratory Rate(breaths/min): 17 Photos: [N/A:N/A] Right Trochanter N/A N/A Wound Location: Pressure Injury N/A N/A Wounding Event: Pressure Ulcer N/A N/A Primary Etiology: Cataracts, Anemia, Asthma, N/A N/A Comorbid History: Hypertension,  Peripheral Venous Disease, Osteoarthritis, Dementia 07/26/2020 N/A N/A Date Acquired: 9 N/A N/A Weeks of Treatment: Open N/A N/A Wound Status: 1.7x1.2x1.3 N/A N/A Measurements L x W x D (cm) 1.602 N/A N/A A (cm) : rea 2.083 N/A N/A Volume (cm) : 86.60% N/A N/A % Reduction in A rea: 41.80% N/A N/A % Reduction in Volume: 12 Starting Position 1 (o'clock): 12 Ending Position 1 (o'clock): 1.8 Maximum Distance 1 (cm): Yes N/A N/A Undermining: Unstageable/Unclassified N/A N/A Classification: Medium N/A N/A Exudate A mount: Serosanguineous N/A N/A Exudate Type: red, brown N/A N/A Exudate Color: Epibole N/A N/A Wound Margin: Small (1-33%) N/A N/A Granulation Amount:  Pink N/A N/A Granulation Quality: Large (67-100%) N/A N/A Necrotic Amount: Fat Layer (Subcutaneous Tissue): Yes N/A N/A Exposed Structures: Fascia: No Tendon: No Muscle: No Joint: No Bone: No Small (1-33%) N/A N/A Epithelialization: Debridement - Excisional N/A N/A Debridement: Pre-procedure Verification/Time Out 10:50 N/A N/A Taken: Other N/A N/A Pain Control: Subcutaneous, Slough N/A N/A Tissue Debrided: Skin/Subcutaneous Tissue N/A N/A Level: 2.04 N/A N/A Debridement A (sq cm): rea Curette N/A N/A Instrument: Minimum N/A N/A Bleeding: Pressure N/A N/A Hemostasis A chieved: 3 N/A N/A Procedural Pain: 1 N/A N/A Post Procedural Pain: Procedure was tolerated well N/A N/A Debridement Treatment Response: 1.7x1.2x1.3 N/A N/A Post Debridement Measurements L x W x D (cm) 2.083 N/A N/A Post Debridement Volume: (cm) Unstageable/Unclassified N/A N/A Post Debridement Stage: Debridement N/A N/A Procedures Performed: Treatment Notes Electronic Signature(s) Signed: 12/06/2020 11:31:13 AM By: Geralyn Corwin DO Signed: 12/06/2020 1:08:00 PM By: Zenaida Deed RN, BSN Entered By: Geralyn Corwin on 12/06/2020  11:27:12 -------------------------------------------------------------------------------- Multi-Disciplinary Care Plan Details Patient Name: Date of Service: [name], [name] 12/06/2020 9:45 A M Medical Record Number: [id number] Patient Account Number: [id number] Date of Birth/Sex: Treating RN: Jun 30, 1940 (80 y.o. [name] Primary Care Lorri Fukuhara: [name] Other Clinician: Referring Waunetta Riggle: Treating Mirka Barbone/Extender: Loanne Drilling, Amy Weeks in Treatment: 9 Active Inactive Pressure Nursing Diagnoses: Knowledge deficit related to causes and risk factors for pressure ulcer development Knowledge deficit related to management of pressures ulcers Goals: Patient will remain free from development of additional pressure ulcers Date Initiated: 09/30/2020 Target Resolution Date: 12/13/2020 Goal Status: Active Patient/caregiver will verbalize understanding of pressure ulcer management Date Initiated: 09/30/2020 Target Resolution Date: 12/13/2020 Goal Status: Active Interventions: Assess: immobility, friction, shearing, incontinence upon admission and as needed Assess offloading mechanisms upon admission and as needed Assess potential for pressure ulcer upon admission and as needed Provide education on pressure ulcers Notes: Wound/Skin Impairment Nursing Diagnoses: Impaired tissue integrity Goals: Patient/caregiver will verbalize understanding of skin care regimen Date Initiated: 09/30/2020 Target Resolution Date: 12/13/2020 Goal Status: Active Ulcer/skin breakdown will have a volume reduction of 30% by week 4 Date Initiated: 09/30/2020 Target Resolution Date: 12/13/2020 Goal Status: Active Interventions: Assess patient/caregiver ability to obtain necessary supplies Assess patient/caregiver ability to perform ulcer/skin care regimen upon admission and as needed Assess ulceration(s) every visit Provide education on ulcer and skin care Treatment Activities: Skin care  regimen initiated : 09/30/2020 Topical wound management initiated : 09/30/2020 Notes: Electronic Signature(s) Signed: 12/06/2020 1:08:00 PM By: Zenaida Deed RN, BSN Entered By: Zenaida Deed on 12/06/2020 10:24:46 -------------------------------------------------------------------------------- Pain Assessment Details Patient Name: Date of Service: [name], [name] 12/06/2020 9:45 A M Medical Record Number: [id number] Patient Account Number: [id number] Date of Birth/Sex: Treating RN: [birth year] (80 y.o. [name] Primary Care Smokey Melott: [name] Other Clinician: Referring Matai Carpenito: Treating Donte Lenzo/Extender: Loanne Drilling, Amy Weeks in Treatment: 9 Active Problems Location of Pain Severity and Description of Pain Patient Has Paino No Site Locations Pain Management and Medication Current Pain Management: Electronic Signature(s) Signed: 12/10/2020 5:24:19 PM By: Fonnie Mu RN Entered By: Fonnie Mu on 12/06/2020 10:04:06 -------------------------------------------------------------------------------- Patient/Caregiver Education Details Patient Name: Date of Service: [name], [name] 8/26/2022andnbsp9:45 A M Medical Record Number: [id number] Patient Account Number: [id number] Date of Birth/Gender: Treating RN: [birth year] (80 y.o. [name] Primary Care Physician: [name] Other Clinician: Referring Physician: Treating Physician/Extender: Earl Many Weeks in Treatment: 9 Education Assessment Education Provided To: Patient Education Topics Provided Pressure: Methods: Explain/Verbal Responses: Reinforcements needed, State content  correctly Wound/Skin Impairment: Methods: Explain/Verbal Responses: Reinforcements needed, State content correctly Electronic Signature(s) Signed: 12/06/2020 1:08:00 PM By: Zenaida Deed RN, BSN Entered By: Zenaida Deed on 12/06/2020  10:25:09 -------------------------------------------------------------------------------- Wound Assessment Details Patient Name: Date of Service: [name], [name] 12/06/2020 9:45 A M Medical Record Number: [id number] Patient Account Number: [id number] Date of Birth/Sex: Treating RN: 11/15/1940 (80 y.o. [name], [name] Primary Care Sheron Robin: [name] Other Clinician: Referring Jaren Vanetten: Treating Abrina Petz/Extender: Loanne Drilling, Amy Weeks in Treatment: 9 Wound Status Wound Number: 1 Primary Pressure Ulcer Etiology: Wound Location: Right Trochanter Wound Open Wounding Event: Pressure Injury Status: Date Acquired: 07/26/2020 Comorbid Cataracts, Anemia, Asthma, Hypertension, Peripheral Venous Weeks Of Treatment: 9 History: Disease, Osteoarthritis, Dementia Clustered Wound: No Photos Wound Measurements Length: (cm) 1.7 Width: (cm) 1.2 Depth: (cm) 1.3 Area: (cm) 1.602 Volume: (cm) 2.083 % Reduction in Area: 86.6% % Reduction in Volume: 41.8% Epithelialization: Small (1-33%) Tunneling: No Undermining: Yes Starting Position (o'clock): 12 Ending Position (o'clock): 12 Maximum Distance: (cm) 1.8 Wound Description Classification: Unstageable/Unclassified Wound Margin: Epibole Exudate Amount: Medium Exudate Type: Serosanguineous Exudate Color: red, brown Foul Odor After Cleansing: No Slough/Fibrino Yes Wound Bed Granulation Amount: Small (1-33%) Exposed Structure Granulation Quality: Pink Fascia Exposed: No Necrotic Amount: Large (67-100%) Fat Layer (Subcutaneous Tissue) Exposed: Yes Necrotic Quality: Adherent Slough Tendon Exposed: No Muscle Exposed: No Joint Exposed: No Bone Exposed: No Treatment Notes Wound #1 (Trochanter) Wound Laterality: Right Cleanser Normal Saline Discharge Instruction: Cleanse the wound with Normal Saline prior to applying a clean dressing using gauze sponges, not tissue or cotton balls. Soap and Water Discharge  Instruction: May shower and wash wound with dial antibacterial soap and water prior to dressing change. Wound Cleanser Discharge Instruction: Cleanse the wound with wound cleanser prior to applying a clean dressing using gauze sponges, not tissue or cotton balls. Peri-Wound Care Skin Prep Discharge Instruction: Use skin prep as directed Topical Primary Dressing Santyl Ointment Discharge Instruction: Apply nickel thick amount to wound bed as instructed Secondary Dressing Woven Gauze Sponge, Non-Sterile 4x4 in Discharge Instruction: Apply saline moistened gauze over primary dressing. Zetuvit Plus Silicone Border Dressing 5x5 (in/in) Discharge Instruction: Apply silicone border over primary dressing as directed. Secured With Compression Wrap Compression Stockings Facilities manager) Signed: 12/10/2020 5:24:19 PM By: Fonnie Mu RN Signed: 12/12/2020 5:19:32 PM By: Zandra Abts RN, BSN Entered By: Zandra Abts on 12/06/2020 10:13:34 -------------------------------------------------------------------------------- Vitals Details Patient Name: Date of Service: [name], [name] 12/06/2020 9:45 A M Medical Record Number: [id number] Patient Account Number: [id number] Date of Birth/Sex: Treating RN: 11-Jul-1940 (80 y.o. [name], [name] Primary Care Joshual Terrio: [name] Other Clinician: Referring Chantele Corado: Treating Daxton Nydam/Extender: Loanne Drilling, Amy Weeks in Treatment: 9 Vital Signs Time Taken: 09:56 Temperature (F): 98.5 Pulse (bpm): 74 Respiratory Rate (breaths/min): 17 Blood Pressure (mmHg): 134/74 Reference Range: 80 - 120 mg / dl Electronic Signature(s) Signed: 12/10/2020 5:24:19 PM By: Fonnie Mu RN Entered By: Fonnie Mu on 12/06/2020 10:03:59

## 2020-12-19 ENCOUNTER — Other Ambulatory Visit: Payer: Self-pay

## 2020-12-19 ENCOUNTER — Encounter (HOSPITAL_BASED_OUTPATIENT_CLINIC_OR_DEPARTMENT_OTHER): Payer: MEDICARE | Attending: Internal Medicine | Admitting: Internal Medicine

## 2020-12-19 DIAGNOSIS — F0280 Dementia in other diseases classified elsewhere without behavioral disturbance: Secondary | ICD-10-CM | POA: Insufficient documentation

## 2020-12-19 DIAGNOSIS — G301 Alzheimer's disease with late onset: Secondary | ICD-10-CM | POA: Insufficient documentation

## 2020-12-19 DIAGNOSIS — Z87891 Personal history of nicotine dependence: Secondary | ICD-10-CM | POA: Diagnosis not present

## 2020-12-19 DIAGNOSIS — Z8249 Family history of ischemic heart disease and other diseases of the circulatory system: Secondary | ICD-10-CM | POA: Diagnosis not present

## 2020-12-19 DIAGNOSIS — L89210 Pressure ulcer of right hip, unstageable: Secondary | ICD-10-CM | POA: Diagnosis present

## 2020-12-19 DIAGNOSIS — I10 Essential (primary) hypertension: Secondary | ICD-10-CM | POA: Diagnosis not present

## 2020-12-19 NOTE — Progress Notes (Signed)
[name], [name] ([id number]) Visit Report for 12/19/2020 Chief Complaint Document Details Patient Name: Date of Service: [name], [name]. 12/19/2020 3:30 PM Medical Record Number: [id number] Patient Account Number: [id number] Date of Birth/Sex: Treating RN: 09-24-1940 (80 y.o. [name], [name] Primary Care Provider: Hazle [name] Other Clinician: Haywood [name] Referring Provider: Treating Provider/Extender: [name], [name] in Treatment: 11 Information Obtained from: Patient Chief Complaint Right trochanter wound Electronic Signature(s) Signed: 12/19/2020 5:48:26 PM By: [name] Corwin DO Entered By: [name] on 12/19/2020 17:45:07 -------------------------------------------------------------------------------- HPI Details Patient Name: Date of Service: [name] DE, [name] 12/19/2020 3:30 PM Medical Record Number: [id number] Patient Account Number: [id number] Date of Birth/Sex: Treating RN: Aug 19, 1940 (80 y.o. [name] Primary Care Provider: Hazle [name] Other Clinician: Haywood [name] Referring Provider: Treating Provider/Extender: [name], [name] in Treatment: 11 History of Present Illness HPI Description: Admission 6/20 [name] is a 80 year old female with a past medical history of dementia, hypertension that presents to the clinic for a 2-23-month history of right trochanter wound. Daughter is present and helps provide the history. Patient is not sure exactly how this wound occurred but was noticed one day by the daughter a couple of months ago. She has visited urgent care on 2 occasions for this issue. She has been treated with doxycycline and clindamycin. She did have a culture that showed Pseudomonas. She has not received any other antibiotics besides these two antibiotics. She does tend to sleep on her right side mostly. She is not very mobile and sits in a wheelchair most of the day. She reports mild pain to the  area. She reports green drainage. She denies increased warmth, erythema or purulent drainage. She did experience a fall 3 months ago and visited the ED for this. She ended up having stitches to her scalp. 6/27; patient presents for 1 week follow-up. She is present with her daughter who helps provide the history. She started using Santyl daily with dressing changes. She is also aggressively offloading the area. She overall feels well and has no complaints today. She denies signs of infection. 7/12; patient presents for 1 week follow-up. Her daughter is present and helps provide the history. They have been aggressively offloading the area the best they can. She has changed her bed around to make this happen. She has been using Santyl with wet-to-dry gauze backing. Patient is in better spirits today. She overall feels well and has no complaints today. She denies signs of infection. 8/2; patient presents for 2-week follow-up. Daughter is present and helps provide the history. She has been using Santyl with wet-to-dry backing. There are no issues or complaints today. No systemic signs of infection reported. Overall patient feels well. She continues to offload the area well. 8/15; patient presents for 2-week follow-up. She has no issues or complaints today. She denies signs of infection. 8/26; patient presents for 2-week follow-up. She continues to use Santyl on the wound bed. Daughter is present. There are no issues or complaints today. No signs of infection. 9/8; patient presents for 2-week follow-up. She has been using Santyl on the wound bed. There are no issues or complaints today. She denies systemic signs of infection. Electronic Signature(s) Signed: 12/19/2020 5:48:26 PM By: [name] Corwin DO Signed: 12/19/2020 5:48:26 PM By: [name] Corwin DO Entered By: [name] on 12/19/2020 17:45:32 -------------------------------------------------------------------------------- Physical Exam  Details Patient Name: Date of Service: [name], [name] 12/19/2020 3:30 PM Medical Record Number: [id number] Patient Account Number: [id number] Date  of Birth/Sex: Treating RN: 08-16-1940 (80 y.o. [name] Primary Care Provider: Hazle [name] Other Clinician: Haywood [name] Referring Provider: Treating Provider/Extender: [name], [name] in Treatment: 11 Constitutional respirations regular, non-labored and within target range for patient.Marland Kitchen Psychiatric pleasant and cooperative. Notes Right lower extremity: Right trochanter has an open wound that has increased depth compared to last clinic visit. Less slough is noted. There is yellow drainage. No signs of soft tissue infection. Electronic Signature(s) Signed: 12/19/2020 5:48:26 PM By: [name] Corwin DO Entered By: [name] on 12/19/2020 17:46:27 -------------------------------------------------------------------------------- Physician Orders Details Patient Name: Date of Service: [name], [name] 12/19/2020 3:30 PM Medical Record Number: [id number] Patient Account Number: [id number] Date of Birth/Sex: Treating RN: May 16, 1940 (80 y.o. [name], [name] Primary Care Provider: Hazle [name] Other Clinician: Haywood [name] Referring Provider: Treating Provider/Extender: [name], [name] in Treatment: 70 Verbal / Phone Orders: No Diagnosis Coding ICD-10 Coding Code Description L89.210 Pressure ulcer of right hip, unstageable G30.1 Alzheimer's disease with late onset Follow-up Appointments ppointment in 1 week. - [name] Return A Bathing/ Shower/ Hygiene May shower and wash wound with soap and water. Off-Loading Other: - Keep off right hip when sleeping Additional Orders / Instructions Follow Nutritious Diet - Increase protein, 100-120g daily. Wound Treatment Wound #1 - Trochanter Wound Laterality: Right Cleanser: Normal Saline (DME) (Generic) 1 x Per Day/15  Days Discharge Instructions: Cleanse the wound with Normal Saline prior to applying a clean dressing using gauze sponges, not tissue or cotton balls. Cleanser: Soap and Water 1 x Per Day/15 Days Discharge Instructions: May shower and wash wound with dial antibacterial soap and water prior to dressing change. Cleanser: Wound Cleanser (Generic) 1 x Per Day/15 Days Discharge Instructions: Cleanse the wound with wound cleanser prior to applying a clean dressing using gauze sponges, not tissue or cotton balls. Peri-Wound Care: Skin Prep (Generic) 1 x Per Day/15 Days Discharge Instructions: Use skin prep as directed Prim Dressing: Santyl Ointment 1 x Per Day/15 Days ary Discharge Instructions: Apply nickel thick amount to wound bed as instructed Prim Dressing: Iodoform packing strip 1/4 (in) (DME) (Generic) 1 x Per Day/15 Days ary Discharge Instructions: Lightly pack as instructed Secondary Dressing: Woven Gauze Sponge, Non-Sterile 4x4 in (DME) (Generic) 1 x Per Day/15 Days Discharge Instructions: Apply saline moistened gauze over primary dressing. Secondary Dressing: Zetuvit Plus Silicone Border Dressing 5x5 (in/in) (DME) (Generic) 1 x Per Day/15 Days Discharge Instructions: Apply silicone border over primary dressing as directed. Laboratory naerobe culture (MICRO) Bacteria identified in Unspecified specimen by A LOINC Code: 635-3 Convenience Name: Anerobic culture Electronic Signature(s) Signed: 12/19/2020 5:48:26 PM By: [name] Corwin DO Entered By: [name] on 12/19/2020 17:46:43 -------------------------------------------------------------------------------- Problem List Details Patient Name: Date of Service: [name], Aizlynn W. 12/19/2020 3:30 PM Medical Record Number: [id number] Patient Account Number: [id number] Date of Birth/Sex: Treating RN: 08-06-1940 (80 y.o. [name], [name] Primary Care Provider: Hazle [name] Other Clinician: Haywood [name] Referring  Provider: Treating Provider/Extender: [name], [name] in Treatment: 11 Active Problems ICD-10 Encounter Code Description Active Date MDM Diagnosis L89.210 Pressure ulcer of right hip, unstageable 09/30/2020 No Yes G30.1 Alzheimer's disease with late onset 09/30/2020 No Yes Inactive Problems Resolved Problems Electronic Signature(s) Signed: 12/19/2020 5:48:26 PM By: [name] Corwin DO Entered By: [name] on 12/19/2020 17:44:53 -------------------------------------------------------------------------------- Progress Note Details Patient Name: Date of Service: [name], Meleny W. 12/19/2020 3:30 PM Medical Record Number: [id number] Patient Account Number: [id number] Date of Birth/Sex: Treating RN: Apr 12, 1941 (80  y.o. [name] RowanF) Breedlove, [name] Primary Care Provider: Hazle NordmannFargo, [name] Other Clinician: Haywood PaoScammell, Michael Referring Provider: Treating Provider/Extender: [name] DrillingHoffman, Jahayra Mazo [place], [name] in Treatment: 11 Subjective Chief Complaint Information obtained from Patient Right trochanter wound History of Present Illness (HPI) Admission 6/20 Ms. [name] KannerBernice Cammack is a 80 year old female with a past medical history of dementia, hypertension that presents to the clinic for a 05-4727-month history of right trochanter wound. Daughter is present and helps provide the history. Patient is not sure exactly how this wound occurred but was noticed one day by the daughter a couple of months ago. She has visited urgent care on 2 occasions for this issue. She has been treated with doxycycline and clindamycin. She did have a culture that showed Pseudomonas. She has not received any other antibiotics besides these two antibiotics. She does tend to sleep on her right side mostly. She is not very mobile and sits in a wheelchair most of the day. She reports mild pain to the area. She reports green drainage. She denies increased warmth, erythema or purulent drainage. She did experience a fall 3  months ago and visited the ED for this. She ended up having stitches to her scalp. 6/27; patient presents for 1 week follow-up. She is present with her daughter who helps provide the history. She started using Santyl daily with dressing changes. She is also aggressively offloading the area. She overall feels well and has no complaints today. She denies signs of infection. 7/12; patient presents for 1 week follow-up. Her daughter is present and helps provide the history. They have been aggressively offloading the area the best they can. She has changed her bed around to make this happen. She has been using Santyl with wet-to-dry gauze backing. Patient is in better spirits today. She overall feels well and has no complaints today. She denies signs of infection. 8/2; patient presents for 2-week follow-up. Daughter is present and helps provide the history. She has been using Santyl with wet-to-dry backing. There are no issues or complaints today. No systemic signs of infection reported. Overall patient feels well. She continues to offload the area well. 8/15; patient presents for 2-week follow-up. She has no issues or complaints today. She denies signs of infection. 8/26; patient presents for 2-week follow-up. She continues to use Santyl on the wound bed. Daughter is present. There are no issues or complaints today. No signs of infection. 9/8; patient presents for 2-week follow-up. She has been using Santyl on the wound bed. There are no issues or complaints today. She denies systemic signs of infection. Patient History Unable to Obtain Patient History due to Dementia. Information obtained from Patient. Family History Heart Disease - Mother,Father,Siblings, Hypertension - Mother,Father,Siblings, Lung Disease - Child, No family history of Cancer, Diabetes, Hereditary Spherocytosis, Kidney Disease, Seizures, Stroke, Thyroid Problems, Tuberculosis. Social History Former smoker, Marital Status - Widowed,  Alcohol Use - Never, Drug Use - No History, Caffeine Use - Rarely. Medical History Eyes Patient has history of Cataracts - Cataract Surgery Hematologic/Lymphatic Patient has history of Anemia Respiratory Patient has history of Asthma Cardiovascular Patient has history of Hypertension, Peripheral Venous Disease Musculoskeletal Patient has history of Osteoarthritis Neurologic Patient has history of Dementia Medical A Surgical History Notes nd Gastrointestinal GERD Psychiatric Depression Objective Constitutional respirations regular, non-labored and within target range for patient.. Vitals Time Taken: 4:37 PM, Temperature: 98.1 F, Pulse: 60 bpm, Respiratory Rate: 18 breaths/min, Blood Pressure: 153/90 mmHg. Psychiatric pleasant and cooperative. General Notes: Right lower extremity: Right trochanter has an open  wound that has increased depth compared to last clinic visit. Less slough is noted. There is yellow drainage. No signs of soft tissue infection. Integumentary (Hair, Skin) Wound #1 status is Open. Original cause of wound was Pressure Injury. The date acquired was: 07/26/2020. The wound has been in treatment 11 [name]. The wound is located on the Right Trochanter. The wound measures 1.2cm length x 1cm width x 5.4cm depth; 0.942cm^2 area and 5.089cm^3 volume. There is Fat Layer (Subcutaneous Tissue) exposed. There is a medium amount of serosanguineous drainage noted. The wound margin is epibole. There is small (1-33%) pink granulation within the wound bed. There is a large (67-100%) amount of necrotic tissue within the wound bed including Adherent Slough. Assessment Active Problems ICD-10 Pressure ulcer of right hip, unstageable Alzheimer's disease with late onset Patient's eschar and slough has now been mostly removed by Santyl. There is increased depth which is not surprising. I recommended now using iodoform to the wound and Santyl to the areas of slough. There is yellow  drainage and I obtained a wound culture. I recommended close follow-up in 1 week. Plan Follow-up Appointments: Return Appointment in 1 week. - [name] Bathing/ Shower/ Hygiene: May shower and wash wound with soap and water. Off-Loading: Other: - Keep off right hip when sleeping Additional Orders / Instructions: Follow Nutritious Diet - Increase protein, 100-120g daily. Laboratory ordered were: Anerobic culture WOUND #1: - Trochanter Wound Laterality: Right Cleanser: Normal Saline (DME) (Generic) 1 x Per Day/15 Days Discharge Instructions: Cleanse the wound with Normal Saline prior to applying a clean dressing using gauze sponges, not tissue or cotton balls. Cleanser: Soap and Water 1 x Per Day/15 Days Discharge Instructions: May shower and wash wound with dial antibacterial soap and water prior to dressing change. Cleanser: Wound Cleanser (Generic) 1 x Per Day/15 Days Discharge Instructions: Cleanse the wound with wound cleanser prior to applying a clean dressing using gauze sponges, not tissue or cotton balls. Peri-Wound Care: Skin Prep (Generic) 1 x Per Day/15 Days Discharge Instructions: Use skin prep as directed Prim Dressing: Santyl Ointment 1 x Per Day/15 Days ary Discharge Instructions: Apply nickel thick amount to wound bed as instructed Prim Dressing: Iodoform packing strip 1/4 (in) (DME) (Generic) 1 x Per Day/15 Days ary Discharge Instructions: Lightly pack as instructed Secondary Dressing: Woven Gauze Sponge, Non-Sterile 4x4 in (DME) (Generic) 1 x Per Day/15 Days Discharge Instructions: Apply saline moistened gauze over primary dressing. Secondary Dressing: Zetuvit Plus Silicone Border Dressing 5x5 (in/in) (DME) (Generic) 1 x Per Day/15 Days Discharge Instructions: Apply silicone border over primary dressing as directed. 1. Santyl and iodoform packing 2. Follow-up in 1 week 3. Wound culture Electronic Signature(s) Signed: 12/19/2020 5:48:26 PM By: [name] Corwin  DO Signed: 12/19/2020 5:48:26 PM By: [name] Corwin DO Entered By: [name] on 12/19/2020 17:48:03 -------------------------------------------------------------------------------- HxROS Details Patient Name: Date of Service: [name] DE, Amaryllis W. 12/19/2020 3:30 PM Medical Record Number: [id number] Patient Account Number: [id number] Date of Birth/Sex: Treating RN: 10/30/1940 (80 y.o. [name], [name] Primary Care Provider: Hazle [name] Other Clinician: Haywood [name] Referring Provider: Treating Provider/Extender: [name], [name] in Treatment: 68 Unable to Obtain Patient History due to Dementia Information Obtained From Patient Eyes Medical History: Positive for: Cataracts - Cataract Surgery Hematologic/Lymphatic Medical History: Positive for: Anemia Respiratory Medical History: Positive for: Asthma Cardiovascular Medical History: Positive for: Hypertension; Peripheral Venous Disease Gastrointestinal Medical History: Past Medical History Notes: GERD Musculoskeletal Medical History: Positive for: Osteoarthritis Neurologic Medical History: Positive for: Dementia  Psychiatric Medical History: Past Medical History Notes: Depression HBO Extended History Items Eyes: Cataracts Immunizations Pneumococcal Vaccine: Received Pneumococcal Vaccination: No Implantable Devices None Family and Social History Cancer: No; Diabetes: No; Heart Disease: Yes - Mother,Father,Siblings; Hereditary Spherocytosis: No; Hypertension: Yes - Mother,Father,Siblings; Kidney Disease: No; Lung Disease: Yes - Child; Seizures: No; Stroke: No; Thyroid Problems: No; Tuberculosis: No; Former smoker; Marital Status - Widowed; Alcohol Use: Never; Drug Use: No History; Caffeine Use: Rarely; Financial Concerns: No; Food, Clothing or Shelter Needs: No; Support System Lacking: No; Transportation Concerns: No Electronic Signature(s) Signed: 12/19/2020 5:48:26 PM By: [name] Corwin DO Signed: 12/19/2020 6:08:32 PM By: Fonnie Mu RN Entered By: [name] on 12/19/2020 17:45:38 -------------------------------------------------------------------------------- SuperBill Details Patient Name: Date of Service: [name], Nyrah W. 12/19/2020 Medical Record Number: [id number] Patient Account Number: [id number] Date of Birth/Sex: Treating RN: 1940/10/25 (80 y.o. [name], [name] Primary Care Provider: Hazle [name] Other Clinician: Haywood [name] Referring Provider: Treating Provider/Extender: [name], [name] in Treatment: 11 Diagnosis Coding ICD-10 Codes Code Description L89.210 Pressure ulcer of right hip, unstageable G30.1 Alzheimer's disease with late onset Facility Procedures CPT4 Code: [id number] Description: 99213 - WOUND CARE VISIT-LEV 3 EST PT Modifier: Quantity: 1 Physician Procedures : CPT4 Code Description Modifier [id number] - WC PHYS LEVEL 3 - EST PT ICD-10 Diagnosis Description L89.210 Pressure ulcer of right hip, unstageable G30.1 Alzheimer's disease with late onset Quantity: 1 Electronic Signature(s) Signed: 12/19/2020 5:48:26 PM By: [name] Corwin DO Entered By: [name] on 12/19/2020 17:48:13

## 2020-12-20 ENCOUNTER — Other Ambulatory Visit (HOSPITAL_COMMUNITY)
Admit: 2020-12-20 | Discharge: 2020-12-20 | Disposition: A | Discharge status: Home / Self Care | Payer: MEDICARE | Source: Ambulatory Visit | Attending: Physician Assistant | Admitting: Physician Assistant

## 2020-12-20 DIAGNOSIS — L89210 Pressure ulcer of right hip, unstageable: Secondary | ICD-10-CM | POA: Diagnosis present

## 2020-12-22 LAB — AEROBIC CULTURE W GRAM STAIN (SUPERFICIAL SPECIMEN)

## 2020-12-23 NOTE — Progress Notes (Signed)
**Note [name]-Identified via Obfuscation** [name] ([id number]) Visit Report for 12/19/2020 Arrival Information Details Patient Name: Date of Service: Albin. 12/19/2020 3:30 PM Medical Record Number: [id number] Patient Account Number: [id number] Date of Birth/Sex: Treating RN: 1940/09/26 (80 y.o. [name], [name] Primary Care Yianni Skilling: [name] Other Clinician: Haywood Pao Referring Deniah Saia: Treating Keshawn Sundberg/Extender: Loanne Drilling, Amy Weeks in Treatment: 11 Visit Information History Since Last Visit All ordered tests and consults were completed: Yes Patient Arrived: Wheel Chair Added or deleted any medications: No Arrival Time: 16:20 Any new allergies or adverse reactions: No Accompanied By: daughter Had a fall or experienced change in No Transfer Assistance: EasyPivot Patient Lift activities of daily living that may affect Patient Identification Verified: Yes risk of falls: Secondary Verification Process Completed: Yes Signs or symptoms of abuse/neglect since last visito No Patient Requires Transmission-Based Precautions: No Hospitalized since last visit: No Patient Has Alerts: Yes Implantable device outside of the clinic excluding No Patient Alerts: Patient on Blood Thinner cellular tissue based products placed in the center since last visit: Pain Present Now: No Electronic Signature(s) Signed: 12/20/2020 8:08:09 AM By: Haywood Pao EMT Entered By: Haywood Pao on 12/19/2020 16:35:19 -------------------------------------------------------------------------------- Clinic Level of Care Assessment Details Patient Name: Date of Service: Toast. 12/19/2020 3:30 PM Medical Record Number: [id number] Patient Account Number: [id number] Date of Birth/Sex: Treating RN: Apr 22, 1940 (80 y.o. [name], [name] Primary Care Sadee Osland: [name] Other Clinician: Haywood Pao Referring Tarrence Enck: Treating Lucciano Vitali/Extender: Loanne Drilling, Amy Weeks in  Treatment: 11 Clinic Level of Care Assessment Items TOOL 4 Quantity Score X- 1 0 Use when only an EandM is performed on FOLLOW-UP visit ASSESSMENTS - Nursing Assessment / Reassessment X- 1 10 Reassessment of Co-morbidities (includes updates in patient status) []  - 0 Reassessment of Adherence to Treatment Plan ASSESSMENTS - Wound and Skin A ssessment / Reassessment X - Simple Wound Assessment / Reassessment - one wound 1 5 []  - 0 Complex Wound Assessment / Reassessment - multiple wounds X- 1 10 Dermatologic / Skin Assessment (not related to wound area) ASSESSMENTS - Focused Assessment []  - 0 Circumferential Edema Measurements - multi extremities []  - 0 Nutritional Assessment / Counseling / Intervention []  - 0 Lower Extremity Assessment (monofilament, tuning fork, pulses) []  - 0 Peripheral Arterial Disease Assessment (using hand held doppler) ASSESSMENTS - Ostomy and/or Continence Assessment and Care []  - 0 Incontinence Assessment and Management []  - 0 Ostomy Care Assessment and Management (repouching, etc.) PROCESS - Coordination of Care X - Simple Patient / Family Education for ongoing care 1 15 []  - 0 Complex (extensive) Patient / Family Education for ongoing care []  - 0 Staff obtains , Records, T Results / Process Orders est []  - 0 Staff telephones HHA, Nursing Homes / Clarify orders / etc []  - 0 Routine Transfer to another Facility (non-emergent condition) []  - 0 Routine Hospital Admission (non-emergent condition) []  - 0 New Admissions / / Ordering NPWT Apligraf, etc. , []  - 0 Emergency Hospital Admission (emergent condition) X- 1 10 Simple Discharge Coordination []  - 0 Complex (extensive) Discharge Coordination PROCESS - Special Needs []  - 0 Pediatric / Minor Patient Management []  - 0 Isolation Patient Management []  - 0 Hearing / Language / Visual special needs []  - 0 Assessment of Community assistance (transportation,  D/C planning, etc.) []  - 0 Additional assistance / Altered mentation []  - 0 Support Surface(s) Assessment (bed, cushion, seat, etc.) INTERVENTIONS - Wound Cleansing / Measurement X - Simple Wound Cleansing -  one wound 1 5 []  - 0 Complex Wound Cleansing - multiple wounds X- 1 5 Wound Imaging (photographs - any number of wounds) []  - 0 Wound Tracing (instead of photographs) X- 1 5 Simple Wound Measurement - one wound []  - 0 Complex Wound Measurement - multiple wounds INTERVENTIONS - Wound Dressings X - Small Wound Dressing one or multiple wounds 1 10 []  - 0 Medium Wound Dressing one or multiple wounds []  - 0 Large Wound Dressing one or multiple wounds []  - 0 Application of Medications - topical []  - 0 Application of Medications - injection INTERVENTIONS - Miscellaneous []  - 0 External ear exam []  - 0 Specimen Collection (cultures, biopsies, blood, body fluids, etc.) []  - 0 Specimen(s) / Culture(s) sent or taken to Lab for analysis []  - 0 Patient Transfer (multiple staff / / Similar devices) []  - 0 Simple Staple / Suture removal (25 or less) []  - 0 Complex Staple / Suture removal (26 or more) []  - 0 Hypo / Hyperglycemic Management (close monitor of Blood Glucose) []  - 0 Ankle / Brachial Index (ABI) - do not check if billed separately X- 1 5 Vital Signs Has the patient been seen at the hospital within the last three years: Yes Total Score: 80 Level Of Care: New/Established - Level 3 Electronic Signature(s) Signed: 12/19/2020 6:08:32 PM By: RN Entered By: on 12/19/2020 17:21:03 -------------------------------------------------------------------------------- Encounter Discharge Information Details Patient Name: Date of Service: , [name] 12/19/2020 3:30 PM Medical Record Number: Patient Account Number: Date of Birth/Sex: Treating RN: 06-28-1940 (80 y.o. Nurse, adult, [name] Primary Care  Dax Murguia: Other Clinician: Referring Verdell Kincannon: Treating Aristea Posada/Extender: , Amy Weeks in Treatment: 27 Encounter Discharge Information Items Discharge Condition: Stable Ambulatory Status: Wheelchair Discharge Destination: Home Transportation: Private Auto Accompanied By: daughter Schedule Follow-up Appointment: Yes Clinical Summary of Care: Electronic Signature(s) Signed: 12/19/2020 6:08:32 PM By: Fonnie Mu RN Entered By: Fonnie Mu on 12/19/2020 17:21:56 -------------------------------------------------------------------------------- Lower Extremity Assessment Details Patient Name: Date of Service: [name], [name] 12/19/2020 3:30 PM Medical Record Number: [id number] Patient Account Number: [id number] Date of Birth/Sex: Treating RN: 10-12-1940 (80 y.o. [name] Primary Care Janie Capp: [name] Other Clinician: Haywood Pao Referring Jadan Rouillard: Treating Jeanne Terrance/Extender: Loanne Drilling, Amy Weeks in Treatment: 11 Electronic Signature(s) Signed: 12/19/2020 6:08:32 PM By: 02/18/2021 RN Signed: 12/20/2020 8:08:09 AM By: Fonnie Mu EMT Entered By: 02/18/2021 on 12/19/2020 16:38:09 -------------------------------------------------------------------------------- Multi Wound Chart Details Patient Name: Date of Service: 02/18/2021, JERLENE ROCKERS. 12/19/2020 3:30 PM Medical Record Number: 09/05/1940 Patient Account Number: [id number] Date of Birth/Sex: Treating RN: 1940-11-24 (80 y.o. Haywood Pao, [name] Primary Care Marcille Barman: Loanne Drilling Other Clinician: 02/18/2021 Referring Kyanne Rials: Treating Glendon Fiser/Extender: Fonnie Mu, Amy Weeks in Treatment: 11 Vital Signs Height(in): Pulse(bpm): 60 Weight(lbs): Blood Pressure(mmHg): 153/90 Body Mass Index(BMI): Temperature(F): 98.1 Respiratory Rate(breaths/min): 18 Photos: [N/A:N/A] Right Trochanter N/A N/A Wound  Location: Pressure Injury N/A N/A Wounding Event: Pressure Ulcer N/A N/A Primary Etiology: Cataracts, Anemia, Asthma, N/A N/A Comorbid History: Hypertension, Peripheral Venous Disease, Osteoarthritis, Dementia 07/26/2020 N/A N/A Date Acquired: 11 N/A N/A Weeks of Treatment: Open N/A N/A Wound Status: 1.2x1x5.4 N/A N/A Measurements L x W x D (cm) 0.942 N/A N/A A (cm) : rea 5.089 N/A N/A Volume (cm) : 92.10% N/A N/A % Reduction in A rea: -42.10% N/A N/A % Reduction in Volume: Unstageable/Unclassified N/A N/A Classification: Medium N/A N/A Exudate A mount: Serosanguineous N/A  N/A Exudate Type: red, brown N/A N/A Exudate Color: Epibole N/A N/A Wound Margin: Small (1-33%) N/A N/A Granulation A mount: Pink N/A N/A Granulation Quality: Large (67-100%) N/A N/A Necrotic A mount: Fat Layer (Subcutaneous Tissue): Yes N/A N/A Exposed Structures: Fascia: No Tendon: No Muscle: No Joint: No Bone: No Small (1-33%) N/A N/A Epithelialization: Treatment Notes Wound #1 (Trochanter) Wound Laterality: Right Cleanser Normal Saline Discharge Instruction: Cleanse the wound with Normal Saline prior to applying a clean dressing using gauze sponges, not tissue or cotton balls. Soap and Water Discharge Instruction: May shower and wash wound with dial antibacterial soap and water prior to dressing change. Wound Cleanser Discharge Instruction: Cleanse the wound with wound cleanser prior to applying a clean dressing using gauze sponges, not tissue or cotton balls. Peri-Wound Care Skin Prep Discharge Instruction: Use skin prep as directed Topical Primary Dressing Santyl Ointment Discharge Instruction: Apply nickel thick amount to wound bed as instructed Iodoform packing strip 1/4 (in) Discharge Instruction: Lightly pack as instructed Secondary Dressing Woven Gauze Sponge, Non-Sterile 4x4 in Discharge Instruction: Apply saline moistened gauze over primary dressing. Zetuvit Plus  Silicone Border Dressing 5x5 (in/in) Discharge Instruction: Apply silicone border over primary dressing as directed. Secured With Compression Wrap Compression Stockings Facilities manager) Signed: 12/19/2020 5:48:26 PM By: Geralyn Corwin DO Signed: 12/19/2020 6:08:32 PM By: Fonnie Mu RN Entered By: Geralyn Corwin on 12/19/2020 17:44:59 -------------------------------------------------------------------------------- Multi-Disciplinary Care Plan Details Patient Name: Date of Service: [name], Tianne W. 12/19/2020 3:30 PM Medical Record Number: [id number] Patient Account Number: [id number] Date of Birth/Sex: Treating RN: [birth year] (80 y.o. [name], [name] Primary Care Trelon Plush: [name] Other Clinician: Haywood Pao Referring Eloise Mula: Treating Leesa Leifheit/Extender: Loanne Drilling, Amy Weeks in Treatment: 11 Active Inactive Pressure Nursing Diagnoses: Knowledge deficit related to causes and risk factors for pressure ulcer development Knowledge deficit related to management of pressures ulcers Goals: Patient will remain free from development of additional pressure ulcers Date Initiated: 09/30/2020 Target Resolution Date: 01/02/2021 Goal Status: Active Patient/caregiver will verbalize understanding of pressure ulcer management Date Initiated: 09/30/2020 Target Resolution Date: 01/02/2021 Goal Status: Active Interventions: Assess: immobility, friction, shearing, incontinence upon admission and as needed Assess offloading mechanisms upon admission and as needed Assess potential for pressure ulcer upon admission and as needed Provide education on pressure ulcers Notes: Wound/Skin Impairment Nursing Diagnoses: Impaired tissue integrity Goals: Patient/caregiver will verbalize understanding of skin care regimen Date Initiated: 09/30/2020 Target Resolution Date: 01/03/2021 Goal Status: Active Ulcer/skin breakdown will have a volume reduction of 30%  by week 4 Date Initiated: 09/30/2020 Target Resolution Date: 01/02/2021 Goal Status: Active Interventions: Assess patient/caregiver ability to obtain necessary supplies Assess patient/caregiver ability to perform ulcer/skin care regimen upon admission and as needed Assess ulceration(s) every visit Provide education on ulcer and skin care Treatment Activities: Skin care regimen initiated : 09/30/2020 Topical wound management initiated : 09/30/2020 Notes: Electronic Signature(s) Signed: 12/19/2020 6:08:32 PM By: Fonnie Mu RN Entered By: Fonnie Mu on 12/19/2020 17:03:29 -------------------------------------------------------------------------------- Pain Assessment Details Patient Name: Date of Service: [name], [name] 12/19/2020 3:30 PM Medical Record Number: [id number] Patient Account Number: [id number] Date of Birth/Sex: Treating RN: 02-23-1941 (80 y.o. [name], [name] Primary Care Jericka Kadar: [name] Other Clinician: Haywood Pao Referring Jonus Coble: Treating Brenyn Petrey/Extender: Loanne Drilling, Amy Weeks in Treatment: 11 Active Problems Location of Pain Severity and Description of Pain Patient Has Paino No Site Locations Pain Management and Medication Current Pain Management: Electronic Signature(s) Signed: 12/19/2020 6:08:32 PM By: Fonnie Mu RN Signed: 12/20/2020 8:08:09  AM By: Haywood PaoScammell, Michael EMT Entered By: Haywood PaoScammell, Michael on 12/19/2020 16:38:02 -------------------------------------------------------------------------------- Patient/Caregiver Education Details Patient Name: Date of Service: [name], [name] 9/8/2022andnbsp3:30 PM Medical Record Number: [id number] Patient Account Number: [id number] Date of Birth/Gender: Treating RN: 09-09-1940 (80 y.o. Toniann FailF) Breedlove, [name] Primary Care Physician: Hazle NordmannFargo, Amy Other Clinician: Haywood PaoScammell, Michael Referring Physician: Treating Physician/Extender: Earl ManyHoffman, Jessica Fargo, Amy Weeks in  Treatment: 11 Education Assessment Education Provided To: Patient Education Topics Provided Pressure: Methods: Explain/Verbal Responses: Reinforcements needed Wound/Skin Impairment: Methods: Explain/Verbal Responses: Reinforcements needed Electronic Signature(s) Signed: 12/19/2020 6:08:32 PM By: Fonnie MuBreedlove, [name] RN Entered By: Fonnie MuBreedlove, [name] on 12/19/2020 17:02:07 -------------------------------------------------------------------------------- Wound Assessment Details Patient Name: Date of Service: [name], [name] MaeBERNICE W. 12/19/2020 3:30 PM Medical Record Number: [id number] Patient Account Number: [id number] Date of Birth/Sex: Treating RN: 09-09-1940 (80 y.o. [name] RowanF) Breedlove, [name] Primary Care Juelz Whittenberg: Hazle NordmannFargo, Amy Other Clinician: Karl Itoawkins, Destiny Referring Vence Lalor: Treating Mccartney Chuba/Extender: Loanne DrillingHoffman, Jessica Fargo, Amy Weeks in Treatment: 11 Wound Status Wound Number: 1 Primary Pressure Ulcer Etiology: Wound Location: Right Trochanter Wound Open Wounding Event: Pressure Injury Status: Date Acquired: 07/26/2020 Comorbid Cataracts, Anemia, Asthma, Hypertension, Peripheral Venous Weeks Of Treatment: 11 History: Disease, Osteoarthritis, Dementia Clustered Wound: No Photos Wound Measurements Length: (cm) 1.2 Width: (cm) 1 Depth: (cm) 5.4 Area: (cm) 0.942 Volume: (cm) 5.089 % Reduction in Area: 92.1% % Reduction in Volume: -42.1% Epithelialization: Small (1-33%) Wound Description Classification: Unstageable/Unclassified Wound Margin: Epibole Exudate Amount: Medium Exudate Type: Serosanguineous Exudate Color: red, brown Foul Odor After Cleansing: No Slough/Fibrino Yes Wound Bed Granulation Amount: Small (1-33%) Exposed Structure Granulation Quality: Pink Fascia Exposed: No Necrotic Amount: Large (67-100%) Fat Layer (Subcutaneous Tissue) Exposed: Yes Necrotic Quality: Adherent Slough Tendon Exposed: No Muscle Exposed: No Joint Exposed: No Bone Exposed:  No Treatment Notes Wound #1 (Trochanter) Wound Laterality: Right Cleanser Normal Saline Discharge Instruction: Cleanse the wound with Normal Saline prior to applying a clean dressing using gauze sponges, not tissue or cotton balls. Soap and Water Discharge Instruction: May shower and wash wound with dial antibacterial soap and water prior to dressing change. Wound Cleanser Discharge Instruction: Cleanse the wound with wound cleanser prior to applying a clean dressing using gauze sponges, not tissue or cotton balls. Peri-Wound Care Skin Prep Discharge Instruction: Use skin prep as directed Topical Primary Dressing Santyl Ointment Discharge Instruction: Apply nickel thick amount to wound bed as instructed Iodoform packing strip 1/4 (in) Discharge Instruction: Lightly pack as instructed Secondary Dressing Woven Gauze Sponge, Non-Sterile 4x4 in Discharge Instruction: Apply saline moistened gauze over primary dressing. Zetuvit Plus Silicone Border Dressing 5x5 (in/in) Discharge Instruction: Apply silicone border over primary dressing as directed. Secured With Compression Wrap Compression Stockings Facilities managerAdd-Ons Electronic Signature(s) Signed: 12/19/2020 6:08:32 PM By: Fonnie MuBreedlove, [name] RN Signed: 12/23/2020 8:36:00 AM By: Karl Itoawkins, Destiny Entered By: Karl Itoawkins, Destiny on 12/19/2020 16:39:25 -------------------------------------------------------------------------------- Vitals Details Patient Name: Date of Service: [name], [name] 12/19/2020 3:30 PM Medical Record Number: [id number] Patient Account Number: [id number] Date of Birth/Sex: Treating RN: 09-09-1940 (80 y.o. [name] RowanF) Breedlove, [name] Primary Care Maximilliano Kersh: Hazle NordmannFargo, Amy Other Clinician: Haywood PaoScammell, Michael Referring Blaklee Sh[place]: Treating Jaevion Goto/Extender: Loanne DrillingHoffman, Jessica Fargo, Amy Weeks in Treatment: 11 Vital Signs Time Taken: 16:37 Temperature (F): 98.1 Pulse (bpm): 60 Respiratory Rate (breaths/min): 18 Blood Pressure (mmHg):  153/90 Reference Range: 80 - 120 mg / dl Electronic Signature(s) Signed: 12/20/2020 8:08:09 AM By: Haywood PaoScammell, Michael EMT Entered By: Haywood PaoScammell, Michael on 12/19/2020 16:37:53

## 2020-12-24 ENCOUNTER — Telehealth: Payer: Self-pay | Admitting: Physician Assistant

## 2020-12-24 NOTE — Telephone Encounter (Signed)
Patient's daughter called to check on the status of the FMLA forms faxed in last week.

## 2020-12-24 NOTE — Progress Notes (Signed)
AUBRIELLE, STROUD ([id number]) Visit Report for 11/12/2020 Chief Complaint Document Details Patient Name: Date of Service: TOMEEKA, PLAUGHER. 11/12/2020 8:00 A M Medical Record Number: [id number] Patient Account Number: [id number] Date of Birth/Sex: Treating RN: 1940/10/08 (80 y.o. [name] Primary Care Provider: [name] Other Clinician: Referring Provider: Treating Provider/Extender: Loanne Drilling, Amy Weeks in Treatment: 6 Information Obtained from: Patient Chief Complaint Right trochanter wound Electronic Signature(s) Signed: 11/12/2020 9:15:51 AM By: Geralyn Corwin DO Entered By: Geralyn Corwin on 11/12/2020 09:09:42 -------------------------------------------------------------------------------- Debridement Details Patient Name: Date of Service: [name], Nicki W. 11/12/2020 8:00 A M Medical Record Number: [id number] Patient Account Number: [id number] Date of Birth/Sex: Treating RN: 13-Mar-1941 (80 y.o. [name], [name] Primary Care Provider: [name] Other Clinician: Referring Provider: Treating Provider/Extender: Loanne Drilling, Amy Weeks in Treatment: 6 Debridement Performed for Assessment: Wound #1 Right Trochanter Performed By: Physician Geralyn Corwin, DO Debridement Type: Debridement Level of Consciousness (Pre-procedure): Awake and Alert Pre-procedure Verification/Time Out Yes - 08:35 Taken: Start Time: 08:35 Pain Control: Lidocaine T Area Debrided (L x W): otal 2.5 (cm) x 1.6 (cm) = 4 (cm) Tissue and other material debrided: Slough, Subcutaneous, Skin: Dermis , Skin: Epidermis, Slough Level: Skin/Subcutaneous Tissue Debridement Description: Excisional Instrument: Blade, Curette, Scissors Bleeding: Minimum Hemostasis Achieved: Pressure End Time: 08:36 Procedural Pain: 0 Post Procedural Pain: 0 Response to Treatment: Procedure was tolerated well Level of Consciousness (Post- Awake and Alert procedure): Post Debridement  Measurements of Total Wound Length: (cm) 2.5 Stage: Unstageable/Unclassified Width: (cm) 1.6 Depth: (cm) 1.3 Volume: (cm) 4.084 Character of Wound/Ulcer Post Debridement: Improved Post Procedure Diagnosis Same as Pre-procedure Electronic Signature(s) Signed: 11/12/2020 9:15:51 AM By: Geralyn Corwin DO Signed: 12/24/2020 8:00:33 AM By: Fonnie Mu RN Entered By: Fonnie Mu on 11/12/2020 08:37:10 -------------------------------------------------------------------------------- HPI Details Patient Name: Date of Service: [name], Paulita W. 11/12/2020 8:00 A M Medical Record Number: [id number] Patient Account Number: [id number] Date of Birth/Sex: Treating RN: [birth year] (80 y.o. [name] Primary Care Provider: [name] Other Clinician: Referring Provider: Treating Provider/Extender: Loanne Drilling, Amy Weeks in Treatment: 6 History of Present Illness HPI Description: Admission 6/20 Ms. Mayola Mcbain is a 80 year old female with a past medical history of dementia, hypertension that presents to the clinic for a 2-10-month history of right trochanter wound. Daughter is present and helps provide the history. Patient is not sure exactly how this wound occurred but was noticed one day by the daughter a couple of months ago. She has visited urgent care on 2 occasions for this issue. She has been treated with doxycycline and clindamycin. She did have a culture that showed Pseudomonas. She has not received any other antibiotics besides these two antibiotics. She does tend to sleep on her right side mostly. She is not very mobile and sits in a wheelchair most of the day. She reports mild pain to the area. She reports green drainage. She denies increased warmth, erythema or purulent drainage. She did experience a fall 3 months ago and visited the ED for this. She ended up having stitches to her scalp. 6/27; patient presents for 1 week follow-up. She is present with her  daughter who helps provide the history. She started using Santyl daily with dressing changes. She is also aggressively offloading the area. She overall feels well and has no complaints today. She denies signs of infection. 7/12; patient presents for 1 week follow-up. Her daughter is present and helps provide the history. They have been aggressively offloading the area  the best they can. She has changed her bed around to make this happen. She has been using Santyl with wet-to-dry gauze backing. Patient is in better spirits today. She overall feels well and has no complaints today. She denies signs of infection. 8/2; patient presents for 2-week follow-up. Daughter is present and helps provide the history. She has been using Santyl with wet-to-dry backing. There are no issues or complaints today. No systemic signs of infection reported. Overall patient feels well. She continues to offload the area well. Electronic Signature(s) Signed: 11/12/2020 9:15:51 AM By: Geralyn Corwin DO Entered By: Geralyn Corwin on 11/12/2020 09:10:33 -------------------------------------------------------------------------------- Physical Exam Details Patient Name: Date of Service: [name], Boots W. 11/12/2020 8:00 A M Medical Record Number: [id number] Patient Account Number: [id number] Date of Birth/Sex: Treating RN: 1940-12-23 (80 y.o. [name] Primary Care Provider: [name] Other Clinician: Referring Provider: Treating Provider/Extender: Loanne Drilling, Amy Weeks in Treatment: 6 Constitutional respirations regular, non-labored and within target range for patient.Marland Kitchen Psychiatric pleasant and cooperative. Notes Right lower extremity: Right trochanter has a open wound with nonviable tissue present And granulation tissue circumferentially. No Increased erythema, warmth or purulent drainage. No induration noted and surrounding skin appears healthy and intact Electronic Signature(s) Signed:  11/12/2020 9:15:51 AM By: Geralyn Corwin DO Entered By: Geralyn Corwin on 11/12/2020 09:11:22 -------------------------------------------------------------------------------- Physician Orders Details Patient Name: Date of Service: [name], Deirdre W. 11/12/2020 8:00 A M Medical Record Number: [id number] Patient Account Number: [id number] Date of Birth/Sex: Treating RN: 03/24/1941 (80 y.o. [name] Primary Care Provider: [name] Other Clinician: Referring Provider: Treating Provider/Extender: Loanne Drilling, Amy Weeks in Treatment: 6 Verbal / Phone Orders: No Diagnosis Coding ICD-10 Coding Code Description L89.210 Pressure ulcer of right hip, unstageable G30.1 Alzheimer's disease with late onset Follow-up Appointments ppointment in 2 weeks. - with Dr. Mikey Bussing Return A Bathing/ Shower/ Hygiene May shower and wash wound with soap and water. Off-Loading Other: - Keep off right hip when sleeping Additional Orders / Instructions Follow Nutritious Diet - Increase protein, 100-120g daily. Wound Treatment Wound #1 - Trochanter Wound Laterality: Right Cleanser: Normal Saline (DME) (Generic) 1 x Per Day/30 Days Discharge Instructions: Cleanse the wound with Normal Saline prior to applying a clean dressing using gauze sponges, not tissue or cotton balls. Cleanser: Soap and Water 1 x Per Day/30 Days Discharge Instructions: May shower and wash wound with dial antibacterial soap and water prior to dressing change. Cleanser: Wound Cleanser (DME) (Generic) 1 x Per Day/30 Days Discharge Instructions: Cleanse the wound with wound cleanser prior to applying a clean dressing using gauze sponges, not tissue or cotton balls. Peri-Wound Care: Skin Prep (DME) (Generic) 1 x Per Day/30 Days Discharge Instructions: Use skin prep as directed Prim Dressing: Santyl Ointment 1 x Per Day/30 Days ary Discharge Instructions: Apply nickel thick amount to wound bed as instructed Secondary  Dressing: Woven Gauze Sponge, Non-Sterile 4x4 in (DME) (Generic) 1 x Per Day/30 Days Discharge Instructions: Apply saline moistened gauze over primary dressing. Secondary Dressing: Zetuvit Plus Silicone Border Dressing 5x5 (in/in) (DME) (Generic) 1 x Per Day/30 Days Discharge Instructions: Apply silicone border over primary dressing as directed. Secured With: 55M Medipore H Soft Cloth Surgical Tape, 2x2 (in/yd) (DME) (Generic) 1 x Per Day/30 Days Discharge Instructions: Secure dressing with tape as directed. Patient Medications llergies: Effexor A Notifications Medication Indication Start End 11/12/2020 Santyl DOSE 1 - topical 250 unit/gram ointment - 1 application daily Electronic Signature(s) Signed: 11/12/2020 9:14:14 AM By: Geralyn Corwin DO  Entered By: Geralyn Corwin on 11/12/2020 09:14:13 -------------------------------------------------------------------------------- Problem List Details Patient Name: Date of Service: [place], LANGSTAFF. 11/12/2020 8:00 A M Medical Record Number: [id number] Patient Account Number: [id number] Date of Birth/Sex: Treating RN: 07-27-1940 (80 y.o. [name], [name] Primary Care Provider: [name] Other Clinician: Referring Provider: Treating Provider/Extender: Loanne Drilling, Amy Weeks in Treatment: 6 Active Problems ICD-10 Encounter Code Description Active Date MDM Diagnosis L89.210 Pressure ulcer of right hip, unstageable 09/30/2020 No Yes G30.1 Alzheimer's disease with late onset 09/30/2020 No Yes Inactive Problems Resolved Problems Electronic Signature(s) Signed: 11/12/2020 9:15:51 AM By: Geralyn Corwin DO Entered By: Geralyn Corwin on 11/12/2020 09:09:29 -------------------------------------------------------------------------------- Progress Note Details Patient Name: Date of Service: [name], Vonya W. 11/12/2020 8:00 A M Medical Record Number: [id number] Patient Account Number: [id number] Date of Birth/Sex: Treating  RN: December 10, 1940 (80 y.o. [name] Primary Care Provider: [name] Other Clinician: Referring Provider: Treating Provider/Extender: Loanne Drilling, Amy Weeks in Treatment: 6 Subjective Chief Complaint Information obtained from Patient Right trochanter wound History of Present Illness (HPI) Admission 6/20 Ms. Aleesa Sweigert is a 80 year old female with a past medical history of dementia, hypertension that presents to the clinic for a 2-70-month history of right trochanter wound. Daughter is present and helps provide the history. Patient is not sure exactly how this wound occurred but was noticed one day by the daughter a couple of months ago. She has visited urgent care on 2 occasions for this issue. She has been treated with doxycycline and clindamycin. She did have a culture that showed Pseudomonas. She has not received any other antibiotics besides these two antibiotics. She does tend to sleep on her right side mostly. She is not very mobile and sits in a wheelchair most of the day. She reports mild pain to the area. She reports green drainage. She denies increased warmth, erythema or purulent drainage. She did experience a fall 3 months ago and visited the ED for this. She ended up having stitches to her scalp. 6/27; patient presents for 1 week follow-up. She is present with her daughter who helps provide the history. She started using Santyl daily with dressing changes. She is also aggressively offloading the area. She overall feels well and has no complaints today. She denies signs of infection. 7/12; patient presents for 1 week follow-up. Her daughter is present and helps provide the history. They have been aggressively offloading the area the best they can. She has changed her bed around to make this happen. She has been using Santyl with wet-to-dry gauze backing. Patient is in better spirits today. She overall feels well and has no complaints today. She denies signs of  infection. 8/2; patient presents for 2-week follow-up. Daughter is present and helps provide the history. She has been using Santyl with wet-to-dry backing. There are no issues or complaints today. No systemic signs of infection reported. Overall patient feels well. She continues to offload the area well. Patient History Unable to Obtain Patient History due to Dementia. Information obtained from Patient. Family History Heart Disease - Mother,Father,Siblings, Hypertension - Mother,Father,Siblings, Lung Disease - Child, No family history of Cancer, Diabetes, Hereditary Spherocytosis, Kidney Disease, Seizures, Stroke, Thyroid Problems, Tuberculosis. Social History Former smoker, Marital Status - Widowed, Alcohol Use - Never, Drug Use - No History, Caffeine Use - Rarely. Medical History Eyes Patient has history of Cataracts - Cataract Surgery Hematologic/Lymphatic Patient has history of Anemia Respiratory Patient has history of Asthma Cardiovascular Patient has history of Hypertension, Peripheral Venous  Disease Musculoskeletal Patient has history of Osteoarthritis Neurologic Patient has history of Dementia Medical A Surgical History Notes nd Gastrointestinal GERD Psychiatric Depression Objective Constitutional respirations regular, non-labored and within target range for patient.. Vitals Time Taken: 8:10 AM, Temperature: 97.8 F, Pulse: 58 bpm, Respiratory Rate: 20 breaths/min, Blood Pressure: 140/84 mmHg. Psychiatric pleasant and cooperative. General Notes: Right lower extremity: Right trochanter has a open wound with nonviable tissue present And granulation tissue circumferentially. No Increased erythema, warmth or purulent drainage. No induration noted and surrounding skin appears healthy and intact Integumentary (Hair, Skin) Wound #1 status is Open. Original cause of wound was Pressure Injury. The date acquired was: 07/26/2020. The wound has been in treatment 6 weeks.  The wound is located on the Right Trochanter. The wound measures 2.5cm length x 1.6cm width x 1.3cm depth; 3.142cm^2 area and 4.084cm^3 volume. There is Fat Layer (Subcutaneous Tissue) exposed. There is no tunneling noted, however, there is undermining starting at 12:00 and ending at 3:00 with a maximum distance of 0.9cm. There is a medium amount of purulent drainage noted. The wound margin is epibole. There is small (1-33%) pink granulation within the wound bed. There is a large (67-100%) amount of necrotic tissue within the wound bed including Adherent Slough. Assessment Active Problems ICD-10 Pressure ulcer of right hip, unstageable Alzheimer's disease with late onset Patient's wound continues to show improvement with the use of Santyl and wet-to-dry backing. I debrided nonviable tissue. No signs of infection on exam. I recommended continuing with current treatment. As well as offloading the area. Follow-up in 2 weeks. Procedures Wound #1 Pre-procedure diagnosis of Wound #1 is a Pressure Ulcer located on the Right Trochanter . There was a Excisional Skin/Subcutaneous Tissue Debridement with a total area of 4 sq cm performed by Geralyn Corwin, DO. With the following instrument(s): Blade, Curette, and Scissors Material removed includes Subcutaneous Tissue, Slough, Skin: Dermis, and Skin: Epidermis after achieving pain control using Lidocaine. No specimens were taken. A time out was conducted at 08:35, prior to the start of the procedure. A Minimum amount of bleeding was controlled with Pressure. The procedure was tolerated well with a pain level of 0 throughout and a pain level of 0 following the procedure. Post Debridement Measurements: 2.5cm length x 1.6cm width x 1.3cm depth; 4.084cm^3 volume. Post debridement Stage noted as Unstageable/Unclassified. Character of Wound/Ulcer Post Debridement is improved. Post procedure Diagnosis Wound #1: Same as Pre-Procedure Plan Follow-up  Appointments: Return Appointment in 2 weeks. - with Dr. Mikey Bussing Bathing/ Shower/ Hygiene: May shower and wash wound with soap and water. Off-Loading: Other: - Keep off right hip when sleeping Additional Orders / Instructions: Follow Nutritious Diet - Increase protein, 100-120g daily. The following medication(s) was prescribed: Santyl topical 250 unit/gram ointment 1 1 application daily starting 11/12/2020 WOUND #1: - Trochanter Wound Laterality: Right Cleanser: Normal Saline (DME) (Generic) 1 x Per Day/30 Days Discharge Instructions: Cleanse the wound with Normal Saline prior to applying a clean dressing using gauze sponges, not tissue or cotton balls. Cleanser: Soap and Water 1 x Per Day/30 Days Discharge Instructions: May shower and wash wound with dial antibacterial soap and water prior to dressing change. Cleanser: Wound Cleanser (DME) (Generic) 1 x Per Day/30 Days Discharge Instructions: Cleanse the wound with wound cleanser prior to applying a clean dressing using gauze sponges, not tissue or cotton balls. Peri-Wound Care: Skin Prep (DME) (Generic) 1 x Per Day/30 Days Discharge Instructions: Use skin prep as directed Prim Dressing: Santyl Ointment 1 x Per Day/30  Days ary Discharge Instructions: Apply nickel thick amount to wound bed as instructed Secondary Dressing: Woven Gauze Sponge, Non-Sterile 4x4 in (DME) (Generic) 1 x Per Day/30 Days Discharge Instructions: Apply saline moistened gauze over primary dressing. Secondary Dressing: Zetuvit Plus Silicone Border Dressing 5x5 (in/in) (DME) (Generic) 1 x Per Day/30 Days Discharge Instructions: Apply silicone border over primary dressing as directed. Secured With: 62M Medipore H Soft Cloth Surgical T ape, 2x2 (in/yd) (DME) (Generic) 1 x Per Day/30 Days Discharge Instructions: Secure dressing with tape as directed. 1. In office sharp debridement 2. Continue Santyl with wet-to-dry backing 3. Follow-up in 2 weeks Electronic  Signature(s) Signed: 11/12/2020 9:15:51 AM By: Geralyn Corwin DO Entered By: Geralyn Corwin on 11/12/2020 09:14:25 -------------------------------------------------------------------------------- HxROS Details Patient Name: Date of Service: [name], Marica W. 11/12/2020 8:00 A M Medical Record Number: [id number] Patient Account Number: [id number] Date of Birth/Sex: Treating RN: [birth year] (80 y.o. [name], [name] Primary Care Provider: [name] Other Clinician: Referring Provider: Treating Provider/Extender: Loanne Drilling, Amy Weeks in Treatment: 6 Unable to Obtain Patient History due to Dementia Information Obtained From Patient Eyes Medical History: Positive for: Cataracts - Cataract Surgery Hematologic/Lymphatic Medical History: Positive for: Anemia Respiratory Medical History: Positive for: Asthma Cardiovascular Medical History: Positive for: Hypertension; Peripheral Venous Disease Gastrointestinal Medical History: Past Medical History Notes: GERD Musculoskeletal Medical History: Positive for: Osteoarthritis Neurologic Medical History: Positive for: Dementia Psychiatric Medical History: Past Medical History Notes: Depression HBO Extended History Items Eyes: Cataracts Immunizations Pneumococcal Vaccine: Received Pneumococcal Vaccination: No Implantable Devices None Family and Social History Cancer: No; Diabetes: No; Heart Disease: Yes - Mother,Father,Siblings; Hereditary Spherocytosis: No; Hypertension: Yes - Mother,Father,Siblings; Kidney Disease: No; Lung Disease: Yes - Child; Seizures: No; Stroke: No; Thyroid Problems: No; Tuberculosis: No; Former smoker; Marital Status - Widowed; Alcohol Use: Never; Drug Use: No History; Caffeine Use: Rarely; Financial Concerns: No; Food, Clothing or Shelter Needs: No; Support System Lacking: No; Transportation Concerns: No Electronic Signature(s) Signed: 11/12/2020 9:15:51 AM By: Geralyn Corwin  DO Signed: 12/24/2020 8:00:33 AM By: Fonnie Mu RN Entered By: Geralyn Corwin on 11/12/2020 09:10:51 -------------------------------------------------------------------------------- SuperBill Details Patient Name: Date of Service: [name], Marieke W. 11/12/2020 Medical Record Number: [id number] Patient Account Number: [id number] Date of Birth/Sex: Treating RN: 1940-12-19 (80 y.o. [name] Primary Care Provider: [name] Other Clinician: Referring Provider: Treating Provider/Extender: Loanne Drilling, Amy Weeks in Treatment: 6 Diagnosis Coding ICD-10 Codes Code Description [phone] Pressure ulcer of right hip, unstageable G30.1 Alzheimer's disease with late onset Facility Procedures CPT4 Code: [id number] Description: 11042 - DEB SUBQ TISSUE 20 SQ CM/< ICD-10 Diagnosis Description L89.210 Pressure ulcer of right hip, unstageable Modifier: Quantity: 1 Physician Procedures : CPT4 Code Description Modifier [id number] 11042 - WC PHYS SUBQ TISS 20 SQ CM ICD-10 Diagnosis Description L89.210 Pressure ulcer of right hip, unstageable Quantity: 1 Electronic Signature(s) Signed: 11/12/2020 9:15:51 AM By: Geralyn Corwin DO Entered By: Geralyn Corwin on 11/12/2020 09:14:44

## 2020-12-24 NOTE — Telephone Encounter (Signed)
Pls confirm, I need to know how many days in a month she needs, the last time we wrote 4 days a month. Thanks

## 2020-12-24 NOTE — Progress Notes (Signed)
**Note [name]-Identified via Obfuscation** [name], [name] ([id number]) Visit Report for 11/12/2020 Arrival Information Details Patient Name: Date of Service: [place]. 11/12/2020 8:00 A M Medical Record Number: [id number] Patient Account Number: [id number] Date of Birth/Sex: Treating RN: [birth year] (80 y.o. Arta Silence Primary Care Makenze Ellett: [name] Other Clinician: Referring Shakil Dirk: Treating Darryn Kydd/Extender: Loanne Drilling, Amy Weeks in Treatment: 6 Visit Information History Since Last Visit Added or deleted any medications: No Patient Arrived: Wheel Chair Any new allergies or adverse reactions: No Arrival Time: 08:10 Had a fall or experienced change in No Accompanied By: daughter activities of daily living that may affect Transfer Assistance: Manual risk of falls: Patient Identification Verified: Yes Signs or symptoms of abuse/neglect since last visito No Secondary Verification Process Completed: Yes Hospitalized since last visit: No Patient Requires Transmission-Based Precautions: No Implantable device outside of the clinic excluding No Patient Has Alerts: Yes cellular tissue based products placed in the center Patient Alerts: Patient on Blood Thinner since last visit: Has Dressing in Place as Prescribed: Yes Pain Present Now: No Electronic Signature(s) Signed: 11/12/2020 11:55:49 AM By: Shawn Stall Entered By: Shawn Stall on 11/12/2020 08:21:24 -------------------------------------------------------------------------------- Encounter Discharge Information Details Patient Name: Date of Service: [name], [name] 11/12/2020 8:00 A M Medical Record Number: [id number] Patient Account Number: [id number] Date of Birth/Sex: Treating RN: 24-Feb-1941 (80 y.o. Roel Cluck Primary Care Geovonni Meyerhoff: [name] Other Clinician: Referring Maurianna Benard: Treating Sonjia Wilcoxson/Extender: Loanne Drilling, Amy Weeks in Treatment: 6 Encounter Discharge Information Items Post Procedure Vitals Discharge  Condition: Stable Temperature (F): 97.8 Ambulatory Status: Wheelchair Pulse (bpm): 58 Discharge Destination: Home Respiratory Rate (breaths/min): 20 Transportation: Private Auto Blood Pressure (mmHg): 140/84 Accompanied By: Daughter Schedule Follow-up Appointment: Yes Clinical Summary of Care: Provided on 11/12/2020 Form Type Recipient Paper Patient Patient Electronic Signature(s) Signed: 11/12/2020 8:59:26 AM By: Antonieta Iba Entered By: Antonieta Iba on 11/12/2020 08:59:26 -------------------------------------------------------------------------------- Lower Extremity Assessment Details Patient Name: Date of Service: [name], [name] 11/12/2020 8:00 A M Medical Record Number: [id number] Patient Account Number: [id number] Date of Birth/Sex: Treating RN: 1940/11/14 (80 y.o. Arta Silence Primary Care Micael Barb: [name] Other Clinician: Referring Malli Falotico: Treating Twanda Stakes/Extender: Loanne Drilling, Amy Weeks in Treatment: 6 Electronic Signature(s) Signed: 11/12/2020 11:55:49 AM By: Shawn Stall Entered By: Shawn Stall on 11/12/2020 08:21:55 -------------------------------------------------------------------------------- Multi Wound Chart Details Patient Name: Date of Service: [name], [name] 11/12/2020 8:00 A M Medical Record Number: [id number] Patient Account Number: [id number] Date of Birth/Sex: Treating RN: July 20, 1940 (80 y.o. [name], [name] Primary Care Frantz Quattrone: [name] Other Clinician: Referring Yesenia Fontenette: Treating Nesanel Aguila/Extender: Loanne Drilling, Amy Weeks in Treatment: 6 Vital Signs Height(in): Pulse(bpm): 58 Weight(lbs): Blood Pressure(mmHg): 140/84 Body Mass Index(BMI): Temperature(F): 97.8 Respiratory Rate(breaths/min): 20 Photos: [N/A:N/A] Right Trochanter N/A N/A Wound Location: Pressure Injury N/A N/A Wounding Event: Pressure Ulcer N/A N/A Primary Etiology: Cataracts, Anemia, Asthma, N/A N/A Comorbid  History: Hypertension, Peripheral Venous Disease, Osteoarthritis, Dementia 07/26/2020 N/A N/A Date Acquired: 6 N/A N/A Weeks of Treatment: Open N/A N/A Wound Status: 2.5x1.6x1.3 N/A N/A Measurements L x W x D (cm) 3.142 N/A N/A A (cm) : rea 4.084 N/A N/A Volume (cm) : 73.70% N/A N/A % Reduction in A rea: -14.00% N/A N/A % Reduction in Volume: 12 Starting Position 1 (o'clock): 3 Ending Position 1 (o'clock): 0.9 Maximum Distance 1 (cm): Yes N/A N/A Undermining: Unstageable/Unclassified N/A N/A Classification: Medium N/A N/A Exudate A mount: Purulent N/A N/A Exudate Type: yellow, brown, green N/A N/A Exudate Color: Epibole N/A N/A Wound  Margin: Small (1-33%) N/A N/A Granulation Amount: Pink N/A N/A Granulation Quality: Large (67-100%) N/A N/A Necrotic Amount: Fat Layer (Subcutaneous Tissue): Yes N/A N/A Exposed Structures: Fascia: No Tendon: No Muscle: No Joint: No Bone: No None N/A N/A Epithelialization: Debridement - Excisional N/A N/A Debridement: Pre-procedure Verification/Time Out 08:35 N/A N/A Taken: Lidocaine N/A N/A Pain Control: Subcutaneous, Slough N/A N/A Tissue Debrided: Skin/Subcutaneous Tissue N/A N/A Level: 4 N/A N/A Debridement A (sq cm): rea Blade, Curette, Scissors N/A N/A Instrument: Minimum N/A N/A Bleeding: Pressure N/A N/A Hemostasis A chieved: 0 N/A N/A Procedural Pain: 0 N/A N/A Post Procedural Pain: Procedure was tolerated well N/A N/A Debridement Treatment Response: 2.5x1.6x1.3 N/A N/A Post Debridement Measurements L x W x D (cm) 4.084 N/A N/A Post Debridement Volume: (cm) Unstageable/Unclassified N/A N/A Post Debridement Stage: Debridement N/A N/A Procedures Performed: Treatment Notes Wound #1 (Trochanter) Wound Laterality: Right Cleanser Normal Saline Discharge Instruction: Cleanse the wound with Normal Saline prior to applying a clean dressing using gauze sponges, not tissue or cotton balls. Soap  and Water Discharge Instruction: May shower and wash wound with dial antibacterial soap and water prior to dressing change. Wound Cleanser Discharge Instruction: Cleanse the wound with wound cleanser prior to applying a clean dressing using gauze sponges, not tissue or cotton balls. Peri-Wound Care Skin Prep Discharge Instruction: Use skin prep as directed Topical Primary Dressing Santyl Ointment Discharge Instruction: Apply nickel thick amount to wound bed as instructed Secondary Dressing Woven Gauze Sponge, Non-Sterile 4x4 in Discharge Instruction: Apply saline moistened gauze over primary dressing. Zetuvit Plus Silicone Border Dressing 5x5 (in/in) Discharge Instruction: Apply silicone border over primary dressing as directed. Secured With 29M Medipore H Soft Cloth Surgical T ape, 2x2 (in/yd) Discharge Instruction: Secure dressing with tape as directed. Compression Wrap Compression Stockings Add-Ons Electronic Signature(s) Signed: 11/12/2020 9:15:51 AM By: Geralyn Corwin DO Signed: 12/24/2020 8:00:33 AM By: Fonnie Mu RN Entered By: Geralyn Corwin on 11/12/2020 09:09:35 -------------------------------------------------------------------------------- Multi-Disciplinary Care Plan Details Patient Name: Date of Service: [name], [name] 11/12/2020 8:00 A M Medical Record Number: [id number] Patient Account Number: [id number] Date of Birth/Sex: Treating RN: 1940/07/28 (80 y.o. [name], [name] Primary Care Samaira Holzworth: [name] Other Clinician: Referring Rohn Fritsch: Treating Bertie Mcconathy/Extender: Loanne Drilling, Amy Weeks in Treatment: 6 Active Inactive Pressure Nursing Diagnoses: Knowledge deficit related to causes and risk factors for pressure ulcer development Knowledge deficit related to management of pressures ulcers Goals: Patient will remain free from development of additional pressure ulcers Date Initiated: 09/30/2020 Target Resolution Date:  12/13/2020 Goal Status: Active Patient/caregiver will verbalize understanding of pressure ulcer management Date Initiated: 09/30/2020 Target Resolution Date: 12/13/2020 Goal Status: Active Interventions: Assess: immobility, friction, shearing, incontinence upon admission and as needed Assess offloading mechanisms upon admission and as needed Assess potential for pressure ulcer upon admission and as needed Provide education on pressure ulcers Notes: Wound/Skin Impairment Nursing Diagnoses: Impaired tissue integrity Goals: Patient/caregiver will verbalize understanding of skin care regimen Date Initiated: 09/30/2020 Target Resolution Date: 12/13/2020 Goal Status: Active Ulcer/skin breakdown will have a volume reduction of 30% by week 4 Date Initiated: 09/30/2020 Target Resolution Date: 12/13/2020 Goal Status: Active Interventions: Assess patient/caregiver ability to obtain necessary supplies Assess patient/caregiver ability to perform ulcer/skin care regimen upon admission and as needed Assess ulceration(s) every visit Provide education on ulcer and skin care Treatment Activities: Skin care regimen initiated : 09/30/2020 Topical wound management initiated : 09/30/2020 Notes: Electronic Signature(s) Signed: 12/24/2020 8:00:33 AM By: Fonnie Mu RN Entered By: Fonnie Mu on 11/12/2020  08:33:24 -------------------------------------------------------------------------------- Pain Assessment Details Patient Name: Date of Service: [name], ALCOSER. 11/12/2020 8:00 A M Medical Record Number: [id number] Patient Account Number: [id number] Date of Birth/Sex: Treating RN: 03/31/1941 (80 y.o. Arta Silence Primary Care Berneice Zettlemoyer: [name] Other Clinician: Referring Montae Stager: Treating Keymon Mcelroy/Extender: Loanne Drilling, Amy Weeks in Treatment: 6 Active Problems Location of Pain Severity and Description of Pain Patient Has Paino No Site Locations Rate the pain. Current  Pain Level: 0 Pain Management and Medication Current Pain Management: Medication: No Cold Application: No Rest: No Massage: No Activity: No T.E.N.S.: No Heat Application: No Leg drop or elevation: No Is the Current Pain Management Adequate: Adequate How does your wound impact your activities of daily livingo Sleep: No Bathing: No Appetite: No Relationship With Others: No Bladder Continence: No Emotions: No Bowel Continence: No Work: No Toileting: No Drive: No Dressing: No Hobbies: No Electronic Signature(s) Signed: 11/12/2020 11:55:49 AM By: Shawn Stall Entered By: Shawn Stall on 11/12/2020 08:21:50 -------------------------------------------------------------------------------- Patient/Caregiver Education Details Patient Name: Date of Service: [name], [name] 8/2/2022andnbsp8:00 A M Medical Record Number: [id number] Patient Account Number: [id number] Date of Birth/Gender: Treating RN: 01/09/1941 (80 y.o. [name] Primary Care Physician: [name] Other Clinician: Referring Physician: Treating Physician/Extender: Earl Many Weeks in Treatment: 6 Education Assessment Education Provided To: Patient Education Topics Provided Wound/Skin Impairment: Methods: Explain/Verbal Responses: State content correctly Electronic Signature(s) Signed: 12/24/2020 8:00:33 AM By: Fonnie Mu RN Entered By: Fonnie Mu on 11/12/2020 08:33:44 -------------------------------------------------------------------------------- Wound Assessment Details Patient Name: Date of Service: [name], [name] 11/12/2020 8:00 A M Medical Record Number: [id number] Patient Account Number: [id number] Date of Birth/Sex: Treating RN: July 13, 1940 (80 y.o. [name], [name] Primary Care Othel Dicostanzo: [name] Other Clinician: Referring Haakon Titsworth: Treating Antwane Grose/Extender: Loanne Drilling, Amy Weeks in Treatment: 6 Wound Status Wound Number: 1  Primary Pressure Ulcer Etiology: Wound Location: Right Trochanter Wound Open Wounding Event: Pressure Injury Status: Date Acquired: 07/26/2020 Comorbid Cataracts, Anemia, Asthma, Hypertension, Peripheral Venous Weeks Of Treatment: 6 History: Disease, Osteoarthritis, Dementia Clustered Wound: No Photos Wound Measurements Length: (cm) 2.5 Width: (cm) 1.6 Depth: (cm) 1.3 Area: (cm) 3.142 Volume: (cm) 4.084 % Reduction in Area: 73.7% % Reduction in Volume: -14% Epithelialization: None Tunneling: No Undermining: Yes Starting Position (o'clock): 12 Ending Position (o'clock): 3 Maximum Distance: (cm) 0.9 Wound Description Classification: Unstageable/Unclassified Wound Margin: Epibole Exudate Amount: Medium Exudate Type: Purulent Exudate Color: yellow, brown, green Wound Bed Granulation Amount: Small (1-33%) Granulation Quality: Pink Necrotic Amount: Large (67-100%) Necrotic Quality: Adherent Slough Foul Odor After Cleansing: No Slough/Fibrino Yes Exposed Structure Fascia Exposed: No Fat Layer (Subcutaneous Tissue) Exposed: Yes Tendon Exposed: No Muscle Exposed: No Joint Exposed: No Bone Exposed: No Electronic Signature(s) Signed: 11/12/2020 11:55:49 AM By: Shawn Stall Signed: 12/24/2020 8:00:33 AM By: Fonnie Mu RN Entered By: Shawn Stall on 11/12/2020 08:22:23 -------------------------------------------------------------------------------- Vitals Details Patient Name: Date of Service: [name], [name] 11/12/2020 8:00 A M Medical Record Number: [id number] Patient Account Number: [id number] Date of Birth/Sex: Treating RN: 1940-05-27 (80 y.o. Arta Silence Primary Care Donovon Micheletti: [name] Other Clinician: Referring Gentry Pilson: Treating Clois Treanor/Extender: Loanne Drilling, Amy Weeks in Treatment: 6 Vital Signs Time Taken: 08:10 Temperature (F): 97.8 Pulse (bpm): 58 Respiratory Rate (breaths/min): 20 Blood Pressure (mmHg): 140/84 Reference  Range: 80 - 120 mg / dl Electronic Signature(s) Signed: 11/12/2020 11:55:49 AM By: Shawn Stall Entered By: Shawn Stall on 11/12/2020 08:21:42

## 2020-12-24 NOTE — Telephone Encounter (Signed)
Pt daughter called to see how many days needed for FMLA no answer left a voice mail to call the office back

## 2020-12-25 DIAGNOSIS — Z0279 Encounter for issue of other medical certificate: Secondary | ICD-10-CM

## 2020-12-25 NOTE — Telephone Encounter (Signed)
Patient's daughter, Carroll Sage, came by the office and paid the form fee of $25.00

## 2020-12-25 NOTE — Telephone Encounter (Signed)
[name] called to see about the FMLA papers. Her sister will be coming today to pick them up, b/c she needs them by tomorrow. Any questions call elaine.

## 2020-12-25 NOTE — Telephone Encounter (Signed)
Spoke with pt daughter she wasn't  10 days a month. She was advised that we would call her when paperwork was ready

## 2020-12-26 ENCOUNTER — Encounter (HOSPITAL_BASED_OUTPATIENT_CLINIC_OR_DEPARTMENT_OTHER): Payer: MEDICARE | Admitting: Internal Medicine

## 2020-12-26 NOTE — Telephone Encounter (Signed)
I spoke to daughter Angelik to confirm 10 days/month request. I let her know form is done, she requests to fax to number on form. Thanks

## 2020-12-26 NOTE — Telephone Encounter (Signed)
FMLA paperwork faxed

## 2020-12-31 ENCOUNTER — Other Ambulatory Visit: Payer: Self-pay

## 2020-12-31 ENCOUNTER — Other Ambulatory Visit: Payer: Self-pay | Admitting: Adult Health

## 2020-12-31 ENCOUNTER — Encounter (HOSPITAL_BASED_OUTPATIENT_CLINIC_OR_DEPARTMENT_OTHER): Payer: MEDICARE | Admitting: Internal Medicine

## 2020-12-31 ENCOUNTER — Other Ambulatory Visit: Payer: Self-pay | Admitting: Orthopedic Surgery

## 2020-12-31 DIAGNOSIS — L89210 Pressure ulcer of right hip, unstageable: Secondary | ICD-10-CM | POA: Diagnosis not present

## 2020-12-31 DIAGNOSIS — G301 Alzheimer's disease with late onset: Secondary | ICD-10-CM

## 2020-12-31 DIAGNOSIS — K5901 Slow transit constipation: Secondary | ICD-10-CM

## 2020-12-31 DIAGNOSIS — I10 Essential (primary) hypertension: Secondary | ICD-10-CM

## 2020-12-31 MED ORDER — HYDROCORTISONE (PERIANAL) 2.5 % EX CREA: 1.0000 "application " | TOPICAL_CREAM | Freq: Two times a day (BID) | CUTANEOUS | 0 refills | Status: DC

## 2020-12-31 NOTE — Progress Notes (Signed)
[name], [name] ([id number]) Visit Report for 12/31/2020 Arrival Information Details Patient Name: Date of Service: [name], [name] 12/31/2020 3:45 PM Medical Record Number: [id number] Patient Account Number: [id number] Date of Birth/Sex: Treating RN: 1940-09-23 (80 y.o. [name] Primary Care Zannie Runkle: [name] Other Clinician: Referring Dan Dissinger: Treating Oluwatimileyin Vivier/Extender: Loanne Drilling, Amy Weeks in Treatment: 13 Visit Information History Since Last Visit Added or deleted any medications: No Patient Arrived: Wheel Chair Any new allergies or adverse reactions: No Arrival Time: 15:29 Had a fall or experienced change in No Accompanied By: daughter activities of daily living that may affect Transfer Assistance: EasyPivot Patient Lift risk of falls: Patient Identification Verified: Yes Signs or symptoms of abuse/neglect since last visito No Secondary Verification Process Completed: Yes Hospitalized since last visit: No Patient Requires Transmission-Based Precautions: No Implantable device outside of the clinic excluding No Patient Has Alerts: Yes cellular tissue based products placed in the center Patient Alerts: Patient on Blood Thinner since last visit: Has Dressing in Place as Prescribed: Yes Pain Present Now: No Electronic Signature(s) Signed: 12/31/2020 6:03:30 PM By: Zenaida Deed RN, BSN Entered By: Zenaida Deed on 12/31/2020 15:38:49 -------------------------------------------------------------------------------- Clinic Level of Care Assessment Details Patient Name: Date of Service: [name], [name]. 12/31/2020 3:45 PM Medical Record Number: [id number] Patient Account Number: [id number] Date of Birth/Sex: Treating RN: 1941-01-12 (80 y.o. [name] Primary Care Khaniya Tenaglia: [name] Other Clinician: Referring Aarilyn Dye: Treating Ger Ringenberg/Extender: Loanne Drilling, Amy Weeks in Treatment: 13 Clinic Level of Care Assessment  Items TOOL 4 Quantity Score []  - 0 Use when only an EandM is performed on FOLLOW-UP visit ASSESSMENTS - Nursing Assessment / Reassessment X- 1 10 Reassessment of Co-morbidities (includes updates in patient status) X- 1 5 Reassessment of Adherence to Treatment Plan ASSESSMENTS - Wound and Skin A ssessment / Reassessment X - Simple Wound Assessment / Reassessment - one wound 1 5 []  - 0 Complex Wound Assessment / Reassessment - multiple wounds []  - 0 Dermatologic / Skin Assessment (not related to wound area) ASSESSMENTS - Focused Assessment []  - 0 Circumferential Edema Measurements - multi extremities []  - 0 Nutritional Assessment / Counseling / Intervention []  - 0 Lower Extremity Assessment (monofilament, tuning fork, pulses) []  - 0 Peripheral Arterial Disease Assessment (using hand held doppler) ASSESSMENTS - Ostomy and/or Continence Assessment and Care []  - 0 Incontinence Assessment and Management []  - 0 Ostomy Care Assessment and Management (repouching, etc.) PROCESS - Coordination of Care X - Simple Patient / Family Education for ongoing care 1 15 []  - 0 Complex (extensive) Patient / Family Education for ongoing care X- 1 10 Staff obtains , Records, T Results / Process Orders est []  - 0 Staff telephones HHA, Nursing Homes / Clarify orders / etc []  - 0 Routine Transfer to another Facility (non-emergent condition) []  - 0 Routine Hospital Admission (non-emergent condition) []  - 0 New Admissions / / Ordering NPWT Apligraf, etc. , []  - 0 Emergency Hospital Admission (emergent condition) X- 1 10 Simple Discharge Coordination []  - 0 Complex (extensive) Discharge Coordination PROCESS - Special Needs []  - 0 Pediatric / Minor Patient Management []  - 0 Isolation Patient Management []  - 0 Hearing / Language / Visual special needs []  - 0 Assessment of Community assistance (transportation, D/C planning, etc.) []  - 0 Additional  assistance / Altered mentation []  - 0 Support Surface(s) Assessment (bed, cushion, seat, etc.) INTERVENTIONS - Wound Cleansing / Measurement X - Simple Wound Cleansing - one wound 1 5 []  -  0 Complex Wound Cleansing - multiple wounds X- 1 5 Wound Imaging (photographs - any number of wounds) []  - 0 Wound Tracing (instead of photographs) X- 1 5 Simple Wound Measurement - one wound []  - 0 Complex Wound Measurement - multiple wounds INTERVENTIONS - Wound Dressings X - Small Wound Dressing one or multiple wounds 1 10 []  - 0 Medium Wound Dressing one or multiple wounds []  - 0 Large Wound Dressing one or multiple wounds []  - 0 Application of Medications - topical []  - 0 Application of Medications - injection INTERVENTIONS - Miscellaneous []  - 0 External ear exam []  - 0 Specimen Collection (cultures, biopsies, blood, body fluids, etc.) []  - 0 Specimen(s) / Culture(s) sent or taken to Lab for analysis []  - 0 Patient Transfer (multiple staff / / Similar devices) []  - 0 Simple Staple / Suture removal (25 or less) []  - 0 Complex Staple / Suture removal (26 or more) []  - 0 Hypo / Hyperglycemic Management (close monitor of Blood Glucose) []  - 0 Ankle / Brachial Index (ABI) - do not check if billed separately X- 1 5 Vital Signs Has the patient been seen at the hospital within the last three years: Yes Total Score: 85 Level Of Care: New/Established - Level 3 Electronic Signature(s) Signed: 12/31/2020 6:03:30 PM By: RN, BSN Entered By: on 12/31/2020 16:13:40 -------------------------------------------------------------------------------- Encounter Discharge Information Details Patient Name: Date of Service: , [name] 12/31/2020 3:45 PM Medical Record Number: Patient Account Number: Date of Birth/Sex: Treating RN: 05/31/1940 (80 y.o. Primary Care Zyden Suman: Other  Clinician: Referring Aliayah Tyer: Treating Kennedy Bohanon/Extender: , Amy Weeks in Treatment: 45 Encounter Discharge Information Items Discharge Condition: Stable Ambulatory Status: Wheelchair Discharge Destination: Home Transportation: Private Auto Accompanied By: daughter Schedule Follow-up Appointment: Yes Clinical Summary of Care: Patient Declined Electronic Signature(s) Signed: 12/31/2020 6:03:30 PM By: Zenaida Deed RN, BSN Entered By: Zenaida Deed on 12/31/2020 16:28:06 -------------------------------------------------------------------------------- Lower Extremity Assessment Details Patient Name: Date of Service: [name]. 12/31/2020 3:45 PM Medical Record Number: [id number] Patient Account Number: [id number] Date of Birth/Sex: Treating RN: 1940-06-08 (79 y.o. [name] Primary Care Kaydense Rizo: [name] Other Clinician: Referring Georgiana Spillane: Treating Adelyn Roscher/Extender: Loanne Drilling, Amy Weeks in Treatment: 13 Electronic Signature(s) Signed: 12/31/2020 6:03:30 PM By: 01/02/2021 RN, BSN Entered By: Zenaida Deed on 12/31/2020 15:39:59 -------------------------------------------------------------------------------- Multi Wound Chart Details Patient Name: Date of Service: 01/02/2021. 12/31/2020 3:45 PM Medical Record Number: 01/02/2021 Patient Account Number: [id number] Date of Birth/Sex: Treating RN: [birth year] (80 y.o. 72 Primary Care Javious Hallisey: [name] Other Clinician: Referring Liahm Grivas: Treating Rainah Kirshner/Extender: [name], Amy Weeks in Treatment: 13 Vital Signs Height(in): Pulse(bpm): 66 Weight(lbs): Blood Pressure(mmHg): 145/79 Body Mass Index(BMI): Temperature(F): 98.7 Respiratory Rate(breaths/min): 18 Photos: [N/A:N/A] Right Trochanter N/A N/A Wound Location: Pressure Injury N/A N/A Wounding Event: Pressure Ulcer N/A N/A Primary Etiology: Cataracts, Anemia, Asthma,  N/A N/A Comorbid History: Hypertension, Peripheral Venous Disease, Osteoarthritis, Dementia 07/26/2020 N/A N/A Date Acquired: 13 N/A N/A Weeks of Treatment: Open N/A N/A Wound Status: 0.9x0.8x4.5 N/A N/A Measurements L x W x D (cm) 0.565 N/A N/A A (cm) : rea 2.545 N/A N/A Volume (cm) : 95.30% N/A N/A % Reduction in A rea: 28.90% N/A N/A % Reduction in Volume: Category/Stage III N/A N/A Classification: Medium N/A N/A Exudate A mount: Serosanguineous N/A N/A Exudate Type: red, brown N/A N/A Exudate Color: Epibole N/A N/A Wound Margin:  Large (67-100%) N/A N/A Granulation A mount: Red N/A N/A Granulation Quality: None Present (0%) N/A N/A Necrotic A mount: Fat Layer (Subcutaneous Tissue): Yes N/A N/A Exposed Structures: Fascia: No Tendon: No Muscle: No Joint: No Bone: No Small (1-33%) N/A N/A Epithelialization: Treatment Notes Electronic Signature(s) Signed: 12/31/2020 4:22:19 PM By: Geralyn Corwin DO Signed: 12/31/2020 5:49:24 PM By: Shawn Stall RN, BSN Entered By: Geralyn Corwin on 12/31/2020 16:15:32 -------------------------------------------------------------------------------- Multi-Disciplinary Care Plan Details Patient Name: Date of Service: [name], [name] Mae. 12/31/2020 3:45 PM Medical Record Number: [id number] Patient Account Number: [id number] Date of Birth/Sex: Treating RN: 1940/05/07 (80 y.o. [name] Primary Care Domique Clapper: [name] Other Clinician: Referring Shaddai Shapley: Treating Tevis Dunavan/Extender: Loanne Drilling, Amy Weeks in Treatment: 13 Active Inactive Pressure Nursing Diagnoses: Knowledge deficit related to causes and risk factors for pressure ulcer development Knowledge deficit related to management of pressures ulcers Goals: Patient will remain free from development of additional pressure ulcers Date Initiated: 09/30/2020 Target Resolution Date: 01/02/2021 Goal Status: Active Patient/caregiver will verbalize  understanding of pressure ulcer management Date Initiated: 09/30/2020 Target Resolution Date: 01/02/2021 Goal Status: Active Interventions: Assess: immobility, friction, shearing, incontinence upon admission and as needed Assess offloading mechanisms upon admission and as needed Assess potential for pressure ulcer upon admission and as needed Provide education on pressure ulcers Notes: Wound/Skin Impairment Nursing Diagnoses: Impaired tissue integrity Goals: Patient/caregiver will verbalize understanding of skin care regimen Date Initiated: 09/30/2020 Target Resolution Date: 01/03/2021 Goal Status: Active Ulcer/skin breakdown will have a volume reduction of 30% by week 4 Date Initiated: 09/30/2020 Target Resolution Date: 01/02/2021 Goal Status: Active Interventions: Assess patient/caregiver ability to obtain necessary supplies Assess patient/caregiver ability to perform ulcer/skin care regimen upon admission and as needed Assess ulceration(s) every visit Provide education on ulcer and skin care Treatment Activities: Skin care regimen initiated : 09/30/2020 Topical wound management initiated : 09/30/2020 Notes: Electronic Signature(s) Signed: 12/31/2020 6:03:30 PM By: Zenaida Deed RN, BSN Entered By: Zenaida Deed on 12/31/2020 15:51:38 -------------------------------------------------------------------------------- Pain Assessment Details Patient Name: Date of Service: [name]. 12/31/2020 3:45 PM Medical Record Number: [id number] Patient Account Number: [id number] Date of Birth/Sex: Treating RN: 04-24-1940 (80 y.o. [name] Primary Care Enzo Treu: [name] Other Clinician: Referring Tyvion Edmondson: Treating Brok Stocking/Extender: Loanne Drilling, Amy Weeks in Treatment: 13 Active Problems Location of Pain Severity and Description of Pain Patient Has Paino No Site Locations Rate the pain. Current Pain Level: 0 Worst Pain Level: 4 Least Pain Level:  0 Character of Pain Describe the Pain: Tender Pain Management and Medication Current Pain Management: Is the Current Pain Management Adequate: Adequate How does your wound impact your activities of daily livingo Sleep: No Bathing: No Appetite: No Relationship With Others: No Bladder Continence: No Emotions: No Bowel Continence: No Work: No Toileting: No Drive: No Dressing: No Hobbies: No Electronic Signature(s) Signed: 12/31/2020 6:03:30 PM By: Zenaida Deed RN, BSN Entered By: Zenaida Deed on 12/31/2020 15:39:49 -------------------------------------------------------------------------------- Patient/Caregiver Education Details Patient Name: Date of Service: [name] 9/20/2022andnbsp3:45 PM Medical Record Number: [id number] Patient Account Number: [id number] Date of Birth/Gender: Treating RN: 17-May-1940 (80 y.o. [name] Primary Care Physician: Coletta Memos, [place] Other Clinician: Referring Physician: Treating Physician/Extender: Earl Many Weeks in Treatment: 13 Education Assessment Education Provided To: Patient Education Topics Provided Pressure: Methods: Explain/Verbal Responses: Reinforcements needed, State content correctly Wound/Skin Impairment: Methods: Explain/Verbal Responses: Reinforcements needed, State content correctly Electronic Signature(s) Signed: 12/31/2020 6:03:30 PM By: Zenaida Deed RN, BSN Entered By: Zenaida Deed on  12/31/2020 15:51:57 -------------------------------------------------------------------------------- Wound Assessment Details Patient Name: Date of Service: [name], [name] 12/31/2020 3:45 PM Medical Record Number: [id number] Patient Account Number: [id number] Date of Birth/Sex: Treating RN: 1940-09-13 (80 y.o. [name] Primary Care Azizi Bally: Coletta Memos, [place] Other Clinician: Referring Jazlin Tapscott: Treating Aldea Avis/Extender: Loanne Drilling, Amy Weeks in Treatment: 13 Wound  Status Wound Number: 1 Primary Pressure Ulcer Etiology: Wound Location: Right Trochanter Wound Open Wounding Event: Pressure Injury Status: Date Acquired: 07/26/2020 Comorbid Cataracts, Anemia, Asthma, Hypertension, Peripheral Venous Weeks Of Treatment: 13 History: Disease, Osteoarthritis, Dementia Clustered Wound: No Photos Wound Measurements Length: (cm) 0.9 Width: (cm) 0.8 Depth: (cm) 4.5 Area: (cm) 0.565 Volume: (cm) 2.545 % Reduction in Area: 95.3% % Reduction in Volume: 28.9% Epithelialization: Small (1-33%) Tunneling: No Undermining: No Wound Description Classification: Category/Stage III Wound Margin: Epibole Exudate Amount: Medium Exudate Type: Serosanguineous Exudate Color: red, brown Foul Odor After Cleansing: No Slough/Fibrino No Wound Bed Granulation Amount: Large (67-100%) Exposed Structure Granulation Quality: Red Fascia Exposed: No Necrotic Amount: None Present (0%) Fat Layer (Subcutaneous Tissue) Exposed: Yes Tendon Exposed: No Muscle Exposed: No Joint Exposed: No Bone Exposed: No Treatment Notes Wound #1 (Trochanter) Wound Laterality: Right Cleanser Normal Saline Discharge Instruction: Cleanse the wound with Normal Saline prior to applying a clean dressing using gauze sponges, not tissue or cotton balls. Soap and Water Discharge Instruction: May shower and wash wound with dial antibacterial soap and water prior to dressing change. Wound Cleanser Discharge Instruction: Cleanse the wound with wound cleanser prior to applying a clean dressing using gauze sponges, not tissue or cotton balls. Peri-Wound Care Skin Prep Discharge Instruction: Use skin prep as directed Topical Primary Dressing Iodoform packing strip 1/4 (in) Discharge Instruction: Lightly pack as instructed Secondary Dressing Woven Gauze Sponge, Non-Sterile 4x4 in Discharge Instruction: Apply saline moistened gauze over primary dressing. Zetuvit Plus Silicone Border Dressing 5x5  (in/in) Discharge Instruction: Apply silicone border over primary dressing as directed. Secured With 35M Medipore H Soft Cloth Surgical T 4 x 2 (in/yd) ape Discharge Instruction: Secure dressing with tape as directed. Compression Wrap Compression Stockings Add-Ons Electronic Signature(s) Signed: 12/31/2020 5:49:24 PM By: Shawn Stall RN, BSN Signed: 12/31/2020 6:03:30 PM By: Zenaida Deed RN, BSN Entered By: Shawn Stall on 12/31/2020 15:50:47 -------------------------------------------------------------------------------- Vitals Details Patient Name: Date of Service: [name], [place] W. 12/31/2020 3:45 PM Medical Record Number: [id number] Patient Account Number: [id number] Date of Birth/Sex: Treating RN: 10-24-1940 (80 y.o. [name] Primary Care Dorleen Kissel: Coletta Memos, [place] Other Clinician: Referring Finleigh Cheong: Treating Lauriana Denes/Extender: Loanne Drilling, Amy Weeks in Treatment: 13 Vital Signs Time Taken: 15:38 Temperature (F): 98.7 Pulse (bpm): 66 Respiratory Rate (breaths/min): 18 Blood Pressure (mmHg): 145/79 Reference Range: 80 - 120 mg / dl Electronic Signature(s) Signed: 12/31/2020 6:03:30 PM By: Zenaida Deed RN, BSN Entered By: Zenaida Deed on 12/31/2020 15:39:13

## 2020-12-31 NOTE — Progress Notes (Signed)
[name] ([id number]) Visit Report for 12/31/2020 Chief Complaint Document Details Patient Name: Date of Service: [name], [name] 12/31/2020 3:45 PM Medical Record Number: [id number] Patient Account Number: [id number] Date of Birth/Sex: Treating RN: [birth year] (80 y.o. [name] Primary Care Provider: [name] Other Clinician: Referring Provider: Treating Provider/Extender: Loanne Drilling, Amy Weeks in Treatment: 13 Information Obtained from: Patient Chief Complaint Right trochanter wound Electronic Signature(s) Signed: 12/31/2020 4:22:19 PM By: Geralyn Corwin DO Entered By: Geralyn Corwin on 12/31/2020 16:15:39 -------------------------------------------------------------------------------- HPI Details Patient Name: Date of Service: [name], [name] 12/31/2020 3:45 PM Medical Record Number: [id number] Patient Account Number: [id number] Date of Birth/Sex: Treating RN: 1940/08/20 (80 y.o. [name] Primary Care Provider: [name] Other Clinician: Referring Provider: Treating Provider/Extender: Loanne Drilling, Amy Weeks in Treatment: 13 History of Present Illness HPI Description: Admission 6/20 [name] is a 80 year old female with a past medical history of dementia, hypertension that presents to the clinic for a 2-25-month history of right trochanter wound. Daughter is present and helps provide the history. Patient is not sure exactly how this wound occurred but was noticed one day by the daughter a couple of months ago. She has visited urgent care on 2 occasions for this issue. She has been treated with doxycycline and clindamycin. She did have a culture that showed Pseudomonas. She has not received any other antibiotics besides these two antibiotics. She does tend to sleep on her right side mostly. She is not very mobile and sits in a wheelchair most of the day. She reports mild pain to the area. She reports green drainage. She  denies increased warmth, erythema or purulent drainage. She did experience a fall 3 months ago and visited the ED for this. She ended up having stitches to her scalp. 6/27; patient presents for 1 week follow-up. She is present with her daughter who helps provide the history. She started using Santyl daily with dressing changes. She is also aggressively offloading the area. She overall feels well and has no complaints today. She denies signs of infection. 7/12; patient presents for 1 week follow-up. Her daughter is present and helps provide the history. They have been aggressively offloading the area the best they can. She has changed her bed around to make this happen. She has been using Santyl with wet-to-dry gauze backing. Patient is in better spirits today. She overall feels well and has no complaints today. She denies signs of infection. 8/2; patient presents for 2-week follow-up. Daughter is present and helps provide the history. She has been using Santyl with wet-to-dry backing. There are no issues or complaints today. No systemic signs of infection reported. Overall patient feels well. She continues to offload the area well. 8/15; patient presents for 2-week follow-up. She has no issues or complaints today. She denies signs of infection. 8/26; patient presents for 2-week follow-up. She continues to use Santyl on the wound bed. Daughter is present. There are no issues or complaints today. No signs of infection. 9/8; patient presents for 2-week follow-up. She has been using Santyl on the wound bed. There are no issues or complaints today. She denies systemic signs of infection. 9/20; patient presents for follow-up. She has been using iodoform packing with Santyl at the base of the open wound. She reports no issues and has no complaints today. She denies signs of infection. Electronic Signature(s) Signed: 12/31/2020 4:22:19 PM By: Geralyn Corwin DO Entered By: Geralyn Corwin on 12/31/2020  16:18:57 -------------------------------------------------------------------------------- Physical Exam  Details Patient Name: Date of Service: [name] 12/31/2020 3:45 PM Medical Record Number: [id number] Patient Account Number: [id number] Date of Birth/Sex: Treating RN: 1941-02-27 (80 y.o. [name] Primary Care Provider: [name] Other Clinician: Referring Provider: Treating Provider/Extender: Loanne Drilling, Amy Weeks in Treatment: 13 Constitutional respirations regular, non-labored and within target range for patient.. Cardiovascular 2+ dorsalis pedis/posterior tibialis pulses. Psychiatric pleasant and cooperative. Notes Right lower extremity: Right trochanter with an open wound with improvement in depth. No slough noted. No drainage noted. No signs of soft tissue infection. Electronic Signature(s) Signed: 12/31/2020 4:22:19 PM By: Geralyn Corwin DO Entered By: Geralyn Corwin on 12/31/2020 16:19:34 -------------------------------------------------------------------------------- Physician Orders Details Patient Name: Date of Service: [name], [name] 12/31/2020 3:45 PM Medical Record Number: [id number] Patient Account Number: [id number] Date of Birth/Sex: Treating RN: 04/23/1940 (80 y.o. [name] Primary Care Provider: [name] Other Clinician: Referring Provider: Treating Provider/Extender: Loanne Drilling, Amy Weeks in Treatment: 53 Verbal / Phone Orders: No Diagnosis Coding ICD-10 Coding Code Description L89.210 Pressure ulcer of right hip, unstageable G30.1 Alzheimer's disease with late onset Follow-up Appointments ppointment in 2 weeks. - Dr. Mikey Bussing Return A Bathing/ Shower/ Hygiene May shower and wash wound with soap and water. Off-Loading Other: - Keep off right hip when sleeping Additional Orders / Instructions Follow Nutritious Diet - Increase protein, 100-120g daily. Wound Treatment Wound #1 - Trochanter  Wound Laterality: Right Cleanser: Normal Saline (Generic) 1 x Per Day/30 Days Discharge Instructions: Cleanse the wound with Normal Saline prior to applying a clean dressing using gauze sponges, not tissue or cotton balls. Cleanser: Soap and Water 1 x Per Day/30 Days Discharge Instructions: May shower and wash wound with dial antibacterial soap and water prior to dressing change. Cleanser: Wound Cleanser (Generic) 1 x Per Day/30 Days Discharge Instructions: Cleanse the wound with wound cleanser prior to applying a clean dressing using gauze sponges, not tissue or cotton balls. Peri-Wound Care: Skin Prep (DME) (Generic) 1 x Per Day/30 Days Discharge Instructions: Use skin prep as directed Prim Dressing: Iodoform packing strip 1/4 (in) (DME) (Generic) 1 x Per Day/30 Days ary Discharge Instructions: Lightly pack as instructed Secondary Dressing: Woven Gauze Sponge, Non-Sterile 4x4 in (DME) (Generic) 1 x Per Day/30 Days Discharge Instructions: Apply saline moistened gauze over primary dressing. Secondary Dressing: Zetuvit Plus Silicone Border Dressing 5x5 (in/in) (DME) (Generic) 1 x Per Day/30 Days Discharge Instructions: Apply silicone border over primary dressing as directed. Secured With: 63M Medipore H Soft Cloth Surgical T 4 x 2 (in/yd) (DME) (Generic) 1 x Per Day/30 Days ape Discharge Instructions: Secure dressing with tape as directed. Electronic Signature(s) Signed: 12/31/2020 4:22:19 PM By: Geralyn Corwin DO Entered By: Geralyn Corwin on 12/31/2020 16:19:47 -------------------------------------------------------------------------------- Problem List Details Patient Name: Date of Service: [name], [name] 12/31/2020 3:45 PM Medical Record Number: [id number] Patient Account Number: [id number] Date of Birth/Sex: Treating RN: 11-15-1940 (80 y.o. [name] Primary Care Provider: [name] Other Clinician: Referring Provider: Treating Provider/Extender: Loanne Drilling, Amy Weeks in Treatment: 22 Active Problems ICD-10 Encounter Code Description Active Date MDM Diagnosis L89.210 Pressure ulcer of right hip, unstageable 09/30/2020 No Yes G30.1 Alzheimer's disease with late onset 09/30/2020 No Yes Inactive Problems Resolved Problems Electronic Signature(s) Signed: 12/31/2020 4:22:19 PM By: Geralyn Corwin DO Entered By: Geralyn Corwin on 12/31/2020 16:15:27 -------------------------------------------------------------------------------- Progress Note Details Patient Name: Date of Service: [name], [name] 12/31/2020 3:45 PM Medical Record Number: [id number] Patient Account Number: [id number] Date of  Birth/Sex: Treating RN: 1941-02-27 (80 y.o. [name] Primary Care Provider: [name] Other Clinician: Referring Provider: Treating Provider/Extender: Loanne Drilling, Amy Weeks in Treatment: 13 Subjective Chief Complaint Information obtained from Patient Right trochanter wound History of Present Illness (HPI) Admission 6/20 Ms. Nanea Jared is a 80 year old female with a past medical history of dementia, hypertension that presents to the clinic for a 2-58-month history of right trochanter wound. Daughter is present and helps provide the history. Patient is not sure exactly how this wound occurred but was noticed one day by the daughter a couple of months ago. She has visited urgent care on 2 occasions for this issue. She has been treated with doxycycline and clindamycin. She did have a culture that showed Pseudomonas. She has not received any other antibiotics besides these two antibiotics. She does tend to sleep on her right side mostly. She is not very mobile and sits in a wheelchair most of the day. She reports mild pain to the area. She reports green drainage. She denies increased warmth, erythema or purulent drainage. She did experience a fall 3 months ago and visited the ED for this. She ended up having stitches to  her scalp. 6/27; patient presents for 1 week follow-up. She is present with her daughter who helps provide the history. She started using Santyl daily with dressing changes. She is also aggressively offloading the area. She overall feels well and has no complaints today. She denies signs of infection. 7/12; patient presents for 1 week follow-up. Her daughter is present and helps provide the history. They have been aggressively offloading the area the best they can. She has changed her bed around to make this happen. She has been using Santyl with wet-to-dry gauze backing. Patient is in better spirits today. She overall feels well and has no complaints today. She denies signs of infection. 8/2; patient presents for 2-week follow-up. Daughter is present and helps provide the history. She has been using Santyl with wet-to-dry backing. There are no issues or complaints today. No systemic signs of infection reported. Overall patient feels well. She continues to offload the area well. 8/15; patient presents for 2-week follow-up. She has no issues or complaints today. She denies signs of infection. 8/26; patient presents for 2-week follow-up. She continues to use Santyl on the wound bed. Daughter is present. There are no issues or complaints today. No signs of infection. 9/8; patient presents for 2-week follow-up. She has been using Santyl on the wound bed. There are no issues or complaints today. She denies systemic signs of infection. 9/20; patient presents for follow-up. She has been using iodoform packing with Santyl at the base of the open wound. She reports no issues and has no complaints today. She denies signs of infection. Patient History Unable to Obtain Patient History due to Dementia. Information obtained from Patient. Family History Heart Disease - Mother,Father,Siblings, Hypertension - Mother,Father,Siblings, Lung Disease - Child, No family history of Cancer, Diabetes, Hereditary  Spherocytosis, Kidney Disease, Seizures, Stroke, Thyroid Problems, Tuberculosis. Social History Former smoker, Marital Status - Widowed, Alcohol Use - Never, Drug Use - No History, Caffeine Use - Rarely. Medical History Eyes Patient has history of Cataracts - Cataract Surgery Hematologic/Lymphatic Patient has history of Anemia Respiratory Patient has history of Asthma Cardiovascular Patient has history of Hypertension, Peripheral Venous Disease Musculoskeletal Patient has history of Osteoarthritis Neurologic Patient has history of Dementia Medical A Surgical History Notes nd Gastrointestinal GERD Psychiatric Depression Objective Constitutional respirations regular, non-labored and within  target range for patient.. Vitals Time Taken: 3:38 PM, Temperature: 98.7 F, Pulse: 66 bpm, Respiratory Rate: 18 breaths/min, Blood Pressure: 145/79 mmHg. Cardiovascular 2+ dorsalis pedis/posterior tibialis pulses. Psychiatric pleasant and cooperative. General Notes: Right lower extremity: Right trochanter with an open wound with improvement in depth. No slough noted. No drainage noted. No signs of soft tissue infection. Integumentary (Hair, Skin) Wound #1 status is Open. Original cause of wound was Pressure Injury. The date acquired was: 07/26/2020. The wound has been in treatment 13 weeks. The wound is located on the Right Trochanter. The wound measures 0.9cm length x 0.8cm width x 4.5cm depth; 0.565cm^2 area and 2.545cm^3 volume. There is Fat Layer (Subcutaneous Tissue) exposed. There is no tunneling or undermining noted. There is a medium amount of serosanguineous drainage noted. The wound margin is epibole. There is large (67-100%) red granulation within the wound bed. There is no necrotic tissue within the wound bed. Assessment Active Problems ICD-10 Pressure ulcer of right hip, unstageable Alzheimer's disease with late onset Patient's wound has shown improvement in size and appearance  since last clinic visit. I recommended stopping Santyl and she can just use iodoform packing daily. No signs of infection on exam. Wound culture showed no growth. Plan Follow-up Appointments: Return Appointment in 2 weeks. - Dr. Mikey Bussing Bathing/ Shower/ Hygiene: May shower and wash wound with soap and water. Off-Loading: Other: - Keep off right hip when sleeping Additional Orders / Instructions: Follow Nutritious Diet - Increase protein, 100-120g daily. WOUND #1: - Trochanter Wound Laterality: Right Cleanser: Normal Saline (Generic) 1 x Per Day/30 Days Discharge Instructions: Cleanse the wound with Normal Saline prior to applying a clean dressing using gauze sponges, not tissue or cotton balls. Cleanser: Soap and Water 1 x Per Day/30 Days Discharge Instructions: May shower and wash wound with dial antibacterial soap and water prior to dressing change. Cleanser: Wound Cleanser (Generic) 1 x Per Day/30 Days Discharge Instructions: Cleanse the wound with wound cleanser prior to applying a clean dressing using gauze sponges, not tissue or cotton balls. Peri-Wound Care: Skin Prep (DME) (Generic) 1 x Per Day/30 Days Discharge Instructions: Use skin prep as directed Prim Dressing: Iodoform packing strip 1/4 (in) (DME) (Generic) 1 x Per Day/30 Days ary Discharge Instructions: Lightly pack as instructed Secondary Dressing: Woven Gauze Sponge, Non-Sterile 4x4 in (DME) (Generic) 1 x Per Day/30 Days Discharge Instructions: Apply saline moistened gauze over primary dressing. Secondary Dressing: Zetuvit Plus Silicone Border Dressing 5x5 (in/in) (DME) (Generic) 1 x Per Day/30 Days Discharge Instructions: Apply silicone border over primary dressing as directed. Secured With: 70M Medipore H Soft Cloth Surgical T 4 x 2 (in/yd) (DME) (Generic) 1 x Per Day/30 Days ape Discharge Instructions: Secure dressing with tape as directed. 1. Continue iodoform packing 2. Follow-up in 2 weeks Electronic  Signature(s) Signed: 12/31/2020 4:22:19 PM By: Geralyn Corwin DO Entered By: Geralyn Corwin on 12/31/2020 16:21:09 -------------------------------------------------------------------------------- HxROS Details Patient Name: Date of Service: [name], [name] 12/31/2020 3:45 PM Medical Record Number: [id number] Patient Account Number: [id number] Date of Birth/Sex: Treating RN: 03/29/1941 (80 y.o. [name] Primary Care Provider: [name] Other Clinician: Referring Provider: Treating Provider/Extender: Loanne Drilling, Amy Weeks in Treatment: 55 Unable to Obtain Patient History due to Dementia Information Obtained From Patient Eyes Medical History: Positive for: Cataracts - Cataract Surgery Hematologic/Lymphatic Medical History: Positive for: Anemia Respiratory Medical History: Positive for: Asthma Cardiovascular Medical History: Positive for: Hypertension; Peripheral Venous Disease Gastrointestinal Medical History: Past Medical History Notes: GERD Musculoskeletal Medical  History: Positive for: Osteoarthritis Neurologic Medical History: Positive for: Dementia Psychiatric Medical History: Past Medical History Notes: Depression HBO Extended History Items Eyes: Cataracts Immunizations Pneumococcal Vaccine: Received Pneumococcal Vaccination: No Implantable Devices None Family and Social History Cancer: No; Diabetes: No; Heart Disease: Yes - Mother,Father,Siblings; Hereditary Spherocytosis: No; Hypertension: Yes - Mother,Father,Siblings; Kidney Disease: No; Lung Disease: Yes - Child; Seizures: No; Stroke: No; Thyroid Problems: No; Tuberculosis: No; Former smoker; Marital Status - Widowed; Alcohol Use: Never; Drug Use: No History; Caffeine Use: Rarely; Financial Concerns: No; Food, Clothing or Shelter Needs: No; Support System Lacking: No; Transportation Concerns: No Electronic Signature(s) Signed: 12/31/2020 4:22:19 PM By: Geralyn Corwin  DO Signed: 12/31/2020 5:49:24 PM By: Shawn Stall RN, BSN Entered By: Geralyn Corwin on 12/31/2020 16:19:02 -------------------------------------------------------------------------------- SuperBill Details Patient Name: Date of Service: [name], [name] 12/31/2020 Medical Record Number: [id number] Patient Account Number: [id number] Date of Birth/Sex: Treating RN: 1940/09/08 (80 y.o. [name] Primary Care Provider: [name] Other Clinician: Referring Provider: Treating Provider/Extender: Loanne Drilling, Amy Weeks in Treatment: 13 Diagnosis Coding ICD-10 Codes Code Description L89.210 Pressure ulcer of right hip, unstageable G30.1 Alzheimer's disease with late onset Facility Procedures CPT4 Code: 16073710 Description: 99213 - WOUND CARE VISIT-LEV 3 EST PT Modifier: Quantity: 1 Physician Procedures : CPT4 Code Description Modifier [id number] - WC PHYS LEVEL 3 - EST PT ICD-10 Diagnosis Description L89.210 Pressure ulcer of right hip, unstageable G30.1 Alzheimer's disease with late onset Quantity: 1 Electronic Signature(s) Signed: 12/31/2020 4:22:19 PM By: Geralyn Corwin DO Entered By: Geralyn Corwin on 12/31/2020 16:22:04

## 2021-01-06 ENCOUNTER — Other Ambulatory Visit: Payer: Self-pay | Admitting: Orthopedic Surgery

## 2021-01-06 DIAGNOSIS — I10 Essential (primary) hypertension: Secondary | ICD-10-CM

## 2021-01-12 ENCOUNTER — Inpatient Hospital Stay (HOSPITAL_COMMUNITY)
Admission: EM | Admit: 2021-01-12 | Discharge: 2021-01-17 | DRG: 689 | Disposition: A | Discharge status: Home Health | Payer: MEDICARE | Attending: Internal Medicine | Admitting: Internal Medicine

## 2021-01-12 ENCOUNTER — Other Ambulatory Visit: Payer: Self-pay

## 2021-01-12 ENCOUNTER — Emergency Department (HOSPITAL_COMMUNITY): Payer: MEDICARE

## 2021-01-12 ENCOUNTER — Observation Stay (HOSPITAL_COMMUNITY): Payer: MEDICARE

## 2021-01-12 DIAGNOSIS — Z79899 Other long term (current) drug therapy: Secondary | ICD-10-CM

## 2021-01-12 DIAGNOSIS — Z20822 Contact with and (suspected) exposure to covid-19: Secondary | ICD-10-CM | POA: Diagnosis present

## 2021-01-12 DIAGNOSIS — G9340 Encephalopathy, unspecified: Secondary | ICD-10-CM | POA: Diagnosis not present

## 2021-01-12 DIAGNOSIS — E785 Hyperlipidemia, unspecified: Secondary | ICD-10-CM | POA: Diagnosis present

## 2021-01-12 DIAGNOSIS — Z87891 Personal history of nicotine dependence: Secondary | ICD-10-CM

## 2021-01-12 DIAGNOSIS — N390 Urinary tract infection, site not specified: Secondary | ICD-10-CM | POA: Diagnosis not present

## 2021-01-12 DIAGNOSIS — K5900 Constipation, unspecified: Secondary | ICD-10-CM

## 2021-01-12 DIAGNOSIS — F419 Anxiety disorder, unspecified: Secondary | ICD-10-CM | POA: Diagnosis present

## 2021-01-12 DIAGNOSIS — F02818 Dementia in other diseases classified elsewhere, unspecified severity, with other behavioral disturbance: Secondary | ICD-10-CM | POA: Diagnosis present

## 2021-01-12 DIAGNOSIS — Z91040 Latex allergy status: Secondary | ICD-10-CM

## 2021-01-12 DIAGNOSIS — Z9842 Cataract extraction status, left eye: Secondary | ICD-10-CM

## 2021-01-12 DIAGNOSIS — Z96643 Presence of artificial hip joint, bilateral: Secondary | ICD-10-CM | POA: Diagnosis present

## 2021-01-12 DIAGNOSIS — K5909 Other constipation: Secondary | ICD-10-CM | POA: Diagnosis present

## 2021-01-12 DIAGNOSIS — G9341 Metabolic encephalopathy: Secondary | ICD-10-CM

## 2021-01-12 DIAGNOSIS — N3001 Acute cystitis with hematuria: Secondary | ICD-10-CM

## 2021-01-12 DIAGNOSIS — I959 Hypotension, unspecified: Secondary | ICD-10-CM | POA: Diagnosis present

## 2021-01-12 DIAGNOSIS — Z888 Allergy status to other drugs, medicaments and biological substances status: Secondary | ICD-10-CM

## 2021-01-12 DIAGNOSIS — N8110 Cystocele, unspecified: Secondary | ICD-10-CM | POA: Diagnosis present

## 2021-01-12 DIAGNOSIS — I10 Essential (primary) hypertension: Secondary | ICD-10-CM | POA: Diagnosis present

## 2021-01-12 DIAGNOSIS — R630 Anorexia: Secondary | ICD-10-CM | POA: Diagnosis present

## 2021-01-12 DIAGNOSIS — G301 Alzheimer's disease with late onset: Secondary | ICD-10-CM | POA: Diagnosis present

## 2021-01-12 DIAGNOSIS — Z9841 Cataract extraction status, right eye: Secondary | ICD-10-CM

## 2021-01-12 DIAGNOSIS — F32A Depression, unspecified: Secondary | ICD-10-CM | POA: Diagnosis present

## 2021-01-12 DIAGNOSIS — I9589 Other hypotension: Secondary | ICD-10-CM | POA: Diagnosis present

## 2021-01-12 DIAGNOSIS — Z8249 Family history of ischemic heart disease and other diseases of the circulatory system: Secondary | ICD-10-CM

## 2021-01-12 DIAGNOSIS — Z96612 Presence of left artificial shoulder joint: Secondary | ICD-10-CM | POA: Diagnosis present

## 2021-01-12 DIAGNOSIS — R627 Adult failure to thrive: Secondary | ICD-10-CM | POA: Diagnosis present

## 2021-01-12 DIAGNOSIS — Z96652 Presence of left artificial knee joint: Secondary | ICD-10-CM | POA: Diagnosis present

## 2021-01-12 DIAGNOSIS — Z7982 Long term (current) use of aspirin: Secondary | ICD-10-CM

## 2021-01-12 DIAGNOSIS — Z6826 Body mass index (BMI) 26.0-26.9, adult: Secondary | ICD-10-CM

## 2021-01-12 DIAGNOSIS — L89219 Pressure ulcer of right hip, unspecified stage: Secondary | ICD-10-CM | POA: Diagnosis present

## 2021-01-12 DIAGNOSIS — Z882 Allergy status to sulfonamides status: Secondary | ICD-10-CM

## 2021-01-12 DIAGNOSIS — F0280 Dementia in other diseases classified elsewhere without behavioral disturbance: Secondary | ICD-10-CM | POA: Diagnosis present

## 2021-01-12 LAB — CBC WITH DIFFERENTIAL/PLATELET
Abs Immature Granulocytes: 0.01 10*3/uL (ref 0.00–0.07)
Basophils Absolute: 0.1 10*3/uL (ref 0.0–0.1)
Basophils Relative: 1 %
Eosinophils Absolute: 0.1 10*3/uL (ref 0.0–0.5)
Eosinophils Relative: 2 %
HCT: 39.7 % (ref 36.0–46.0)
Hemoglobin: 12.4 g/dL (ref 12.0–15.0)
Immature Granulocytes: 0 %
Lymphocytes Relative: 29 %
Lymphs Abs: 1.5 10*3/uL (ref 0.7–4.0)
MCH: 30.2 pg (ref 26.0–34.0)
MCHC: 31.2 g/dL (ref 30.0–36.0)
MCV: 96.6 fL (ref 80.0–100.0)
Monocytes Absolute: 0.5 10*3/uL (ref 0.1–1.0)
Monocytes Relative: 10 %
Neutro Abs: 2.9 10*3/uL (ref 1.7–7.7)
Neutrophils Relative %: 58 %
Platelets: 273 10*3/uL (ref 150–400)
RBC: 4.11 MIL/uL (ref 3.87–5.11)
RDW: 14.6 % (ref 11.5–15.5)
WBC: 5 10*3/uL (ref 4.0–10.5)
nRBC: 0 % (ref 0.0–0.2)

## 2021-01-12 LAB — RESP PANEL BY RT-PCR (FLU A&B, COVID) ARPGX2
Influenza A by PCR: NEGATIVE
Influenza B by PCR: NEGATIVE
SARS Coronavirus 2 by RT PCR: NEGATIVE

## 2021-01-12 LAB — T4, FREE: Free T4: 1.24 ng/dL — ABNORMAL HIGH (ref 0.61–1.12)

## 2021-01-12 LAB — COMPREHENSIVE METABOLIC PANEL
ALT: 14 U/L (ref 0–44)
AST: 27 U/L (ref 15–41)
Albumin: 4.5 g/dL (ref 3.5–5.0)
Alkaline Phosphatase: 70 U/L (ref 38–126)
Anion gap: 9 (ref 5–15)
BUN: 25 mg/dL — ABNORMAL HIGH (ref 8–23)
CO2: 25 mmol/L (ref 22–32)
Calcium: 9.8 mg/dL (ref 8.9–10.3)
Chloride: 104 mmol/L (ref 98–111)
Creatinine, Ser: 0.88 mg/dL (ref 0.44–1.00)
GFR, Estimated: >60 mL/min (ref >60)
Glucose, Bld: 89 mg/dL (ref 70–99)
Potassium: 4 mmol/L (ref 3.5–5.1)
Sodium: 138 mmol/L (ref 135–145)
Total Bilirubin: 0.8 mg/dL (ref 0.3–1.2)
Total Protein: 8.5 g/dL — ABNORMAL HIGH (ref 6.5–8.1)

## 2021-01-12 LAB — TSH: TSH: 0.771 u[IU]/mL (ref 0.350–4.500)

## 2021-01-12 LAB — URINALYSIS, COMPLETE (UACMP) WITH MICROSCOPIC
Bacteria, UA: NONE SEEN
Bilirubin Urine: NEGATIVE
Glucose, UA: NEGATIVE mg/dL
Ketones, ur: 5 mg/dL — AB
Nitrite: POSITIVE — AB
Protein, ur: NEGATIVE mg/dL
Specific Gravity, Urine: 1.015 (ref 1.005–1.030)
WBC, UA: >50 WBC/hpf — ABNORMAL HIGH (ref 0–5)
pH: 6 (ref 5.0–8.0)

## 2021-01-12 LAB — BLOOD GAS, VENOUS
Acid-Base Excess: 2.3 mmol/L — ABNORMAL HIGH (ref 0.0–2.0)
Bicarbonate: 27.4 mmol/L (ref 20.0–28.0)
O2 Saturation: 45 %
Patient temperature: 98.6
pCO2, Ven: 46.5 mmHg (ref 44.0–60.0)
pH, Ven: 7.387 (ref 7.250–7.430)
pO2, Ven: 28.2 mmHg — CL (ref 32.0–45.0)

## 2021-01-12 LAB — TROPONIN I (HIGH SENSITIVITY)
Troponin I (High Sensitivity): 11 ng/L (ref <18)
Troponin I (High Sensitivity): 16 ng/L (ref <18)

## 2021-01-12 LAB — PROTIME-INR
INR: 1.1 (ref 0.8–1.2)
Prothrombin Time: 14.2 seconds (ref 11.4–15.2)

## 2021-01-12 LAB — LACTIC ACID, PLASMA: Lactic Acid, Venous: 1.2 mmol/L (ref 0.5–1.9)

## 2021-01-12 LAB — CBG MONITORING, ED: Glucose-Capillary: 72 mg/dL (ref 70–99)

## 2021-01-12 MED ORDER — ASPIRIN EC 81 MG PO TBEC
81.0000 mg | DELAYED_RELEASE_TABLET | Freq: Every day | ORAL | Status: DC
  Administered 2021-01-12 – 2021-01-17 (×6): 81 mg via ORAL
  Filled 2021-01-12 (×6): qty 1

## 2021-01-12 MED ORDER — LACTATED RINGERS IV BOLUS
1000.0000 mL | Freq: Once | INTRAVENOUS | Status: AC
  Administered 2021-01-12: 1000 mL via INTRAVENOUS

## 2021-01-12 MED ORDER — POLYETHYLENE GLYCOL 3350 17 G PO PACK
17.0000 g | PACK | Freq: Two times a day (BID) | ORAL | Status: DC
  Administered 2021-01-13 – 2021-01-17 (×7): 17 g via ORAL
  Filled 2021-01-12 (×8): qty 1

## 2021-01-12 MED ORDER — SODIUM CHLORIDE 0.9 % IV SOLN
1.0000 g | Freq: Once | INTRAVENOUS | Status: AC
  Administered 2021-01-12: 1 g via INTRAVENOUS
  Filled 2021-01-12: qty 10

## 2021-01-12 MED ORDER — SODIUM CHLORIDE 0.9 % IV SOLN: Freq: Once | INTRAVENOUS | Status: AC

## 2021-01-12 MED ORDER — BISACODYL 10 MG RE SUPP: 10.0000 mg | Freq: Every day | RECTAL | Status: DC | PRN

## 2021-01-12 MED ORDER — MIRTAZAPINE 15 MG PO TABS
15.0000 mg | ORAL_TABLET | Freq: Every day | ORAL | Status: DC
  Administered 2021-01-12 – 2021-01-16 (×5): 15 mg via ORAL
  Filled 2021-01-12: qty 2
  Filled 2021-01-12 (×4): qty 1

## 2021-01-12 MED ORDER — SENNOSIDES-DOCUSATE SODIUM 8.6-50 MG PO TABS
2.0000 | ORAL_TABLET | Freq: Every day | ORAL | Status: DC
  Administered 2021-01-13 – 2021-01-17 (×5): 2 via ORAL
  Filled 2021-01-12 (×5): qty 2

## 2021-01-12 MED ORDER — ENOXAPARIN SODIUM 40 MG/0.4ML IJ SOSY
40.0000 mg | PREFILLED_SYRINGE | INTRAMUSCULAR | Status: DC
  Administered 2021-01-12 – 2021-01-16 (×5): 40 mg via SUBCUTANEOUS
  Filled 2021-01-12 (×5): qty 0.4

## 2021-01-12 MED ORDER — ONDANSETRON HCL 4 MG/2ML IJ SOLN: 4.0000 mg | Freq: Four times a day (QID) | INTRAMUSCULAR | Status: DC | PRN

## 2021-01-12 MED ORDER — ONDANSETRON HCL 4 MG PO TABS: 4.0000 mg | ORAL_TABLET | Freq: Four times a day (QID) | ORAL | Status: DC | PRN

## 2021-01-12 MED ORDER — MIDODRINE HCL 5 MG PO TABS
10.0000 mg | ORAL_TABLET | Freq: Two times a day (BID) | ORAL | Status: DC
  Administered 2021-01-13 – 2021-01-14 (×3): 10 mg via ORAL
  Filled 2021-01-12 (×3): qty 2

## 2021-01-12 MED ORDER — SODIUM CHLORIDE 0.9 % IV SOLN: 1.0000 g | INTRAVENOUS | Status: DC

## 2021-01-12 MED ORDER — HYDROXYZINE HCL 25 MG PO TABS
25.0000 mg | ORAL_TABLET | Freq: Every day | ORAL | Status: DC | PRN
  Administered 2021-01-13: 25 mg via ORAL
  Filled 2021-01-12: qty 1

## 2021-01-12 MED ORDER — SODIUM CHLORIDE 0.9 % IV SOLN
1.0000 g | INTRAVENOUS | Status: DC
  Administered 2021-01-13 – 2021-01-17 (×5): 1 g via INTRAVENOUS
  Filled 2021-01-12 (×5): qty 10

## 2021-01-12 NOTE — ED Triage Notes (Signed)
Family says pt orientation is not a baseline.  Per EMS family wanted her check out due to they feel she is not acting herself.  Pt has hx of dementia.  Pt has a wound on her right hip, pt has a prolapsed bladder.

## 2021-01-12 NOTE — Consult Note (Signed)
WOC Nurse wound consult note Consultation was completed by review of records, images and assistance from the bedside nurse/clinical staff.   Reason for Consult:right hip wound Wound type: right trochanter; Stage 3 Pressure Injury Followed by [facility], seen 12/31/20; reviewed notes Pressure Injury POA: Yes Measurement: per [facility] notes: 0.9cm x 0.8cm x 4.5cm  Wound IOM:BTDHR, pink Drainage (amount, consistency, odor)moderate; serosanguinous Periwound: intact  Dressing procedure/placement/frequency:  Flush wound with saline, using small syringe Pack wound with 1/4" iodoform gauze packing, top with silicone foam. Change packing daily, ok to change foam every 3 days as long as not saturated.   Follow up with wound care center as scheduled at DC  Re consult if needed, will not follow at this time. Thanks  Ranyah Groeneveld [employer], RN,CWOCN, CNS, CWON-AP [phone])

## 2021-01-12 NOTE — ED Notes (Signed)
Pt giveb dinner tray

## 2021-01-12 NOTE — ED Provider Notes (Signed)
[facility]-EMERGENCY DEPT Provider Note   CSN: [id number] Arrival date & time: 01/12/21  1240     History Chief Complaint  Patient presents with   Urinary Tract Infection     [name] is a 80 y.o. female.  HPI  80 year old female with past medical history below to include dementia, depression, HLD, HTN presenting to the emergency department with altered mental status.  The patient presents from home where she lives with family.  Family members noted this morning that the patient is not at her baseline mental status.  Family member at bedside (patient's son) could not further characterize the patient's baseline.  He states that normally she will attempt conversation and will be pleasantly confused.  Today, she has been not as verbal, with few words stated, no clear slurred speech or facial droop, appears globally weak with no focal deficits per family.  She was nonambulatory today.  She does have a history of a chronic wound along her right hip which was present on x-ray imaging back in June.  She sees wound care for this wound.  No fevers or chills.  No cough, shortness of breath.  Level 5 caveat due to patient dementia.  Past Medical History:  Diagnosis Date   Anemia    on iron   Anxiety    Arthritis    shoulders, knees, hands, hips; status post left knee surgery   Chronic bronchitis (HCC)    CXR 07/08/16   Dementia (HCC)    Depression    GERD (gastroesophageal reflux disease)    TAKES TUMS   H/O: pneumonia 2003   Hyperlipidemia    on med   Hypertension    Morbid obesity with BMI of 45.0-49.9, adult (HCC)    Shingles    recent 02/2017   Thoracic aortic atherosclerosis (HCC)    CXR 07/08/16    Patient Active Problem List   Diagnosis Date Noted   UTI (urinary tract infection) 01/13/2021   Constipation 01/13/2021   Acute metabolic encephalopathy 01/12/2021   Failure to thrive in adult 05/20/2020   Venous insufficiency 05/20/2020   Depression,  major, single episode, in partial remission (HCC) 05/20/2020   Late onset Alzheimer's dementia with behavioral disturbance (HCC) 05/20/2020   History of fall 04/05/2020   Chronic anemia 04/05/2020   Bowel and bladder incontinence 04/05/2020   AKI (acute kidney injury) (HCC) 02/26/2020   Hypotension 02/26/2020   Weakness    Midline cystocele 05/03/2019   OAB (overactive bladder) 05/03/2019   Urinary retention 05/03/2019   Uterine procidentia 05/03/2019   Chest pain, negative stress test. non cardiac. 06/16/2018   Primary osteoarthritis of left hip 01/20/2017   Primary osteoarthritis of right hip 11/10/2015   Anxiety state 11/10/2015   Preop cardiovascular exam 07/23/2014   Bilateral edema of lower extremity 07/18/2013    Class: Chronic   Metabolic syndrome 07/18/2013   Rectocele 07/07/2012   Essential hypertension 07/07/2012   GERD (gastroesophageal reflux disease) 07/07/2012   Hyperlipidemia with target LDL less than 100 07/07/2012   Rotator cuff tear, left 03/02/2012   Osteoarthritis of left shoulder 01/21/2012    Past Surgical History:  Procedure Laterality Date   CARDIAC CATHETERIZATION  04/20/1996   patent coronaries, EF 74% (Dr. Aram Candela)   CARPAL TUNNEL RELEASE Left    CATARACT EXTRACTION, BILATERAL     COLONOSCOPY     KNEE SURGERY Left 11/2001   NM MYOVIEW LTD  06/17/2018   EF 60-65 %.  Apical thinning.  LOW RISK no ischemia or infarction.   REPLACEMENT TOTAL KNEE Left 10/2009   REPLACEMENT TOTAL KNEE Left 10/2009   SHOULDER OPEN ROTATOR CUFF REPAIR  03/01/2012   Procedure: ROTATOR CUFF REPAIR SHOULDER OPEN;  Surgeon: Mable Paris, MD;  Location: [facility] OR;  Service: Orthopedics;  Laterality: Left;  SUBSCAPULARIS REPAIR   SVD     x 7   TOTAL HIP ARTHROPLASTY Right 07/24/2016   TOTAL HIP ARTHROPLASTY Right 07/24/2016   Procedure: RIGHT TOTAL HIP ARTHROPLASTY ANTERIOR APPROACH;  Surgeon: [name], MD;  Location: [facility] OR;  Service: Orthopedics;  Laterality: Right;    TOTAL HIP ARTHROPLASTY Left 04/09/2017   Procedure: LEFT TOTAL HIP ARTHROPLASTY ANTERIOR APPROACH;  Surgeon: [name], MD;  Location: [facility] ORS;  Service: Orthopedics;  Laterality: Left;   TOTAL SHOULDER ARTHROPLASTY  01/18/2012   Procedure: TOTAL SHOULDER ARTHROPLASTY;  Surgeon: Mable Paris, MD;  Location: [facility] OR;  Service: Orthopedics;  Laterality: Left;   TOTAL SHOULDER REPLACEMENT Left 01/19/2012   TRANSTHORACIC ECHOCARDIOGRAM  06/16/2018   EF 60 to 65%.  GR 1 DD.  Normal mitral valve.  Mild aortic sclerosis but no stenosis.   TUBAL LIGATION     WISDOM TOOTH EXTRACTION       OB History   No obstetric history on file.     Family History  Problem Relation Age of Onset   Heart disease Mother    Heart disease Father    Heart disease Sister    Hypertension Sister    Heart disease Sister    Hypertension Sister    Heart disease Sister    Hypertension Sister     Social History   Tobacco Use   Smoking status: Former    Packs/day: 1.00    Years: 25.00    Pack years: 25.00    Types: Cigarettes    Quit date: 03/07/1984    Years since quitting: 36.8   Smokeless tobacco: Former  Building services engineer Use: Never used  Substance Use Topics   Alcohol use: No   Drug use: No    Home Medications Prior to Admission medications   Medication Sig Start Date End Date Taking? Authorizing Provider  acetaminophen (TYLENOL) 500 MG tablet Take 500 mg by mouth every 6 (six) hours as needed for headache (painb).   Yes [provider]  aspirin 81 MG EC tablet Chew 81 mg by mouth daily.   Yes [provider]  hydrOXYzine (ATARAX/VISTARIL) 25 MG tablet Take 1 tablet (25 mg total) by mouth 3 (three) times daily as needed. Patient taking differently: Take 25 mg by mouth daily as needed for anxiety. 06/10/20  Yes Reed, Tiffany L, DO  midodrine (PROAMATINE) 10 MG tablet TAKE 1 TABLET BY MOUTH TWICE A DAY WITH MEALS Patient taking differently: Take 10 mg by mouth 2  (two) times daily with a meal. 01/06/21  Yes Fargo, Amy E, NP  mirtazapine (REMERON) 15 MG tablet Take 1 tablet (15 mg total) by mouth at bedtime. 11/14/20  Yes Fargo, Amy E, NP  nutrition supplement, JUVEN, (JUVEN) PACK Take 1 packet by mouth every other day. Alternate with pedialyte   Yes [provider]  Pedialyte (PEDIALYTE) SOLN Take 120 mLs by mouth every other day. Alternate with Juven   Yes [provider]  polyethylene glycol powder (GLYCOLAX/MIRALAX) 17 GM/SCOOP powder Mix 1 capful of powder in drink and take by mouth one to three times daily as needed for daily soft stools OTC Patient taking  differently: Take 17 g by mouth 2 (two) times daily with a meal. Mix 1 capful (17 g) of powder in 8 oz water and drink 04/19/20  Yes Pfeiffer, Lebron Conners, MD  sennosides-docusate sodium (SENOKOT-S) 8.6-50 MG tablet Take 2 tablets by mouth daily. 11/14/20  Yes Fargo, Amy E, NP  sodium phosphate (FLEET) 7-19 GM/118ML ENEM Place 1 enema rectally once as needed for severe constipation.   Yes [provider]  trimethoprim (TRIMPEX) 100 MG tablet Take 1 tablet (100 mg total) by mouth daily. 11/14/20  Yes Fargo, Amy E, NP  hydrocortisone (ANUSOL-HC) 2.5 % rectal cream Place 1 application rectally 2 (two) times daily. Patient not taking: No sig reported 12/31/20   Medina-Vargas, Monina C, NP    Allergies    Effexor [venlafaxine hcl], Latex, and Sulfa drugs cross reactors  Review of Systems   Review of Systems  Unable to perform ROS: Dementia   Physical Exam Updated Vital Signs BP (!) 145/95   Pulse 66   Temp (!) 97.4 F (36.3 C) (Oral)   Resp 20   Ht 5\' 2"  (1.575 m)   Wt 69 kg   SpO2 96%   BMI 27.82 kg/m   Physical Exam Vitals and nursing note reviewed.  Constitutional:      General: She is not in acute distress.    Appearance: She is not ill-appearing.     Comments: Resting comfortably, GCS 13 (4-4-5), follows simples commands with coaching  HENT:     Head: Normocephalic  and atraumatic.  Eyes:     Conjunctiva/sclera: Conjunctivae normal.     Pupils: Pupils are equal, round, and reactive to light.  Cardiovascular:     Rate and Rhythm: Normal rate and regular rhythm.     Pulses: Normal pulses.  Pulmonary:     Effort: Pulmonary effort is normal. No respiratory distress.  Abdominal:     General: There is no distension.     Tenderness: There is no guarding.  Musculoskeletal:        General: No deformity or signs of injury.     Cervical back: Normal range of motion and neck supple.     Comments: Right hip chronic wound with packing in place, no significant drainage or erythema noted.  Skin:    Findings: No lesion or rash.  Neurological:     General: No focal deficit present.     Mental Status: She is alert. She is disoriented.     Comments: Minimally verbal, GCS 13 (4-4-5). Will say short phrases and follow simple commands. Cranial nerves intact with no acute deficit. Moving all four extremities in response to painful stimuli, 3/5 strength in the bilateral lower extremities, exam appears to be effort dependent, 4/5 strength bilateral upper extremities    ED Results / Procedures / Treatments   Labs (all labs ordered are listed, but only abnormal results are displayed) Labs Reviewed  COMPREHENSIVE METABOLIC PANEL - Abnormal; Notable for the following components:      Result Value   BUN 25 (*)    Total Protein 8.5 (*)    All other components within normal limits  URINALYSIS, COMPLETE (UACMP) WITH MICROSCOPIC - Abnormal; Notable for the following components:   APPearance HAZY (*)    Hgb urine dipstick SMALL (*)    Ketones, ur 5 (*)    Nitrite POSITIVE (*)    Leukocytes,Ua LARGE (*)    WBC, UA >50 (*)    All other components within normal limits  BLOOD GAS, VENOUS -  Abnormal; Notable for the following components:   pO2, Ven 28.2 (*)    Acid-Base Excess 2.3 (*)    All other components within normal limits  T4, FREE - Abnormal; Notable for the  following components:   Free T4 1.24 (*)    All other components within normal limits  CBC - Abnormal; Notable for the following components:   RBC 3.81 (*)    Hemoglobin 11.4 (*)    All other components within normal limits  CULTURE, BLOOD (ROUTINE X 2)  CULTURE, BLOOD (ROUTINE X 2)  RESP PANEL BY RT-PCR (FLU A&B, COVID) ARPGX2  URINE CULTURE  CBC WITH DIFFERENTIAL/PLATELET  LACTIC ACID, PLASMA  PROTIME-INR  TSH  COMPREHENSIVE METABOLIC PANEL  AMMONIA  CBC  CREATININE, SERUM  CBG MONITORING, ED  TROPONIN I (HIGH SENSITIVITY)  TROPONIN I (HIGH SENSITIVITY)    EKG None  Radiology Abd 1 View (KUB)  Result Date: 01/12/2021 CLINICAL DATA:  Bowel problems. EXAM: ABDOMEN - 1 VIEW COMPARISON:  None. FINDINGS: The bowel gas pattern is normal. There is average stool burden. No radio-opaque calculi or other significant radiographic abnormality are seen. Bilateral hip arthroplasties are present. IMPRESSION: Negative. Electronically Signed   By: Darliss Cheney M.D.   On: 01/12/2021 17:52   CT HEAD WO CONTRAST  Result Date: 01/12/2021 CLINICAL DATA:  Altered mental status.  Dementia. EXAM: CT HEAD WITHOUT CONTRAST TECHNIQUE: Contiguous axial images were obtained from the base of the skull through the vertex without intravenous contrast. COMPARISON:  06/17/2020 FINDINGS: Brain: Ventricles, cisterns and other CSF spaces are within normal. There is minimal chronic ischemic microvascular disease. No mass, mass effect, shift of midline structures or acute hemorrhage. No evidence of acute infarction. Vascular: No hyperdense vessel or unexpected calcification. Skull: Normal. Negative for fracture or focal lesion. Sinuses/Orbits: No acute finding. Other: None. IMPRESSION: 1. No acute findings. 2. Minimal chronic ischemic microvascular disease. Electronically Signed   By: Elberta Fortis M.D.   On: 01/12/2021 14:01   DG Chest Port 1 View  Result Date: 01/12/2021 CLINICAL DATA:  Altered mental status.   Wound of the RIGHT hip. EXAM: PORTABLE CHEST 1 VIEW COMPARISON:  02/26/2020 FINDINGS: Heart is enlarged, probably stable in comparison with prior study. There are no focal consolidations or pleural effusions. No pulmonary edema. No pneumothorax or acute displaced fracture. Remote LEFT shoulder arthroplasty. Chronic changes in the RIGHT shoulder. IMPRESSION: Stable cardiomegaly.  No evidence for acute  abnormality. Electronically Signed   By: Norva Pavlov M.D.   On: 01/12/2021 14:30   DG Hip Unilat W or Wo Pelvis 2-3 Views Right  Result Date: 01/12/2021 CLINICAL DATA:  Hip ulcer/wound. Wound of the RIGHT hip. History of dementia. EXAM: DG HIP (WITH OR WITHOUT PELVIS) 2-3V RIGHT COMPARISON:  09/30/2020 FINDINGS: Status post bilateral total hip arthroplasty. Hardware appears well seated. There is no acute fracture or subluxation. There is irregularity of the soft tissue adjacent to the RIGHT greater trochanter, consistent with described ulcer/wound. Apart from this, no soft tissue gas. Visualized bowel gas pattern is nonobstructive. IMPRESSION: No acute fracture. Soft tissue irregularity adjacent to the RIGHT hip. Electronically Signed   By: Norva Pavlov M.D.   On: 01/12/2021 14:29    Procedures Procedures   Medications Ordered in ED Medications  mirtazapine (REMERON) tablet 15 mg (15 mg Oral Given 01/12/21 2242)  hydrOXYzine (ATARAX/VISTARIL) tablet 25 mg (25 mg Oral Given 01/13/21 0740)  midodrine (PROAMATINE) tablet 10 mg (10 mg Oral Given 01/13/21 0737)  polyethylene glycol (MIRALAX /  GLYCOLAX) packet 17 g (17 g Oral Given 01/13/21 0741)  senna-docusate (Senokot-S) tablet 2 tablet (2 tablets Oral Given 01/13/21 0941)  aspirin EC tablet 81 mg (81 mg Oral Given 01/13/21 0940)  enoxaparin (LOVENOX) injection 40 mg (40 mg Subcutaneous Given 01/12/21 2242)  ondansetron (ZOFRAN) tablet 4 mg (has no administration in time range)    Or  ondansetron (ZOFRAN) injection 4 mg (has no administration in time  range)  bisacodyl (DULCOLAX) suppository 10 mg (has no administration in time range)  cefTRIAXone (ROCEPHIN) 1 g in sodium chloride 0.9 % 100 mL IVPB (1 g Intravenous New Bag/Given 01/13/21 1515)  cefTRIAXone (ROCEPHIN) 1 g in sodium chloride 0.9 % 100 mL IVPB (0 g Intravenous Stopped 01/12/21 1554)  lactated ringers bolus 1,000 mL (0 mLs Intravenous Stopped 01/12/21 1645)  0.9 %  sodium chloride infusion (0 mLs Intravenous Stopped 01/12/21 2238)    ED Course  I have reviewed the triage vital signs and the nursing notes.  Pertinent labs & imaging results that were available during my care of the patient were reviewed by me and considered in my medical decision making (see chart for details).    MDM Rules/Calculators/A&P                           80 year old female with past medical history below to include dementia, depression, HLD, HTN presenting to the emergency department with altered mental status. The patient presents from home where she lives with family.  Family members noted this morning that the patient is not at her baseline mental status.  Family member at bedside (patient's son) could not further characterize the patient's baseline.  He states that normally she will attempt conversation and will be pleasantly confused.  Today, she has been not as verbal, with few words stated, no clear slurred speech or facial droop, appears globally weak with no focal deficits per family.  She was nonambulatory today.  She does have a history of a chronic wound along her right hip which was present on x-ray imaging back in June.  She sees wound care for this wound.  No fevers or chills.  No cough, shortness of breath.   Arrival, the patient was hypothermic temperature 97.5, Not tachycardic or tachypneic, mildly hypertensive BP 142/86, saturating 98% on room air.  DDx for the patient's altered mental status includes electrolyte abnormality, infectious etiology such as urinary tract infection or  pneumonia, other toxic metabolic derangement, less likely CVA.  Work-up initiated to include an EKG without ischemic changes, chest x-ray without acute cardiac or pulmonary abnormality, COVID-19 and influenza PCR testing normal, TSH normal, lactic acid normal, CBC without a leukocytosis anemia or platelet abnormality,  CT head without acute intracranial abnormality, x-ray of the pelvis and right hip with soft tissue irregularity of the right hip, no x-ray evidence of osteomyelitis, chest x-ray with acute cardiac or pulmonary abnormality.  Stable cardiomegaly noted.  Urinalysis positive for urinary tract infection.  Suspect encephalopathy as the etiology of the patient's dementia in the setting of urinary tract infection.  The patient was started on Rocephin IV and hospitalist medicine was consulted for admission.   Final Clinical Impression(s) / ED Diagnoses Final diagnoses:  Encephalopathy acute  Acute cystitis with hematuria  Constipation    Rx / DC Orders ED Discharge Orders     None        Ernie Avena, MD 01/13/21 1521

## 2021-01-12 NOTE — ED Triage Notes (Signed)
Per EMS, patient from home where she lives with family. Hx dementia. Family reports decreased LOC from baseline today. Ambulatory with assistance at baseline. Non ambulatory today. Sees wound care specialist for wound to R thigh.  BP 131/83

## 2021-01-12 NOTE — ED Notes (Signed)
Patient daughter is leaving. Verified number is correct.

## 2021-01-12 NOTE — H&P (Signed)
History and Physical    [name] QVZ:[id number] DOB: 10-Jul-1940 DOA: 01/12/2021  PCP: [name], NP  Patient coming from: Home  Chief Complaint: altered mental status  HPI: [name] is a 80 y.o. female with medical history significant of chronic constipation, prolapsed bladder, dementia. Presenting with altered mental status. History is from daughter at bedside. She reports that for the last 2 days the patient has not been acting like herself. Family was concerned 2 days ago that she was having BM problems. They gave her a laxative. Yesterday morning she complained of some non-specific stomach pain. She seemed weak all day and had a lack of appetite. Family gave her APAP. This morning, she seemed more confused. Today family was having more difficulty getting her moved around. She wasn't assisting like she normally would. Family became concerned and called 911.   ED Course: CTH was negative. CXR was negative. UA was concerning for UTI. She was started on rocephin. TRH was called for admission.   Review of Systems:  Unable to obtain d/t mentation.   PMHx Past Medical History:  Diagnosis Date   Anemia    on iron   Anxiety    Arthritis    shoulders, knees, hands, hips; status post left knee surgery   Chronic bronchitis (HCC)    CXR 07/08/16   Dementia (HCC)    Depression    GERD (gastroesophageal reflux disease)    TAKES TUMS   H/O: pneumonia 2003   Hyperlipidemia    on med   Hypertension    Morbid obesity with BMI of 45.0-49.9, adult (HCC)    Shingles    recent 02/2017   Thoracic aortic atherosclerosis (HCC)    CXR 07/08/16    PSHx Past Surgical History:  Procedure Laterality Date   CARDIAC CATHETERIZATION  04/20/1996   patent coronaries, EF 74% (Dr. Aram Candela)   CARPAL TUNNEL RELEASE Left    CATARACT EXTRACTION, BILATERAL     COLONOSCOPY     KNEE SURGERY Left 11/2001   NM MYOVIEW LTD  06/17/2018   EF 60-65 %.  Apical thinning.  LOW RISK no ischemia or  infarction.   REPLACEMENT TOTAL KNEE Left 10/2009   REPLACEMENT TOTAL KNEE Left 10/2009   SHOULDER OPEN ROTATOR CUFF REPAIR  03/01/2012   Procedure: ROTATOR CUFF REPAIR SHOULDER OPEN;  Surgeon: Mable Paris, MD;  Location: [facility];  Service: Orthopedics;  Laterality: Left;  SUBSCAPULARIS REPAIR   SVD     x 7   TOTAL HIP ARTHROPLASTY Right 07/24/2016   TOTAL HIP ARTHROPLASTY Right 07/24/2016   Procedure: RIGHT TOTAL HIP ARTHROPLASTY ANTERIOR APPROACH;  Surgeon: [name], MD;  Location: [facility] OR;  Service: Orthopedics;  Laterality: Right;   TOTAL HIP ARTHROPLASTY Left 04/09/2017   Procedure: LEFT TOTAL HIP ARTHROPLASTY ANTERIOR APPROACH;  Surgeon: [name], MD;  Location: [facility] ORS;  Service: Orthopedics;  Laterality: Left;   TOTAL SHOULDER ARTHROPLASTY  01/18/2012   Procedure: TOTAL SHOULDER ARTHROPLASTY;  Surgeon: Mable Paris, MD;  Location: [facility] OR;  Service: Orthopedics;  Laterality: Left;   TOTAL SHOULDER REPLACEMENT Left 01/19/2012   TRANSTHORACIC ECHOCARDIOGRAM  06/16/2018   EF 60 to 65%.  GR 1 DD.  Normal mitral valve.  Mild aortic sclerosis but no stenosis.   TUBAL LIGATION     WISDOM TOOTH EXTRACTION      SocHx  reports that she quit smoking about 36 years ago. Her smoking use included cigarettes. She has a 25.00 pack-year smoking  history. She has quit using smokeless tobacco. She reports that she does not drink alcohol and does not use drugs.  Allergies  Allergen Reactions   Effexor [Venlafaxine Hcl] Swelling and Other (See Comments)    MD ADVISES PATIENT TO NOT TAKE IN FUTURE PT STOPPED, MD DC'd MED   Latex Rash   Sulfa Drugs Cross Reactors Itching    FamHx Family History  Problem Relation Age of Onset   Heart disease Mother    Heart disease Father    Heart disease Sister    Hypertension Sister    Heart disease Sister    Hypertension Sister    Heart disease Sister    Hypertension Sister     Prior to Admission medications   Medication Sig Start  Date End Date Taking? Authorizing Provider  acetaminophen (TYLENOL) 500 MG tablet Take 500 mg by mouth every 6 (six) hours as needed for headache (painb).   Yes [provider]  aspirin 81 MG EC tablet Chew 81 mg by mouth daily.   Yes [provider]  hydrOXYzine (ATARAX/VISTARIL) 25 MG tablet Take 1 tablet (25 mg total) by mouth 3 (three) times daily as needed. Patient taking differently: Take 25 mg by mouth daily as needed for anxiety. 06/10/20  Yes Reed, Tiffany L, DO  midodrine (PROAMATINE) 10 MG tablet TAKE 1 TABLET BY MOUTH TWICE A DAY WITH MEALS Patient taking differently: Take 10 mg by mouth 2 (two) times daily with a meal. 01/06/21  Yes Fargo, Amy E, NP  mirtazapine (REMERON) 15 MG tablet Take 1 tablet (15 mg total) by mouth at bedtime. 11/14/20  Yes Fargo, Amy E, NP  nutrition supplement, JUVEN, (JUVEN) PACK Take 1 packet by mouth every other day. Alternate with pedialyte   Yes [provider]  Pedialyte (PEDIALYTE) SOLN Take 120 mLs by mouth every other day. Alternate with Juven   Yes [provider]  polyethylene glycol powder (GLYCOLAX/MIRALAX) 17 GM/SCOOP powder Mix 1 capful of powder in drink and take by mouth one to three times daily as needed for daily soft stools OTC Patient taking differently: Take 17 g by mouth 2 (two) times daily with a meal. Mix 1 capful (17 g) of powder in 8 oz water and drink 04/19/20  Yes Pfeiffer, Lebron Conners, MD  sennosides-docusate sodium (SENOKOT-S) 8.6-50 MG tablet Take 2 tablets by mouth daily. 11/14/20  Yes Fargo, Amy E, NP  trimethoprim (TRIMPEX) 100 MG tablet Take 1 tablet (100 mg total) by mouth daily. 11/14/20  Yes Fargo, Amy E, NP  hydrocortisone (ANUSOL-HC) 2.5 % rectal cream Place 1 application rectally 2 (two) times daily. Patient not taking: No sig reported 12/31/20   Medina-Vargas, Avanell Shackleton C, NP    Physical Exam: Vitals:   01/12/21 1415 01/12/21 1430 01/12/21 1445 01/12/21 1500  BP: (!) 156/95 (!) 144/92 (!) 174/99 (!)  137/100  Pulse: 70 71 80 82  Resp: 14 19 13 18   Temp:      TempSrc:      SpO2: 98% 97% 97% 96%  Weight:      Height:        General: 80 y.o. female resting in bed in NAD Eyes: PERRL, normal sclera ENMT: Nares patent w/o discharge, orophaynx clear, dentition normal, ears w/o discharge/lesions/ulcers Neck: Supple, trachea midline Cardiovascular: RRR, +S1, S2, no m/g/r, equal pulses throughout Respiratory: CTABL, no w/r/r, normal WOB GI: BS hypoactive, NDNT, no masses noted, no organomegaly noted MSK: No e/c/c Skin: No rashes, bruises, ulcerations noted Neuro: A&O x  name only, no focal deficits Psyc: Flat affect, calm/cooperative  Labs on Admission: I have personally reviewed following labs and imaging studies  CBC: Recent Labs  Lab 01/12/21 1305  WBC 5.0  NEUTROABS 2.9  HGB 12.4  HCT 39.7  MCV 96.6  PLT 273   Basic Metabolic Panel: Recent Labs  Lab 01/12/21 1305  NA 138  K 4.0  CL 104  CO2 25  GLUCOSE 89  BUN 25*  CREATININE 0.88  CALCIUM 9.8   GFR: Estimated Creatinine Clearance: 48 mL/min (by C-G formula based on SCr of 0.88 mg/dL). Liver Function Tests: Recent Labs  Lab 01/12/21 1305  AST 27  ALT 14  ALKPHOS 70  BILITOT 0.8  PROT 8.5*  ALBUMIN 4.5   No results for input(s): LIPASE, AMYLASE in the last 168 hours. No results for input(s): AMMONIA in the last 168 hours. Coagulation Profile: Recent Labs  Lab 01/12/21 1305  INR 1.1   Cardiac Enzymes: No results for input(s): CKTOTAL, CKMB, CKMBINDEX, TROPONINI in the last 168 hours. BNP (last 3 results) No results for input(s): PROBNP in the last 8760 hours. HbA1C: No results for input(s): HGBA1C in the last 72 hours. CBG: Recent Labs  Lab 01/12/21 1318  GLUCAP 72   Lipid Profile: No results for input(s): CHOL, HDL, LDLCALC, TRIG, CHOLHDL, LDLDIRECT in the last 72 hours. Thyroid Function Tests: Recent Labs    01/12/21 1305  TSH 0.771   Anemia Panel: No results for input(s):  VITAMINB12, FOLATE, FERRITIN, TIBC, IRON, RETICCTPCT in the last 72 hours. Urine analysis:    Component Value Date/Time   COLORURINE YELLOW 01/12/2021 1445   APPEARANCEUR HAZY (A) 01/12/2021 1445   LABSPEC 1.015 01/12/2021 1445   PHURINE 6.0 01/12/2021 1445   GLUCOSEU NEGATIVE 01/12/2021 1445   HGBUR SMALL (A) 01/12/2021 1445   BILIRUBINUR NEGATIVE 01/12/2021 1445   BILIRUBINUR negative 08/11/2017 1420   BILIRUBINUR neg 01/11/2014 0939   KETONESUR 5 (A) 01/12/2021 1445   PROTEINUR NEGATIVE 01/12/2021 1445   UROBILINOGEN 0.2 08/11/2017 1420   UROBILINOGEN 0.2 01/11/2012 1027   NITRITE POSITIVE (A) 01/12/2021 1445   LEUKOCYTESUR LARGE (A) 01/12/2021 1445    Radiological Exams on Admission: CT HEAD WO CONTRAST  Result Date: 01/12/2021 CLINICAL DATA:  Altered mental status.  Dementia. EXAM: CT HEAD WITHOUT CONTRAST TECHNIQUE: Contiguous axial images were obtained from the base of the skull through the vertex without intravenous contrast. COMPARISON:  06/17/2020 FINDINGS: Brain: Ventricles, cisterns and other CSF spaces are within normal. There is minimal chronic ischemic microvascular disease. No mass, mass effect, shift of midline structures or acute hemorrhage. No evidence of acute infarction. Vascular: No hyperdense vessel or unexpected calcification. Skull: Normal. Negative for fracture or focal lesion. Sinuses/Orbits: No acute finding. Other: None. IMPRESSION: 1. No acute findings. 2. Minimal chronic ischemic microvascular disease. Electronically Signed   By: Elberta Fortis M.D.   On: 01/12/2021 14:01   DG Chest Port 1 View  Result Date: 01/12/2021 CLINICAL DATA:  Altered mental status.  Wound of the RIGHT hip. EXAM: PORTABLE CHEST 1 VIEW COMPARISON:  02/26/2020 FINDINGS: Heart is enlarged, probably stable in comparison with prior study. There are no focal consolidations or pleural effusions. No pulmonary edema. No pneumothorax or acute displaced fracture. Remote LEFT shoulder  arthroplasty. Chronic changes in the RIGHT shoulder. IMPRESSION: Stable cardiomegaly.  No evidence for acute  abnormality. Electronically Signed   By: Norva Pavlov M.D.   On: 01/12/2021 14:30   DG Hip Unilat W or Wo Pelvis  2-3 Views Right  Result Date: 01/12/2021 CLINICAL DATA:  Hip ulcer/wound. Wound of the RIGHT hip. History of dementia. EXAM: DG HIP (WITH OR WITHOUT PELVIS) 2-3V RIGHT COMPARISON:  09/30/2020 FINDINGS: Status post bilateral total hip arthroplasty. Hardware appears well seated. There is no acute fracture or subluxation. There is irregularity of the soft tissue adjacent to the RIGHT greater trochanter, consistent with described ulcer/wound. Apart from this, no soft tissue gas. Visualized bowel gas pattern is nonobstructive. IMPRESSION: No acute fracture. Soft tissue irregularity adjacent to the RIGHT hip. Electronically Signed   By: Norva Pavlov M.D.   On: 01/12/2021 14:29    EKG: None obtained in ED  Assessment/Plan Acute on chronic metabolic encephalopathy UTI     - place in med-surg obs     - UA concerning for UTI; continue abx and follow Ucx     - fluids  Chronic constipation     - check KUB     - resume home regimen  Right hip wound     - follows w/ wound care     - WOCN  Dementia     - continue home regimen  DVT prophylaxis: lovenox  Code Status: FULL  Family Communication: w/ dtr at bedside  Consults called: None   Status is: Observation  The patient remains OBS appropriate and will d/c before 2 midnights.  Dispo: The patient is from: Home              Anticipated d/c is to: Home              Patient currently is not medically stable to d/c.   Difficult to place patient No  Time spent coordinating admission: 35 minutes  Uvaldo Rybacki A Jimmi Sidener DO [facility]  If 7PM-7AM, please contact night-coverage [email or web address]  01/12/2021, 3:32 PM

## 2021-01-12 NOTE — ED Notes (Signed)
Pt ate 75% meal 

## 2021-01-13 ENCOUNTER — Encounter (HOSPITAL_COMMUNITY): Payer: Self-pay | Admitting: Internal Medicine

## 2021-01-13 ENCOUNTER — Encounter (HOSPITAL_BASED_OUTPATIENT_CLINIC_OR_DEPARTMENT_OTHER): Payer: MEDICARE | Admitting: Internal Medicine

## 2021-01-13 DIAGNOSIS — Z96612 Presence of left artificial shoulder joint: Secondary | ICD-10-CM | POA: Diagnosis present

## 2021-01-13 DIAGNOSIS — R627 Adult failure to thrive: Secondary | ICD-10-CM | POA: Diagnosis present

## 2021-01-13 DIAGNOSIS — F32A Depression, unspecified: Secondary | ICD-10-CM | POA: Diagnosis present

## 2021-01-13 DIAGNOSIS — Z20822 Contact with and (suspected) exposure to covid-19: Secondary | ICD-10-CM | POA: Diagnosis present

## 2021-01-13 DIAGNOSIS — Z9842 Cataract extraction status, left eye: Secondary | ICD-10-CM | POA: Diagnosis not present

## 2021-01-13 DIAGNOSIS — Z87891 Personal history of nicotine dependence: Secondary | ICD-10-CM | POA: Diagnosis not present

## 2021-01-13 DIAGNOSIS — G9340 Encephalopathy, unspecified: Secondary | ICD-10-CM | POA: Diagnosis present

## 2021-01-13 DIAGNOSIS — I10 Essential (primary) hypertension: Secondary | ICD-10-CM | POA: Diagnosis present

## 2021-01-13 DIAGNOSIS — K5900 Constipation, unspecified: Secondary | ICD-10-CM

## 2021-01-13 DIAGNOSIS — I9589 Other hypotension: Secondary | ICD-10-CM | POA: Diagnosis present

## 2021-01-13 DIAGNOSIS — Z96652 Presence of left artificial knee joint: Secondary | ICD-10-CM | POA: Diagnosis present

## 2021-01-13 DIAGNOSIS — G301 Alzheimer's disease with late onset: Secondary | ICD-10-CM | POA: Diagnosis present

## 2021-01-13 DIAGNOSIS — Z6826 Body mass index (BMI) 26.0-26.9, adult: Secondary | ICD-10-CM | POA: Diagnosis not present

## 2021-01-13 DIAGNOSIS — F0280 Dementia in other diseases classified elsewhere without behavioral disturbance: Secondary | ICD-10-CM | POA: Diagnosis present

## 2021-01-13 DIAGNOSIS — Z96643 Presence of artificial hip joint, bilateral: Secondary | ICD-10-CM | POA: Diagnosis present

## 2021-01-13 DIAGNOSIS — R630 Anorexia: Secondary | ICD-10-CM | POA: Diagnosis present

## 2021-01-13 DIAGNOSIS — K5909 Other constipation: Secondary | ICD-10-CM | POA: Diagnosis present

## 2021-01-13 DIAGNOSIS — Z882 Allergy status to sulfonamides status: Secondary | ICD-10-CM | POA: Diagnosis not present

## 2021-01-13 DIAGNOSIS — G9341 Metabolic encephalopathy: Secondary | ICD-10-CM | POA: Diagnosis present

## 2021-01-13 DIAGNOSIS — N390 Urinary tract infection, site not specified: Secondary | ICD-10-CM | POA: Diagnosis present

## 2021-01-13 DIAGNOSIS — F419 Anxiety disorder, unspecified: Secondary | ICD-10-CM | POA: Diagnosis present

## 2021-01-13 DIAGNOSIS — E785 Hyperlipidemia, unspecified: Secondary | ICD-10-CM | POA: Diagnosis present

## 2021-01-13 DIAGNOSIS — N8110 Cystocele, unspecified: Secondary | ICD-10-CM | POA: Diagnosis present

## 2021-01-13 DIAGNOSIS — N3001 Acute cystitis with hematuria: Secondary | ICD-10-CM | POA: Diagnosis not present

## 2021-01-13 DIAGNOSIS — L89219 Pressure ulcer of right hip, unspecified stage: Secondary | ICD-10-CM | POA: Diagnosis present

## 2021-01-13 DIAGNOSIS — Z9841 Cataract extraction status, right eye: Secondary | ICD-10-CM | POA: Diagnosis not present

## 2021-01-13 LAB — URINE CULTURE

## 2021-01-13 LAB — COMPREHENSIVE METABOLIC PANEL
ALT: 17 U/L (ref 0–44)
AST: 31 U/L (ref 15–41)
Albumin: 3.8 g/dL (ref 3.5–5.0)
Alkaline Phosphatase: 59 U/L (ref 38–126)
Anion gap: 6 (ref 5–15)
BUN: 22 mg/dL (ref 8–23)
CO2: 27 mmol/L (ref 22–32)
Calcium: 9.4 mg/dL (ref 8.9–10.3)
Chloride: 105 mmol/L (ref 98–111)
Creatinine, Ser: 0.85 mg/dL (ref 0.44–1.00)
GFR, Estimated: >60 mL/min (ref >60)
Glucose, Bld: 87 mg/dL (ref 70–99)
Potassium: 4.1 mmol/L (ref 3.5–5.1)
Sodium: 138 mmol/L (ref 135–145)
Total Bilirubin: 1 mg/dL (ref 0.3–1.2)
Total Protein: 7.5 g/dL (ref 6.5–8.1)

## 2021-01-13 LAB — CBC
HCT: 36.4 % (ref 36.0–46.0)
Hemoglobin: 11.4 g/dL — ABNORMAL LOW (ref 12.0–15.0)
MCH: 29.9 pg (ref 26.0–34.0)
MCHC: 31.3 g/dL (ref 30.0–36.0)
MCV: 95.5 fL (ref 80.0–100.0)
Platelets: 252 10*3/uL (ref 150–400)
RBC: 3.81 MIL/uL — ABNORMAL LOW (ref 3.87–5.11)
RDW: 14.6 % (ref 11.5–15.5)
WBC: 4.9 10*3/uL (ref 4.0–10.5)
nRBC: 0 % (ref 0.0–0.2)

## 2021-01-13 NOTE — Evaluation (Signed)
Physical Therapy Evaluation Patient Details Name: [name] MRN: [id number] DOB: October 24, 1940 Today's Date: 01/13/2021  History of Present Illness  Patient is a 80 year old female with history of chronic constipation, prolapsed bladder, dementia presented with altered mental status,, patient seemed to be more confused and having more difficulty ambulating and hence was brought to ED 01/12/21 for further work-up for UTI. Chronic right hip wound.  Clinical Impression  The patient  does not arouse to stimulation, Assisted with rolling for bed change. Sat patient in upright bed position to arouse bur remained with eyes closed.  Daughter came into room and provided History: at baseline, patient ambulates with assistance with RW,  attends [facility] 5/wk, 7-8 hours /day.   Patient may benefit from short rehab stay prior to DC home Pt admitted with above diagnosis.  Pt currently with functional limitations due to the deficits listed below (see PT Problem List). Pt will benefit from skilled PT to increase their independence and safety with mobility to allow discharge to the venue listed below.    Patient did moan when rolled onto right hip wound. -      Recommendations for follow up therapy are one component of a multi-disciplinary discharge planning process, led by the attending physician.  Recommendations may be updated based on patient status, additional functional criteria and insurance authorization.  Follow Up Recommendations SNF    Equipment Recommendations  None recommended by PT    Recommendations for Other Services       Precautions / Restrictions Precautions Precautions: Fall      Mobility  Bed Mobility Overal bed mobility: Needs Assistance Bed Mobility: Rolling Rolling: Total assist;+2 for physical assistance;+2 for safety/equipment         General bed mobility comments: Total assistance  for rolling and repositioning in bed.    Transfers                  General transfer comment: TBA, needs a lift curently  Ambulation/Gait                Stairs            Wheelchair Mobility    Modified Rankin (Stroke Patients Only)       Balance                                             Pertinent Vitals/Pain Pain Assessment: Faces Faces Pain Scale: Hurts little more Pain Location: when rolling and moving extremities. Pain Descriptors / Indicators: Moaning Pain Intervention(s): Limited activity within patient's tolerance;Monitored during session    Home Living Family/patient expects to be discharged to:: Private residence Living Arrangements: Children Available Help at Discharge: Family;Available PRN/intermittently Type of Home: House Home Access: Stairs to enter   Entrance Stairs-Number of Steps: 1 Home Layout: One level Home Equipment: Cane - single point;Walker - 4 wheels;Wheelchair - manual      Prior Function           Comments: daughter/caregiver present, reports patient ambulated with 1 assist and RW, Goes to [facility] 5 DAYS A WEEK  at [employer].     Hand Dominance   Dominant Hand: Right    Extremity/Trunk Assessment   Upper Extremity Assessment Upper Extremity Assessment: Defer to OT evaluation (rigid UE's . holding against trunk.)    Lower Extremity Assessment Lower Extremity Assessment: Difficult  to assess due to impaired cognition (does not help moving legs, more rigid in legs)    Cervical / Trunk Assessment Cervical / Trunk Assessment: Other exceptions Cervical / Trunk Exceptions: unable to assess  Communication   Communication: Expressive difficulties;Receptive difficulties (patient mute, did not speak)  Cognition Arousal/Alertness: Awake/alert Behavior During Therapy: WFL for tasks assessed/performed Overall Cognitive Status: Within Functional Limits for tasks assessed                                        General Comments General  comments (skin integrity, edema, etc.): unable to test sitting, too rigid , no aroused    Exercises     Assessment/Plan    PT Assessment Patient needs continued PT services  PT Problem List Decreased strength;Decreased mobility;Impaired tone;Decreased range of motion;Decreased knowledge of precautions;Decreased activity tolerance;Decreased cognition;Cardiopulmonary status limiting activity;Decreased balance;Decreased knowledge of use of DME       PT Treatment Interventions DME instruction;Therapeutic activities;Cognitive remediation;Gait training;Therapeutic exercise;Patient/family education;Functional mobility training;Balance training    PT Goals (Current goals can be found in the Care Plan section)  Acute Rehab PT Goals Patient Stated Goal: to be able to walk and go to day care ctr PT Goal Formulation: With family Time For Goal Achievement: 01/27/21 Potential to Achieve Goals: Fair    Frequency Min 2X/week   Barriers to discharge        Co-evaluation               AM-PAC PT "6 Clicks" Mobility  Outcome Measure Help needed turning from your back to your side while in a flat bed without using bedrails?: Total Help needed moving from lying on your back to sitting on the side of a flat bed without using bedrails?: Total Help needed moving to and from a bed to a chair (including a wheelchair)?: Total Help needed standing up from a chair using your arms (e.g., wheelchair or bedside chair)?: Total Help needed to walk in hospital room?: Total Help needed climbing 3-5 steps with a railing? : Total 6 Click Score: 6    End of Session   Activity Tolerance: Patient limited by lethargy Patient left: in bed;with call bell/phone within reach;with family/visitor present;with nursing/sitter in room Nurse Communication: Mobility status PT Visit Diagnosis: Unsteadiness on feet (R26.81);Difficulty in walking, not elsewhere classified (R26.2)    Time: 8032-1224 PT Time Calculation  (min) (ACUTE ONLY): 28 min   Charges:   PT Evaluation $PT Eval Moderate Complexity: 1 Mod PT Treatments $Therapeutic Activity: 8-22 mins        [name] PT Acute Rehabilitation Services Pager [phone] Office [phone]   Rada Hay 01/13/2021, 1:20 PM

## 2021-01-13 NOTE — Consult Note (Signed)
WOC re-consulted for wound care to the right hip. Please see note.  Patient is followed by the wound care center, they would direct the plan of care. Would not recommend change in plan of care at this time.  Bedside nurses can change dressing as ordered.  Will make sure patient has regular hospital bed in place rather than stretcher since extended time in the ED.   Haileyann Staiger [facility], CNS, [employer] [phone]

## 2021-01-13 NOTE — Progress Notes (Signed)
[facility]                                                                              Patient Demographics  [name], is a 80 y.o. female, DOB - 03-06-1941, SNK:[id number]  Admit date - 01/12/2021   Admitting Physician Teddy Spike, DO  Outpatient Primary MD for the patient is [name], NP  Outpatient specialists:   LOS - 0  days   Medical records reviewed and are as summarized below:    Chief Complaint  Patient presents with   Urinary Tract Infection       Brief summary   Patient is a 80 year old female with history of chronic constipation, prolapsed bladder, dementia presented with altered mental status.  History from the daughter at the bedside reported that for the last 2 days prior to admission, patient had not been acting herself.  She was having constipation and was receiving laxative.  A day before the admission, she complained of nonspecific stomach pain, felt weak all day and had lack of appetite.  On the morning of admission, patient seemed to be more confused and having more difficulty ambulating and hence was brought to ED for further work-up UA positive for UTI.  Assessment & Plan    Principal Problem:   Acute on chronic metabolic encephalopathy, in the setting of underlying dementia -UA concerning for UTI, follow urine culture and sensitivities -Continue IV Rocephin, gentle hydration -Continue Remeron -PT eval  Active Problems:    Hypotension -BP currently stable, is on midodrine for chronic hypotension     Failure to thrive in adult -Obtain PT OT evaluation, treating UTI  Constipation Continue MiraLAX, Senokot  Right hip wound, chronic -Follows wound care, consult placed  Code Status: full DVT Prophylaxis:  enoxaparin (LOVENOX) injection 40 mg Start: 01/12/21 2200   Level of Care: Level of care: Med-Surg Family Communication: Discussed all imaging results, lab results, explained to the patient    Disposition  Plan:     Status is: Observation  The patient remains OBS appropriate and will d/c before 2 midnights.  Dispo: The patient is from: Home              Anticipated d/c is to:  TBD              Patient currently is not medically stable to d/c.   Difficult to place patient No      Time Spent in minutes    Procedures:  none  Consultants:   none  Antimicrobials:   Anti-infectives (From admission, onward)    Start     Dose/Rate Route Frequency Ordered Stop   01/13/21 1600  cefTRIAXone (ROCEPHIN) 1 g in sodium chloride 0.9 % 100 mL IVPB        1 g 200 mL/hr over 30 Minutes Intravenous Every 24 hours 01/12/21 1904     01/13/21 1000  cefTRIAXone (ROCEPHIN) 1 g in sodium chloride 0.9 % 100 mL IVPB  Status:  Discontinued        1 g 200 mL/hr over 30 Minutes Intravenous Every 24 hours 01/12/21 1649 01/12/21 1904  01/12/21 1530  cefTRIAXone (ROCEPHIN) 1 g in sodium chloride 0.9 % 100 mL IVPB        1 g 200 mL/hr over 30 Minutes Intravenous  Once 01/12/21 1517 01/12/21 1554          Medications  Scheduled Meds:  aspirin EC  81 mg Oral Daily   enoxaparin (LOVENOX) injection  40 mg Subcutaneous Q24H   midodrine  10 mg Oral BID WC   mirtazapine  15 mg Oral QHS   polyethylene glycol  17 g Oral BID WC   senna-docusate  2 tablet Oral Daily   Continuous Infusions:  cefTRIAXone (ROCEPHIN)  IV     PRN Meds:.bisacodyl, hydrOXYzine, ondansetron **OR** ondansetron (ZOFRAN) IV      Subjective:   [name] was seen and examined today.  Appears to be somewhat confused, no acute chest pain, shortness of breath, nausea vomiting or diarrhea.  No fevers  Objective:   Vitals:   01/13/21 0736 01/13/21 0800 01/13/21 0900 01/13/21 1100  BP:  (!) 150/99 (!) 166/94 (!) 135/91  Pulse:  77 72 63  Resp:  20 20 (!) 24  Temp: (!) 97.4 F (36.3 C)     TempSrc: Oral     SpO2:  98% 98% 99%  Weight:      Height:        Intake/Output Summary (Last 24 hours) at 01/13/2021  1202 Last data filed at 01/12/2021 1645 Gross per 24 hour  Intake 1100 ml  Output --  Net 1100 ml     Wt Readings from Last 3 Encounters:  01/12/21 69 kg  11/14/20 68.5 kg  10/28/20 65.1 kg     Exam General: Alert and oriented x self, NAD Cardiovascular: S1 S2 auscultated, RRR Respiratory: Clear to auscultation bilaterally, no wheezing Gastrointestinal: Soft, nontender, nondistended, + bowel sounds Ext: no pedal edema bilaterally Neuro: strength 5/5 in upper extremities, gait not assessed, appears to have lower extremity weakness, poor effort Psych: confused   Data Reviewed:  I have personally reviewed following labs and imaging studies  Micro Results Recent Results (from the past 240 hour(s))  Blood Cultures (routine x 2)     Status: None (Preliminary result)   Collection Time: 01/12/21  1:05 PM   Specimen: BLOOD  Result Value Ref Range Status   Specimen Description   Final    BLOOD LEFT ANTECUBITAL Performed at [facility], [address], [place], [place] [address]    Special Requests   Final    BOTTLES DRAWN AEROBIC AND ANAEROBIC Blood Culture adequate volume Performed at [facility], [address], [place], [place] [address]    Culture   Final    NO GROWTH < 24 HOURS Performed at [facility], [address], [place], [place] [address]    Report Status PENDING  Incomplete  Resp Panel by RT-PCR (Flu A&B, Covid) Nasopharyngeal Swab     Status: None   Collection Time: 01/12/21  1:05 PM   Specimen: Nasopharyngeal Swab; Nasopharyngeal(NP) swabs in vial transport medium  Result Value Ref Range Status   SARS Coronavirus 2 by RT PCR NEGATIVE NEGATIVE Final    Comment: (NOTE) SARS-CoV-2 target nucleic acids are NOT DETECTED.  The SARS-CoV-2 RNA is generally detectable in upper respiratory specimens during the acute phase of infection. The lowest concentration of SARS-CoV-2 viral copies this assay can detect is 138  copies/mL. A negative result does not preclude SARS-Cov-2 infection and should not be used as  the sole basis for treatment or other patient management decisions. A negative result may occur with  improper specimen collection/handling, submission of specimen other than nasopharyngeal swab, presence of viral mutation(s) within the areas targeted by this assay, and inadequate number of viral copies(<138 copies/mL). A negative result must be combined with clinical observations, patient history, and epidemiological information. The expected result is Negative.  Fact Sheet for Patients:  BloggerCourse.com  Fact Sheet for Healthcare Providers:  SeriousBroker.it  This test is no t yet approved or cleared by the [place] FDA and  has been authorized for detection and/or diagnosis of SARS-CoV-2 by FDA under an Emergency Use Authorization (EUA). This EUA will remain  in effect (meaning this test can be used) for the duration of the COVID-19 declaration under Section 564(b)(1) of the Act, 21 U.S.C.section 360bbb-3(b)(1), unless the authorization is terminated  or revoked sooner.       Influenza A by PCR NEGATIVE NEGATIVE Final   Influenza B by PCR NEGATIVE NEGATIVE Final    Comment: (NOTE) The Xpert Xpress SARS-CoV-2/FLU/RSV plus assay is intended as an aid in the diagnosis of influenza from Nasopharyngeal swab specimens and should not be used as a sole basis for treatment. Nasal washings and aspirates are unacceptable for Xpert Xpress SARS-CoV-2/FLU/RSV testing.  Fact Sheet for Patients: BloggerCourse.com  Fact Sheet for Healthcare Providers: SeriousBroker.it  This test is not yet approved or cleared by the [place] FDA and has been authorized for detection and/or diagnosis of SARS-CoV-2 by FDA under an Emergency Use Authorization (EUA). This EUA will remain in effect (meaning  this test can be used) for the duration of the COVID-19 declaration under Section 564(b)(1) of the Act, 21 U.S.C. section 360bbb-3(b)(1), unless the authorization is terminated or revoked.  Performed at [facility], [address] [place], [place] [address]   Blood Cultures (routine x 2)     Status: None (Preliminary result)   Collection Time: 01/12/21  1:08 PM   Specimen: BLOOD  Result Value Ref Range Status   Specimen Description   Final    BLOOD LEFT ANTECUBITAL Performed at [facility], [address], [place], [place] [address]    Special Requests   Final    BOTTLES DRAWN AEROBIC AND ANAEROBIC Blood Culture adequate volume Performed at [facility], [address] [place], [place] [address]    Culture   Final    NO GROWTH < 24 HOURS Performed at [facility], [address], [place], [place] [address]    Report Status PENDING  Incomplete    Radiology Reports Abd 1 View (KUB)  Result Date: 01/12/2021 CLINICAL DATA:  Bowel problems. EXAM: ABDOMEN - 1 VIEW COMPARISON:  None. FINDINGS: The bowel gas pattern is normal. There is average stool burden. No radio-opaque calculi or other significant radiographic abnormality are seen. Bilateral hip arthroplasties are present. IMPRESSION: Negative. Electronically Signed   By: Darliss Cheney M.D.   On: 01/12/2021 17:52   CT HEAD WO CONTRAST  Result Date: 01/12/2021 CLINICAL DATA:  Altered mental status.  Dementia. EXAM: CT HEAD WITHOUT CONTRAST TECHNIQUE: Contiguous axial images were obtained from the base of the skull through the vertex without intravenous contrast. COMPARISON:  06/17/2020 FINDINGS: Brain: Ventricles, cisterns and other CSF spaces are within normal. There is minimal chronic ischemic microvascular disease. No mass, mass effect, shift of midline structures or acute hemorrhage. No evidence of acute infarction. Vascular: No hyperdense vessel or unexpected  calcification. Skull:  Normal. Negative for fracture or focal lesion. Sinuses/Orbits: No acute finding. Other: None. IMPRESSION: 1. No acute findings. 2. Minimal chronic ischemic microvascular disease. Electronically Signed   By: Elberta Fortis M.D.   On: 01/12/2021 14:01   DG Chest Port 1 View  Result Date: 01/12/2021 CLINICAL DATA:  Altered mental status.  Wound of the RIGHT hip. EXAM: PORTABLE CHEST 1 VIEW COMPARISON:  02/26/2020 FINDINGS: Heart is enlarged, probably stable in comparison with prior study. There are no focal consolidations or pleural effusions. No pulmonary edema. No pneumothorax or acute displaced fracture. Remote LEFT shoulder arthroplasty. Chronic changes in the RIGHT shoulder. IMPRESSION: Stable cardiomegaly.  No evidence for acute  abnormality. Electronically Signed   By: Norva Pavlov M.D.   On: 01/12/2021 14:30   DG Hip Unilat W or Wo Pelvis 2-3 Views Right  Result Date: 01/12/2021 CLINICAL DATA:  Hip ulcer/wound. Wound of the RIGHT hip. History of dementia. EXAM: DG HIP (WITH OR WITHOUT PELVIS) 2-3V RIGHT COMPARISON:  09/30/2020 FINDINGS: Status post bilateral total hip arthroplasty. Hardware appears well seated. There is no acute fracture or subluxation. There is irregularity of the soft tissue adjacent to the RIGHT greater trochanter, consistent with described ulcer/wound. Apart from this, no soft tissue gas. Visualized bowel gas pattern is nonobstructive. IMPRESSION: No acute fracture. Soft tissue irregularity adjacent to the RIGHT hip. Electronically Signed   By: Norva Pavlov M.D.   On: 01/12/2021 14:29    Lab Data:  CBC: Recent Labs  Lab 01/12/21 1305 01/13/21 0435  WBC 5.0 4.9  NEUTROABS 2.9  --   HGB 12.4 11.4*  HCT 39.7 36.4  MCV 96.6 95.5  PLT 273 252   Basic Metabolic Panel: Recent Labs  Lab 01/12/21 1305 01/13/21 0435  NA 138 138  K 4.0 4.1  CL 104 105  CO2 25 27  GLUCOSE 89 87  BUN 25* 22  CREATININE 0.88 0.85  CALCIUM 9.8 9.4    GFR: Estimated Creatinine Clearance: 49.7 mL/min (by C-G formula based on SCr of 0.85 mg/dL). Liver Function Tests: Recent Labs  Lab 01/12/21 1305 01/13/21 0435  AST 27 31  ALT 14 17  ALKPHOS 70 59  BILITOT 0.8 1.0  PROT 8.5* 7.5  ALBUMIN 4.5 3.8   No results for input(s): LIPASE, AMYLASE in the last 168 hours. No results for input(s): AMMONIA in the last 168 hours. Coagulation Profile: Recent Labs  Lab 01/12/21 1305  INR 1.1   Cardiac Enzymes: No results for input(s): CKTOTAL, CKMB, CKMBINDEX, TROPONINI in the last 168 hours. BNP (last 3 results) No results for input(s): PROBNP in the last 8760 hours. HbA1C: No results for input(s): HGBA1C in the last 72 hours. CBG: Recent Labs  Lab 01/12/21 1318  GLUCAP 72   Lipid Profile: No results for input(s): CHOL, HDL, LDLCALC, TRIG, CHOLHDL, LDLDIRECT in the last 72 hours. Thyroid Function Tests: Recent Labs    01/12/21 1305  TSH 0.771  FREET4 1.24*   Anemia Panel: No results for input(s): VITAMINB12, FOLATE, FERRITIN, TIBC, IRON, RETICCTPCT in the last 72 hours. Urine analysis:    Component Value Date/Time   COLORURINE YELLOW 01/12/2021 1445   APPEARANCEUR HAZY (A) 01/12/2021 1445   LABSPEC 1.015 01/12/2021 1445   PHURINE 6.0 01/12/2021 1445   GLUCOSEU NEGATIVE 01/12/2021 1445   HGBUR SMALL (A) 01/12/2021 1445   BILIRUBINUR NEGATIVE 01/12/2021 1445   BILIRUBINUR negative 08/11/2017 1420   BILIRUBINUR neg 01/11/2014 0939   KETONESUR 5 (A) 01/12/2021 1445   PROTEINUR NEGATIVE  01/12/2021 1445   UROBILINOGEN 0.2 08/11/2017 1420   UROBILINOGEN 0.2 01/11/2012 1027   NITRITE POSITIVE (A) 01/12/2021 1445   LEUKOCYTESUR LARGE (A) 01/12/2021 1445     [name] M.D. [facility] 01/13/2021, 12:02 PM  Available via Epic secure chat 7am-7pm After 7 pm, please refer to night coverage provider listed on amion.

## 2021-01-13 NOTE — ED Notes (Signed)
Requested secretary to order hospital bed

## 2021-01-13 NOTE — Plan of Care (Signed)
Plan of care reviewed with pt and daughter (angie) at bedside. Pt non-verbal at this time and daughter verbalizes understanding and cooperation with care plan. Pt safety maintained.  Problem: Education: Goal: Knowledge of General Education information will improve Description: Including pain rating scale, medication(s)/side effects and non-pharmacologic comfort measures Outcome: Progressing   Problem: Health Behavior/Discharge Planning: Goal: Ability to manage health-related needs will improve Outcome: Progressing   Problem: Clinical Measurements: Goal: Ability to maintain clinical measurements within normal limits will improve Outcome: Progressing Goal: Will remain free from infection Outcome: Progressing Goal: Diagnostic test results will improve Outcome: Progressing Goal: Respiratory complications will improve Outcome: Progressing Goal: Cardiovascular complication will be avoided Outcome: Progressing   Problem: Activity: Goal: Risk for activity intolerance will decrease Outcome: Progressing   Problem: Nutrition: Goal: Adequate nutrition will be maintained Outcome: Progressing   Problem: Coping: Goal: Level of anxiety will decrease Outcome: Progressing   Problem: Elimination: Goal: Will not experience complications related to bowel motility Outcome: Progressing Goal: Will not experience complications related to urinary retention Outcome: Progressing   Problem: Pain Managment: Goal: General experience of comfort will improve Outcome: Progressing   Problem: Safety: Goal: Ability to remain free from injury will improve Outcome: Progressing   Problem: Skin Integrity: Goal: Risk for impaired skin integrity will decrease Outcome: Progressing

## 2021-01-13 NOTE — ED Notes (Signed)
Pt placed on hospital bed

## 2021-01-14 ENCOUNTER — Encounter (HOSPITAL_BASED_OUTPATIENT_CLINIC_OR_DEPARTMENT_OTHER): Payer: MEDICARE | Admitting: Internal Medicine

## 2021-01-14 DIAGNOSIS — G9340 Encephalopathy, unspecified: Secondary | ICD-10-CM

## 2021-01-14 DIAGNOSIS — K5900 Constipation, unspecified: Secondary | ICD-10-CM | POA: Diagnosis not present

## 2021-01-14 DIAGNOSIS — N3001 Acute cystitis with hematuria: Secondary | ICD-10-CM | POA: Diagnosis not present

## 2021-01-14 DIAGNOSIS — I10 Essential (primary) hypertension: Secondary | ICD-10-CM

## 2021-01-14 DIAGNOSIS — G9341 Metabolic encephalopathy: Secondary | ICD-10-CM | POA: Diagnosis not present

## 2021-01-14 LAB — BASIC METABOLIC PANEL
Anion gap: 6 (ref 5–15)
BUN: 16 mg/dL (ref 8–23)
CO2: 28 mmol/L (ref 22–32)
Calcium: 9.8 mg/dL (ref 8.9–10.3)
Chloride: 106 mmol/L (ref 98–111)
Creatinine, Ser: 0.8 mg/dL (ref 0.44–1.00)
GFR, Estimated: >60 mL/min (ref >60)
Glucose, Bld: 93 mg/dL (ref 70–99)
Potassium: 4.8 mmol/L (ref 3.5–5.1)
Sodium: 140 mmol/L (ref 135–145)

## 2021-01-14 MED ORDER — HYDRALAZINE HCL 20 MG/ML IJ SOLN: 10.0000 mg | Freq: Four times a day (QID) | INTRAMUSCULAR | Status: DC | PRN

## 2021-01-14 MED ORDER — MIDODRINE HCL 5 MG PO TABS
5.0000 mg | ORAL_TABLET | Freq: Two times a day (BID) | ORAL | Status: DC
  Administered 2021-01-15 – 2021-01-17 (×6): 5 mg via ORAL
  Filled 2021-01-14 (×7): qty 1

## 2021-01-14 NOTE — Progress Notes (Signed)
   01/14/21 0115  Assess: MEWS Score  Temp 97.9 F (36.6 C)  BP (!) 154/94  Pulse Rate (!) 58  Resp 18  Level of Consciousness Responds to Pain  SpO2 98 %  O2 Device Room Air  Assess: MEWS Score  MEWS Temp 0  MEWS Systolic 0  MEWS Pulse 0  MEWS RR 0  MEWS LOC 2  MEWS Score 2  MEWS Score Color Yellow  Assess: if the MEWS score is Yellow or Red  Were vital signs taken at a resting state? Yes  Focused Assessment No change from prior assessment  Does the patient meet 2 or more of the SIRS criteria? Yes  Does the patient have a confirmed or suspected source of infection? Yes  Provider and Rapid Response Notified? No  MEWS guidelines implemented *See Row Information* No, previously yellow, continue vital signs every 4 hours  Treat  MEWS Interventions Other (Comment)  Pain Scale PAINAD  Pain Score Asleep  Breathing 0  Negative Vocalization 0  Facial Expression 0  Body Language 0  Consolability 0  PAINAD Score 0  Assess: SIRS CRITERIA  SIRS Temperature  0  SIRS Pulse 0  SIRS Respirations  0  SIRS WBC 0  SIRS Score Sum  0  Continuing with Yellow MEWS, Pt  responds to physical stimuli with mumbling

## 2021-01-14 NOTE — TOC Initial Note (Signed)
Transition of Care [facility]) - Initial/Assessment Note    Patient Details  Name: [name] MRN: [id number] Date of Birth: September 20, 1940  Transition of Care [facility]) CM/SW Contact:    Ida Rogue, LCSW Phone Number: 01/14/2021, 3:25 PM  Clinical Narrative:   Spoke with daughter in reference to PT recommendation of SNF.  [name] agrees with recommendation, has had experience with this before with her mother, so knows the drill.  Bed search process explained and initiated.  Plan is for patient to return to daughter's at the end of her rehab time, where she has been getting care, along with an Alzheimer day program.  TOC will continue to follow during the course of hospitalization.                  Expected Discharge Plan: Skilled Nursing Facility Barriers to Discharge: SNF Pending bed offer   Patient Goals and CMS Choice     Choice offered to / list presented to : Adult Children  Expected Discharge Plan and Services Expected Discharge Plan: Skilled Nursing Facility   Discharge Planning Services: CM Consult Post Acute Care Choice: Skilled Nursing Facility Living arrangements for the past 2 months: Single Family Home                                      Prior Living Arrangements/Services Living arrangements for the past 2 months: Single Family Home Lives with:: Adult Children Patient language and need for interpreter reviewed:: Yes        Need for Family Participation in Patient Care: Yes (Comment) Care giver support system in place?: Yes (comment) Current home services: DME Criminal Activity/Legal Involvement Pertinent to Current Situation/Hospitalization: No - Comment as needed  Activities of Daily Living Home Assistive Devices/Equipment: Cane (specify quad or straight), Walker (specify type) (singlepoint cane, 4 wheeled walker) ADL Screening (condition at time of admission) Patient's cognitive ability adequate to safely complete daily activities?: No (patient has  dementia but less responsive) Is the patient deaf or have difficulty hearing?: No Does the patient have difficulty seeing, even when wearing glasses/contacts?: No Does the patient have difficulty concentrating, remembering, or making decisions?: Yes Patient able to express need for assistance with ADLs?: No Does the patient have difficulty dressing or bathing?: Yes Independently performs ADLs?: No (secondary to weakness and not being able to walk) Communication: Independent Dressing (OT): Dependent Is this a change from baseline?: Change from baseline, expected to last >3 days Grooming: Dependent Is this a change from baseline?: Change from baseline, expected to last >3 days Feeding: Dependent Is this a change from baseline?: Change from baseline, expected to last >3 days Bathing: Dependent Is this a change from baseline?: Change from baseline, expected to last >3 days Toileting: Dependent Is this a change from baseline?: Change from baseline, expected to last >3days In/Out Bed: Dependent Is this a change from baseline?: Change from baseline, expected to last >3 days Walks in Home: Dependent Is this a change from baseline?: Change from baseline, expected to last >3 days Does the patient have difficulty walking or climbing stairs?: Yes (secondary to weakness and not able to walk) Weakness of Legs: Both Weakness of Arms/Hands: None  Permission Sought/Granted Permission sought to share information with : Family Supports Permission granted to share information with : No  Share Information with NAME: [name], [name] (Daughter)   [phone]  Emotional Assessment Appearance:: Appears stated age     Orientation: : Oriented to Self Alcohol / Substance Use: Not Applicable Psych Involvement: No (comment)  Admission diagnosis:  Encephalopathy acute [G93.40] Acute cystitis with hematuria [N30.01] Constipation [K59.00] Acute metabolic encephalopathy [G93.41] Patient Active  Problem List   Diagnosis Date Noted   UTI (urinary tract infection) 01/13/2021   Constipation 01/13/2021   Acute metabolic encephalopathy 01/12/2021   Failure to thrive in adult 05/20/2020   Venous insufficiency 05/20/2020   Depression, major, single episode, in partial remission (HCC) 05/20/2020   Late onset Alzheimer's dementia with behavioral disturbance (HCC) 05/20/2020   History of fall 04/05/2020   Chronic anemia 04/05/2020   Bowel and bladder incontinence 04/05/2020   AKI (acute kidney injury) (HCC) 02/26/2020   Hypotension 02/26/2020   Weakness    Midline cystocele 05/03/2019   OAB (overactive bladder) 05/03/2019   Urinary retention 05/03/2019   Uterine procidentia 05/03/2019   Chest pain, negative stress test. non cardiac. 06/16/2018   Primary osteoarthritis of left hip 01/20/2017   Primary osteoarthritis of right hip 11/10/2015   Anxiety state 11/10/2015   Preop cardiovascular exam 07/23/2014   Bilateral edema of lower extremity 07/18/2013    Class: Chronic   Metabolic syndrome 07/18/2013   Rectocele 07/07/2012   Essential hypertension 07/07/2012   GERD (gastroesophageal reflux disease) 07/07/2012   Hyperlipidemia with target LDL less than 100 07/07/2012   Rotator cuff tear, left 03/02/2012   Osteoarthritis of left shoulder 01/21/2012   PCP:  [name], NP Pharmacy:   [phone] [address]ann Sheen, [place] - [address] [place] [address] Phone: [phone] Fax: [phone]     Social Determinants of Health (SDOH) Interventions    Readmission Risk Interventions No flowsheet data found.

## 2021-01-14 NOTE — [place] FL2 (Signed)
MEDICAID FL2 LEVEL OF CARE SCREENING TOOL     IDENTIFICATION  Patient Name: [name] Birthdate: 02-05-1941 Sex: female Admission Date (Current Location): 01/12/2021  [facility] and [place] Number:  Producer, television/film/video and Address:  [facility],  [address] [place], [place] [address]      Provider Number: [id number]  Attending Physician Name and Address:  Cathren Harsh, MD  Relative Name and Phone Number:  Dashawn, Golda (Daughter)   [phone]    Current Level of Care: Hospital Recommended Level of Care: Skilled Nursing Facility Prior Approval Number:    Date Approved/Denied:   PASRR Number: [id number]  Discharge Plan: SNF    Current Diagnoses: Patient Active Problem List   Diagnosis Date Noted   UTI (urinary tract infection) 01/13/2021   Constipation 01/13/2021   Acute metabolic encephalopathy 01/12/2021   Failure to thrive in adult 05/20/2020   Venous insufficiency 05/20/2020   Depression, major, single episode, in partial remission (HCC) 05/20/2020   Late onset Alzheimer's dementia with behavioral disturbance (HCC) 05/20/2020   History of fall 04/05/2020   Chronic anemia 04/05/2020   Bowel and bladder incontinence 04/05/2020   AKI (acute kidney injury) (HCC) 02/26/2020   Hypotension 02/26/2020   Weakness    Midline cystocele 05/03/2019   OAB (overactive bladder) 05/03/2019   Urinary retention 05/03/2019   Uterine procidentia 05/03/2019   Chest pain, negative stress test. non cardiac. 06/16/2018   Primary osteoarthritis of left hip 01/20/2017   Primary osteoarthritis of right hip 11/10/2015   Anxiety state 11/10/2015   Preop cardiovascular exam 07/23/2014   Bilateral edema of lower extremity 07/18/2013   Metabolic syndrome 07/18/2013   Rectocele 07/07/2012   Essential hypertension 07/07/2012   GERD (gastroesophageal reflux disease) 07/07/2012   Hyperlipidemia with target LDL less than 100 07/07/2012   Rotator cuff tear, left  03/02/2012   Osteoarthritis of left shoulder 01/21/2012    Orientation RESPIRATION BLADDER Height & Weight     Self, Place  Normal External catheter Weight: 64.5 kg Height:  5\' 2"  (157.5 cm)  BEHAVIORAL SYMPTOMS/MOOD NEUROLOGICAL BOWEL NUTRITION STATUS   (none observed)   Incontinent Diet (see d/c summary)  AMBULATORY STATUS COMMUNICATION OF NEEDS Skin   Extensive Assist Verbally Other (Comment) (wound, R Hip)                       Personal Care Assistance Level of Assistance  Bathing, Feeding, Dressing Bathing Assistance: Maximum assistance Feeding assistance: Limited assistance Dressing Assistance: Maximum assistance     Functional Limitations Info  Sight, Hearing, Speech Sight Info: Adequate Hearing Info: Adequate Speech Info: Adequate    SPECIAL CARE FACTORS FREQUENCY  PT (By licensed PT), OT (By licensed OT)     PT Frequency: 5X/W OT Frequency: 5X/W            Contractures      Additional Factors Info  Code Status, Allergies Code Status Info: Full Allergies Info: Effexor, Latex, Sulfa Drugs           Current Medications (01/14/2021):  This is the current hospital active medication list Current Facility-Administered Medications  Medication Dose Route Frequency Provider Last Rate Last Admin   aspirin EC tablet 81 mg  81 mg Oral Daily Kyle, Tyrone A, DO   81 mg at 01/14/21 0918   bisacodyl (DULCOLAX) suppository 10 mg  10 mg Rectal Daily PRN 03/16/21, Tyrone A, DO       cefTRIAXone (ROCEPHIN) 1 g in  sodium chloride 0.9 % 100 mL IVPB  1 g Intravenous Q24H Otho Bellows, RPH   Stopped at 01/13/21 1545   enoxaparin (LOVENOX) injection 40 mg  40 mg Subcutaneous Q24H Kyle, Tyrone A, DO   40 mg at 01/13/21 2155   hydrALAZINE (APRESOLINE) injection 10 mg  10 mg Intravenous Q6H PRN Rai, Ripudeep K, MD       hydrOXYzine (ATARAX/VISTARIL) tablet 25 mg  25 mg Oral Daily PRN Ronaldo Miyamoto, Tyrone A, DO   25 mg at 01/13/21 0740   [START ON 01/15/2021] midodrine (PROAMATINE)  tablet 5 mg  5 mg Oral BID WC Rai, Ripudeep K, MD       mirtazapine (REMERON) tablet 15 mg  15 mg Oral QHS Kyle, Tyrone A, DO   15 mg at 01/13/21 2155   ondansetron (ZOFRAN) tablet 4 mg  4 mg Oral Q6H PRN Ronaldo Miyamoto, Tyrone A, DO       Or   ondansetron (ZOFRAN) injection 4 mg  4 mg Intravenous Q6H PRN Ronaldo Miyamoto, Tyrone A, DO       polyethylene glycol (MIRALAX / GLYCOLAX) packet 17 g  17 g Oral BID WC Kyle, Tyrone A, DO   17 g at 01/14/21 8099   senna-docusate (Senokot-S) tablet 2 tablet  2 tablet Oral Daily Kyle, Tyrone A, DO   2 tablet at 01/14/21 8338     Discharge Medications: Please see discharge summary for a list of discharge medications.  Relevant Imaging Results:  Relevant Lab Results:   Additional Information SSN: [id number]  Baldo Daub [place], [place]

## 2021-01-14 NOTE — Progress Notes (Signed)
[facility]                                                                              Patient Demographics  [name], is a 80 y.o. female, DOB - 07/10/1940, KGU:[id number]  Admit date - 01/12/2021   Admitting Physician Teddy Spike, DO  Outpatient Primary MD for the patient is [name] Heir, NP  Outpatient specialists:   LOS - 1  days   Medical records reviewed and are as summarized below:    Chief Complaint  Patient presents with   Urinary Tract Infection       Brief summary   Patient is a 80 year old female with history of chronic constipation, prolapsed bladder, dementia presented with altered mental status.  History from the daughter at the bedside reported that for the last 2 days prior to admission, patient had not been acting herself.  She was having constipation and was receiving laxative.  A day before the admission, she complained of nonspecific stomach pain, felt weak all day and had lack of appetite.  On the morning of admission, patient seemed to be more confused and having more difficulty ambulating and hence was brought to ED for further work-up UA positive for UTI.  Assessment & Plan    Principal Problem:   Acute on chronic metabolic encephalopathy, in the setting of underlying dementia - CT head negative for acute findings.  -UA concerning for UTI, -Urine culture showed multiple species, continue IV Rocephin  -Continue Remeron,  -PT recommended SNF  Active Problems: History of chronic hypotension  -Patient has been on midodrine for chronic hypotension however currently BP has been elevated, will decrease midodrine to 5mg  BID     Failure to thrive in adult -PT evaluation recommended SNF, TOC consulted  Constipation Continue MiraLAX, Senokot  Right hip wound, chronic -Follow wound care recommendations  Code Status: full DVT Prophylaxis:  enoxaparin (LOVENOX) injection 40 mg Start: 01/12/21 2200   Level of Care: Level of  care: Med-Surg Family Communication: Unable to reach patient's daughter on the phone however left a detailed voicemail.  Disposition Plan:     Status is: Inpatient   Dispo: The patient is from: Home              Anticipated d/c is to:  TBD              Patient currently is not medically stable to d/c.  PT recommended SNF   Difficult to place patient No      Time Spent in minutes 25 minutes  Procedures:  none  Consultants:   none  Antimicrobials:   Anti-infectives (From admission, onward)    Start     Dose/Rate Route Frequency Ordered Stop   01/13/21 1600  cefTRIAXone (ROCEPHIN) 1 g in sodium chloride 0.9 % 100 mL IVPB        1 g 200 mL/hr over 30 Minutes Intravenous Every 24 hours 01/12/21 1904     01/13/21 1000  cefTRIAXone (ROCEPHIN) 1 g in sodium chloride 0.9 % 100 mL IVPB  Status:  Discontinued        1 g 200 mL/hr  over 30 Minutes Intravenous Every 24 hours 01/12/21 1649 01/12/21 1904   01/12/21 1530  cefTRIAXone (ROCEPHIN) 1 g in sodium chloride 0.9 % 100 mL IVPB        1 g 200 mL/hr over 30 Minutes Intravenous  Once 01/12/21 1517 01/12/21 1554          Medications  Scheduled Meds:  aspirin EC  81 mg Oral Daily   enoxaparin (LOVENOX) injection  40 mg Subcutaneous Q24H   midodrine  10 mg Oral BID WC   mirtazapine  15 mg Oral QHS   polyethylene glycol  17 g Oral BID WC   senna-docusate  2 tablet Oral Daily   Continuous Infusions:  cefTRIAXone (ROCEPHIN)  IV Stopped (01/13/21 1545)   PRN Meds:.bisacodyl, hydrOXYzine, ondansetron **OR** ondansetron (ZOFRAN) IV      Subjective:   Arliene Rosenow was seen and examined today.  Still somewhat confused however somewhat more alert from yesterday, oriented to self.  No fever/chills, no pain.  Overnight no acute issues. Objective:   Vitals:   01/13/21 1900 01/13/21 2100 01/14/21 0115 01/14/21 0600  BP: (!) 168/88 (!) 145/97 (!) 154/94 (!) 167/100  Pulse: 68 62 (!) 58 90  Resp: 18 17 18 18   Temp: 98.1 F  (36.7 C) 97.7 F (36.5 C) 97.9 F (36.6 C) 98.7 F (37.1 C)  TempSrc:  Axillary Oral Axillary  SpO2: 100% 99% 98% 100%  Weight:      Height:        Intake/Output Summary (Last 24 hours) at 01/14/2021 1119 Last data filed at 01/14/2021 0600 Gross per 24 hour  Intake 160 ml  Output 200 ml  Net -40 ml     Wt Readings from Last 3 Encounters:  01/13/21 64.5 kg  11/14/20 68.5 kg  10/28/20 65.1 kg   Physical Exam General: Alert and oriented x self, NAD Cardiovascular: S1 S2 clear, RRR. No pedal edema b/l Respiratory: CTAB, no wheezing Gastrointestinal: Soft, nontender, nondistended, NBS Ext: no pedal edema bilaterally Neuro: no new deficits   Data Reviewed:  I have personally reviewed following labs and imaging studies  Micro Results Recent Results (from the past 240 hour(s))  Blood Cultures (routine x 2)     Status: None (Preliminary result)   Collection Time: 01/12/21  1:05 PM   Specimen: BLOOD  Result Value Ref Range Status   Specimen Description   Final    BLOOD LEFT ANTECUBITAL Performed at [facility], [address], [place], [place]    Special Requests   Final    BOTTLES DRAWN AEROBIC AND ANAEROBIC Blood Culture adequate volume Performed at [facility], [address], [place], [place]    Culture   Final    NO GROWTH 2 DAYS Performed at [facility], [address], [place], [place]    Report Status PENDING  Incomplete  Resp Panel by RT-PCR (Flu A&B, Covid) Nasopharyngeal Swab     Status: None   Collection Time: 01/12/21  1:05 PM   Specimen: Nasopharyngeal Swab; Nasopharyngeal(NP) swabs in vial transport medium  Result Value Ref Range Status   SARS Coronavirus 2 by RT PCR NEGATIVE NEGATIVE Final    Comment: (NOTE) SARS-CoV-2 target nucleic acids are NOT DETECTED.  The SARS-CoV-2 RNA is generally detectable in upper respiratory specimens during the acute phase of infection. The  lowest concentration of SARS-CoV-2 viral copies this assay can detect is 138 copies/mL. A negative result does not preclude  SARS-Cov-2 infection and should not be used as the sole basis for treatment or other patient management decisions. A negative result may occur with  improper specimen collection/handling, submission of specimen other than nasopharyngeal swab, presence of viral mutation(s) within the areas targeted by this assay, and inadequate number of viral copies(<138 copies/mL). A negative result must be combined with clinical observations, patient history, and epidemiological information. The expected result is Negative.  Fact Sheet for Patients:  BloggerCourse.com  Fact Sheet for Healthcare Providers:  SeriousBroker.it  This test is no t yet approved or cleared by the [place] FDA and  has been authorized for detection and/or diagnosis of SARS-CoV-2 by FDA under an Emergency Use Authorization (EUA). This EUA will remain  in effect (meaning this test can be used) for the duration of the COVID-19 declaration under Section 564(b)(1) of the Act, 21 U.S.C.section 360bbb-3(b)(1), unless the authorization is terminated  or revoked sooner.       Influenza A by PCR NEGATIVE NEGATIVE Final   Influenza B by PCR NEGATIVE NEGATIVE Final    Comment: (NOTE) The Xpert Xpress SARS-CoV-2/FLU/RSV plus assay is intended as an aid in the diagnosis of influenza from Nasopharyngeal swab specimens and should not be used as a sole basis for treatment. Nasal washings and aspirates are unacceptable for Xpert Xpress SARS-CoV-2/FLU/RSV testing.  Fact Sheet for Patients: BloggerCourse.com  Fact Sheet for Healthcare Providers: SeriousBroker.it  This test is not yet approved or cleared by the [place] FDA and has been authorized for detection and/or diagnosis of SARS-CoV-2 by FDA under  an Emergency Use Authorization (EUA). This EUA will remain in effect (meaning this test can be used) for the duration of the COVID-19 declaration under Section 564(b)(1) of the Act, 21 U.S.C. section 360bbb-3(b)(1), unless the authorization is terminated or revoked.  Performed at [facility], [address], [place], [place] [address]   Blood Cultures (routine x 2)     Status: None (Preliminary result)   Collection Time: 01/12/21  1:08 PM   Specimen: BLOOD  Result Value Ref Range Status   Specimen Description   Final    BLOOD LEFT ANTECUBITAL Performed at [facility], [address], [place], [place] [address]    Special Requests   Final    BOTTLES DRAWN AEROBIC AND ANAEROBIC Blood Culture adequate volume Performed at [facility], [address] [place], [place] [address]    Culture   Final    NO GROWTH 2 DAYS Performed at [facility], [address], [place], [place] [address]    Report Status PENDING  Incomplete  Urine Culture     Status: Abnormal   Collection Time: 01/12/21  2:45 PM   Specimen: Urine, Catheterized  Result Value Ref Range Status   Specimen Description   Final    URINE, CATHETERIZED Performed at [facility], [address], [place], [place] [address]    Special Requests   Final    NONE Performed at [facility], [address], [place], [place] [address]    Culture MULTIPLE SPECIES PRESENT, SUGGEST RECOLLECTION (A)  Final   Report Status 01/13/2021 FINAL  Final    Radiology Reports Abd 1 View (KUB)  Result Date: 01/12/2021 CLINICAL DATA:  Bowel problems. EXAM: ABDOMEN - 1 VIEW COMPARISON:  None. FINDINGS: The bowel gas pattern is normal. There is average stool burden. No radio-opaque calculi or other significant radiographic abnormality are seen. Bilateral hip arthroplasties  are present. IMPRESSION: Negative. Electronically Signed   By: Darliss Cheney M.D.   On: 01/12/2021 17:52   CT HEAD WO CONTRAST  Result Date: 01/12/2021 CLINICAL DATA:  Altered mental status.  Dementia. EXAM: CT HEAD WITHOUT CONTRAST TECHNIQUE: Contiguous axial images were obtained from the base of the skull through the vertex without intravenous contrast. COMPARISON:  06/17/2020 FINDINGS: Brain: Ventricles, cisterns and other CSF spaces are within normal. There is minimal chronic ischemic microvascular disease. No mass, mass effect, shift of midline structures or acute hemorrhage. No evidence of acute infarction. Vascular: No hyperdense vessel or unexpected calcification. Skull: Normal. Negative for fracture or focal lesion. Sinuses/Orbits: No acute finding. Other: None. IMPRESSION: 1. No acute findings. 2. Minimal chronic ischemic microvascular disease. Electronically Signed   By: Elberta Fortis M.D.   On: 01/12/2021 14:01   DG Chest Port 1 View  Result Date: 01/12/2021 CLINICAL DATA:  Altered mental status.  Wound of the RIGHT hip. EXAM: PORTABLE CHEST 1 VIEW COMPARISON:  02/26/2020 FINDINGS: Heart is enlarged, probably stable in comparison with prior study. There are no focal consolidations or pleural effusions. No pulmonary edema. No pneumothorax or acute displaced fracture. Remote LEFT shoulder arthroplasty. Chronic changes in the RIGHT shoulder. IMPRESSION: Stable cardiomegaly.  No evidence for acute  abnormality. Electronically Signed   By: Norva Pavlov M.D.   On: 01/12/2021 14:30   DG Hip Unilat W or Wo Pelvis 2-3 Views Right  Result Date: 01/12/2021 CLINICAL DATA:  Hip ulcer/wound. Wound of the RIGHT hip. History of dementia. EXAM: DG HIP (WITH OR WITHOUT PELVIS) 2-3V RIGHT COMPARISON:  09/30/2020 FINDINGS: Status post bilateral total hip arthroplasty. Hardware appears well seated. There is no acute fracture or subluxation. There is irregularity of the soft tissue adjacent to the RIGHT greater trochanter, consistent with described ulcer/wound. Apart from  this, no soft tissue gas. Visualized bowel gas pattern is nonobstructive. IMPRESSION: No acute fracture. Soft tissue irregularity adjacent to the RIGHT hip. Electronically Signed   By: Norva Pavlov M.D.   On: 01/12/2021 14:29    Lab Data:  CBC: Recent Labs  Lab 01/12/21 1305 01/13/21 0435  WBC 5.0 4.9  NEUTROABS 2.9  --   HGB 12.4 11.4*  HCT 39.7 36.4  MCV 96.6 95.5  PLT 273 252   Basic Metabolic Panel: Recent Labs  Lab 01/12/21 1305 01/13/21 0435 01/14/21 0415  NA 138 138 140  K 4.0 4.1 4.8  CL 104 105 106  CO2 25 27 28   GLUCOSE 89 87 93  BUN 25* 22 16  CREATININE 0.88 0.85 0.80  CALCIUM 9.8 9.4 9.8   GFR: Estimated Creatinine Clearance: 51.1 mL/min (by C-G formula based on SCr of 0.8 mg/dL). Liver Function Tests: Recent Labs  Lab 01/12/21 1305 01/13/21 0435  AST 27 31  ALT 14 17  ALKPHOS 70 59  BILITOT 0.8 1.0  PROT 8.5* 7.5  ALBUMIN 4.5 3.8   No results for input(s): LIPASE, AMYLASE in the last 168 hours. No results for input(s): AMMONIA in the last 168 hours. Coagulation Profile: Recent Labs  Lab 01/12/21 1305  INR 1.1   Cardiac Enzymes: No results for input(s): CKTOTAL, CKMB, CKMBINDEX, TROPONINI in the last 168 hours. BNP (last 3 results) No results for input(s): PROBNP in the last 8760 hours. HbA1C: No results for input(s): HGBA1C in the last 72 hours. CBG: Recent Labs  Lab 01/12/21 1318  GLUCAP 72   Lipid Profile: No results for input(s): CHOL, HDL, LDLCALC, TRIG, CHOLHDL, LDLDIRECT  in the last 72 hours. Thyroid Function Tests: Recent Labs    01/12/21 1305  TSH 0.771  FREET4 1.24*   Anemia Panel: No results for input(s): VITAMINB12, FOLATE, FERRITIN, TIBC, IRON, RETICCTPCT in the last 72 hours. Urine analysis:    Component Value Date/Time   COLORURINE YELLOW 01/12/2021 1445   APPEARANCEUR HAZY (A) 01/12/2021 1445   LABSPEC 1.015 01/12/2021 1445   PHURINE 6.0 01/12/2021 1445   GLUCOSEU NEGATIVE 01/12/2021 1445   HGBUR  SMALL (A) 01/12/2021 1445   BILIRUBINUR NEGATIVE 01/12/2021 1445   BILIRUBINUR negative 08/11/2017 1420   BILIRUBINUR neg 01/11/2014 0939   KETONESUR 5 (A) 01/12/2021 1445   PROTEINUR NEGATIVE 01/12/2021 1445   UROBILINOGEN 0.2 08/11/2017 1420   UROBILINOGEN 0.2 01/11/2012 1027   NITRITE POSITIVE (A) 01/12/2021 1445   LEUKOCYTESUR LARGE (A) 01/12/2021 1445     Matrice Herro M.D. [facility] 01/14/2021, 11:19 AM  Available via Epic secure chat 7am-7pm After 7 pm, please refer to night coverage provider listed on amion.

## 2021-01-15 DIAGNOSIS — G9341 Metabolic encephalopathy: Secondary | ICD-10-CM | POA: Diagnosis not present

## 2021-01-15 NOTE — Progress Notes (Signed)
Physical Therapy Treatment Patient Details Name: [name] MRN: [id number] DOB: September 27, 1940 Today's Date: 01/15/2021   History of Present Illness Patient is a 80 year old female with history of chronic constipation, prolapsed bladder, dementia presented with altered mental status,, patient seemed to be more confused and having more difficulty ambulating and hence was brought to ED 01/12/21 for further work-up for UTI. Chronic right hip wound.    PT Comments    Pt assisted with sitting EOB and even able to stand with mod assist +2 today.  Pt lethargic and keeping eyes closed however so requiring variable assist at times (such as total assist +2 for all bed mobility).  Continue to recommend SNF upon d/c.    Recommendations for follow up therapy are one component of a multi-disciplinary discharge planning process, led by the attending physician.  Recommendations may be updated based on patient status, additional functional criteria and insurance authorization.  Follow Up Recommendations  SNF     Equipment Recommendations  None recommended by PT    Recommendations for Other Services       Precautions / Restrictions Precautions Precautions: Fall     Mobility  Bed Mobility Overal bed mobility: Needs Assistance Bed Mobility: Rolling;Supine to Sit;Sit to Supine Rolling: Total assist;+2 for physical assistance   Supine to sit: Total assist;+2 for physical assistance Sit to supine: Total assist;+2 for physical assistance   General bed mobility comments: attempted for pt to self assist however pt not participating/initiating and required total assist +2    Transfers Overall transfer level: Needs assistance Equipment used: 2 person hand held assist Transfers: Sit to/from Stand Sit to Stand: Mod assist         General transfer comment: provided bil HHA and requested pt to stand, and pt did participate and assist as able with standing but kept eyes closed  Ambulation/Gait              General Gait Details: deferred for safety with cognition and eyes closed today   Stairs             Wheelchair Mobility    Modified Rankin (Stroke Patients Only)       Balance Overall balance assessment: Needs assistance Sitting-balance support: Feet supported;No upper extremity supported   Sitting balance - Comments: pt did not self assist with UEs however briefly able to maintain static upright posture without external assist but would start to posterior lean so provided assist, performed upright posture 2-3 times and asked pt to hold, then assit provided as pt fatigued.                                    Cognition Arousal/Alertness: Lethargic Behavior During Therapy: Flat affect Overall Cognitive Status: History of cognitive impairments - at baseline                                 General Comments: hx dementia, pt made no verbalizations during session, pt briefly opened opens with sitting and rolling however mostly eyes remained closed      Exercises      General Comments        Pertinent Vitals/Pain Pain Assessment: Faces Faces Pain Scale: Hurts little more Pain Location: when rolling Pain Descriptors / Indicators: Grimacing Pain Intervention(s): Repositioned;Monitored during session    Home Living  Prior Function            PT Goals (current goals can now be found in the care plan section) Progress towards PT goals: Progressing toward goals    Frequency    Min 2X/week      PT Plan Current plan remains appropriate    Co-evaluation              AM-PAC PT "6 Clicks" Mobility   Outcome Measure  Help needed turning from your back to your side while in a flat bed without using bedrails?: Total Help needed moving from lying on your back to sitting on the side of a flat bed without using bedrails?: Total Help needed moving to and from a bed to a chair (including a  wheelchair)?: A Lot Help needed standing up from a chair using your arms (e.g., wheelchair or bedside chair)?: A Lot Help needed to walk in hospital room?: Total Help needed climbing 3-5 steps with a railing? : Total 6 Click Score: 8    End of Session Equipment Utilized During Treatment: Gait belt Activity Tolerance: Patient limited by lethargy;Patient limited by fatigue Patient left: in bed;with call bell/phone within reach;with bed alarm set   PT Visit Diagnosis: Difficulty in walking, not elsewhere classified (R26.2);Muscle weakness (generalized) (M62.81)     Time: 5916-3846 PT Time Calculation (min) (ACUTE ONLY): 18 min  Charges:  $Therapeutic Activity: 8-22 mins           Thomasene Mohair PT, DPT Acute Rehabilitation Services Pager: [phone] Office: [phone]  [name] 01/15/2021, 3:19 PM

## 2021-01-15 NOTE — Plan of Care (Signed)
  Problem: Health Behavior/Discharge Planning: Goal: Ability to manage health-related needs will improve Outcome: Progressing   Problem: Clinical Measurements: Goal: Ability to maintain clinical measurements within normal limits will improve Outcome: Progressing Goal: Will remain free from infection Outcome: Progressing Goal: Diagnostic test results will improve Outcome: Progressing Goal: Respiratory complications will improve Outcome: Progressing Goal: Cardiovascular complication will be avoided Outcome: Progressing   Problem: Nutrition: Goal: Adequate nutrition will be maintained Outcome: Progressing   Problem: Elimination: Goal: Will not experience complications related to bowel motility Outcome: Progressing

## 2021-01-15 NOTE — TOC Progression Note (Signed)
Transition of Care [facility]) - Progression Note    Patient Details  Name: [name] MRN: [id number] Date of Birth: 10/05/1940  Transition of Care [facility]) CM/SW Contact  Ida Rogue, [place] Phone Number: 01/15/2021, 2:50 PM  Clinical Narrative:   Daughter is now saying she plans to take patient home with [facility] services as she will require surgery through [facility] later this month, and daughter wants to make sure she can get her mother into SNF for rehab at that point. Contacted Cindie with [facility] who agrees to service patient's HH needs. TOC will continue to follow during the course of hospitalization.     Expected Discharge Plan: Home w Home Health Services Barriers to Discharge: Barriers Resolved  Expected Discharge Plan and Services Expected Discharge Plan: Home w Home Health Services   Discharge Planning Services: CM Consult Post Acute Care Choice: Skilled Nursing Facility Living arrangements for the past 2 months: Single Family Home                                       Social Determinants of Health (SDOH) Interventions    Readmission Risk Interventions No flowsheet data found.

## 2021-01-15 NOTE — Progress Notes (Addendum)
PROGRESS NOTE    [name]   BJY:[id number]  DOB: 19-Dec-1940  PCP: [name], NP    DOA: 01/12/2021 LOS: 2    Brief Narrative / Hospital Course to Date:   No notes on file   Assessment & Plan   Principal Problem:   Acute metabolic encephalopathy Active Problems:   Essential hypertension   Hypotension   Failure to thrive in adult   Late onset Alzheimer's dementia with behavioral disturbance (HCC)   UTI (urinary tract infection)   Constipation   Acute metabolic encephalopathy superimposed on baseline dementia -head CT negative for any acute findings.  UA consistent with UTI.  Acute mental status changes most likely secondary to infection.  Mental status is improved but not quite at baseline today.  Initial urine culture grew multiple species. -- Collect repeat urine culture --Continue empiric Rocephin --Continue home Remeron --PT recommending SNF, patient's daughter prefers home with home health PT --Delirium precautions  Bladder prolapse - chronic, increased risk of recurrent UTI's. --will get repeat urine culture --daughter reports Gyn surgery is planned in near future  Chronic hypotension -on midodrine PTA which has been decreased due to elevated BPs here.  Monitor blood pressure.  Failure to thrive in adult -likely due to progressive dementia.  Patient goes to adult daycare.  Has had fairly poor p.o. intake per daughter.  Encourage oral intake and mobility is much as tolerated.  Constipation, chronic - Continue bowel regimen per orders  Chronic right hip wound -WOC consulted, continue wound care per their recommendations.  Patient follows at wound care center  Patient BMI: Body mass index is 26.01 kg/m.   DVT prophylaxis: enoxaparin (LOVENOX) injection 40 mg Start: 01/12/21 2200   Diet:  Diet Orders (From admission, onward)     Start     Ordered   01/12/21 1649  Diet Heart Room service appropriate? Yes; Fluid consistency: Thin  Diet effective now        Question Answer Comment  Room service appropriate? Yes   Fluid consistency: Thin      01/12/21 1649              Code Status: Full Code   Subjective 01/15/21    Patient seen with daughter at bedside on rounds this morning.  Patient lives at home with daughter and goes to adult daycare for dementia patients.  Daughter reports improvement in her mental status but not quite at her baseline yet.  She is concerned about patient having recurrent UTI due to planned surgery for bladder prolapse and possible delays due to infections.  Daughter does not think patient would tolerate SNF rehab due to her dementia.   Disposition Plan & Communication   Status is: Inpatient  Remains inpatient appropriate because:IV treatments appropriate due to intensity of illness or inability to take PO, repeat urine culture pending  Dispo: The patient is from: Home              Anticipated d/c is to: Home              Patient currently is not medically stable to d/c.   Difficult to place patient No   Family Communication: Daughter at bedside on rounds today, 10/5.   Consults, Procedures, Significant Events   Consultants:  None  Procedures:  Wound care  Antimicrobials:  Anti-infectives (From admission, onward)    Start     Dose/Rate Route Frequency Ordered Stop   01/13/21 1600  cefTRIAXone (ROCEPHIN) 1 g in  sodium chloride 0.9 % 100 mL IVPB        1 g 200 mL/hr over 30 Minutes Intravenous Every 24 hours 01/12/21 1904     01/13/21 1000  cefTRIAXone (ROCEPHIN) 1 g in sodium chloride 0.9 % 100 mL IVPB  Status:  Discontinued        1 g 200 mL/hr over 30 Minutes Intravenous Every 24 hours 01/12/21 1649 01/12/21 1904   01/12/21 1530  cefTRIAXone (ROCEPHIN) 1 g in sodium chloride 0.9 % 100 mL IVPB        1 g 200 mL/hr over 30 Minutes Intravenous  Once 01/12/21 1517 01/12/21 1554         Micro    Objective   Vitals:   01/14/21 0600 01/14/21 2157 01/15/21 0528 01/15/21 1300  BP: (!)  167/100 (!) 139/92 (!) 141/83 133/78  Pulse: 90 78 81 (!) 59  Resp: 18 20 18 16   Temp: 98.7 F (37.1 C) 97.7 F (36.5 C) 97.6 F (36.4 C) 97.8 F (36.6 C)  TempSrc: Axillary Oral Oral Oral  SpO2: 100% 97% 99%   Weight:      Height:        Intake/Output Summary (Last 24 hours) at 01/15/2021 1805 Last data filed at 01/15/2021 1600 Gross per 24 hour  Intake 631.34 ml  Output 100 ml  Net 531.34 ml   Filed Weights   01/12/21 1254 01/13/21 1700  Weight: 69 kg 64.5 kg    Physical Exam:  General exam: awake, alert, no acute distress HEENT: atraumatic, clear conjunctiva, anicteric sclera, moist mucus membranes, hearing grossly normal  Respiratory system: CTAB, no wheezes, rales or rhonchi, normal respiratory effort. Cardiovascular system: normal S1/S2, RRR, no pedal edema.   Gastrointestinal system: soft, NT, ND, no HSM felt, +bowel sounds. Central nervous system: A&O to self and place. no gross focal neurologic deficits, normal speech Extremities: Right lateral thigh wound without drainage or surrounding erythema to suggest infection, moves all, no edema, normal tone Psychiatry: normal mood, congruent affect  Labs   Data Reviewed: I have personally reviewed following labs and imaging studies  CBC: Recent Labs  Lab 01/12/21 1305 01/13/21 0435  WBC 5.0 4.9  NEUTROABS 2.9  --   HGB 12.4 11.4*  HCT 39.7 36.4  MCV 96.6 95.5  PLT 273 252   Basic Metabolic Panel: Recent Labs  Lab 01/12/21 1305 01/13/21 0435 01/14/21 0415  NA 138 138 140  K 4.0 4.1 4.8  CL 104 105 106  CO2 25 27 28   GLUCOSE 89 87 93  BUN 25* 22 16  CREATININE 0.88 0.85 0.80  CALCIUM 9.8 9.4 9.8   GFR: Estimated Creatinine Clearance: 51.1 mL/min (by C-G formula based on SCr of 0.8 mg/dL). Liver Function Tests: Recent Labs  Lab 01/12/21 1305 01/13/21 0435  AST 27 31  ALT 14 17  ALKPHOS 70 59  BILITOT 0.8 1.0  PROT 8.5* 7.5  ALBUMIN 4.5 3.8   No results for input(s): LIPASE, AMYLASE in the  last 168 hours. No results for input(s): AMMONIA in the last 168 hours. Coagulation Profile: Recent Labs  Lab 01/12/21 1305  INR 1.1   Cardiac Enzymes: No results for input(s): CKTOTAL, CKMB, CKMBINDEX, TROPONINI in the last 168 hours. BNP (last 3 results) No results for input(s): PROBNP in the last 8760 hours. HbA1C: No results for input(s): HGBA1C in the last 72 hours. CBG: Recent Labs  Lab 01/12/21 1318  GLUCAP 72   Lipid Profile: No results for input(s): CHOL,  HDL, LDLCALC, TRIG, CHOLHDL, LDLDIRECT in the last 72 hours. Thyroid Function Tests: No results for input(s): TSH, T4TOTAL, FREET4, T3FREE, THYROIDAB in the last 72 hours. Anemia Panel: No results for input(s): VITAMINB12, FOLATE, FERRITIN, TIBC, IRON, RETICCTPCT in the last 72 hours. Sepsis Labs: Recent Labs  Lab 01/12/21 1305  LATICACIDVEN 1.2    Recent Results (from the past 240 hour(s))  Blood Cultures (routine x 2)     Status: None (Preliminary result)   Collection Time: 01/12/21  1:05 PM   Specimen: BLOOD  Result Value Ref Range Status   Specimen Description   Final    BLOOD LEFT ANTECUBITAL Performed at [facility], [address] [place], [place] [address]    Special Requests   Final    BOTTLES DRAWN AEROBIC AND ANAEROBIC Blood Culture adequate volume Performed at [facility], [address], [place], [place] [address]    Culture   Final    NO GROWTH 3 DAYS Performed at [facility], [address], [place], [place] [address]    Report Status PENDING  Incomplete  Resp Panel by RT-PCR (Flu A&B, Covid) Nasopharyngeal Swab     Status: None   Collection Time: 01/12/21  1:05 PM   Specimen: Nasopharyngeal Swab; Nasopharyngeal(NP) swabs in vial transport medium  Result Value Ref Range Status   SARS Coronavirus 2 by RT PCR NEGATIVE NEGATIVE Final    Comment: (NOTE) SARS-CoV-2 target nucleic acids are NOT DETECTED.  The SARS-CoV-2 RNA is generally  detectable in upper respiratory specimens during the acute phase of infection. The lowest concentration of SARS-CoV-2 viral copies this assay can detect is 138 copies/mL. A negative result does not preclude SARS-Cov-2 infection and should not be used as the sole basis for treatment or other patient management decisions. A negative result may occur with  improper specimen collection/handling, submission of specimen other than nasopharyngeal swab, presence of viral mutation(s) within the areas targeted by this assay, and inadequate number of viral copies(<138 copies/mL). A negative result must be combined with clinical observations, patient history, and epidemiological information. The expected result is Negative.  Fact Sheet for Patients:  BloggerCourse.com  Fact Sheet for Healthcare Providers:  SeriousBroker.it  This test is no t yet approved or cleared by the [place] FDA and  has been authorized for detection and/or diagnosis of SARS-CoV-2 by FDA under an Emergency Use Authorization (EUA). This EUA will remain  in effect (meaning this test can be used) for the duration of the COVID-19 declaration under Section 564(b)(1) of the Act, 21 U.S.C.section 360bbb-3(b)(1), unless the authorization is terminated  or revoked sooner.       Influenza A by PCR NEGATIVE NEGATIVE Final   Influenza B by PCR NEGATIVE NEGATIVE Final    Comment: (NOTE) The Xpert Xpress SARS-CoV-2/FLU/RSV plus assay is intended as an aid in the diagnosis of influenza from Nasopharyngeal swab specimens and should not be used as a sole basis for treatment. Nasal washings and aspirates are unacceptable for Xpert Xpress SARS-CoV-2/FLU/RSV testing.  Fact Sheet for Patients: BloggerCourse.com  Fact Sheet for Healthcare Providers: SeriousBroker.it  This test is not yet approved or cleared by the [place] FDA  and has been authorized for detection and/or diagnosis of SARS-CoV-2 by FDA under an Emergency Use Authorization (EUA). This EUA will remain in effect (meaning this test can be used) for the duration of the COVID-19 declaration under Section 564(b)(1) of the Act, 21 U.S.C. section 360bbb-3(b)(1), unless the authorization is  terminated or revoked.  Performed at [facility], [address] [place], [place] [address]   Blood Cultures (routine x 2)     Status: None (Preliminary result)   Collection Time: 01/12/21  1:08 PM   Specimen: BLOOD  Result Value Ref Range Status   Specimen Description   Final    BLOOD LEFT ANTECUBITAL Performed at [facility], [address] [place], [place] [address]    Special Requests   Final    BOTTLES DRAWN AEROBIC AND ANAEROBIC Blood Culture adequate volume Performed at [facility], [address] [place], [place] [address]    Culture   Final    NO GROWTH 3 DAYS Performed at [facility], [address], [place], [place] [address]    Report Status PENDING  Incomplete  Urine Culture     Status: Abnormal   Collection Time: 01/12/21  2:45 PM   Specimen: Urine, Catheterized  Result Value Ref Range Status   Specimen Description   Final    URINE, CATHETERIZED Performed at [facility], [address] [place], [place] [address]    Special Requests   Final    NONE Performed at [facility], [address], [place], [place] [address]    Culture MULTIPLE SPECIES PRESENT, SUGGEST RECOLLECTION (A)  Final   Report Status 01/13/2021 FINAL  Final      Imaging Studies   No results found.   Medications   Scheduled Meds:  aspirin EC  81 mg Oral Daily   enoxaparin (LOVENOX) injection  40 mg Subcutaneous Q24H   midodrine  5 mg Oral BID WC   mirtazapine  15 mg Oral QHS   polyethylene glycol  17 g Oral BID WC   senna-docusate  2 tablet Oral Daily    Continuous Infusions:  cefTRIAXone (ROCEPHIN)  IV 200 mL/hr at 01/15/21 1600       LOS: 2 days    Time spent: 30 minutes    Pennie Banter, DO [facility]  01/15/2021, 6:05 PM      If 7PM-7AM, please contact night-coverage. How to contact the [facility] Attending or Consulting provider 7A - 7P or covering provider during after hours 7P -7A, for this patient?    Check the care team in [facility] and look for a) attending/consulting TRH provider listed and b) the [facility] team listed Log into [email or web address] and use [facility]'s universal password to access. If you do not have the password, please contact the hospital operator. Locate the [facility] provider you are looking for under [facility] and page to a number that you can be directly reached. If you still have difficulty reaching the provider, please page the [facility] (Director on Call) for the Hospitalists listed on amion for assistance.

## 2021-01-16 DIAGNOSIS — G9341 Metabolic encephalopathy: Secondary | ICD-10-CM | POA: Diagnosis not present

## 2021-01-16 MED ORDER — CEFDINIR 300 MG PO CAPS: 300.0000 mg | ORAL_CAPSULE | Freq: Two times a day (BID) | ORAL | 0 refills | Status: DC

## 2021-01-16 NOTE — Care Management Important Message (Signed)
Important Message  Patient Details IM Letter placed in Patients room. Name: [name] MRN: [id number] Date of Birth: 1940/07/25   Medicare Important Message Given:  Yes     Caren Macadam 01/16/2021, 12:32 PM

## 2021-01-16 NOTE — Discharge Summary (Signed)
Physician Discharge Summary  [name]:[id number] DOB: July 28, 1940 DOA: 01/12/2021  PCP: [name], NP  Admit date: 01/12/2021 Discharge date: 01/17/2021  Admitted From: home Disposition:  home  Recommendations for Outpatient Follow-up:  Follow up with PCP in 1-2 weeks Please obtain BMP/CBC in one week Please follow up with gynecology regarding bladder prolapse  Home Health: PT, OT, RN  Equipment/Devices: None   Discharge Condition: Stable  CODE STATUS: Full  Diet recommendation: Heart Healthy     Discharge Diagnoses: Principal Problem:   Acute metabolic encephalopathy Active Problems:   Essential hypertension   Hypotension   Failure to thrive in adult   Late onset Alzheimer's dementia with behavioral disturbance (HCC)   UTI (urinary tract infection)   Constipation    Summary of HPI and Hospital Course:   Patient is a 80 year old female with history of chronic constipation, prolapsed bladder, dementia presented with altered mental status.  History from the daughter at the bedside reported that for the last 2 days prior to admission, patient had not been acting herself.  She was having constipation and was receiving laxative.  A day before the admission, she complained of nonspecific stomach pain, felt weak all day and had lack of appetite.  On the morning of admission, patient seemed to be more confused and having more difficulty ambulating and hence was brought to ED for further work-up.  UA positive for UTI.    A&P Acute metabolic encephalopathy due to Complicated UTI with baseline dementia -head CT negative for any acute findings.  UA consistent with UTI.  Acute mental status changes most likely secondary to infection.  Mental status is improved but not quite at baseline today.  Initial urine culture grew multiple species.  --Treated with  empiric Rocephin --Discharged on 5 more days Omnicef to complete 10-day course given complicated by bladder prolapse.    Repeat urine culture sent and pending, expect it will  also show contamination / colonization.    --Continue home Remeron  --PT recommending SNF, patient's daughter prefers home with home health PT to save SNF days for postop after bladder prolapse surgery. Home health services arranged including PT, OT and RN to assist with wound care and close monitoring.    Bladder prolapse - chronic, increased risk of recurrent UTI's.  Daughter reports Gyn surgery to repair this is planned in near future   Chronic hypotension -on midodrine PTA which has been decreased due to elevated BPs here.  Monitor blood pressure.  Failure to thrive in adult -likely due to progressive dementia.  Patient goes to adult daycare.  Has had fairly poor p.o. intake per daughter.  Encourage oral intake and mobility is much as tolerated.   Constipation, chronic - Continue bowel regimen per orders  Chronic right hip wound -WOC consulted, continue wound care per their recommendations.  Patient follows at wound care center. --Follow up in wound care clinic.      Discharge Instructions    Discharge Instructions     Call MD for:  extreme fatigue   Complete by: As directed    Call MD for:  persistant dizziness or light-headedness   Complete by: As directed    Call MD for:  persistant nausea and vomiting   Complete by: As directed    Call MD for:  severe uncontrolled pain   Complete by: As directed    Call MD for:  temperature >100.4   Complete by: As directed    Diet - low sodium heart  healthy   Complete by: As directed    Discharge instructions   Complete by: As directed    Please continue with antibiotic (cefdinir, also called Omnicef) for three more days. Follow up with your Primary Care Doctor and your Gynecologist to make sure your infection has cleared.  Your bladder prolapse is a risk for recurrent urinary tract infections.    At this point, you do not have any signs of infection remainging - no  fevers, mental status is back to normal.    The urine culture we collected grew multiple bacteria, which means your bladder just has some bacteria in it.  This is because of the prolapse.  We collected a new urine culture to see if any certain bacteria has not been cleared by the antibiotics given here in the hospital.  If it shows [place] anything concerning, I will call to let you know and give instructions.  Most important is to follow up with gynecology and primary care.   --Dr. Esaw Grandchild, DO   [facility]   Discharge wound care:   Complete by: As directed    Continue previous wound care as you were doing at home before. Also follow up in the wound care center as scheduled.   Increase activity slowly   Complete by: As directed       Allergies as of 01/16/2021       Reactions   Effexor [venlafaxine Hcl] Swelling, Other (See Comments)   MD ADVISES PATIENT TO NOT TAKE IN FUTURE PT STOPPED, MD DC'd MED   Latex Rash   Sulfa Drugs Cross Reactors Itching        Medication List     STOP taking these medications    hydrocortisone 2.5 % rectal cream Commonly known as: ANUSOL-HC       TAKE these medications    acetaminophen 500 MG tablet Commonly known as: TYLENOL Take 500 mg by mouth every 6 (six) hours as needed for headache (painb).   aspirin 81 MG EC tablet Chew 81 mg by mouth daily.   cefdinir 300 MG capsule Commonly known as: OMNICEF Take 1 capsule (300 mg total) by mouth 2 (two) times daily for 3 days.   hydrOXYzine 25 MG tablet Commonly known as: ATARAX/VISTARIL Take 1 tablet (25 mg total) by mouth 3 (three) times daily as needed. What changed:  when to take this reasons to take this   midodrine 10 MG tablet Commonly known as: PROAMATINE TAKE 1 TABLET BY MOUTH TWICE A DAY WITH MEALS   mirtazapine 15 MG tablet Commonly known as: Remeron Take 1 tablet (15 mg total) by mouth at bedtime.   nutrition supplement (JUVEN) Pack Take 1 packet by mouth  every other day. Alternate with pedialyte   Pedialyte Soln Take 120 mLs by mouth every other day. Alternate with Juven   polyethylene glycol powder 17 GM/SCOOP powder Commonly known as: GLYCOLAX/MIRALAX Mix 1 capful of powder in drink and take by mouth one to three times daily as needed for daily soft stools OTC What changed:  how much to take how to take this when to take this additional instructions   sennosides-docusate sodium 8.6-50 MG tablet Commonly known as: SENOKOT-S Take 2 tablets by mouth daily.   sodium phosphate 7-19 GM/118ML Enem Place 1 enema rectally once as needed for severe constipation.   trimethoprim 100 MG tablet Commonly known as: TRIMPEX Take 1 tablet (100 mg total) by mouth daily.  Discharge Care Instructions  (From admission, onward)           Start     Ordered   01/16/21 0000  Discharge wound care:       Comments: Continue previous wound care as you were doing at home before. Also follow up in the wound care center as scheduled.   01/16/21 0944            Allergies  Allergen Reactions   Effexor [Venlafaxine Hcl] Swelling and Other (See Comments)    MD ADVISES PATIENT TO NOT TAKE IN FUTURE PT STOPPED, MD DC'd MED   Latex Rash   Sulfa Drugs Cross Reactors Itching     If you experience worsening of your admission symptoms, develop shortness of breath, life threatening emergency, suicidal or homicidal thoughts you must seek medical attention immediately by calling 911 or calling your MD immediately  if symptoms less severe.    Please note   You were cared for by a hospitalist during your hospital stay. If you have any questions about your discharge medications or the care you received while you were in the hospital after you are discharged, you can call the unit and asked to speak with the hospitalist on call if the hospitalist that took care of you is not available. Once you are discharged, your primary care  physician will handle any further medical issues. Please note that NO REFILLS for any discharge medications will be authorized once you are discharged, as it is imperative that you return to your primary care physician (or establish a relationship with a primary care physician if you do not have one) for your aftercare needs so that they can reassess your need for medications and monitor your lab values.   Consultations: none    Procedures/Studies: Abd 1 View (KUB)  Result Date: 01/12/2021 CLINICAL DATA:  Bowel problems. EXAM: ABDOMEN - 1 VIEW COMPARISON:  None. FINDINGS: The bowel gas pattern is normal. There is average stool burden. No radio-opaque calculi or other significant radiographic abnormality are seen. Bilateral hip arthroplasties are present. IMPRESSION: Negative. Electronically Signed   By: Darliss Cheney M.D.   On: 01/12/2021 17:52   CT HEAD WO CONTRAST  Result Date: 01/12/2021 CLINICAL DATA:  Altered mental status.  Dementia. EXAM: CT HEAD WITHOUT CONTRAST TECHNIQUE: Contiguous axial images were obtained from the base of the skull through the vertex without intravenous contrast. COMPARISON:  06/17/2020 FINDINGS: Brain: Ventricles, cisterns and other CSF spaces are within normal. There is minimal chronic ischemic microvascular disease. No mass, mass effect, shift of midline structures or acute hemorrhage. No evidence of acute infarction. Vascular: No hyperdense vessel or unexpected calcification. Skull: Normal. Negative for fracture or focal lesion. Sinuses/Orbits: No acute finding. Other: None. IMPRESSION: 1. No acute findings. 2. Minimal chronic ischemic microvascular disease. Electronically Signed   By: Elberta Fortis M.D.   On: 01/12/2021 14:01   DG Chest Port 1 View  Result Date: 01/12/2021 CLINICAL DATA:  Altered mental status.  Wound of the RIGHT hip. EXAM: PORTABLE CHEST 1 VIEW COMPARISON:  02/26/2020 FINDINGS: Heart is enlarged, probably stable in comparison with prior study.  There are no focal consolidations or pleural effusions. No pulmonary edema. No pneumothorax or acute displaced fracture. Remote LEFT shoulder arthroplasty. Chronic changes in the RIGHT shoulder. IMPRESSION: Stable cardiomegaly.  No evidence for acute  abnormality. Electronically Signed   By: Norva Pavlov M.D.   On: 01/12/2021 14:30   DG Hip Unilat W or Wo Pelvis 2-3  Views Right  Result Date: 01/12/2021 CLINICAL DATA:  Hip ulcer/wound. Wound of the RIGHT hip. History of dementia. EXAM: DG HIP (WITH OR WITHOUT PELVIS) 2-3V RIGHT COMPARISON:  09/30/2020 FINDINGS: Status post bilateral total hip arthroplasty. Hardware appears well seated. There is no acute fracture or subluxation. There is irregularity of the soft tissue adjacent to the RIGHT greater trochanter, consistent with described ulcer/wound. Apart from this, no soft tissue gas. Visualized bowel gas pattern is nonobstructive. IMPRESSION: No acute fracture. Soft tissue irregularity adjacent to the RIGHT hip. Electronically Signed   By: Norva Pavlov M.D.   On: 01/12/2021 14:29     none   Subjective: pt more awake and engaging today.  No acute events reproted.  Pt denies pain or feeling sick. Daughter agreeable to d/c home today.  Has help of her sister to care for patient.   Discharge Exam: Vitals:   01/16/21 0300 01/16/21 0800  BP: 111/70 (!) 107/92  Pulse: 68 78  Resp: 15 16  Temp: 98.5 F (36.9 C) 99.2 F (37.3 C)  SpO2: 100% 100%   Vitals:   01/15/21 1930 01/15/21 2130 01/16/21 0300 01/16/21 0800  BP: (!) 151/92  111/70 (!) 107/92  Pulse: 65  68 78  Resp: 15  15 16   Temp: 98.6 F (37 C)  98.5 F (36.9 C) 99.2 F (37.3 C)  TempSrc: Oral  Axillary Oral  SpO2: 97% 99% 100% 100%  Weight:      Height:        General: Pt is alert, awake, not in acute distress Cardiovascular: RRR, S1/S2 +, no rubs, no gallops Respiratory: CTA bilaterally, no wheezing, no rhonchi Abdominal: Soft, NT, ND, bowel sounds + Extremities:  lateral hip wound stable, no edema, no cyanosis    The results of significant diagnostics from this hospitalization (including imaging, microbiology, ancillary and laboratory) are listed below for reference.     Microbiology: Recent Results (from the past 240 hour(s))  Blood Cultures (routine x 2)     Status: None (Preliminary result)   Collection Time: 01/12/21  1:05 PM   Specimen: BLOOD  Result Value Ref Range Status   Specimen Description   Final    BLOOD LEFT ANTECUBITAL Performed at [facility], [address] [place], [place] [address]    Special Requests   Final    BOTTLES DRAWN AEROBIC AND ANAEROBIC Blood Culture adequate volume Performed at [facility], [address], [place], [place] [address]    Culture   Final    NO GROWTH 3 DAYS Performed at [facility], [address] [place], [place] [address]    Report Status PENDING  Incomplete  Resp Panel by RT-PCR (Flu A&B, Covid) Nasopharyngeal Swab     Status: None   Collection Time: 01/12/21  1:05 PM   Specimen: Nasopharyngeal Swab; Nasopharyngeal(NP) swabs in vial transport medium  Result Value Ref Range Status   SARS Coronavirus 2 by RT PCR NEGATIVE NEGATIVE Final    Comment: (NOTE) SARS-CoV-2 target nucleic acids are NOT DETECTED.  The SARS-CoV-2 RNA is generally detectable in upper respiratory specimens during the acute phase of infection. The lowest concentration of SARS-CoV-2 viral copies this assay can detect is 138 copies/mL. A negative result does not preclude SARS-Cov-2 infection and should not be used as the sole basis for treatment or other patient management decisions. A negative result may occur with  improper specimen collection/handling, submission of specimen other than nasopharyngeal swab, presence of viral  mutation(s) within the areas targeted by this assay, and inadequate number of viral copies(<138 copies/mL). A negative result must be combined  with clinical observations, patient history, and epidemiological information. The expected result is Negative.  Fact Sheet for Patients:  BloggerCourse.com  Fact Sheet for Healthcare Providers:  SeriousBroker.it  This test is no t yet approved or cleared by the [place] FDA and  has been authorized for detection and/or diagnosis of SARS-CoV-2 by FDA under an Emergency Use Authorization (EUA). This EUA will remain  in effect (meaning this test can be used) for the duration of the COVID-19 declaration under Section 564(b)(1) of the Act, 21 U.S.C.section 360bbb-3(b)(1), unless the authorization is terminated  or revoked sooner.       Influenza A by PCR NEGATIVE NEGATIVE Final   Influenza B by PCR NEGATIVE NEGATIVE Final    Comment: (NOTE) The Xpert Xpress SARS-CoV-2/FLU/RSV plus assay is intended as an aid in the diagnosis of influenza from Nasopharyngeal swab specimens and should not be used as a sole basis for treatment. Nasal washings and aspirates are unacceptable for Xpert Xpress SARS-CoV-2/FLU/RSV testing.  Fact Sheet for Patients: BloggerCourse.com  Fact Sheet for Healthcare Providers: SeriousBroker.it  This test is not yet approved or cleared by the [place] FDA and has been authorized for detection and/or diagnosis of SARS-CoV-2 by FDA under an Emergency Use Authorization (EUA). This EUA will remain in effect (meaning this test can be used) for the duration of the COVID-19 declaration under Section 564(b)(1) of the Act, 21 U.S.C. section 360bbb-3(b)(1), unless the authorization is terminated or revoked.  Performed at [facility], [address], [place], [place] [address]   Blood Cultures (routine x 2)     Status: None (Preliminary result)   Collection Time: 01/12/21  1:08 PM   Specimen: BLOOD  Result Value Ref Range Status   Specimen  Description   Final    BLOOD LEFT ANTECUBITAL Performed at [facility], [address] [place], [place] [address]    Special Requests   Final    BOTTLES DRAWN AEROBIC AND ANAEROBIC Blood Culture adequate volume Performed at [facility], [address] [place], [place] [address]    Culture   Final    NO GROWTH 3 DAYS Performed at [facility], [address], [place], [place] [address]    Report Status PENDING  Incomplete  Urine Culture     Status: Abnormal   Collection Time: 01/12/21  2:45 PM   Specimen: Urine, Catheterized  Result Value Ref Range Status   Specimen Description   Final    URINE, CATHETERIZED Performed at [facility], [address] [place], [place] [address]    Special Requests   Final    NONE Performed at [facility], [address] [place], [place] [address]    Culture MULTIPLE SPECIES PRESENT, SUGGEST RECOLLECTION (A)  Final   Report Status 01/13/2021 FINAL  Final     Labs: BNP (last 3 results) No results for input(s): BNP in the last 8760 hours. Basic Metabolic Panel: Recent Labs  Lab 01/12/21 1305 01/13/21 0435 01/14/21 0415  NA 138 138 140  K 4.0 4.1 4.8  CL 104 105 106  CO2 25 27 28   GLUCOSE 89 87 93  BUN 25* 22 16  CREATININE 0.88 0.85 0.80  CALCIUM 9.8 9.4 9.8   Liver Function Tests: Recent Labs  Lab 01/12/21 1305 01/13/21 0435  AST 27  31  ALT 14 17  ALKPHOS 70 59  BILITOT 0.8 1.0  PROT 8.5* 7.5  ALBUMIN 4.5 3.8   No results for input(s): LIPASE, AMYLASE in the last 168 hours. No results for input(s): AMMONIA in the last 168 hours. CBC: Recent Labs  Lab 01/12/21 1305 01/13/21 0435  WBC 5.0 4.9  NEUTROABS 2.9  --   HGB 12.4 11.4*  HCT 39.7 36.4  MCV 96.6 95.5  PLT 273 252   Cardiac Enzymes: No results for input(s): CKTOTAL, CKMB, CKMBINDEX, TROPONINI in the last 168 hours. BNP: Invalid input(s): POCBNP CBG: Recent Labs  Lab  01/12/21 1318  GLUCAP 72   D-Dimer No results for input(s): DDIMER in the last 72 hours. Hgb A1c No results for input(s): HGBA1C in the last 72 hours. Lipid Profile No results for input(s): CHOL, HDL, LDLCALC, TRIG, CHOLHDL, LDLDIRECT in the last 72 hours. Thyroid function studies No results for input(s): TSH, T4TOTAL, T3FREE, THYROIDAB in the last 72 hours.  Invalid input(s): FREET3 Anemia work up No results for input(s): VITAMINB12, FOLATE, FERRITIN, TIBC, IRON, RETICCTPCT in the last 72 hours. Urinalysis    Component Value Date/Time   COLORURINE YELLOW 01/12/2021 1445   APPEARANCEUR HAZY (A) 01/12/2021 1445   LABSPEC 1.015 01/12/2021 1445   PHURINE 6.0 01/12/2021 1445   GLUCOSEU NEGATIVE 01/12/2021 1445   HGBUR SMALL (A) 01/12/2021 1445   BILIRUBINUR NEGATIVE 01/12/2021 1445   BILIRUBINUR negative 08/11/2017 1420   BILIRUBINUR neg 01/11/2014 0939   KETONESUR 5 (A) 01/12/2021 1445   PROTEINUR NEGATIVE 01/12/2021 1445   UROBILINOGEN 0.2 08/11/2017 1420   UROBILINOGEN 0.2 01/11/2012 1027   NITRITE POSITIVE (A) 01/12/2021 1445   LEUKOCYTESUR LARGE (A) 01/12/2021 1445   Sepsis Labs Invalid input(s): PROCALCITONIN,  WBC,  LACTICIDVEN Microbiology Recent Results (from the past 240 hour(s))  Blood Cultures (routine x 2)     Status: None (Preliminary result)   Collection Time: 01/12/21  1:05 PM   Specimen: BLOOD  Result Value Ref Range Status   Specimen Description   Final    BLOOD LEFT ANTECUBITAL Performed at [facility], [address] [place], [place] [address]    Special Requests   Final    BOTTLES DRAWN AEROBIC AND ANAEROBIC Blood Culture adequate volume Performed at [facility], [address] [place], [place] [address]    Culture   Final    NO GROWTH 3 DAYS Performed at [facility], [address], [place], [place] [address]    Report Status PENDING  Incomplete  Resp Panel by RT-PCR (Flu A&B, Covid)  Nasopharyngeal Swab     Status: None   Collection Time: 01/12/21  1:05 PM   Specimen: Nasopharyngeal Swab; Nasopharyngeal(NP) swabs in vial transport medium  Result Value Ref Range Status   SARS Coronavirus 2 by RT PCR NEGATIVE NEGATIVE Final    Comment: (NOTE) SARS-CoV-2 target nucleic acids are NOT DETECTED.  The SARS-CoV-2 RNA is generally detectable in upper respiratory specimens during the acute phase of infection. The lowest concentration of SARS-CoV-2 viral copies this assay can detect is 138 copies/mL. A negative result does not preclude SARS-Cov-2 infection and should not be used as the sole basis for treatment or other patient management decisions. A negative result may occur with  improper specimen collection/handling, submission of specimen other than nasopharyngeal swab, presence of viral mutation(s) within the areas targeted by this assay, and inadequate number of viral copies(<138 copies/mL). A negative result must be combined with  clinical observations, patient history, and epidemiological information. The expected result is Negative.  Fact Sheet for Patients:  BloggerCourse.com  Fact Sheet for Healthcare Providers:  SeriousBroker.it  This test is no t yet approved or cleared by the [place] FDA and  has been authorized for detection and/or diagnosis of SARS-CoV-2 by FDA under an Emergency Use Authorization (EUA). This EUA will remain  in effect (meaning this test can be used) for the duration of the COVID-19 declaration under Section 564(b)(1) of the Act, 21 U.S.C.section 360bbb-3(b)(1), unless the authorization is terminated  or revoked sooner.       Influenza A by PCR NEGATIVE NEGATIVE Final   Influenza B by PCR NEGATIVE NEGATIVE Final    Comment: (NOTE) The Xpert Xpress SARS-CoV-2/FLU/RSV plus assay is intended as an aid in the diagnosis of influenza from Nasopharyngeal swab specimens and should not be  used as a sole basis for treatment. Nasal washings and aspirates are unacceptable for Xpert Xpress SARS-CoV-2/FLU/RSV testing.  Fact Sheet for Patients: BloggerCourse.com  Fact Sheet for Healthcare Providers: SeriousBroker.it  This test is not yet approved or cleared by the [place] FDA and has been authorized for detection and/or diagnosis of SARS-CoV-2 by FDA under an Emergency Use Authorization (EUA). This EUA will remain in effect (meaning this test can be used) for the duration of the COVID-19 declaration under Section 564(b)(1) of the Act, 21 U.S.C. section 360bbb-3(b)(1), unless the authorization is terminated or revoked.  Performed at [facility], [address], [place], [place] [address]   Blood Cultures (routine x 2)     Status: None (Preliminary result)   Collection Time: 01/12/21  1:08 PM   Specimen: BLOOD  Result Value Ref Range Status   Specimen Description   Final    BLOOD LEFT ANTECUBITAL Performed at [facility], [address] [place], [place] [address]    Special Requests   Final    BOTTLES DRAWN AEROBIC AND ANAEROBIC Blood Culture adequate volume Performed at [facility], [address], [place], [place] [address]    Culture   Final    NO GROWTH 3 DAYS Performed at [facility], [address] [place], [place] [address]    Report Status PENDING  Incomplete  Urine Culture     Status: Abnormal   Collection Time: 01/12/21  2:45 PM   Specimen: Urine, Catheterized  Result Value Ref Range Status   Specimen Description   Final    URINE, CATHETERIZED Performed at [facility], [address] [place], [place] [address]    Special Requests   Final    NONE Performed at [facility], [address], [place], [place] [address]    Culture MULTIPLE SPECIES PRESENT, SUGGEST RECOLLECTION (A)  Final    Report Status 01/13/2021 FINAL  Final     Time coordinating discharge: Over 30 minutes  SIGNED:   Pennie Banter, DO [facility] 01/16/2021, 9:44 AM   If 7PM-7AM, please contact night-coverage [email or web address]

## 2021-01-16 NOTE — Progress Notes (Signed)
PROGRESS NOTE    [name]   VVO:[id number]  DOB: April 13, 1941  PCP: [name], NP    DOA: 01/12/2021 LOS: 3    Brief Narrative / Hospital Course to Date:   Patient is a 80 year old female with history of chronic constipation, prolapsed bladder, dementia presented with altered mental status.  History from the daughter at the bedside reported that for the last 2 days prior to admission, patient had not been acting herself.  She was having constipation and was receiving laxative.  A day before the admission, she complained of nonspecific stomach pain, felt weak all day and had lack of appetite.  On the morning of admission, patient seemed to be more confused and having more difficulty ambulating and hence was brought to ED for further work-up UA positive for UTI.  Assessment & Plan   Principal Problem:   Acute metabolic encephalopathy Active Problems:   Essential hypertension   Hypotension   Failure to thrive in adult   Late onset Alzheimer's dementia with behavioral disturbance (HCC)   UTI (urinary tract infection)   Constipation   Acute metabolic encephalopathy superimposed on baseline dementia -head CT negative for any acute findings.  UA consistent with UTI.  Acute mental status changes most likely secondary to infection.  Mental status is improved but not quite at baseline today.  Initial urine culture grew multiple species. -- Follow-up repeat urine culture --Continue empiric Rocephin, likely discharge on Omnicef to complete 7 to 10-day course given complicated by bladder prolapse --Continue home Remeron --PT recommending SNF, patient's daughter prefers home with home health PT to save SNF days for postop after bladder prolapse surgery --Delirium precautions  Bladder prolapse - chronic, increased risk of recurrent UTI's. --will get repeat urine culture --daughter reports Gyn surgery is planned in near future  Chronic hypotension -on midodrine PTA which has been  decreased due to elevated BPs here.  Monitor blood pressure.  Failure to thrive in adult -likely due to progressive dementia.  Patient goes to adult daycare.  Has had fairly poor p.o. intake per daughter.  Encourage oral intake and mobility is much as tolerated.  Constipation, chronic - Continue bowel regimen per orders  Chronic right hip wound -WOC consulted, continue wound care per their recommendations.  Patient follows at wound care center  Patient BMI: Body mass index is 26.01 kg/m.   DVT prophylaxis: enoxaparin (LOVENOX) injection 40 mg Start: 01/12/21 2200   Diet:  Diet Orders (From admission, onward)     Start     Ordered   01/16/21 0000  Diet - low sodium heart healthy        01/16/21 0944   01/12/21 1649  Diet Heart Room service appropriate? Yes; Fluid consistency: Thin  Diet effective now       Question Answer Comment  Room service appropriate? Yes   Fluid consistency: Thin      01/12/21 1649              Code Status: Full Code   Subjective 01/16/21    Patient somnolent when seen on rounds this morning.  She does arouse to loud voice and tactile stimulus, only briefly however.  Appears comfortable and in no distress.  No acute events reported.  Per bedside RN patient did wake up for medications.  Patient's daughters concerned about discharge today given patient's profound weakness.  Her other daughter is traveling here from [place] to help her sister with taking care of patient at home.  Disposition Plan & Communication   Status is: Inpatient  Remains inpatient appropriate because: Unsafe discharge until patient's other daughter arrives from out of state to assist with her care at home.  Daughter patient lives with lacks physical capacity to safely care for the patient.  Dispo: The patient is from: Home              Anticipated d/c is to: Home              Patient currently is medically stable for discharge   Difficult to place patient No   Family  Communication: Daughter at bedside today, 10/6, with other daughter also updated by phone this afternoon on room [place] visit to patient's room regarding discharge plans.   Consults, Procedures, Significant Events   Consultants:  None  Procedures:  Wound care  Antimicrobials:  Anti-infectives (From admission, onward)    Start     Dose/Rate Route Frequency Ordered Stop   01/16/21 0000  cefdinir (OMNICEF) 300 MG capsule        300 mg Oral 2 times daily 01/16/21 0944 01/19/21 2359   01/13/21 1600  cefTRIAXone (ROCEPHIN) 1 g in sodium chloride 0.9 % 100 mL IVPB        1 g 200 mL/hr over 30 Minutes Intravenous Every 24 hours 01/12/21 1904     01/13/21 1000  cefTRIAXone (ROCEPHIN) 1 g in sodium chloride 0.9 % 100 mL IVPB  Status:  Discontinued        1 g 200 mL/hr over 30 Minutes Intravenous Every 24 hours 01/12/21 1649 01/12/21 1904   01/12/21 1530  cefTRIAXone (ROCEPHIN) 1 g in sodium chloride 0.9 % 100 mL IVPB        1 g 200 mL/hr over 30 Minutes Intravenous  Once 01/12/21 1517 01/12/21 1554         Micro    Objective   Vitals:   01/15/21 2130 01/16/21 0300 01/16/21 0800 01/16/21 1300  BP:  111/70 (!) 107/92 118/76  Pulse:  68 78 69  Resp:  15 16 18   Temp:  98.5 F (36.9 C) 99.2 F (37.3 C) 99.6 F (37.6 C)  TempSrc:  Axillary Oral Oral  SpO2: 99% 100% 100% 100%  Weight:      Height:        Intake/Output Summary (Last 24 hours) at 01/16/2021 1913 Last data filed at 01/15/2021 2000 Gross per 24 hour  Intake 120 ml  Output --  Net 120 ml   Filed Weights   01/12/21 1254 01/13/21 1700  Weight: 69 kg 64.5 kg    Physical Exam:  General exam: Somnolent, arouses to loud voice and tactile stimulus, no acute distress Respiratory system: Lungs clear bilaterally, no wheezes rales or rhonchi, normal respiratory effort on room air. Cardiovascular system: Regular rate and rhythm, no peripheral edema.   Gastrointestinal system: Abdomen soft and nontender with present  bowel sounds. Central nervous system: Unable to evaluate due to patient somnolence   Labs   Data Reviewed: I have personally reviewed following labs and imaging studies  CBC: Recent Labs  Lab 01/12/21 1305 01/13/21 0435  WBC 5.0 4.9  NEUTROABS 2.9  --   HGB 12.4 11.4*  HCT 39.7 36.4  MCV 96.6 95.5  PLT 273 252   Basic Metabolic Panel: Recent Labs  Lab 01/12/21 1305 01/13/21 0435 01/14/21 0415  NA 138 138 140  K 4.0 4.1 4.8  CL 104 105 106  CO2 25 27 28   GLUCOSE 89 87  93  BUN 25* 22 16  CREATININE 0.88 0.85 0.80  CALCIUM 9.8 9.4 9.8   GFR: Estimated Creatinine Clearance: 51.1 mL/min (by C-G formula based on SCr of 0.8 mg/dL). Liver Function Tests: Recent Labs  Lab 01/12/21 1305 01/13/21 0435  AST 27 31  ALT 14 17  ALKPHOS 70 59  BILITOT 0.8 1.0  PROT 8.5* 7.5  ALBUMIN 4.5 3.8   No results for input(s): LIPASE, AMYLASE in the last 168 hours. No results for input(s): AMMONIA in the last 168 hours. Coagulation Profile: Recent Labs  Lab 01/12/21 1305  INR 1.1   Cardiac Enzymes: No results for input(s): CKTOTAL, CKMB, CKMBINDEX, TROPONINI in the last 168 hours. BNP (last 3 results) No results for input(s): PROBNP in the last 8760 hours. HbA1C: No results for input(s): HGBA1C in the last 72 hours. CBG: Recent Labs  Lab 01/12/21 1318  GLUCAP 72   Lipid Profile: No results for input(s): CHOL, HDL, LDLCALC, TRIG, CHOLHDL, LDLDIRECT in the last 72 hours. Thyroid Function Tests: No results for input(s): TSH, T4TOTAL, FREET4, T3FREE, THYROIDAB in the last 72 hours. Anemia Panel: No results for input(s): VITAMINB12, FOLATE, FERRITIN, TIBC, IRON, RETICCTPCT in the last 72 hours. Sepsis Labs: Recent Labs  Lab 01/12/21 1305  LATICACIDVEN 1.2    Recent Results (from the past 240 hour(s))  Blood Cultures (routine x 2)     Status: None (Preliminary result)   Collection Time: 01/12/21  1:05 PM   Specimen: BLOOD  Result Value Ref Range Status   Specimen  Description   Final    BLOOD LEFT ANTECUBITAL Performed at [facility], [address], [place], [place] [address]    Special Requests   Final    BOTTLES DRAWN AEROBIC AND ANAEROBIC Blood Culture adequate volume Performed at [facility], [address] [place], [place] [address]    Culture   Final    NO GROWTH 4 DAYS Performed at [facility], [address], [place], [place] [address]    Report Status PENDING  Incomplete  Resp Panel by RT-PCR (Flu A&B, Covid) Nasopharyngeal Swab     Status: None   Collection Time: 01/12/21  1:05 PM   Specimen: Nasopharyngeal Swab; Nasopharyngeal(NP) swabs in vial transport medium  Result Value Ref Range Status   SARS Coronavirus 2 by RT PCR NEGATIVE NEGATIVE Final    Comment: (NOTE) SARS-CoV-2 target nucleic acids are NOT DETECTED.  The SARS-CoV-2 RNA is generally detectable in upper respiratory specimens during the acute phase of infection. The lowest concentration of SARS-CoV-2 viral copies this assay can detect is 138 copies/mL. A negative result does not preclude SARS-Cov-2 infection and should not be used as the sole basis for treatment or other patient management decisions. A negative result may occur with  improper specimen collection/handling, submission of specimen other than nasopharyngeal swab, presence of viral mutation(s) within the areas targeted by this assay, and inadequate number of viral copies(<138 copies/mL). A negative result must be combined with clinical observations, patient history, and epidemiological information. The expected result is Negative.  Fact Sheet for Patients:  BloggerCourse.com  Fact Sheet for Healthcare Providers:  SeriousBroker.it  This test is no t yet approved or cleared by the [place] FDA and  has been authorized for detection and/or diagnosis of SARS-CoV-2 by FDA under an Emergency Use  Authorization (EUA). This EUA will remain  in effect (meaning this test can be used) for the duration of the COVID-19 declaration under Section  564(b)(1) of the Act, 21 U.S.C.section 360bbb-3(b)(1), unless the authorization is terminated  or revoked sooner.       Influenza A by PCR NEGATIVE NEGATIVE Final   Influenza B by PCR NEGATIVE NEGATIVE Final    Comment: (NOTE) The Xpert Xpress SARS-CoV-2/FLU/RSV plus assay is intended as an aid in the diagnosis of influenza from Nasopharyngeal swab specimens and should not be used as a sole basis for treatment. Nasal washings and aspirates are unacceptable for Xpert Xpress SARS-CoV-2/FLU/RSV testing.  Fact Sheet for Patients: BloggerCourse.com  Fact Sheet for Healthcare Providers: SeriousBroker.it  This test is not yet approved or cleared by the [place] FDA and has been authorized for detection and/or diagnosis of SARS-CoV-2 by FDA under an Emergency Use Authorization (EUA). This EUA will remain in effect (meaning this test can be used) for the duration of the COVID-19 declaration under Section 564(b)(1) of the Act, 21 U.S.C. section 360bbb-3(b)(1), unless the authorization is terminated or revoked.  Performed at [facility], [address] [place], [place] [address]   Blood Cultures (routine x 2)     Status: None (Preliminary result)   Collection Time: 01/12/21  1:08 PM   Specimen: BLOOD  Result Value Ref Range Status   Specimen Description   Final    BLOOD LEFT ANTECUBITAL Performed at [facility], [address], [place], [place] [address]    Special Requests   Final    BOTTLES DRAWN AEROBIC AND ANAEROBIC Blood Culture adequate volume Performed at [facility], [address] [place], [place] [address]    Culture   Final    NO GROWTH 4 DAYS Performed at [facility], [address], [place], [place]  [address]    Report Status PENDING  Incomplete  Urine Culture     Status: Abnormal   Collection Time: 01/12/21  2:45 PM   Specimen: Urine, Catheterized  Result Value Ref Range Status   Specimen Description   Final    URINE, CATHETERIZED Performed at [facility], [address] [place], [place] [address]    Special Requests   Final    NONE Performed at [facility], [address] [place], [place] [address]    Culture MULTIPLE SPECIES PRESENT, SUGGEST RECOLLECTION (A)  Final   Report Status 01/13/2021 FINAL  Final      Imaging Studies   No results found.   Medications   Scheduled Meds:  aspirin EC  81 mg Oral Daily   enoxaparin (LOVENOX) injection  40 mg Subcutaneous Q24H   midodrine  5 mg Oral BID WC   mirtazapine  15 mg Oral QHS   polyethylene glycol  17 g Oral BID WC   senna-docusate  2 tablet Oral Daily   Continuous Infusions:  cefTRIAXone (ROCEPHIN)  IV 1 g (01/16/21 1707)       LOS: 3 days    Time spent: 40 minutes with > 50% spent at bedside and in coordination of care     Pennie Banter, DO [facility]  01/16/2021, 7:13 PM      If 7PM-7AM, please contact night-coverage. How to contact the [facility] Attending or Consulting provider 7A - 7P or covering provider during after hours 7P -7A, for this patient?    Check the care team in [facility] and look for a) attending/consulting TRH provider listed and b) the [facility] [place] [facility] team listed Log into [email or web address] and use [place]'s universal password to access. If  you do not have the password, please contact the hospital operator. Locate the [facility] provider you are looking for under [facility] and page to a number that you can be directly reached. If you still have difficulty reaching the provider, please page the [facility] (Director on Call) for the Hospitalists listed on amion for assistance.

## 2021-01-17 LAB — CULTURE, BLOOD (ROUTINE X 2)
Culture: NO GROWTH
Culture: NO GROWTH
Special Requests: ADEQUATE
Special Requests: ADEQUATE

## 2021-01-17 MED ORDER — CEFDINIR 300 MG PO CAPS: 300.0000 mg | ORAL_CAPSULE | Freq: Two times a day (BID) | ORAL | 0 refills | Status: AC

## 2021-01-17 NOTE — Progress Notes (Signed)
1242 PTAR came to pick up pt, daughter is not at the bedside, and said there would be no one to receive her mom at home when called. PTAR sent back. 1330 called daughter again, informed her that pt needs to be d/c home daughter stated she will be ready by 2pm. 1400 PTAR called. Currently awaiting pt to be picked up by [facility]

## 2021-01-17 NOTE — TOC Transition Note (Signed)
Transition of Care [facility]) - CM/SW Discharge Note   Patient Details  Name: [name] MRN: [id number] Date of Birth: [birth year]  Transition of Care [facility]) CM/SW Contact:  Ida Rogue, LCSW Phone Number: 01/17/2021, 10:21 AM   Clinical Narrative:   Patient who is stable for d/c will return home via PTAR.  Family alerted.  PTAR arranged.  No further needs identified. TOC sign off.    Final next level of care: Home w Home Health Services Barriers to Discharge: Barriers Resolved   Patient Goals and CMS Choice     Choice offered to / list presented to : Adult Children  Discharge Placement                       Discharge Plan and Services   Discharge Planning Services: CM Consult Post Acute Care Choice: Skilled Nursing Facility                               Social Determinants of Health (SDOH) Interventions     Readmission Risk Interventions No flowsheet data found.

## 2021-01-18 DIAGNOSIS — K5909 Other constipation: Secondary | ICD-10-CM

## 2021-01-18 DIAGNOSIS — F0390 Unspecified dementia without behavioral disturbance: Secondary | ICD-10-CM | POA: Diagnosis not present

## 2021-01-18 DIAGNOSIS — F419 Anxiety disorder, unspecified: Secondary | ICD-10-CM

## 2021-01-18 DIAGNOSIS — E785 Hyperlipidemia, unspecified: Secondary | ICD-10-CM

## 2021-01-18 DIAGNOSIS — I119 Hypertensive heart disease without heart failure: Secondary | ICD-10-CM | POA: Diagnosis not present

## 2021-01-18 DIAGNOSIS — F32A Depression, unspecified: Secondary | ICD-10-CM

## 2021-01-18 DIAGNOSIS — N390 Urinary tract infection, site not specified: Secondary | ICD-10-CM | POA: Diagnosis not present

## 2021-01-18 DIAGNOSIS — L89212 Pressure ulcer of right hip, stage 2: Secondary | ICD-10-CM | POA: Diagnosis not present

## 2021-01-18 LAB — URINE CULTURE: Culture: 50000 — AB

## 2021-01-21 ENCOUNTER — Telehealth: Payer: Self-pay | Admitting: *Deleted

## 2021-01-21 NOTE — Telephone Encounter (Signed)
Transition Care Management Follow-up Telephone Call Date of discharge and from where: 01/16/2021 [name] How have you been since you were released from the hospital? Getting better every day Any questions or concerns? Yes No Mobility at the current time. PT/OT is working on strengthening and getting her back to walking.   Items Reviewed: Did the pt receive and understand the discharge instructions provided? Yes  Medications obtained and verified? Yes  Other? No  Any new allergies since your discharge? No  Dietary orders reviewed? Yes Do you have support at home? Yes   Home Care and Equipment/Supplies: Were home health services ordered? yes If so, what is the name of the agency? [facility]  Has the agency set up a time to come to the patient's home? yes Were any new equipment or medical supplies ordered?  No What is the name of the medical supply agency? na Were you able to get the supplies/equipment? not applicable Do you have any questions related to the use of the equipment or supplies? No  Functional Questionnaire: (I = Independent and D = Dependent) ADLs: D at the moment  Bathing/Dressing- D  Meal Prep- D  Eating- I  Maintaining continence- I  Transferring/Ambulation- D  Managing Meds- D  Follow up appointments reviewed:  PCP Hospital f/u appt confirmed? Yes  Spoke with [name], daughter and she stated that patient is not mobile at the moment and hard to get into a car. Stated that she will call us back the middle of next week and schedule an appointment.  Specialist Hospital f/u appt confirmed? Yes  Scheduled to see GYN Dr. 01/29/2021 Are transportation arrangements needed? No  If their condition worsens, is the pt aware to call PCP or go to the Emergency Dept.? Yes Was the patient provided with contact information for the PCP's office or ED? Yes Was to pt encouraged to call back with questions or concerns? Yes

## 2021-01-30 ENCOUNTER — Other Ambulatory Visit: Payer: Self-pay

## 2021-01-30 ENCOUNTER — Telehealth: Payer: MEDICARE | Admitting: Orthopedic Surgery

## 2021-01-30 ENCOUNTER — Telehealth: Payer: Self-pay | Admitting: *Deleted

## 2021-01-30 ENCOUNTER — Encounter: Payer: Self-pay | Admitting: Orthopedic Surgery

## 2021-01-30 ENCOUNTER — Telehealth: Payer: Self-pay

## 2021-01-30 ENCOUNTER — Encounter: Payer: MEDICARE | Admitting: Orthopedic Surgery

## 2021-01-30 NOTE — Telephone Encounter (Signed)
Ms. [name], [name] are scheduled for a virtual visit with your provider today.    Just as we do with appointments in the office, we must obtain your consent to participate.  Your consent will be active for this visit and any virtual visit you may have with one of our providers in the next 365 days.    If you have a MyChart account, I can also send a copy of this consent to you electronically.  All virtual visits are billed to your insurance company just like a traditional visit in the office.  As this is a virtual visit, video technology does not allow for your provider to perform a traditional examination.  This may limit your provider's ability to fully assess your condition.  If your provider identifies any concerns that need to be evaluated in person or the need to arrange testing such as labs, EKG, etc, we will make arrangements to do so.    Although advances in technology are sophisticated, we cannot ensure that it will always work on either your end or our end.  If the connection with a video visit is poor, we may have to switch to a telephone visit.  With either a video or telephone visit, we are not always able to ensure that we have a secure connection.   I need to obtain your verbal consent now.   Are you willing to proceed with your visit today?   [name] has provided verbal consent on 01/30/2021 for a virtual visit (video or telephone).   Edison Simon [place], [place] 01/30/2021  3:00 pm

## 2021-01-30 NOTE — Progress Notes (Signed)
   This service is provided via telemedicine  No vital signs collected/recorded due to the encounter was a telemedicine visit.   Location of patient (ex: home, work):  Home  Patient consents to a telephone visit: Yes, see telephone visit dated 01/30/21  Location of the provider (ex: office, home):  [facility], Office   Name of any referring provider:  N/A  Names of all persons participating in the telemedicine service and their role in the encounter:  S.Chrae B/CMA, [name], NP, daughter Carroll Sage) and Patient   Time spent on call:  8 min with medical assistant

## 2021-01-30 NOTE — Progress Notes (Deleted)
Careteam: Patient Care Team: [name], NP as PCP - General (Adult Health Nurse Practitioner) [name], MD as PCP - Cardiology (Cardiology) [name], MD as Consulting Physician (Orthopedic Surgery) [name], MD as Consulting Physician (Urology) [name], MD as Consulting Physician (Neurology)  Seen by: [name], AGNP-C  PLACE OF SERVICE:  [facility]  Advanced Directive information Does Patient Have a Medical Advance Directive?: Yes, Type of Advance Directive: Healthcare Power of Attorney, Does patient want to make changes to medical advance directive?: No - Patient declined  Allergies  Allergen Reactions  . Effexor [Venlafaxine Hcl] Swelling and Other (See Comments)    MD ADVISES PATIENT TO NOT TAKE IN FUTURE PT STOPPED, MD DC'd MED  . Latex Rash  . Sulfa Drugs Cross Reactors Itching    Chief Complaint  Patient presents with  . Follow-up    Face-to-Face for hospital bed. Moderate fall risk.   . Error     HPI: Patient is a 80 y.o. female seen via video visit to f/u s/p hospitalization 10/02- 10/07.   Review of Systems:  ROS***  Past Medical History:  Diagnosis Date  . Anemia    on iron  . Anxiety   . Arthritis    shoulders, knees, hands, hips; status post left knee surgery  . Chronic bronchitis (HCC)    CXR 07/08/16  . Dementia (HCC)   . Depression   . GERD (gastroesophageal reflux disease)    TAKES TUMS  . H/O: pneumonia 2003  . Hyperlipidemia    on med  . Hypertension   . Morbid obesity with BMI of 45.0-49.9, adult (HCC)   . Shingles    recent 02/2017  . Thoracic aortic atherosclerosis (HCC)    CXR 07/08/16   Past Surgical History:  Procedure Laterality Date  . CARDIAC CATHETERIZATION  04/20/1996   patent coronaries, EF 74% (Dr. Aram Candela)  . CARPAL TUNNEL RELEASE Left   . CATARACT EXTRACTION, BILATERAL    . COLONOSCOPY    . KNEE SURGERY Left 11/2001  . NM MYOVIEW LTD  06/17/2018   EF 60-65 %.  Apical thinning.   LOW RISK no ischemia or infarction.  . REPLACEMENT TOTAL KNEE Left 10/2009  . REPLACEMENT TOTAL KNEE Left 10/2009  . SHOULDER OPEN ROTATOR CUFF REPAIR  03/01/2012   Procedure: ROTATOR CUFF REPAIR SHOULDER OPEN;  Surgeon: Mable Paris, MD;  Location: [facility] OR;  Service: Orthopedics;  Laterality: Left;  SUBSCAPULARIS REPAIR  . SVD     x 7  . TOTAL HIP ARTHROPLASTY Right 07/24/2016  . TOTAL HIP ARTHROPLASTY Right 07/24/2016   Procedure: RIGHT TOTAL HIP ARTHROPLASTY ANTERIOR APPROACH;  Surgeon: [name], MD;  Location: [facility] OR;  Service: Orthopedics;  Laterality: Right;  . TOTAL HIP ARTHROPLASTY Left 04/09/2017   Procedure: LEFT TOTAL HIP ARTHROPLASTY ANTERIOR APPROACH;  Surgeon: [name], MD;  Location: [facility] ORS;  Service: Orthopedics;  Laterality: Left;  . TOTAL SHOULDER ARTHROPLASTY  01/18/2012   Procedure: TOTAL SHOULDER ARTHROPLASTY;  Surgeon: Mable Paris, MD;  Location: [facility] OR;  Service: Orthopedics;  Laterality: Left;  . TOTAL SHOULDER REPLACEMENT Left 01/19/2012  . TRANSTHORACIC ECHOCARDIOGRAM  06/16/2018   EF 60 to 65%.  GR 1 DD.  Normal mitral valve.  Mild aortic sclerosis but no stenosis.  . TUBAL LIGATION    . WISDOM TOOTH EXTRACTION     Social History:   reports that she quit smoking about 36 years ago. Her smoking use included cigarettes.  She has a 25.00 pack-year smoking history. She has never used smokeless tobacco. She reports that she does not drink alcohol and does not use drugs.  Family History  Problem Relation Age of Onset  . Heart disease Mother   . Heart disease Father   . Heart disease Sister   . Hypertension Sister   . Heart disease Sister   . Hypertension Sister   . Heart disease Sister   . Hypertension Sister     Medications: Patient's Medications  New Prescriptions   No medications on file  Previous Medications   ACETAMINOPHEN (TYLENOL) 500 MG TABLET    Take 500 mg by mouth every 6 (six) hours as needed for headache (painb).    ASPIRIN 81 MG EC TABLET    Chew 81 mg by mouth daily.   HYDROXYZINE (ATARAX/VISTARIL) 25 MG TABLET    Take 1 tablet (25 mg total) by mouth 3 (three) times daily as needed.   MIDODRINE (PROAMATINE) 10 MG TABLET    TAKE 1 TABLET BY MOUTH TWICE A DAY WITH MEALS   MIRTAZAPINE (REMERON) 15 MG TABLET    Take 1 tablet (15 mg total) by mouth at bedtime.   MULTIPLE VITAMINS-MINERALS (CENTRUM ADULTS PO)    Take 1 tablet by mouth daily.   NUTRITION SUPPLEMENT, JUVEN, (JUVEN) PACK    Take 1 packet by mouth every other day. Alternate with pedialyte   PEDIALYTE (PEDIALYTE) SOLN    Take 120 mLs by mouth every other day. Alternate with Juven   POLYETHYLENE GLYCOL POWDER (GLYCOLAX/MIRALAX) 17 GM/SCOOP POWDER    Mix 1 capful of powder in drink and take by mouth one to three times daily as needed for daily soft stools OTC   SENNOSIDES-DOCUSATE SODIUM (SENOKOT-S) 8.6-50 MG TABLET    Take 2 tablets by mouth daily.   SODIUM PHOSPHATE (FLEET) 7-19 GM/118ML ENEM    Place 1 enema rectally once as needed for severe constipation.   TRIMETHOPRIM (TRIMPEX) 100 MG TABLET    Take 1 tablet (100 mg total) by mouth daily.  Modified Medications   No medications on file  Discontinued Medications   No medications on file    Physical Exam:  There were no vitals filed for this visit. There is no height or weight on file to calculate BMI. Wt Readings from Last 3 Encounters:  01/13/21 142 lb 3.2 oz (64.5 kg)  11/14/20 151 lb (68.5 kg)  10/28/20 143 lb 9.6 oz (65.1 kg)    Physical Exam***  Labs reviewed: Basic Metabolic Panel: Recent Labs    02/26/20 1909 02/27/20 0316 02/28/20 0159 02/29/20 0103 01/12/21 1305 01/13/21 0435 01/14/21 0415  NA  --    < > 141   < > 138 138 140  K  --    < > 3.5   < > 4.0 4.1 4.8  CL  --    < > 114*   < > 104 105 106  CO2  --    < > 18*   < > 25 27 28   GLUCOSE  --    < > 88   < > 89 87 93  BUN  --    < > 20   < > 25* 22 16  CREATININE  --    < > 1.48*   < > 0.88 0.85 0.80  CALCIUM   --    < > 8.9   < > 9.8 9.4 9.8  MG  --   --  2.1  --   --   --   --  PHOS  --   --  2.1*  --   --   --   --   TSH 0.644  --   --   --  0.771  --   --    < > = values in this interval not displayed.    Liver Function Tests: Recent Labs    04/04/20 1553 11/14/20 1532 01/12/21 1305 01/13/21 0435  AST 23 20 27 31   ALT 8 9 14 17   ALKPHOS 67  --  70 59  BILITOT 0.3 0.3 0.8 1.0  PROT 7.2 7.2 8.5* 7.5  ALBUMIN 4.3  --  4.5 3.8    No results for input(s): LIPASE, AMYLASE in the last 8760 hours. No results for input(s): AMMONIA in the last 8760 hours. CBC: Recent Labs    06/17/20 1823 11/14/20 1532 01/12/21 1305 01/13/21 0435  WBC 4.7 5.1 5.0 4.9  NEUTROABS 2.5 2,402 2.9  --   HGB 11.1* 11.2* 12.4 11.4*  HCT 36.0 35.9 39.7 36.4  MCV 98.4 94.2 96.6 95.5  PLT 283 231 273 252    Lipid Panel: No results for input(s): CHOL, HDL, LDLCALC, TRIG, CHOLHDL, LDLDIRECT in the last 8760 hours. TSH: Recent Labs    02/26/20 1909 01/12/21 1305  TSH 0.644 0.771    A1C: Lab Results  Component Value Date   HGBA1C 5.2 11/14/2020     Assessment/Plan There are no diagnoses linked to this encounter.  Next appt: *** Diarra Ceja 03/14/21  [facility] [phone] This encounter was created in error - please disregard. This encounter was created in error - please disregard.

## 2021-01-30 NOTE — Telephone Encounter (Signed)
[name] with [facility] called requesting an orders for :  Social [facility] Bed with half bed rails.   Patient has a Lobbyist this afternoon with you for documentation.

## 2021-01-30 NOTE — Telephone Encounter (Signed)
Appointment rescheduled to tomorrow. She will be on Dinah schedule 01/31/2021

## 2021-01-31 ENCOUNTER — Telehealth: Payer: MEDICARE | Admitting: Family

## 2021-01-31 ENCOUNTER — Other Ambulatory Visit: Payer: Self-pay

## 2021-02-07 ENCOUNTER — Ambulatory Visit: Payer: MEDICARE | Admitting: Podiatry

## 2021-02-10 ENCOUNTER — Encounter (HOSPITAL_BASED_OUTPATIENT_CLINIC_OR_DEPARTMENT_OTHER): Payer: MEDICARE | Admitting: Internal Medicine

## 2021-02-11 ENCOUNTER — Telehealth (INDEPENDENT_AMBULATORY_CARE_PROVIDER_SITE_OTHER): Payer: MEDICARE | Admitting: Family

## 2021-02-11 ENCOUNTER — Encounter: Payer: Self-pay | Admitting: Family

## 2021-02-11 ENCOUNTER — Other Ambulatory Visit: Payer: Self-pay

## 2021-02-11 DIAGNOSIS — L8946 Pressure-induced deep tissue damage of contiguous site of back, buttock and hip: Secondary | ICD-10-CM | POA: Diagnosis not present

## 2021-02-11 DIAGNOSIS — R5381 Other malaise: Secondary | ICD-10-CM

## 2021-02-11 DIAGNOSIS — G301 Alzheimer's disease with late onset: Secondary | ICD-10-CM | POA: Diagnosis not present

## 2021-02-11 DIAGNOSIS — F02818 Dementia in other diseases classified elsewhere, unspecified severity, with other behavioral disturbance: Secondary | ICD-10-CM | POA: Diagnosis not present

## 2021-02-11 NOTE — Progress Notes (Signed)
This service is provided via telemedicine  No vital signs collected/recorded due to the encounter was a telemedicine visit.   Location of patient (ex: home, work):  Home  Patient consents to a telephone visit:  Yes  Location of the provider (ex: office, home):  [employer].  Name of any referring provider:  .ppc   Names of all persons participating in the telemedicine service and their role in the encounter:  Patient, [name], RMA, [name], [name] Goodpasture, NP.    Time spent on call:  8 minutes spent on the phone with Medical Assistant.      Provider: Jaclyn Carew FNP-C  [name], NP  Patient Care Team: [name], NP as PCP - General (Adult Health Nurse Practitioner) [name], MD as PCP - Cardiology (Cardiology) [name], MD as Consulting Physician (Orthopedic Surgery) [name], MD as Consulting Physician (Urology) [name], MD as Consulting Physician (Neurology)  Extended Emergency Contact Information Primary Emergency Contact: [facility] Address: [address]          [place], [place] [address] Darden [place] of [place] Home Phone: [phone] Work Phone: [phone] Mobile Phone: [phone] Relation: Daughter Secondary Emergency Contact: Zaylei, Mullane          [facility] [place], [place] [address] Darden [place] of [place] Home Phone: [phone] Mobile Phone: [phone] Relation: Son  Code Status:   Goals of care: Advanced Directive information Advanced Directives 02/11/2021  Does Patient Have a Medical Advance Directive? Yes  Type of Advance Directive Healthcare Power of Attorney  Does patient want to make changes to medical advance directive? No - Patient declined  Copy of Healthcare Power of Attorney in Chart? Yes - validated most recent copy scanned in chart (See row information)  Would patient like information on creating a medical advance directive? -  Pre-existing out of facility DNR order (yellow form or pink  MOST form) -     Chief Complaint  Patient presents with   Follow-up    Patient is following up from 01/30/2021    HPI:  Pt is a 80 y.o. female seen today for an acute visit for evaluation of decline in condition.Patient daughter states since patient was discharged from the hospital 10//2022 her condition has declined.Has required total care assistance with her ADL's.Not walking anymore.Sleeps most of the day and lethargic.Legs are stiff.she does not eat much anymore.She drinks small amounts of water that family gives using a spoon.Not able to suck a straw like she used to prior to hospitalization for UTI.  Has pressure ulcer to right hip that she was following up with wound care but has not been able to go.  Has not had any bowel movement for the past 4 days.    Past Medical History:  Diagnosis Date   Anemia    on iron   Anxiety    Arthritis    shoulders, knees, hands, hips; status post left knee surgery   Chronic bronchitis (HCC)    CXR 07/08/16   Dementia (HCC)    Depression    GERD (gastroesophageal reflux disease)    TAKES TUMS   H/O: pneumonia 2003   Hyperlipidemia    on med   Hypertension    Morbid obesity with BMI of 45.0-49.9, adult (HCC)    Shingles    recent 02/2017   Thoracic aortic atherosclerosis (HCC)    CXR 07/08/16   Past Surgical History:  Procedure Laterality Date   CARDIAC CATHETERIZATION  04/20/1996   patent  coronaries, EF 74% (Dr. Aram Candela)   CARPAL TUNNEL RELEASE Left    CATARACT EXTRACTION, BILATERAL     COLONOSCOPY     KNEE SURGERY Left 11/2001   NM MYOVIEW LTD  06/17/2018   EF 60-65 %.  Apical thinning.  LOW RISK no ischemia or infarction.   REPLACEMENT TOTAL KNEE Left 10/2009   REPLACEMENT TOTAL KNEE Left 10/2009   SHOULDER OPEN ROTATOR CUFF REPAIR  03/01/2012   Procedure: ROTATOR CUFF REPAIR SHOULDER OPEN;  Surgeon: Mable Paris, MD;  Location: [facility] OR;  Service: Orthopedics;  Laterality: Left;  SUBSCAPULARIS REPAIR   SVD     x 7    TOTAL HIP ARTHROPLASTY Right 07/24/2016   TOTAL HIP ARTHROPLASTY Right 07/24/2016   Procedure: RIGHT TOTAL HIP ARTHROPLASTY ANTERIOR APPROACH;  Surgeon: [name], MD;  Location: [facility] OR;  Service: Orthopedics;  Laterality: Right;   TOTAL HIP ARTHROPLASTY Left 04/09/2017   Procedure: LEFT TOTAL HIP ARTHROPLASTY ANTERIOR APPROACH;  Surgeon: [name], MD;  Location: [facility] ORS;  Service: Orthopedics;  Laterality: Left;   TOTAL SHOULDER ARTHROPLASTY  01/18/2012   Procedure: TOTAL SHOULDER ARTHROPLASTY;  Surgeon: Mable Paris, MD;  Location: [facility] OR;  Service: Orthopedics;  Laterality: Left;   TOTAL SHOULDER REPLACEMENT Left 01/19/2012   TRANSTHORACIC ECHOCARDIOGRAM  06/16/2018   EF 60 to 65%.  GR 1 DD.  Normal mitral valve.  Mild aortic sclerosis but no stenosis.   TUBAL LIGATION     WISDOM TOOTH EXTRACTION      Allergies  Allergen Reactions   Effexor [Venlafaxine Hcl] Swelling and Other (See Comments)    MD ADVISES PATIENT TO NOT TAKE IN FUTURE PT STOPPED, MD DC'd MED   Latex Rash   Sulfa Drugs Cross Reactors Itching    Outpatient Encounter Medications as of 02/11/2021  Medication Sig   acetaminophen (TYLENOL) 500 MG tablet Take 500 mg by mouth every 6 (six) hours as needed for headache (painb).   aspirin 81 MG EC tablet Chew 81 mg by mouth daily.   hydrOXYzine (ATARAX/VISTARIL) 25 MG tablet Take 1 tablet (25 mg total) by mouth 3 (three) times daily as needed.   midodrine (PROAMATINE) 10 MG tablet TAKE 1 TABLET BY MOUTH TWICE A DAY WITH MEALS   mirtazapine (REMERON) 15 MG tablet Take 1 tablet (15 mg total) by mouth at bedtime.   Multiple Vitamins-Minerals (CENTRUM ADULTS PO) Take 1 tablet by mouth daily.   nutrition supplement, JUVEN, (JUVEN) PACK Take 1 packet by mouth every other day. Alternate with pedialyte   Pedialyte (PEDIALYTE) SOLN Take 120 mLs by mouth every other day. Alternate with Juven   polyethylene glycol powder (GLYCOLAX/MIRALAX) 17 GM/SCOOP powder Mix 1 capful of  powder in drink and take by mouth one to three times daily as needed for daily soft stools OTC   sennosides-docusate sodium (SENOKOT-S) 8.6-50 MG tablet Take 2 tablets by mouth daily.   sodium phosphate (FLEET) 7-19 GM/118ML ENEM Place 1 enema rectally once as needed for severe constipation.   trimethoprim (TRIMPEX) 100 MG tablet Take 1 tablet (100 mg total) by mouth daily.   No facility-administered encounter medications on file as of 02/11/2021.    Review of Systems  Constitutional:  Positive for appetite change and fatigue.  Respiratory:  Negative for cough, chest tightness, shortness of breath and wheezing.   Gastrointestinal:  Positive for constipation.  Genitourinary:        Incontinent   Musculoskeletal:  Positive for gait problem.  Stiff   Skin:  Positive for wound.       Right hip pressure ulcer   Neurological:        Generalized weakness   Psychiatric/Behavioral:  Negative for agitation and sleep disturbance. The patient is not nervous/anxious.    Immunization History  Administered Date(s) Administered   Fluad Quad(high Dose 65+) 02/21/2019   Influenza Split 12/06/2014, 07/10/2016, 01/20/2017   Influenza, High Dose Seasonal PF 01/08/2018, 02/21/2019, 03/03/2020   Influenza,inj,Quad PF,6+ Mos 12/06/2014, 07/10/2016, 01/20/2017   PFIZER(Purple Top)SARS-COV-2 Vaccination 06/09/2019, 07/05/2019, 02/01/2020   PPD Test 07/26/2016   Pneumococcal Conjugate-13 12/06/2014   Pneumococcal Polysaccharide-23 01/18/2009   Td 08/28/2015   Tdap 08/28/2015   Pertinent  Health Maintenance Due  Topic Date Due   DEXA SCAN  Never done   INFLUENZA VACCINE  11/11/2020   Fall Risk 01/16/2021 01/16/2021 01/17/2021 01/30/2021 02/11/2021  Falls in the past year? - - - 1 1  Was there an injury with Fall? - - - 0 0  Fall Risk Category Calculator - - - 2 2  Fall Risk Category - - - Moderate Moderate  Patient Fall Risk Level High fall risk High fall risk High fall risk Moderate fall risk  Moderate fall risk  Patient at Risk for Falls Due to - - - History of fall(s) History of fall(s)  Fall risk Follow up - - - Falls evaluation completed Falls evaluation completed;Education provided;Falls prevention discussed   Functional Status Survey:    There were no vitals filed for this visit. There is no height or weight on file to calculate BMI. Physical Exam Constitutional:      General: She is not in acute distress.    Appearance: She is not ill-appearing.  Pulmonary:     Effort: Pulmonary effort is normal. No respiratory distress.  Neurological:     Mental Status: She is alert.   Labs reviewed: Recent Labs    02/28/20 0159 02/29/20 0103 01/12/21 1305 01/13/21 0435 01/14/21 0415  NA 141   < > 138 138 140  K 3.5   < > 4.0 4.1 4.8  CL 114*   < > 104 105 106  CO2 18*   < > 25 27 28   GLUCOSE 88   < > 89 87 93  BUN 20   < > 25* 22 16  CREATININE 1.48*   < > 0.88 0.85 0.80  CALCIUM 8.9   < > 9.8 9.4 9.8  MG 2.1  --   --   --   --   PHOS 2.1*  --   --   --   --    < > = values in this interval not displayed.   Recent Labs    04/04/20 1553 11/14/20 1532 01/12/21 1305 01/13/21 0435  AST 23 20 27 31   ALT 8 9 14 17   ALKPHOS 67  --  70 59  BILITOT 0.3 0.3 0.8 1.0  PROT 7.2 7.2 8.5* 7.5  ALBUMIN 4.3  --  4.5 3.8   Recent Labs    06/17/20 1823 11/14/20 1532 01/12/21 1305 01/13/21 0435  WBC 4.7 5.1 5.0 4.9  NEUTROABS 2.5 2,402 2.9  --   HGB 11.1* 11.2* 12.4 11.4*  HCT 36.0 35.9 39.7 36.4  MCV 98.4 94.2 96.6 95.5  PLT 283 231 273 252   Lab Results  Component Value Date   TSH 0.771 01/12/2021   Lab Results  Component Value Date   HGBA1C 5.2 11/14/2020   Lab Results  Component Value Date   CHOL 181 02/21/2018   HDL 87 02/21/2018   LDLCALC 84 02/21/2018   TRIG 49 02/21/2018   CHOLHDL 2.1 02/21/2018    Significant Diagnostic Results in last 30 days:  Abd 1 View (KUB)  Result Date: 01/12/2021 CLINICAL DATA:  Bowel problems. EXAM: ABDOMEN - 1 VIEW  COMPARISON:  None. FINDINGS: The bowel gas pattern is normal. There is average stool burden. No radio-opaque calculi or other significant radiographic abnormality are seen. Bilateral hip arthroplasties are present. IMPRESSION: Negative. Electronically Signed   By: Darliss Cheney M.D.   On: 01/12/2021 17:52    Assessment/Plan 1. Physical deconditioning Has declined since recent hospitalization for UTI with poor oral intake and now dependent on all activity of daily living. POA states have talked with other family members all agree on patient's declining condition would like to place on Hospice service.request [facility] in [place].  - Ambulatory referral to Hospice  2. Late onset Alzheimer's dementia with behavioral disturbance (HCC) Progressive declined as above with poor oral intake.  - Ambulatory referral to Hospice  3. Pressure injury of deep tissue of contiguous region involving back and right hip No longer management by wound care center due to declined in condition bed bound. - daughter to continue with wound care. Hospice Nurse to manage wound. - Ambulatory referral to Hospice   Family/ staff Communication: Reviewed plan of care with patient's daughter  Labs/tests ordered: None   Next Appointment: As needed if symptoms worsen or fail to improve   I connected with  [name] on 02/11/21 by a video enabled telemedicine application and verified that I am speaking with the correct person using two identifiers.   I discussed the limitations of evaluation and management by telemedicine. The patient expressed understanding and agreed to proceed.   Spent 22 minutes of face to face with patient and daughters  >50% time spent counseling; reviewing medical record; tests; labs; and developing future plan of care.  Caesar Bookman, NP

## 2021-04-06 IMAGING — CT CT ABD-PELV W/O CM
3 of 5 series · 16 of 46 positions shown, 18 images · non-contrast
Comparison: 03/21/2018

CLINICAL DATA: Brief syncopal episode at home today. Generalized
weakness, fatigue and constipation.

EXAM:
CT ABDOMEN AND PELVIS WITHOUT CONTRAST
TECHNIQUE: Multidetector CT imaging of the abdomen and pelvis was performed
following the standard protocol without IV contrast.

[Series 3: ap without · axial · non-contrast · 0.78mm/px · z∈[-1015,-655]mm · 11 of 88 slices shown, 13 images]
[im 8/88  soft-tissue]
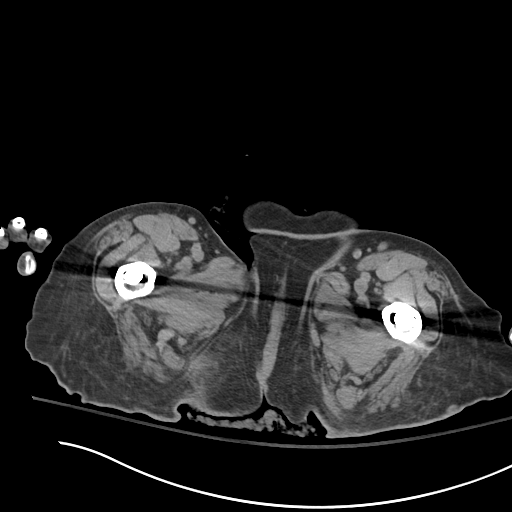
[im 8/88  bone]
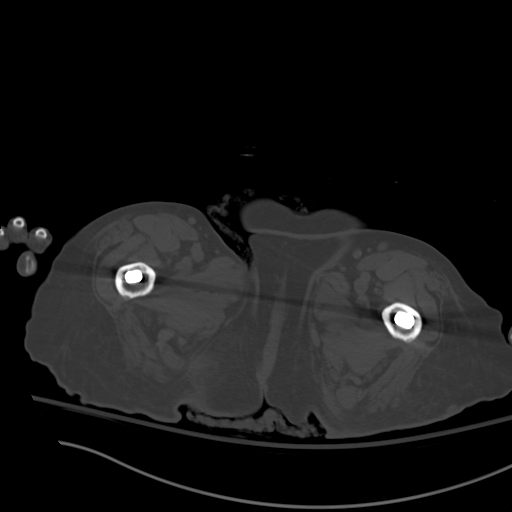
[im 15/88  soft-tissue]
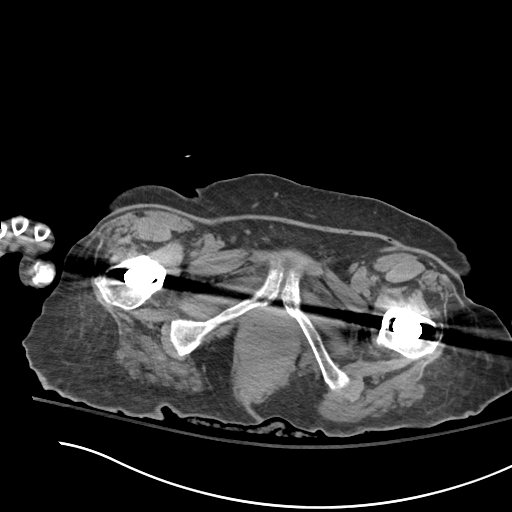
[im 22/88  soft-tissue]
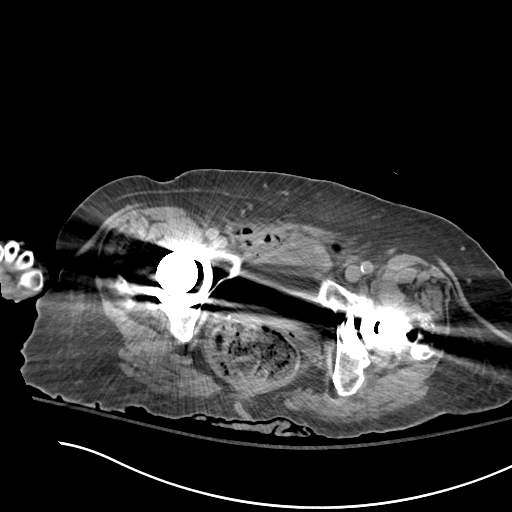
[im 30/88  soft-tissue]
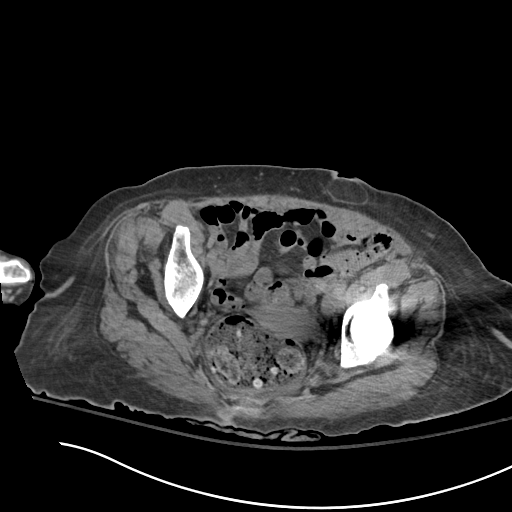
[im 37/88  soft-tissue]
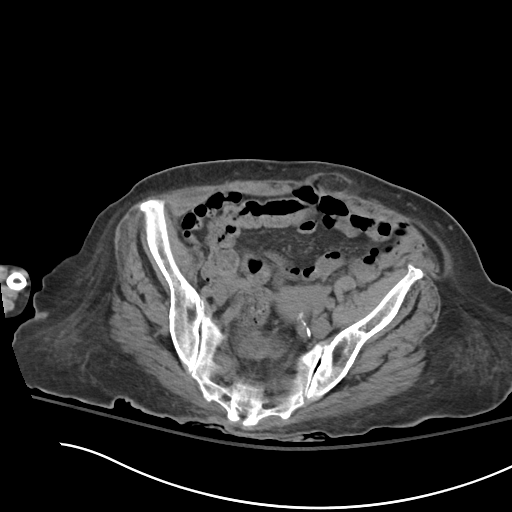
[im 44/88  soft-tissue]
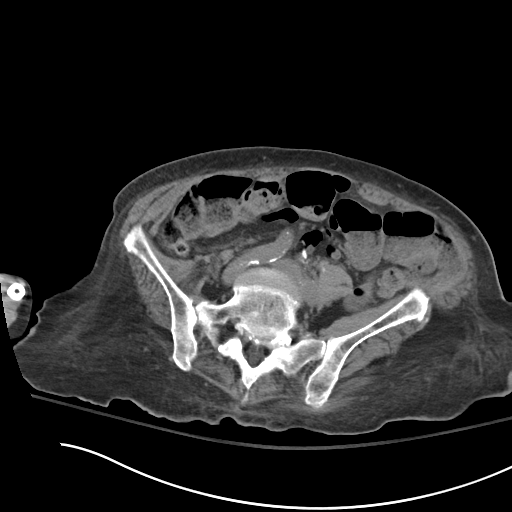
[im 51/88  soft-tissue]
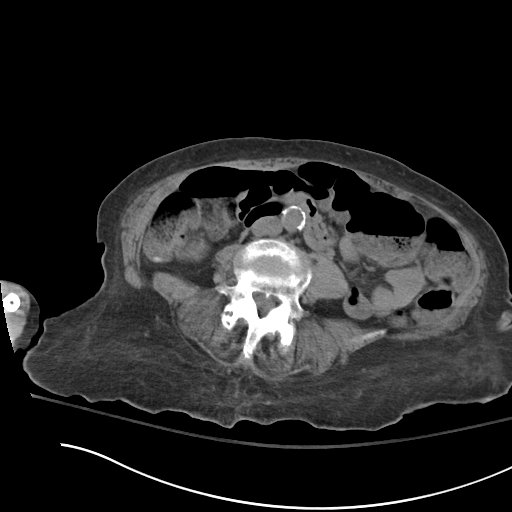
[im 59/88  soft-tissue]
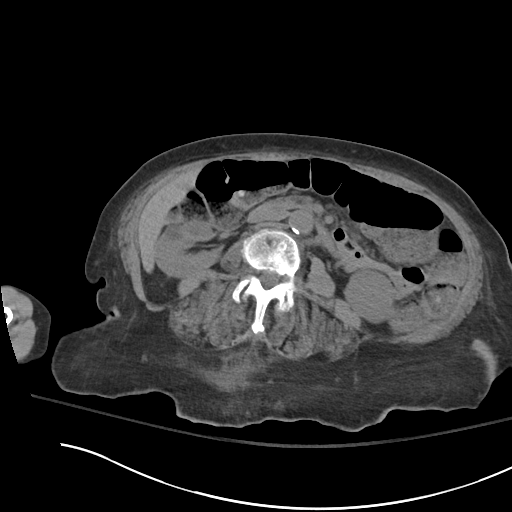
[im 66/88  soft-tissue]
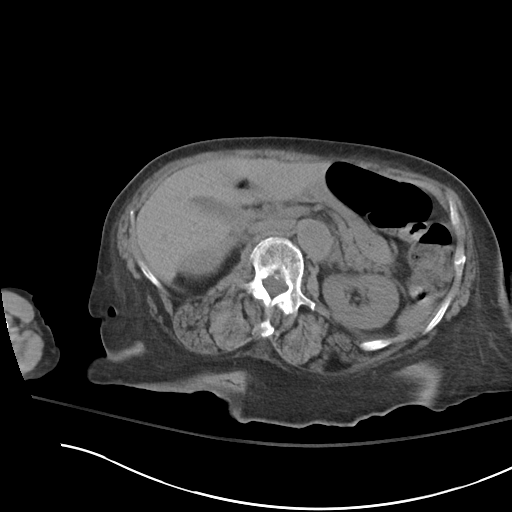
[im 66/88  bone]
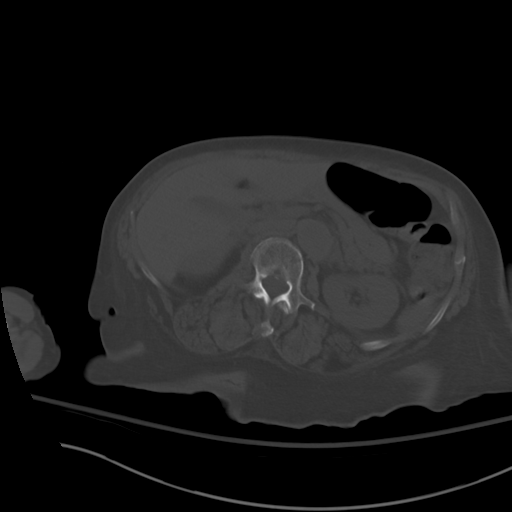
[im 73/88  soft-tissue]
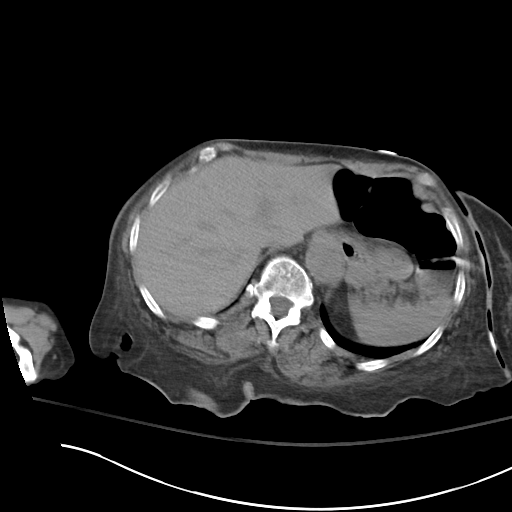
[im 80/88  soft-tissue]
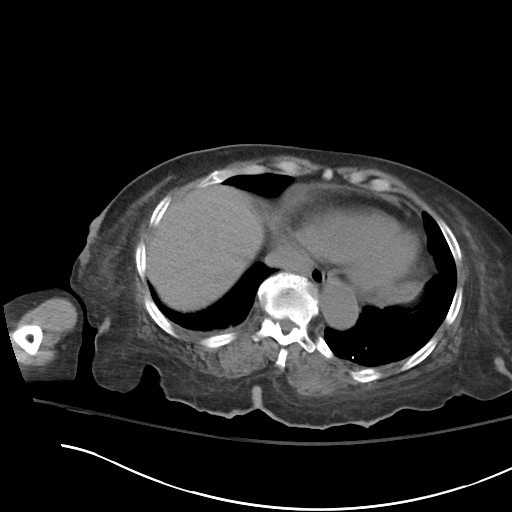

[Series 5: lungs · axial · 0.78mm/px · z∈[-681,-665]mm · 2 of 42 slices shown]
[im 9/42  soft-tissue]
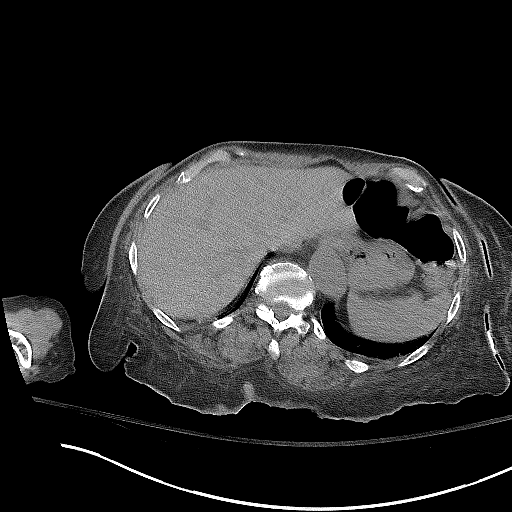
[im 17/42  soft-tissue]
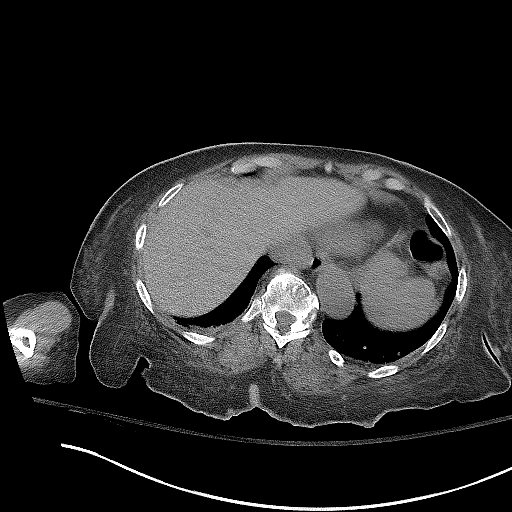

[Series 6: cor · coronal · 0.71mm/px · 3 of 84 slices shown]
[im 28/84  soft-tissue]
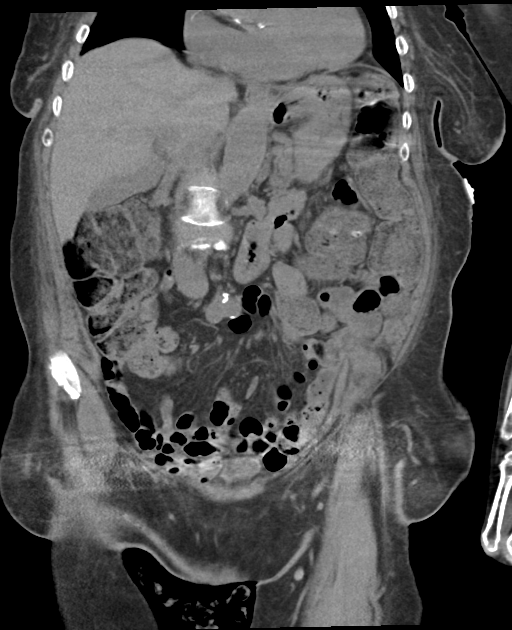
[im 37/84  soft-tissue]
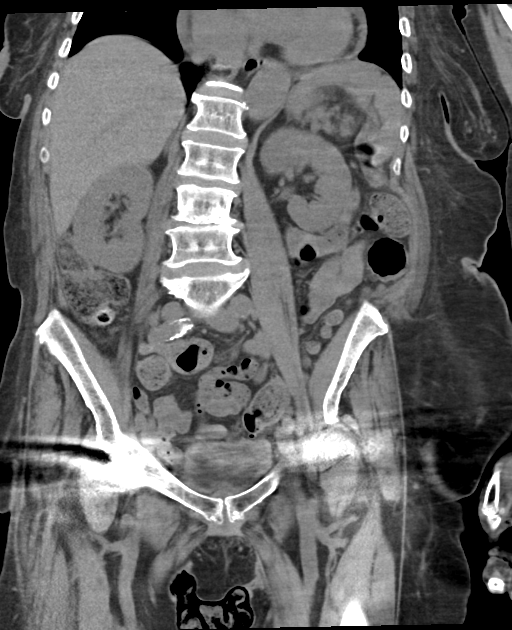
[im 47/84  soft-tissue]
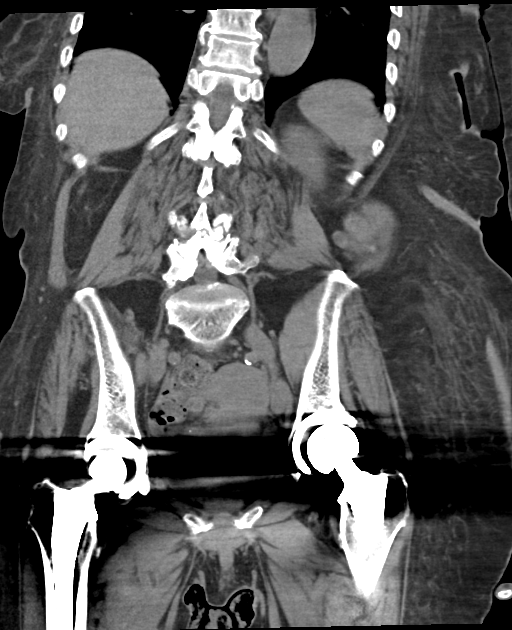

[16 of 46 positions shown; findings below may reference images not displayed]

FINDINGS: Lower chest: Heart normal in size. Small pericardial effusion. Clear
lung bases.

Hepatobiliary: No focal liver abnormality is seen. No gallstones,
gallbladder wall thickening, or biliary dilatation.

Pancreas: Unremarkable. No pancreatic ductal dilatation or
surrounding inflammatory changes.

Spleen: Normal in size without focal abnormality.

Adrenals/Urinary Tract: No adrenal masses. Small mid and lower pole
low-density right renal masses, largest 1.7 cm, consistent with
cysts. No other renal masses, no stones and no hydronephrosis.
Normal ureters. Bladder mildly distended and partly obscured by
bilateral hip arthroplasty artifact. No gross bladder abnormality.

Stomach/Bowel: Stomach is decompressed, otherwise unremarkable.
Small bowel is normal in caliber. No wall thickening. No
inflammation. Colon and rectum or moderately distended with stool.
No wall thickening. No inflammation. Several scattered colonic
diverticula are noted. No evidence of appendicitis.

Vascular/Lymphatic: Aortic atherosclerosis. No aneurysm. No enlarged
lymph nodes.

Reproductive: Uterus and bilateral adnexa are unremarkable.

Other: Small fat containing umbilical hernia.  No ascites.

Musculoskeletal: Mild chronic appearing compression deformity of
T11. No evidence of an acute fracture. Bilateral total hip
arthroplasties appear well seated and well aligned. There are
degenerative changes of the visualized spine. No osteoblastic or
osteolytic lesions.
IMPRESSION: 1. No acute findings. No evidence of diverticulitis or other bowel
inflammatory process. No bowel obstruction.
2. Moderate increased colonic and rectal stool.
3. Aortic atherosclerosis.

## 2021-04-25 ENCOUNTER — Telehealth: Payer: Self-pay | Admitting: Neurology

## 2021-04-25 NOTE — Telephone Encounter (Signed)
Carmell Austria, daughter requesting to switch to VV since she is pretty much bedbound, pls confirm VV info.  Clarise Cruz, I will see her on video as wekk after you talk to them. Thank you!

## 2021-04-25 NOTE — Telephone Encounter (Signed)
Spoke to daughter [name]. Mother is not mobile anymore, Hospice is not involved. Nurse comes twice a week, CNA comes three times a week, social worker comes every 3 weeks. She had a severe case of UTI and was in hospital for 5 days. Family did not want SNF so they decided to bring her home. Family is needing guidance to let them know what stage she is in, does not talk very often now. Still recognizes family during the holidays. Not walking anymore. Not feeding herself, she is able to swallow but food is pureed. Only meds right now is Senokot and Tramadol. Discussed that she is now at severe stage of dementia. Continue with hospice care.

## 2021-04-25 NOTE — Telephone Encounter (Signed)
Patients daughter wants a call from [place]. She knows her mother has an appt 1/18 with [facility], but she wants to stay with [facility]. Her mother is no longer mobile so there is no way to get her to the office. She has some things she needs to talk with [facility] about.

## 2021-04-30 ENCOUNTER — Other Ambulatory Visit: Payer: Self-pay

## 2021-04-30 ENCOUNTER — Telehealth (INDEPENDENT_AMBULATORY_CARE_PROVIDER_SITE_OTHER): Admitting: Physician Assistant

## 2021-04-30 DIAGNOSIS — F03918 Unspecified dementia, unspecified severity, with other behavioral disturbance: Secondary | ICD-10-CM

## 2021-04-30 NOTE — Progress Notes (Signed)
Virtual Visit via Video Note The purpose of this virtual visit is to provide medical care while limiting exposure to the novel coronavirus.    Consent was obtained for video visit:  Yes.   Answered questions that patient had about telehealth interaction:  Yes.   I discussed the limitations, risks, security and privacy concerns of performing an evaluation and management service by telemedicine. I also discussed with the patient that there may be a patient responsible charge related to this service. The patient expressed understanding and agreed to proceed.  Pt location: Home Physician Location: office Name of referring provider:  Yvonna Alanis, NP I connected with Merry Lofty at patients initiation/request on 04/30/2021 at  9:00 AM EST by video enabled telemedicine application and verified that I am speaking with the correct person using two identifiers. Pt MRN:  [id number] Pt DOB:  01/30/1941 Video Participants:  Merry Lofty;  Daughter Angelita   History of Present Illness:  This is a 81 yo RH woman with a history of hypertension, hyperlipidemia, uterine prolapse, with severe dementia, likely due to Alzheimer's disease. MRI brain showed mild diffuse atrophy, minimal chronic microvascular disease. Donepezil was discontinued due to bradycardia/hypotension.  Her daughter reports that the patient is not mobile anymore.  She has a nurse that comes twice a week, CNA coming 3 times a week, as well as Hospice now involved in her care.  The family did not want SNF after being in the hospital for 5 days with a severe case of UTI, decided to bring her home where she is comfortable in her surroundings.  She still recognizes some family members especially during the holiday.  She tries to hold a cup, but cannot feed herself, needing full assistance with this.  She is able to swallow food that is pured.  She only takes 2 medications, including Senokot and tramadol.  Overall, her daughter reports that she  is very comfortable, and there are no new acute issues with her.  Her eyes remain closed throughout the whole visit, but her daughter states that this may be "because is the morning ".  She sleeps well at night, occasionally she gets irritated if they are trying to move her to clean her bedsores especially in the back right hip and heel.  Daughter wants to have her monitored by [place] "once in a while ".  All the questions have been addressed and answered to her satisfaction.  Her daughter understands the severity of her stage, and that at this time, is very important to understand the irreversible causes of memory loss due to her advanced stage.  Daughter wants to have an appointment with Dr. Delice Lesch to keep her posted in the next 2 or 3 months.  History on Initial Assessment 07/04/2019: This is a 81 year old right-handed woman with a history of hypertension, hyperlipidemia, uterine prolapse, presenting for evaluation of memory loss. She feels her memory "sometimes comes and goes, especially when doing something." Her daughter Janace Hoard is present during the visit and reports memory changes worsened around 6 months ago. Janace Hoard has lived with her since 2005. Around 6 months ago, [name] started noticing that she was not taking her medications correctly, the pharmacy notified her that something was going on, she was refilling medications too early. [name] started managing medications at that time, and also started taking over with finances because she would forget her zip code or information on cashier checks. The patient reports that she would write a check and  get nervous, she would miss one number. She stopped driving 3 months ago, family have not let her drive due to the pandemic, but also have been concerned about accidents. She denies getting lost driving. She misplaces things frequently. She stopped cooking 2-3 months ago when she put a pot in the microwave. She is able to bathe and dress herself, but [name] had noted some  hygiene issues with the uterine prolapse, she was not washing fully and there would be a smell. One time she had different clothes on with a sock on her head, ready to go somewhere. She is able to do the laundry and dishes but has a harder time following instructions. [name] has noticed personality changes, she is more irritable and defiant, when upset talks very loudly. There may be some hallucinations/delusions, she would say there is something in the couch and she feels it when sitting on it. Her 2 sisters have dementia. No history of significant head injuries or alcohol use.    She has occasional numbness in her right thumb. Her right heel hurts and feels numb. She has constipation and urinary incontinence, catheter recently taken out after procedure for uterine prolapse. She has rare hand tremors when upset. She denies any headaches, dizziness, vision changes, neck/back pain, anosmia. Sleep is good. No falls.     Current Outpatient Medications on File Prior to Visit  Medication Sig Dispense Refill   acetaminophen (TYLENOL) 500 MG tablet Take 500 mg by mouth every 6 (six) hours as needed for headache (painb).     aspirin 81 MG EC tablet Chew 81 mg by mouth daily.     hydrOXYzine (ATARAX/VISTARIL) 25 MG tablet Take 1 tablet (25 mg total) by mouth 3 (three) times daily as needed. 90 tablet 5   midodrine (PROAMATINE) 10 MG tablet TAKE 1 TABLET BY MOUTH TWICE A DAY WITH MEALS 60 tablet 5   mirtazapine (REMERON) 15 MG tablet Take 1 tablet (15 mg total) by mouth at bedtime. 30 tablet 5   Multiple Vitamins-Minerals (CENTRUM ADULTS PO) Take 1 tablet by mouth daily.     nutrition supplement, JUVEN, (JUVEN) PACK Take 1 packet by mouth every other day. Alternate with pedialyte     Pedialyte (PEDIALYTE) SOLN Take 120 mLs by mouth every other day. Alternate with Juven     polyethylene glycol powder (GLYCOLAX/MIRALAX) 17 GM/SCOOP powder Mix 1 capful of powder in drink and take by mouth one to three times daily  as needed for daily soft stools OTC 225 g 0   sennosides-docusate sodium (SENOKOT-S) 8.6-50 MG tablet Take 2 tablets by mouth daily. 60 tablet 2   sodium phosphate (FLEET) 7-19 GM/118ML ENEM Place 1 enema rectally once as needed for severe constipation.     trimethoprim (TRIMPEX) 100 MG tablet Take 1 tablet (100 mg total) by mouth daily. 30 tablet 5   No current facility-administered medications on file prior to visit.     Observations/Objective:   There were no vitals filed for this visit. GEN:  The patient appears stated age and is in NAD.  Neurological examination: Patient is awake, but eyes are closed, she does not open for voice commands.  Unable to test aphasia, dysarthria, fluency, memory or comprehension unable to test cranial nerves   Assessment and Plan:    Severe dementia with behavioral disturbance There is progression of the disease.  Patient is now in hospice, as well as having nursing care throughout the day along with family support.  All questions were answered.  Follow-up in 2 or 3 months via video visit     Follow Up Instructions:    -I discussed the assessment and treatment plan with the patient. The patient was provided an opportunity to ask questions and all were answered. The patient agreed with the plan and demonstrated an understanding of the instructions.   The patient was advised to call back or seek an in-person evaluation if the symptoms worsen or if the condition fails to improve as anticipated.    Total time spent on today's visit was 32:14 minutes, including both face-to-face time and nonface-to-face time.  Time included that spent on review of records (prior notes available to me/labs/imaging if pertinent), discussing treatment and goals, answering patient's questions and coordinating care.   Sharene Butters, PA-C

## 2021-05-15 ENCOUNTER — Other Ambulatory Visit: Payer: MEDICARE

## 2021-05-15 ENCOUNTER — Ambulatory Visit: Payer: MEDICARE | Admitting: Orthopedic Surgery

## 2021-10-28 ENCOUNTER — Ambulatory Visit: Payer: MEDICARE | Admitting: Neurology

## 2021-12-12 DEATH — deceased

## 2022-05-21 ENCOUNTER — Telehealth: Payer: MEDICARE | Admitting: Neurology
